# Patient Record
Sex: Female | Born: 1935 | Race: Black or African American | Hispanic: No | Marital: Married
Health system: Southern US, Community
[De-identification: ages and names within clinical notes are randomized; demographics above are authoritative.]

## PROBLEM LIST (undated history)

## (undated) DIAGNOSIS — F32A Depression, unspecified: Secondary | ICD-10-CM

## (undated) DIAGNOSIS — M549 Dorsalgia, unspecified: Secondary | ICD-10-CM

## (undated) DIAGNOSIS — Z923 Personal history of irradiation: Secondary | ICD-10-CM

## (undated) DIAGNOSIS — F329 Major depressive disorder, single episode, unspecified: Secondary | ICD-10-CM

## (undated) DIAGNOSIS — C801 Malignant (primary) neoplasm, unspecified: Secondary | ICD-10-CM

## (undated) DIAGNOSIS — I272 Pulmonary hypertension, unspecified: Secondary | ICD-10-CM

## (undated) DIAGNOSIS — M1990 Unspecified osteoarthritis, unspecified site: Secondary | ICD-10-CM

## (undated) DIAGNOSIS — F419 Anxiety disorder, unspecified: Secondary | ICD-10-CM

## (undated) DIAGNOSIS — I509 Heart failure, unspecified: Secondary | ICD-10-CM

## (undated) DIAGNOSIS — R5383 Other fatigue: Secondary | ICD-10-CM

## (undated) DIAGNOSIS — Z8639 Personal history of other endocrine, nutritional and metabolic disease: Secondary | ICD-10-CM

## (undated) DIAGNOSIS — I10 Essential (primary) hypertension: Secondary | ICD-10-CM

## (undated) DIAGNOSIS — G4733 Obstructive sleep apnea (adult) (pediatric): Secondary | ICD-10-CM

## (undated) DIAGNOSIS — G4700 Insomnia, unspecified: Secondary | ICD-10-CM

## (undated) HISTORY — PX: FOOT SURGERY: SHX648

## (undated) HISTORY — PX: TONSILLECTOMY: SUR1361

## (undated) HISTORY — PX: EYE SURGERY: SHX253

## (undated) HISTORY — PX: BREAST LUMPECTOMY: SHX2

## (undated) HISTORY — PX: DILATION AND CURETTAGE OF UTERUS: SHX78

## (undated) HISTORY — PX: COLONOSCOPY: SHX174

---

## 1999-01-30 ENCOUNTER — Ambulatory Visit (HOSPITAL_COMMUNITY): Admission: RE | Admit: 1999-01-30 | Discharge: 1999-01-30 | Payer: Self-pay | Admitting: *Deleted

## 1999-01-31 ENCOUNTER — Encounter: Admission: RE | Admit: 1999-01-31 | Discharge: 1999-05-01 | Payer: Self-pay | Admitting: *Deleted

## 2001-06-24 ENCOUNTER — Ambulatory Visit (HOSPITAL_COMMUNITY): Admission: RE | Admit: 2001-06-24 | Discharge: 2001-06-24 | Payer: Self-pay | Admitting: Family Medicine

## 2001-06-24 ENCOUNTER — Encounter: Payer: Self-pay | Admitting: Family Medicine

## 2001-06-30 ENCOUNTER — Ambulatory Visit (HOSPITAL_COMMUNITY): Admission: RE | Admit: 2001-06-30 | Discharge: 2001-06-30 | Payer: Self-pay | Admitting: Family Medicine

## 2001-06-30 ENCOUNTER — Encounter: Payer: Self-pay | Admitting: Family Medicine

## 2001-08-09 ENCOUNTER — Other Ambulatory Visit: Admission: RE | Admit: 2001-08-09 | Discharge: 2001-08-09 | Payer: Self-pay | Admitting: Obstetrics and Gynecology

## 2002-01-03 ENCOUNTER — Encounter: Admission: RE | Admit: 2002-01-03 | Discharge: 2002-01-03 | Payer: Self-pay | Admitting: Oncology

## 2002-07-03 ENCOUNTER — Encounter: Payer: Self-pay | Admitting: Family Medicine

## 2002-07-03 ENCOUNTER — Ambulatory Visit (HOSPITAL_COMMUNITY): Admission: RE | Admit: 2002-07-03 | Discharge: 2002-07-03 | Payer: Self-pay | Admitting: Family Medicine

## 2003-01-03 ENCOUNTER — Encounter: Admission: RE | Admit: 2003-01-03 | Discharge: 2003-01-03 | Payer: Self-pay | Admitting: Oncology

## 2003-07-05 ENCOUNTER — Ambulatory Visit (HOSPITAL_COMMUNITY): Admission: RE | Admit: 2003-07-05 | Discharge: 2003-07-05 | Payer: Self-pay | Admitting: Family Medicine

## 2003-07-05 ENCOUNTER — Encounter: Payer: Self-pay | Admitting: Family Medicine

## 2004-01-04 ENCOUNTER — Encounter: Admission: RE | Admit: 2004-01-04 | Discharge: 2004-01-04 | Payer: Self-pay | Admitting: Oncology

## 2004-01-04 ENCOUNTER — Encounter (HOSPITAL_COMMUNITY): Admission: RE | Admit: 2004-01-04 | Discharge: 2004-02-03 | Payer: Self-pay | Admitting: Oncology

## 2004-07-09 ENCOUNTER — Ambulatory Visit (HOSPITAL_COMMUNITY): Admission: RE | Admit: 2004-07-09 | Discharge: 2004-07-09 | Payer: Self-pay | Admitting: Family Medicine

## 2005-01-05 ENCOUNTER — Encounter (HOSPITAL_COMMUNITY): Admission: RE | Admit: 2005-01-05 | Discharge: 2005-02-04 | Payer: Self-pay | Admitting: Oncology

## 2005-01-05 ENCOUNTER — Encounter: Admission: RE | Admit: 2005-01-05 | Discharge: 2005-01-05 | Payer: Self-pay | Admitting: Oncology

## 2005-01-05 ENCOUNTER — Ambulatory Visit (HOSPITAL_COMMUNITY): Payer: Self-pay | Admitting: Oncology

## 2005-07-15 ENCOUNTER — Ambulatory Visit (HOSPITAL_COMMUNITY): Admission: RE | Admit: 2005-07-15 | Discharge: 2005-07-15 | Payer: Self-pay | Admitting: Family Medicine

## 2005-11-17 ENCOUNTER — Ambulatory Visit (HOSPITAL_COMMUNITY): Admission: RE | Admit: 2005-11-17 | Discharge: 2005-11-17 | Payer: Self-pay | Admitting: Ophthalmology

## 2005-12-01 ENCOUNTER — Ambulatory Visit (HOSPITAL_COMMUNITY): Admission: RE | Admit: 2005-12-01 | Discharge: 2005-12-01 | Payer: Self-pay | Admitting: Ophthalmology

## 2005-12-03 ENCOUNTER — Ambulatory Visit (HOSPITAL_COMMUNITY): Admission: RE | Admit: 2005-12-03 | Discharge: 2005-12-03 | Payer: Self-pay | Admitting: General Surgery

## 2006-01-05 ENCOUNTER — Encounter: Admission: RE | Admit: 2006-01-05 | Discharge: 2006-01-05 | Payer: Self-pay | Admitting: Oncology

## 2006-01-05 ENCOUNTER — Ambulatory Visit (HOSPITAL_COMMUNITY): Payer: Self-pay | Admitting: Oncology

## 2006-07-21 ENCOUNTER — Ambulatory Visit (HOSPITAL_COMMUNITY): Admission: RE | Admit: 2006-07-21 | Discharge: 2006-07-21 | Payer: Self-pay | Admitting: Family Medicine

## 2007-01-04 ENCOUNTER — Ambulatory Visit (HOSPITAL_COMMUNITY): Payer: Self-pay | Admitting: Oncology

## 2007-07-27 ENCOUNTER — Ambulatory Visit (HOSPITAL_COMMUNITY): Admission: RE | Admit: 2007-07-27 | Discharge: 2007-07-27 | Payer: Self-pay | Admitting: Obstetrics and Gynecology

## 2008-01-06 ENCOUNTER — Ambulatory Visit (HOSPITAL_COMMUNITY): Payer: Self-pay | Admitting: Oncology

## 2008-07-27 ENCOUNTER — Ambulatory Visit (HOSPITAL_COMMUNITY): Admission: RE | Admit: 2008-07-27 | Discharge: 2008-07-27 | Payer: Self-pay | Admitting: Internal Medicine

## 2008-08-21 ENCOUNTER — Other Ambulatory Visit: Admission: RE | Admit: 2008-08-21 | Discharge: 2008-08-21 | Payer: Self-pay | Admitting: Obstetrics and Gynecology

## 2009-01-04 ENCOUNTER — Ambulatory Visit (HOSPITAL_COMMUNITY): Payer: Self-pay | Admitting: Oncology

## 2009-07-23 ENCOUNTER — Emergency Department (HOSPITAL_COMMUNITY): Admission: EM | Admit: 2009-07-23 | Discharge: 2009-07-23 | Payer: Self-pay | Admitting: Emergency Medicine

## 2009-07-31 ENCOUNTER — Ambulatory Visit (HOSPITAL_COMMUNITY): Admission: RE | Admit: 2009-07-31 | Discharge: 2009-07-31 | Payer: Self-pay | Admitting: Family Medicine

## 2009-11-26 ENCOUNTER — Ambulatory Visit (HOSPITAL_COMMUNITY): Admission: RE | Admit: 2009-11-26 | Discharge: 2009-11-26 | Payer: Self-pay | Admitting: Internal Medicine

## 2009-11-26 ENCOUNTER — Encounter: Payer: Self-pay | Admitting: Orthopedic Surgery

## 2009-12-23 ENCOUNTER — Ambulatory Visit: Payer: Self-pay | Admitting: Orthopedic Surgery

## 2009-12-23 DIAGNOSIS — Z8679 Personal history of other diseases of the circulatory system: Secondary | ICD-10-CM

## 2009-12-23 DIAGNOSIS — M19019 Primary osteoarthritis, unspecified shoulder: Secondary | ICD-10-CM | POA: Insufficient documentation

## 2009-12-23 DIAGNOSIS — M7580 Other shoulder lesions, unspecified shoulder: Secondary | ICD-10-CM

## 2010-08-04 ENCOUNTER — Ambulatory Visit (HOSPITAL_COMMUNITY): Admission: RE | Admit: 2010-08-04 | Discharge: 2010-08-04 | Payer: Self-pay | Admitting: Internal Medicine

## 2010-10-22 ENCOUNTER — Other Ambulatory Visit
Admission: RE | Admit: 2010-10-22 | Discharge: 2010-10-22 | Payer: Self-pay | Source: Home / Self Care | Admitting: Obstetrics and Gynecology

## 2010-12-31 ENCOUNTER — Ambulatory Visit (HOSPITAL_COMMUNITY)
Admission: RE | Admit: 2010-12-31 | Discharge: 2010-12-31 | Payer: Self-pay | Source: Home / Self Care | Attending: Preventative Medicine | Admitting: Preventative Medicine

## 2011-01-20 NOTE — Letter (Signed)
Summary: History form  History form   Imported By: Jacklynn Ganong 12/26/2009 08:18:42  _____________________________________________________________________  External Attachment:    Type:   Image     Comment:   External Document

## 2011-01-20 NOTE — Assessment & Plan Note (Signed)
Summary: RT SHOULDER PAIN/XRAY [facility] 11/26/09/REF [facility]/MEDICARE,[employer]   Vital Signs:  Patient profile:   77 year old female Weight:      144 pounds Pulse rate:   70 / minute Resp:     18 per minute  Vitals Entered By: Fuller Canada MD (December 23, 2009 9:56 AM)  Visit Type:  Initial   Referring Provider:  Dr Felecia Shelling  Primary Provider:  Dr Felecia Shelling   CC:  right shoulder pain x 1 month .  History of Present Illness: I saw Charlotte [name] in the office today for an initial visit.  She is a 77 years old woman with the complaint of:  chief complaint:  Right shoulder pain   pain -duration a month, no injury.  -location anterior shoulder to the deltoid.  -severity [1-10]  9  -worsened by: immobility, not moving.  -improved by:  movement, Tramadol, Tylenol arthritis helps.  -quality: achy pain.  -other symptoms stiffness of the shoulder in the am, no numbness, no catchin gor locking of the shoulder, no weakness of the arm, no popping of the shoulder. ROM is good some pain with raising arm.  -xrays done & where: [facility] 11/26/09 right shoulder.  No injections or therapy for the shoulder.  Meds: Lsinopril, Felodipine, Tramadol, Triamterene/HCTZ, Restasis, Caltrate plus D, Centrum Silver, Fish oil, Aspirin, Zyrtec, Systane.  Allergies (verified): 1)  ! * Bextra 2)  ! Motrin  Past History:  Past Medical History: High blood pressure Shoulder pain  Past Surgical History: Tonsillectomy DNC Lumpectomy right breast Foot surgery Cataract Extraction Colonoscopy  Family History: FH of Cancer:  Family History Coronary Heart Disease female < 17  Social History: Patient is married.  retired Runner, broadcasting/film/video no smoking no alcohol no caffeine  Review of Systems General:  Denies weight loss, weight gain, fever, chills, and fatigue. Cardiac :  Denies chest pain, angina, heart attack, heart failure, poor circulation, blood clots, and phlebitis. Resp:  Complains of cough; denies short of  breath, difficulty breathing, COPD, and pneumonia. GI:  Complains of constipation; denies nausea, vomiting, diarrhea, difficulty swallowing, ulcers, GERD, and reflux. GU:  Denies kidney failure, kidney transplant, kidney stones, burning, poor stream, testicular cancer, blood in urine, and . Neuro:  Complains of headache; denies dizziness, migraines, numbness, weakness, tremor, and unsteady walking. MS:  Complains of joint pain; denies rheumatoid arthritis, joint swelling, gout, bone cancer, osteoporosis, and . Endo:  Complains of thyroid disease; denies goiter and diabetes. Psych:  Denies depression, mood swings, anxiety, panic attack, bipolar, and schizophrenia. Derm:  Denies eczema, cancer, and itching. EENT:  Complains of poor vision and cataracts; denies glaucoma, poor hearing, vertigo, ears ringing, sinusitis, hoarseness, toothaches, and bleeding gums. Immunology:  Complains of seasonal allergies; denies sinus problems and allergic to bee stings. Lymphatic:  Denies lymph node cancer and lymph edema.   Shoulder/Elbow Exam  General:    Well-developed, well-nourished, normal body habitus; no deformities, normal grooming.    Skin:    Intact, no scars, lesions, rashes, cafe au lait spots or bruising:  arms, neck and back   Inspection:    tenderness over the [facility] joint of the right shoulder and the coracoid   no tenderness over the left shoulder [did have deltoid soreness]  neck non tender   Palpation:    as above   Vascular:    Radial, ulnar, brachial, and axillary pulses 2+ and symmetric; capillary refill less than 2 seconds; no evidence of ischemia, clubbing, or cyanosis.    Sensory:  normal sensation in the upper extremities   Motor:    Normal strength in the upper extremities.    Reflexes:    Normal reflexes in the upper extremities.    Shoulder Exam:    Right:    Inspection:  Abnormal    Palpation:  Abnormal    Stability:  stable    Tenderness:  no    Swelling:   no    Erythema:  no    Range of Motion:       Flexion-Active: 180       Extension-Active: 45       Flexion-Passive: 180       Extension-Passive: 45       External Rotation : 45       Interior Rotation : T7    Left:    Inspection:  Normal    Palpation:  Normal    Stability:  stable    Tenderness:  no    Swelling:  no    Erythema:  no    Range of Motion:       Flexion-Active: 180       Extension-Active: 45       Flexion-Passive: 180       Extension-Passive: 45       External Rotation : 45       Interior Rotation : T7  Impingement Sign NEER:    Right positive; Left negative Impingement Sign HAWKINS:    Right positive Apprehension Sign:    Right negative; Left negative AC joint Adduction Test:    Right negative; Left negative   Impression & Recommendations:  Problem # 1:  IMPINGEMENT SYNDROME (ICD-726.2) Assessment New  Right shoulder: Verbal consent obtained/The shoulder was injected with depomedrol 40mg /cc and sensorcaine .25% . There were no complications  HEP Codman's   Orders: New Patient Level III (66440) Joint Aspirate / Injection, Large (20610) Depo- Medrol 40mg  (J1030)  Problem # 2:  SHOULDER, ARTHRITIS, DEGEN./OSTEO (ICD-715.91) Assessment: New  AC JOINT OA on xrays from 11/26/2009  Orders: New Patient Level III (34742)  Patient Instructions: 1)  You have received an injection of cortisone today. You may experience increased pain at the injection site. Apply ice pack to the area for 20 minutes every 2 hours and take 2 xtra strength tylenol every 8 hours. This increased pain will usually resolve in 24 hours. The injection will take effect in 3-10 days.  2)  Please schedule a follow-up appointment as needed.

## 2011-01-21 ENCOUNTER — Other Ambulatory Visit (HOSPITAL_COMMUNITY): Payer: Self-pay | Admitting: Preventative Medicine

## 2011-01-21 DIAGNOSIS — M5416 Radiculopathy, lumbar region: Secondary | ICD-10-CM

## 2011-01-22 ENCOUNTER — Ambulatory Visit (HOSPITAL_COMMUNITY)
Admission: RE | Admit: 2011-01-22 | Discharge: 2011-01-22 | Disposition: A | Discharge status: Home / Self Care | Payer: Medicare Other | Source: Ambulatory Visit | Attending: Preventative Medicine | Admitting: Preventative Medicine

## 2011-01-22 DIAGNOSIS — M79609 Pain in unspecified limb: Secondary | ICD-10-CM | POA: Insufficient documentation

## 2011-01-22 DIAGNOSIS — M5124 Other intervertebral disc displacement, thoracic region: Secondary | ICD-10-CM | POA: Insufficient documentation

## 2011-01-22 DIAGNOSIS — M5416 Radiculopathy, lumbar region: Secondary | ICD-10-CM

## 2011-01-22 DIAGNOSIS — M5126 Other intervertebral disc displacement, lumbar region: Secondary | ICD-10-CM | POA: Insufficient documentation

## 2011-01-22 DIAGNOSIS — M545 Low back pain, unspecified: Secondary | ICD-10-CM | POA: Insufficient documentation

## 2011-01-22 DIAGNOSIS — M25559 Pain in unspecified hip: Secondary | ICD-10-CM | POA: Insufficient documentation

## 2011-01-28 ENCOUNTER — Ambulatory Visit (HOSPITAL_COMMUNITY)
Admission: RE | Admit: 2011-01-28 | Discharge: 2011-01-28 | Disposition: A | Discharge status: Home / Self Care | Payer: Medicare Other | Source: Ambulatory Visit | Attending: Physical Therapy | Admitting: Physical Therapy

## 2011-01-28 DIAGNOSIS — M6281 Muscle weakness (generalized): Secondary | ICD-10-CM | POA: Insufficient documentation

## 2011-01-28 DIAGNOSIS — I10 Essential (primary) hypertension: Secondary | ICD-10-CM | POA: Insufficient documentation

## 2011-01-28 DIAGNOSIS — M545 Low back pain, unspecified: Secondary | ICD-10-CM | POA: Insufficient documentation

## 2011-01-28 DIAGNOSIS — R262 Difficulty in walking, not elsewhere classified: Secondary | ICD-10-CM | POA: Insufficient documentation

## 2011-01-28 DIAGNOSIS — IMO0001 Reserved for inherently not codable concepts without codable children: Secondary | ICD-10-CM | POA: Insufficient documentation

## 2011-01-30 ENCOUNTER — Ambulatory Visit (HOSPITAL_COMMUNITY)
Admission: RE | Admit: 2011-01-30 | Discharge: 2011-01-30 | Disposition: A | Discharge status: Home / Self Care | Payer: Medicare Other | Source: Ambulatory Visit | Attending: Orthopedic Surgery | Admitting: Orthopedic Surgery

## 2011-01-30 DIAGNOSIS — M6281 Muscle weakness (generalized): Secondary | ICD-10-CM | POA: Insufficient documentation

## 2011-01-30 DIAGNOSIS — I10 Essential (primary) hypertension: Secondary | ICD-10-CM | POA: Insufficient documentation

## 2011-01-30 DIAGNOSIS — R262 Difficulty in walking, not elsewhere classified: Secondary | ICD-10-CM | POA: Insufficient documentation

## 2011-01-30 DIAGNOSIS — IMO0001 Reserved for inherently not codable concepts without codable children: Secondary | ICD-10-CM | POA: Insufficient documentation

## 2011-02-03 ENCOUNTER — Ambulatory Visit (HOSPITAL_COMMUNITY): Payer: Medicare Other | Admitting: Physical Therapy

## 2011-02-05 ENCOUNTER — Ambulatory Visit (HOSPITAL_COMMUNITY)
Admission: RE | Admit: 2011-02-05 | Discharge: 2011-02-05 | Disposition: A | Discharge status: Home / Self Care | Payer: Medicare Other | Source: Ambulatory Visit | Attending: *Deleted | Admitting: *Deleted

## 2011-02-10 ENCOUNTER — Ambulatory Visit (HOSPITAL_COMMUNITY)
Admission: RE | Admit: 2011-02-10 | Discharge: 2011-02-10 | Disposition: A | Discharge status: Home / Self Care | Payer: Medicare Other | Source: Ambulatory Visit | Attending: *Deleted | Admitting: *Deleted

## 2011-02-12 ENCOUNTER — Ambulatory Visit (HOSPITAL_COMMUNITY)
Admission: RE | Admit: 2011-02-12 | Discharge: 2011-02-12 | Disposition: A | Discharge status: Home / Self Care | Payer: Medicare Other | Source: Ambulatory Visit | Attending: *Deleted | Admitting: *Deleted

## 2011-03-28 LAB — BASIC METABOLIC PANEL
Calcium: 9.6 mg/dL (ref 8.4–10.5)
Chloride: 95 mEq/L — ABNORMAL LOW (ref 96–112)
Creatinine, Ser: 1.23 mg/dL — ABNORMAL HIGH (ref 0.4–1.2)
GFR calc Af Amer: 51 mL/min — ABNORMAL LOW (ref >60)

## 2011-03-28 LAB — POCT CARDIAC MARKERS
CKMB, poc: 1.3 ng/mL (ref 1.0–8.0)
CKMB, poc: 1.8 ng/mL (ref 1.0–8.0)
Myoglobin, poc: 86.3 ng/mL (ref 12–200)

## 2011-03-28 LAB — PROTIME-INR: INR: 0.9 (ref 0.00–1.49)

## 2011-03-28 LAB — DIFFERENTIAL
Lymphocytes Relative: 48 % — ABNORMAL HIGH (ref 12–46)
Lymphs Abs: 2.9 10*3/uL (ref 0.7–4.0)
Monocytes Relative: 7 % (ref 3–12)
Neutro Abs: 2.5 10*3/uL (ref 1.7–7.7)
Neutrophils Relative %: 42 % — ABNORMAL LOW (ref 43–77)

## 2011-03-28 LAB — APTT: aPTT: 28 seconds (ref 24–37)

## 2011-03-28 LAB — CBC
RBC: 3.76 MIL/uL — ABNORMAL LOW (ref 3.87–5.11)
WBC: 6 10*3/uL (ref 4.0–10.5)

## 2011-05-08 NOTE — H&P (Signed)
NAME:  Charlotte [name], Charlotte [name]                 ACCOUNT NO.:  [id number]   MEDICAL RECORD NO.:  [id number]          PATIENT TYPE:  AMB   LOCATION:  DAY                           FACILITY:  [facility]   PHYSICIAN:  Jerolyn Shin C. Katrinka Blazing, M.D.   DATE OF BIRTH:  [birth year]   DATE OF ADMISSION:  DATE OF DISCHARGE:  LH                                HISTORY & PHYSICAL   HISTORY OF PRESENT ILLNESS:  The patient is a 77 year old female with a  history of colon polyps, status post polypectomy in 2001.  Her sister has  colonoscopy cancer.  The patient is scheduled for followup colonoscopy and  screening for colon polyps.  She denies any rectal bleeding.  She denies  abdominal pain.  There has been on change in bowel habits.   PAST MEDICAL HISTORY:  1.  Hypertension.  2.  Right breast cancer.  3.  Hyperlipidemia.   MEDICATIONS:  1.  Mexil 55 daily.  2.  Maxzide 75/50 daily.  3.  Daily aspirin 81 mg.  4.  Multivitamins.   PAST SURGICAL HISTORY:  1.  Right partial mastectomy.  2.  Tonsillectomy.  3.  Bilateral bunion surgery.  4.  Bilateral cataract surgery.   ALLERGIES:  BEXTRA which caused a swelling and MOTRIN which causes GI upset.   PHYSICAL EXAMINATION:  VITAL SIGNS:  Blood pressure 160/82, pulse 68,  respirations 20, weight 145 pounds.  HEENT:  Unremarkable.  NECK:  Supple.  No JVD, bruits, adenopathy, or thyromegaly.  CHEST:  Clear to auscultation.  HEART:  Regular rate and rhythm without murmur, gallop or rub.  ABDOMEN:  Soft, nontender.  No masses.  EXTREMITIES:  No cyanosis, clubbing or edema.  NEUROLOGIC:  No focal motor, sensory or cerebellar deficits.   IMPRESSION:  1.  History of multiple colon polyps.  2.  Family history of colon cancer.  3.  Hypertension.  4.  Anemia.   PLAN:  Screening colonoscopy.      Dirk Dress. Katrinka Blazing, M.D.  Electronically Signed     LCS/MEDQ  D:  12/03/2005  T:  12/03/2005  Job:  [id number]   cc:   [facility]  Fax: [phone]

## 2011-07-07 ENCOUNTER — Other Ambulatory Visit (HOSPITAL_COMMUNITY): Payer: Self-pay | Admitting: Internal Medicine

## 2011-07-07 DIAGNOSIS — Z139 Encounter for screening, unspecified: Secondary | ICD-10-CM

## 2011-08-10 ENCOUNTER — Ambulatory Visit (HOSPITAL_COMMUNITY)
Admission: RE | Admit: 2011-08-10 | Discharge: 2011-08-10 | Disposition: A | Discharge status: Home / Self Care | Payer: Medicare Other | Source: Ambulatory Visit | Attending: Internal Medicine | Admitting: Internal Medicine

## 2011-08-10 DIAGNOSIS — Z139 Encounter for screening, unspecified: Secondary | ICD-10-CM

## 2011-08-10 DIAGNOSIS — Z1231 Encounter for screening mammogram for malignant neoplasm of breast: Secondary | ICD-10-CM | POA: Insufficient documentation

## 2011-08-13 NOTE — Op Note (Signed)
  NAME:  Charlotte [name], Charlotte [name]                 ACCOUNT NO.:  [id number]  MEDICAL RECORD NO.:  [id number]  LOCATION:                                 FACILITY:  PHYSICIAN:  Arissa Fagin H. Pollyann Kennedy, MD     DATE OF BIRTH:  07/06/1928  DATE OF PROCEDURE:  08/10/2011 DATE OF DISCHARGE:                              OPERATIVE REPORT   PREOPERATIVE DIAGNOSIS:  Nasal septal deviation.  POSTOPERATIVE DIAGNOSIS:  Nasal septal deviation.  PROCEDURE:  Nasal septoplasty.  SURGEON:  Manahil Vanzile H. Pollyann Kennedy, MD.  ANESTHESIA:  General endotracheal anesthesia was used.  COMPLICATIONS:  No complications.  BLOOD LOSS:  Minimal.  FINDINGS:  Severe leftward septal deviation with extension of the quadrangular cartilage over a leftward banding maxillary crest.  A large ethmoid bony spur posteriorly on the left side and a cartilaginous spur on the right side anteriorly adjacent to the right side of the maxillary crest.  REFERRING PHYSICIAN:  Corwin Levins, [facility], [facility]  HISTORY:  This is a 77 year old with a history of nasal obstruction. Risks, benefits, alternatives, and complications of procedure were explained to the patient and her mother seemed to understand and agreed to surgery.  PROCEDURE:  The patient was taken to the operating room, placed on the operating table in supine position.  Following induction of general endotracheal anesthesia, the table was positioned and the patient was prepped and draped in standard fashion.  Afrin spray was used preoperatively in the nasal cavities.  1% Xylocaine with epinephrine was infiltrated into the septum, columella, and the floor of the nasal cavity on the left.  Afrin pledgets were placed bilaterally.  A left hemitransfixion incision was used to approach the septal cartilage and mucoperichondrial flap was developed posteriorly down the left side. There was some difficulty in getting around the severe spurring on the left and a mucosal tear did occur but  corresponding mucosa on the right side was intact.  The bony cartilaginous junction was divided and a mucosal flap was developed on the right side posteriorly.  The ethmoid bone was resected in areas that were deviated.  The left maxillary crest was taken down using a 4-mm osteotome.  The cartilaginous spur on both sides was shaved down using a scalpel.  These maneuvers greatly enhanced the positioning of the septum and allowed it to lie straight and flat in the midline with no further obstruction.  The mucosal incision was reapproximated with 4-0 chromic suture.  A 4-0 plain gut quilting suture was used to quilt the septal flaps.  The nasal cavities were packed with rolled up Telfa coated with bacitracin ointment and the pharynx suctioned of blood and secretions.  The patient was awakened, extubated, and transferred to recovery in stable condition.     Ledger Heindl H. Pollyann Kennedy, MD     JHR/MEDQ  D:  08/10/2011  T:  08/10/2011  Job:  [id number]  cc:   Corwin Levins, MD  Electronically Signed by Serena Colonel MD on 08/13/2011 01:09:09 PM

## 2012-02-02 ENCOUNTER — Emergency Department (HOSPITAL_COMMUNITY)
Admission: EM | Admit: 2012-02-02 | Discharge: 2012-02-02 | Disposition: A | Discharge status: Home / Self Care | Payer: Medicare Other | Attending: Emergency Medicine | Admitting: Emergency Medicine

## 2012-02-02 ENCOUNTER — Emergency Department (HOSPITAL_COMMUNITY): Payer: Medicare Other

## 2012-02-02 ENCOUNTER — Encounter (HOSPITAL_COMMUNITY): Payer: Self-pay | Admitting: *Deleted

## 2012-02-02 ENCOUNTER — Other Ambulatory Visit: Payer: Self-pay

## 2012-02-02 DIAGNOSIS — Z79899 Other long term (current) drug therapy: Secondary | ICD-10-CM | POA: Insufficient documentation

## 2012-02-02 DIAGNOSIS — IMO0001 Reserved for inherently not codable concepts without codable children: Secondary | ICD-10-CM | POA: Diagnosis not present

## 2012-02-02 DIAGNOSIS — I10 Essential (primary) hypertension: Secondary | ICD-10-CM | POA: Diagnosis not present

## 2012-02-02 DIAGNOSIS — M545 Low back pain: Secondary | ICD-10-CM | POA: Diagnosis not present

## 2012-02-02 DIAGNOSIS — M25519 Pain in unspecified shoulder: Secondary | ICD-10-CM | POA: Insufficient documentation

## 2012-02-02 DIAGNOSIS — R079 Chest pain, unspecified: Secondary | ICD-10-CM | POA: Diagnosis not present

## 2012-02-02 DIAGNOSIS — M25559 Pain in unspecified hip: Secondary | ICD-10-CM | POA: Diagnosis not present

## 2012-02-02 DIAGNOSIS — M7918 Myalgia, other site: Secondary | ICD-10-CM

## 2012-02-02 DIAGNOSIS — M546 Pain in thoracic spine: Secondary | ICD-10-CM | POA: Diagnosis not present

## 2012-02-02 HISTORY — DX: Essential (primary) hypertension: I10

## 2012-02-02 MED ORDER — TRAMADOL-ACETAMINOPHEN 37.5-325 MG PO TABS: ORAL_TABLET | ORAL | Status: AC

## 2012-02-02 MED ORDER — PREDNISONE 20 MG PO TABS: ORAL_TABLET | ORAL | Status: DC

## 2012-02-02 MED ORDER — METHOCARBAMOL 500 MG PO TABS
750.0000 mg | ORAL_TABLET | Freq: Three times a day (TID) | ORAL | Status: DC
  Administered 2012-02-02: 750 mg via ORAL
  Filled 2012-02-02: qty 2

## 2012-02-02 MED ORDER — METHOCARBAMOL 750 MG PO TABS: 750.0000 mg | ORAL_TABLET | Freq: Four times a day (QID) | ORAL | Status: DC

## 2012-02-02 MED ORDER — FAMOTIDINE 20 MG PO TABS
20.0000 mg | ORAL_TABLET | Freq: Once | ORAL | Status: AC
  Administered 2012-02-02: 20 mg via ORAL
  Filled 2012-02-02: qty 1

## 2012-02-02 MED ORDER — PREDNISONE 20 MG PO TABS
60.0000 mg | ORAL_TABLET | Freq: Once | ORAL | Status: AC
  Administered 2012-02-02: 60 mg via ORAL
  Filled 2012-02-02: qty 3

## 2012-02-02 NOTE — Discharge Instructions (Signed)
Have your husband check your back every day for a rash, if he sees a rash he should call Dr. Felecia Shelling to get you started on medications for shingles. The prednisone and I started you on today is also helpful for shingles. Take the muscle relaxer, Robaxin, and the Ultracet for pain. The Ultracet has tramadol in it, so don't take any extra tramadol all you are taking the Ultracet. Recheck if you get fever, cough, shortness of breath, you feel worse.

## 2012-02-02 NOTE — ED Notes (Signed)
C/o pain under right shoulder blade radiating down right arm that started yesterday.  Denies injury.  Denies sob/n/v/d.

## 2012-02-02 NOTE — ED Provider Notes (Signed)
History   This chart was scribed for Ward Givens, MD by Clarita Crane. The patient was seen in room [facility]. Patient's care was started at 0914.  CSN: [id number]  Arrival date & time 02/02/12  9147   First MD Initiated Contact with Patient 02/02/12 [phone]      Chief Complaint  Patient presents with  . Shoulder Pain    (Consider location/radiation/quality/duration/timing/severity/associated sxs/prior treatment) HPI Charlotte [name] is a 78 y.o. female who presents to the Emergency Department complaining of waxing and waning moderate upper back pain localized to region medial of right lower scapula onset yesterday and worsening since. Patient describes pain as aching and states pain is aggravated by movement of upper extremities. Denies recent increased use of upper extremities. States pain is not aggravated by coughing, deep breathing. Denies n/v, coughing, fever and previous history of similar symptoms. Patient with h/o HTN  PCP Dr Felecia Shelling  Past Medical History  Diagnosis Date  . Hypertension     Past Surgical History  Procedure Date  . Breast lumpectomy     right side  . Tonsillectomy   . Dilation and curettage of uterus     No family history on file.  History  Substance Use Topics  . Smoking status: Never Smoker   . Smokeless tobacco: Not on file  . Alcohol Use: No  lives with spouse at home.   OB History    Grav Para Term Preterm Abortions TAB SAB Ect Mult Living                  Review of Systems 10 Systems reviewed and are negative for acute change except as noted in the HPI.  Allergies  Latex and Bextra  Home Medications   Current Outpatient Rx  Name Route Sig Dispense Refill  . ASPIRIN EC 81 MG PO TBEC Oral Take 81 mg by mouth daily.    Marland Kitchen CALTRATE 600+D PO Oral Take 1 tablet by mouth daily.    . CYCLOSPORINE 0.05 % OP EMUL Both Eyes Place 1 drop into both eyes 2 (two) times daily.    Marland Kitchen FELODIPINE ER 5 MG PO TB24 Oral Take 5 mg by mouth daily.    Marland Kitchen  LISINOPRIL 20 MG PO TABS Oral Take 20 mg by mouth daily.    . CENTRUM PO Oral Take 1 tablet by mouth daily.    Marland Kitchen FISH OIL TRIPLE STRENGTH 1400 MG PO CAPS Oral Take 1 tablet by mouth daily.    Frazier Butt OP Ophthalmic Apply 1 drop to eye 4 (four) times daily.    . TRAMADOL HCL 50 MG PO TABS Oral Take 50 mg by mouth every 6 (six) hours as needed. Pain    . TRIAMTERENE-HCTZ 75-50 MG PO TABS Oral Take 1 tablet by mouth daily.      BP 178/58  Pulse 73  Temp(Src) 98.6 F (37 C) (Oral)  Resp 16  Ht 5\' 3"  (1.6 m)  Wt 145 lb (65.772 kg)  BMI 25.69 kg/m2  SpO2 97%  Vital signs normal    Physical Exam  Nursing note and vitals reviewed. Constitutional: She is oriented to person, place, and time. She appears well-developed and well-nourished. No distress.  HENT:  Head: Normocephalic and atraumatic.  Left Ear: External ear normal.  Nose: Nose normal.  Mouth/Throat: Oropharynx is clear and moist.  Eyes: Conjunctivae and EOM are normal. Pupils are equal, round, and reactive to light.  Neck: Normal range of motion. Neck supple. No tracheal  deviation present.  Cardiovascular: Normal rate, regular rhythm and normal heart sounds.  Exam reveals no gallop and no friction rub.   No murmur heard. Pulmonary/Chest: Effort normal and breath sounds normal. No respiratory distress. She has no wheezes. She has no rales. She exhibits tenderness.  Abdominal: Soft. She exhibits no distension.  Musculoskeletal: Normal range of motion. She exhibits no edema.       Entire spine non-tender.  tender to palpation in the Thoracic region medial to the lower scapula bilaterally R>L No rash seen   Neurological: She is alert and oriented to person, place, and time. No sensory deficit.  Skin: Skin is warm and dry. No rash noted.  Psychiatric: She has a normal mood and affect. Her behavior is normal.    ED Course  Procedures (including critical care time)  DIAGNOSTIC STUDIES: Oxygen Saturation is 97% on room air,  normal by my interpretation.    COORDINATION OF CARE: 9:42AM- Patient informed of current plan for treatment and evaluation and agrees with plan at this time.  11:15AM- Patient informed of imaging results. States pain has improved but is still present. Notes pain feels like that of a spasm and is radiating to right side from right scapular region.   Labs Reviewed - No data to display Dg Chest 2 View  02/02/2012  *RADIOLOGY REPORT*  Clinical Data: 78 year old female with pain in the posterior chest, right shoulder.  CHEST - 2 VIEW  Comparison: 07/23/2009.  Findings: Stable mild cardiomegaly. Other mediastinal contours are within normal limits.  Visualized tracheal air column is within normal limits.  No pneumothorax, pulmonary edema, pleural effusion or confluent pulmonary opacity.  Mild scoliosis. No acute osseous abnormality identified.  IMPRESSION: No acute cardiopulmonary abnormality.  Original Report Authenticated By: Harley Hallmark, M.D.    Date: 02/02/2012  Rate: 71  Rhythm: normal sinus rhythm  QRS Axis: normal  Intervals: normal  ST/T Wave abnormalities: normal  Conduction Disutrbances:LVH  Narrative Interpretation: PAC's  Old EKG Reviewed: unchanged from 07/23/2009  Diagnoses that have been ruled out:  None  Diagnoses that are still under consideration:  None  Final diagnoses:  Musculoskeletal pain   New Prescriptions   METHOCARBAMOL (ROBAXIN) 750 MG TABLET    Take 1 tablet (750 mg total) by mouth 4 (four) times daily.   PREDNISONE (DELTASONE) 20 MG TABLET    Take 3 po QD x 2d starting tomorrow, then 2 po QD x 3d then 1 po QD x 3d   TRAMADOL-ACETAMINOPHEN (ULTRACET) 37.5-325 MG PER TABLET    2 tabs po QID prn pain   Plan discharge Devoria Albe, MD, FACEP      MDM     I personally performed the services described in this documentation, which was scribed in my presence. The recorded information has been reviewed and considered.  Devoria Albe, MD, [facility]       Ward Givens, MD 02/02/12 [phone]

## 2012-06-13 DIAGNOSIS — R7309 Other abnormal glucose: Secondary | ICD-10-CM | POA: Diagnosis not present

## 2012-06-13 DIAGNOSIS — E109 Type 1 diabetes mellitus without complications: Secondary | ICD-10-CM | POA: Diagnosis not present

## 2012-06-13 DIAGNOSIS — I10 Essential (primary) hypertension: Secondary | ICD-10-CM | POA: Diagnosis not present

## 2012-06-13 DIAGNOSIS — M549 Dorsalgia, unspecified: Secondary | ICD-10-CM | POA: Diagnosis not present

## 2012-06-13 DIAGNOSIS — J309 Allergic rhinitis, unspecified: Secondary | ICD-10-CM | POA: Diagnosis not present

## 2012-07-06 ENCOUNTER — Other Ambulatory Visit (HOSPITAL_COMMUNITY): Payer: Self-pay | Admitting: Internal Medicine

## 2012-07-06 DIAGNOSIS — Z139 Encounter for screening, unspecified: Secondary | ICD-10-CM

## 2012-08-18 ENCOUNTER — Ambulatory Visit (HOSPITAL_COMMUNITY)
Admission: RE | Admit: 2012-08-18 | Discharge: 2012-08-18 | Disposition: A | Discharge status: Home / Self Care | Payer: Medicare Other | Source: Ambulatory Visit | Attending: Internal Medicine | Admitting: Internal Medicine

## 2012-08-18 DIAGNOSIS — Z1231 Encounter for screening mammogram for malignant neoplasm of breast: Secondary | ICD-10-CM | POA: Diagnosis not present

## 2012-08-18 DIAGNOSIS — Z139 Encounter for screening, unspecified: Secondary | ICD-10-CM

## 2012-09-12 DIAGNOSIS — Z23 Encounter for immunization: Secondary | ICD-10-CM | POA: Diagnosis not present

## 2012-09-12 DIAGNOSIS — I10 Essential (primary) hypertension: Secondary | ICD-10-CM | POA: Diagnosis not present

## 2012-11-04 DIAGNOSIS — M549 Dorsalgia, unspecified: Secondary | ICD-10-CM | POA: Diagnosis not present

## 2012-11-09 DIAGNOSIS — H04129 Dry eye syndrome of unspecified lacrimal gland: Secondary | ICD-10-CM | POA: Diagnosis not present

## 2012-11-09 DIAGNOSIS — Z961 Presence of intraocular lens: Secondary | ICD-10-CM | POA: Diagnosis not present

## 2012-12-12 DIAGNOSIS — R5381 Other malaise: Secondary | ICD-10-CM | POA: Diagnosis not present

## 2012-12-12 DIAGNOSIS — R42 Dizziness and giddiness: Secondary | ICD-10-CM | POA: Diagnosis not present

## 2012-12-12 DIAGNOSIS — I10 Essential (primary) hypertension: Secondary | ICD-10-CM | POA: Diagnosis not present

## 2013-03-13 DIAGNOSIS — I10 Essential (primary) hypertension: Secondary | ICD-10-CM | POA: Diagnosis not present

## 2013-03-13 DIAGNOSIS — M1990 Unspecified osteoarthritis, unspecified site: Secondary | ICD-10-CM | POA: Diagnosis not present

## 2013-06-15 ENCOUNTER — Encounter (HOSPITAL_COMMUNITY): Payer: Self-pay | Admitting: *Deleted

## 2013-06-15 ENCOUNTER — Emergency Department (HOSPITAL_COMMUNITY): Payer: Medicare Other

## 2013-06-15 ENCOUNTER — Emergency Department (HOSPITAL_COMMUNITY)
Admission: EM | Admit: 2013-06-15 | Discharge: 2013-06-15 | Disposition: A | Discharge status: Home / Self Care | Payer: Medicare Other | Attending: Emergency Medicine | Admitting: Emergency Medicine

## 2013-06-15 DIAGNOSIS — Z7982 Long term (current) use of aspirin: Secondary | ICD-10-CM | POA: Insufficient documentation

## 2013-06-15 DIAGNOSIS — IMO0002 Reserved for concepts with insufficient information to code with codable children: Secondary | ICD-10-CM | POA: Insufficient documentation

## 2013-06-15 DIAGNOSIS — E876 Hypokalemia: Secondary | ICD-10-CM

## 2013-06-15 DIAGNOSIS — J9819 Other pulmonary collapse: Secondary | ICD-10-CM | POA: Diagnosis not present

## 2013-06-15 DIAGNOSIS — Z79899 Other long term (current) drug therapy: Secondary | ICD-10-CM | POA: Insufficient documentation

## 2013-06-15 DIAGNOSIS — I10 Essential (primary) hypertension: Secondary | ICD-10-CM | POA: Insufficient documentation

## 2013-06-15 DIAGNOSIS — Z91040 Latex allergy status: Secondary | ICD-10-CM | POA: Insufficient documentation

## 2013-06-15 DIAGNOSIS — R071 Chest pain on breathing: Secondary | ICD-10-CM | POA: Diagnosis not present

## 2013-06-15 DIAGNOSIS — R0789 Other chest pain: Secondary | ICD-10-CM | POA: Insufficient documentation

## 2013-06-15 LAB — BASIC METABOLIC PANEL
Calcium: 9.9 mg/dL (ref 8.4–10.5)
GFR calc Af Amer: 71 mL/min — ABNORMAL LOW (ref >90)
GFR calc non Af Amer: 61 mL/min — ABNORMAL LOW (ref >90)
Sodium: 132 mEq/L — ABNORMAL LOW (ref 135–145)

## 2013-06-15 LAB — CBC
MCH: 29 pg (ref 26.0–34.0)
MCHC: 34.1 g/dL (ref 30.0–36.0)
Platelets: 333 10*3/uL (ref 150–400)
RDW: 14.8 % (ref 11.5–15.5)

## 2013-06-15 LAB — TROPONIN I
Troponin I: <0.3 ng/mL (ref <0.30)
Troponin I: <0.3 ng/mL (ref <0.30)

## 2013-06-15 MED ORDER — POTASSIUM CHLORIDE CRYS ER 20 MEQ PO TBCR
40.0000 meq | EXTENDED_RELEASE_TABLET | Freq: Once | ORAL | Status: AC
  Administered 2013-06-15: 40 meq via ORAL
  Filled 2013-06-15: qty 2

## 2013-06-15 MED ORDER — ACETAMINOPHEN 325 MG PO TABS
650.0000 mg | ORAL_TABLET | Freq: Once | ORAL | Status: AC
  Administered 2013-06-15: 650 mg via ORAL
  Filled 2013-06-15: qty 2

## 2013-06-15 MED ORDER — POTASSIUM CHLORIDE CRYS ER 20 MEQ PO TBCR: 20.0000 meq | EXTENDED_RELEASE_TABLET | Freq: Two times a day (BID) | ORAL | Status: DC

## 2013-06-15 NOTE — ED Notes (Signed)
Patient with no complaints at this time. Respirations even and unlabored. Skin warm/dry. Discharge instructions reviewed with patient at this time. Patient given opportunity to voice concerns/ask questions. IV removed per policy and band-aid applied to site. Patient discharged at this time and left Emergency Department with steady gait.  

## 2013-06-15 NOTE — ED Notes (Signed)
Patient to restroom. No distress.

## 2013-06-15 NOTE — ED Provider Notes (Signed)
History     This chart was scribed for Charlotte Melter, MD by Jiles Prows, ED Scribe. The patient was seen in room APA04/APA04 and the patient's care was started at 8:14 AM.  CSN: [id number] Arrival date & time 06/15/13  0802  Chief Complaint  Patient presents with  . Chest Pain   The history is provided by the patient and medical records. No language interpreter was used.   HPI Comments: Charlotte [name] is a 79 y.o. female with a h/o HTN who presents to the Emergency Department complaining of mild to moderate pain in left-sided chest onset 4am.  Pt describes this pain as burning and tender.  She reports touching the area exacerbates the pain.  She states that she has experienced this pain before, and the ED DR thought she may have been coming down with shingles.  She denies worse pain with deep breathing.  Pt reports she has not had anything to eat today.  Pt states her bp was 195/72 this morning.  She figures the pain from her chest was causing this spike in bp.  She reports that the pain is currently resolved to a 2-3/10.  Pt reports that at it's worst, the pain was a 9/10.  Pt denies headache, diaphoresis, fever, chills, nausea, vomiting, diarrhea, weakness, cough, SOB and any other pain.   Past Medical History  Diagnosis Date  . Hypertension    Past Surgical History  Procedure Laterality Date  . Breast lumpectomy      right side  . Tonsillectomy    . Dilation and curettage of uterus     No family history on file. History  Substance Use Topics  . Smoking status: Never Smoker   . Smokeless tobacco: Not on file  . Alcohol Use: No   OB History   Grav Para Term Preterm Abortions TAB SAB Ect Mult Living                 Review of Systems  Cardiovascular: Positive for chest pain.  All other systems reviewed and are negative.    Allergies  Latex and Bextra  Home Medications   Current Outpatient Rx  Name  Route  Sig  Dispense  Refill  . aspirin EC 81 MG tablet   Oral    Take 81 mg by mouth daily.         . Calcium Carbonate-Vitamin D (CALTRATE 600+D PO)   Oral   Take 1 tablet by mouth daily.         . cycloSPORINE (RESTASIS) 0.05 % ophthalmic emulsion   Both Eyes   Place 1 drop into both eyes 2 (two) times daily.         . felodipine (PLENDIL) 5 MG 24 hr tablet   Oral   Take 5 mg by mouth daily.         . fluticasone (FLONASE) 50 MCG/ACT nasal spray   Nasal   Place 2 sprays into the nose daily as needed for allergies.         . hydrochlorothiazide (HYDRODIURIL) 25 MG tablet   Oral   Take 25 mg by mouth daily.         Marland Kitchen losartan (COZAAR) 50 MG tablet   Oral   Take 50 mg by mouth daily.         . Multiple Vitamins-Minerals (CENTRUM PO)   Oral   Take 1 tablet by mouth daily.         . Omega-3  Fatty Acids (FISH OIL TRIPLE STRENGTH) 1400 MG CAPS   Oral   Take 1 tablet by mouth daily.         Bertram Gala Glycol-Propyl Glycol (SYSTANE OP)   Ophthalmic   Apply 1 drop to eye 4 (four) times daily.         . potassium chloride SA (K-DUR,KLOR-CON) 20 MEQ tablet   Oral   Take 1 tablet (20 mEq total) by mouth 2 (two) times daily.   6 tablet   0    BP 189/72  Temp(Src) 98.7 F (37.1 C) (Oral)  Resp 20  SpO2 96% Physical Exam  Nursing note and vitals reviewed. Constitutional: She is oriented to person, place, and time. She appears well-developed and well-nourished.  HENT:  Head: Normocephalic and atraumatic.  Eyes: Conjunctivae and EOM are normal. Pupils are equal, round, and reactive to light.  Neck: Normal range of motion and phonation normal. Neck supple.  Cardiovascular: Normal rate, regular rhythm, normal heart sounds and intact distal pulses.  Exam reveals no gallop and no friction rub.   No murmur heard. Pulmonary/Chest: Effort normal and breath sounds normal. No respiratory distress. She has no wheezes. She has no rales. She exhibits tenderness.  Diffuse left anterior tenderness.   Abdominal: Soft. She  exhibits no distension. There is no tenderness. There is no guarding.  Musculoskeletal: Normal range of motion.  Neurological: She is alert and oriented to person, place, and time. She has normal strength. She exhibits normal muscle tone.  Skin: Skin is warm and dry.  Psychiatric: She has a normal mood and affect. Her behavior is normal. Judgment and thought content normal.    ED Course  Procedures (including critical care time) No data found.  Medications  acetaminophen (TYLENOL) tablet 650 mg (650 mg Oral Given 06/15/13 0825)  potassium chloride SA (K-DUR,KLOR-CON) CR tablet 40 mEq (40 mEq Oral Given 06/15/13 1002)   Medications  acetaminophen (TYLENOL) tablet 650 mg (650 mg Oral Given 06/15/13 0825)  potassium chloride SA (K-DUR,KLOR-CON) CR tablet 40 mEq (40 mEq Oral Given 06/15/13 1002)     Date: 06/15/13  Rate: 84  Rhythm: normal sinus rhythm  QRS Axis: normal  PR and QT Intervals: normal  ST/T Wave abnormalities: normal  PR and QRS Conduction Disutrbances:none  Narrative Interpretation: Abnormal QRS-T angle- consider primary T wave abnormality  Old EKG Reviewed: unchanged- traditional parameters, since 02/02/12  8:19 AM - Discussed ED treatment with pt at bedside including tylenol and tests on heart and lungs and pt agrees.   9:24 AM - Recheck.  Pt reports that some discomfort has been relieved.  Reported EKG is slightly abnormal.  Informed pt she may need to follow up with cardiologist.  Will recheck blood work in about 2 hours.  Labs Reviewed  BASIC METABOLIC PANEL - Abnormal; Notable for the following:    Sodium 132 (*)    Potassium 2.7 (*)    Chloride 87 (*)    Glucose, Bld 128 (*)    GFR calc non Af Amer 61 (*)    GFR calc Af Amer 71 (*)    All other components within normal limits  CBC  TROPONIN I  TROPONIN I   Dg Chest Port 1 View  06/15/2013   *RADIOLOGY REPORT*  Clinical Data: Chest pain  PORTABLE CHEST - 1 VIEW  Comparison: 02/02/2012  Findings: The heart  size appears normal.  The lung volumes appeared decreased and there is mild atelectasis in both lung bases.  Hazy  opacity in the left base is noted which may represent early infiltrate.  IMPRESSION:  1.  Low lung volumes and bibasilar atelectasis. 2.  Hazy left lung opacity.  If there is a concern for pneumonia recommend further evaluation with dedicated upright PA and lateral chest radiograph   Original Report Authenticated By: Signa Kell, M.D.   1. Chest pain, atypical   2. Hypokalemia     MDM  Nonspecific chest pain with somewhat abnormal EKG, possibly indicating a T wave abnormality. Delta troponin evaluation; is negative. CP is atypical for coronary source. Doubt ACS, PE or PNE. Incidential hypokalemia, not on diuretics.  Doubt metabolic instability, serious bacterial infection or impending vascular collapse; the patient is stable for discharge.  Nursing Notes Reviewed/ Care Coordinated Applicable Imaging Reviewed Interpretation of Laboratory Data incorporated into ED treatment  Plan: Home Medications- Potattium; Home Treatments- usual medications; return here if the recommended treatment, does not improve the symptoms; Recommended follow up- PCP 1 week for check up and referral to Cardiology     I personally performed the services described in this documentation, which was scribed in my presence. The recorded information has been reviewed and is accurate.         Charlotte Melter, MD 06/16/13 [phone]

## 2013-06-15 NOTE — ED Notes (Signed)
C/o sudden onset of mid cp waking pt up this morning.  Describes pain as burning.  Denies sob/n/v/weakness.  States chest is sore to touch.

## 2013-06-15 NOTE — ED Notes (Signed)
RN called lab about pending lab work that appeared to not be in process, blood work sent at [facility]. Lab to check on status. Spoke with [employer].

## 2013-06-26 DIAGNOSIS — E876 Hypokalemia: Secondary | ICD-10-CM | POA: Diagnosis not present

## 2013-06-26 DIAGNOSIS — I10 Essential (primary) hypertension: Secondary | ICD-10-CM | POA: Diagnosis not present

## 2013-07-03 DIAGNOSIS — I10 Essential (primary) hypertension: Secondary | ICD-10-CM | POA: Diagnosis not present

## 2013-07-10 ENCOUNTER — Other Ambulatory Visit (HOSPITAL_COMMUNITY): Payer: Self-pay | Admitting: Internal Medicine

## 2013-07-10 DIAGNOSIS — Z139 Encounter for screening, unspecified: Secondary | ICD-10-CM

## 2013-08-22 ENCOUNTER — Ambulatory Visit (HOSPITAL_COMMUNITY)
Admission: RE | Admit: 2013-08-22 | Discharge: 2013-08-22 | Disposition: A | Discharge status: Home / Self Care | Payer: Medicare Other | Source: Ambulatory Visit | Attending: Internal Medicine | Admitting: Internal Medicine

## 2013-08-22 DIAGNOSIS — Z139 Encounter for screening, unspecified: Secondary | ICD-10-CM

## 2013-08-22 DIAGNOSIS — Z1231 Encounter for screening mammogram for malignant neoplasm of breast: Secondary | ICD-10-CM | POA: Insufficient documentation

## 2013-10-02 DIAGNOSIS — Z23 Encounter for immunization: Secondary | ICD-10-CM | POA: Diagnosis not present

## 2013-10-02 DIAGNOSIS — I10 Essential (primary) hypertension: Secondary | ICD-10-CM | POA: Diagnosis not present

## 2013-11-22 DIAGNOSIS — H04129 Dry eye syndrome of unspecified lacrimal gland: Secondary | ICD-10-CM | POA: Diagnosis not present

## 2013-11-22 DIAGNOSIS — Z961 Presence of intraocular lens: Secondary | ICD-10-CM | POA: Diagnosis not present

## 2014-01-08 DIAGNOSIS — I10 Essential (primary) hypertension: Secondary | ICD-10-CM | POA: Diagnosis not present

## 2014-01-11 DIAGNOSIS — E7800 Pure hypercholesterolemia, unspecified: Secondary | ICD-10-CM | POA: Diagnosis not present

## 2014-01-11 DIAGNOSIS — I10 Essential (primary) hypertension: Secondary | ICD-10-CM | POA: Diagnosis not present

## 2014-04-09 DIAGNOSIS — E7800 Pure hypercholesterolemia, unspecified: Secondary | ICD-10-CM | POA: Diagnosis not present

## 2014-04-09 DIAGNOSIS — I10 Essential (primary) hypertension: Secondary | ICD-10-CM | POA: Diagnosis not present

## 2014-07-09 DIAGNOSIS — I10 Essential (primary) hypertension: Secondary | ICD-10-CM | POA: Diagnosis not present

## 2014-07-09 DIAGNOSIS — M1990 Unspecified osteoarthritis, unspecified site: Secondary | ICD-10-CM | POA: Diagnosis not present

## 2014-07-09 DIAGNOSIS — Z23 Encounter for immunization: Secondary | ICD-10-CM | POA: Diagnosis not present

## 2014-07-25 ENCOUNTER — Other Ambulatory Visit (HOSPITAL_COMMUNITY): Payer: Self-pay | Admitting: Internal Medicine

## 2014-07-25 DIAGNOSIS — Z1231 Encounter for screening mammogram for malignant neoplasm of breast: Secondary | ICD-10-CM

## 2014-08-24 ENCOUNTER — Ambulatory Visit (HOSPITAL_COMMUNITY)
Admission: RE | Admit: 2014-08-24 | Discharge: 2014-08-24 | Disposition: A | Discharge status: Home / Self Care | Payer: Medicare Other | Source: Ambulatory Visit | Attending: Internal Medicine | Admitting: Internal Medicine

## 2014-08-24 DIAGNOSIS — Z1231 Encounter for screening mammogram for malignant neoplasm of breast: Secondary | ICD-10-CM | POA: Insufficient documentation

## 2014-09-27 DIAGNOSIS — Z23 Encounter for immunization: Secondary | ICD-10-CM | POA: Diagnosis not present

## 2014-10-22 DIAGNOSIS — M1990 Unspecified osteoarthritis, unspecified site: Secondary | ICD-10-CM | POA: Diagnosis not present

## 2014-10-22 DIAGNOSIS — I10 Essential (primary) hypertension: Secondary | ICD-10-CM | POA: Diagnosis not present

## 2014-11-28 DIAGNOSIS — Z961 Presence of intraocular lens: Secondary | ICD-10-CM | POA: Diagnosis not present

## 2014-11-28 DIAGNOSIS — H04123 Dry eye syndrome of bilateral lacrimal glands: Secondary | ICD-10-CM | POA: Diagnosis not present

## 2015-01-21 DIAGNOSIS — M1990 Unspecified osteoarthritis, unspecified site: Secondary | ICD-10-CM | POA: Diagnosis not present

## 2015-01-21 DIAGNOSIS — M609 Myositis, unspecified: Secondary | ICD-10-CM | POA: Diagnosis not present

## 2015-01-21 DIAGNOSIS — E780 Pure hypercholesterolemia: Secondary | ICD-10-CM | POA: Diagnosis not present

## 2015-01-21 DIAGNOSIS — I10 Essential (primary) hypertension: Secondary | ICD-10-CM | POA: Diagnosis not present

## 2015-04-22 DIAGNOSIS — M1990 Unspecified osteoarthritis, unspecified site: Secondary | ICD-10-CM | POA: Diagnosis not present

## 2015-04-22 DIAGNOSIS — I10 Essential (primary) hypertension: Secondary | ICD-10-CM | POA: Diagnosis not present

## 2015-04-24 ENCOUNTER — Other Ambulatory Visit: Payer: Self-pay | Admitting: Dermatology

## 2015-04-24 DIAGNOSIS — D485 Neoplasm of uncertain behavior of skin: Secondary | ICD-10-CM | POA: Diagnosis not present

## 2015-04-24 DIAGNOSIS — H6123 Impacted cerumen, bilateral: Secondary | ICD-10-CM | POA: Diagnosis not present

## 2015-04-24 DIAGNOSIS — L919 Hypertrophic disorder of the skin, unspecified: Secondary | ICD-10-CM | POA: Diagnosis not present

## 2015-04-24 DIAGNOSIS — L814 Other melanin hyperpigmentation: Secondary | ICD-10-CM | POA: Diagnosis not present

## 2015-04-24 DIAGNOSIS — L821 Other seborrheic keratosis: Secondary | ICD-10-CM | POA: Diagnosis not present

## 2015-04-24 DIAGNOSIS — D3612 Benign neoplasm of peripheral nerves and autonomic nervous system, upper limb, including shoulder: Secondary | ICD-10-CM | POA: Diagnosis not present

## 2015-07-08 ENCOUNTER — Emergency Department (HOSPITAL_COMMUNITY): Payer: Medicare Other

## 2015-07-08 ENCOUNTER — Emergency Department (HOSPITAL_COMMUNITY)
Admission: EM | Admit: 2015-07-08 | Discharge: 2015-07-08 | Disposition: A | Discharge status: Home / Self Care | Payer: Medicare Other | Attending: Emergency Medicine | Admitting: Emergency Medicine

## 2015-07-08 ENCOUNTER — Encounter (HOSPITAL_COMMUNITY): Payer: Self-pay | Admitting: *Deleted

## 2015-07-08 DIAGNOSIS — N390 Urinary tract infection, site not specified: Secondary | ICD-10-CM | POA: Diagnosis not present

## 2015-07-08 DIAGNOSIS — R531 Weakness: Secondary | ICD-10-CM | POA: Diagnosis not present

## 2015-07-08 DIAGNOSIS — I499 Cardiac arrhythmia, unspecified: Secondary | ICD-10-CM | POA: Diagnosis not present

## 2015-07-08 DIAGNOSIS — I10 Essential (primary) hypertension: Secondary | ICD-10-CM | POA: Insufficient documentation

## 2015-07-08 DIAGNOSIS — Z791 Long term (current) use of non-steroidal anti-inflammatories (NSAID): Secondary | ICD-10-CM | POA: Insufficient documentation

## 2015-07-08 DIAGNOSIS — Z79899 Other long term (current) drug therapy: Secondary | ICD-10-CM | POA: Diagnosis not present

## 2015-07-08 DIAGNOSIS — Z91040 Latex allergy status: Secondary | ICD-10-CM | POA: Diagnosis not present

## 2015-07-08 DIAGNOSIS — R5383 Other fatigue: Secondary | ICD-10-CM | POA: Diagnosis present

## 2015-07-08 DIAGNOSIS — Z7982 Long term (current) use of aspirin: Secondary | ICD-10-CM | POA: Diagnosis not present

## 2015-07-08 LAB — URINALYSIS, ROUTINE W REFLEX MICROSCOPIC
BILIRUBIN URINE: NEGATIVE
Glucose, UA: NEGATIVE mg/dL
KETONES UR: NEGATIVE mg/dL
NITRITE: NEGATIVE
PH: 8.5 — AB (ref 5.0–8.0)
PROTEIN: NEGATIVE mg/dL
Specific Gravity, Urine: 1.015 (ref 1.005–1.030)
Urobilinogen, UA: 0.2 mg/dL (ref 0.0–1.0)

## 2015-07-08 LAB — BASIC METABOLIC PANEL
ANION GAP: 13 (ref 5–15)
BUN: 10 mg/dL (ref 6–20)
CO2: 28 mmol/L (ref 22–32)
Calcium: 9.2 mg/dL (ref 8.9–10.3)
Chloride: 89 mmol/L — ABNORMAL LOW (ref 101–111)
Creatinine, Ser: 0.9 mg/dL (ref 0.44–1.00)
GFR calc non Af Amer: 57 mL/min — ABNORMAL LOW (ref >60)
GLUCOSE: 125 mg/dL — AB (ref 65–99)
POTASSIUM: 3.1 mmol/L — AB (ref 3.5–5.1)
SODIUM: 130 mmol/L — AB (ref 135–145)

## 2015-07-08 LAB — URINE MICROSCOPIC-ADD ON

## 2015-07-08 LAB — CBC
HCT: 37 % (ref 36.0–46.0)
HEMOGLOBIN: 12 g/dL (ref 12.0–15.0)
MCH: 28.4 pg (ref 26.0–34.0)
MCHC: 32.4 g/dL (ref 30.0–36.0)
MCV: 87.7 fL (ref 78.0–100.0)
PLATELETS: 377 10*3/uL (ref 150–400)
RBC: 4.22 MIL/uL (ref 3.87–5.11)
RDW: 13.9 % (ref 11.5–15.5)
WBC: 5 10*3/uL (ref 4.0–10.5)

## 2015-07-08 LAB — TROPONIN I: Troponin I: <0.03 ng/mL (ref <0.031)

## 2015-07-08 MED ORDER — SODIUM CHLORIDE 0.9 % IV SOLN
INTRAVENOUS | Status: DC
  Administered 2015-07-08: 12:00:00 via INTRAVENOUS

## 2015-07-08 MED ORDER — POTASSIUM CHLORIDE ER 10 MEQ PO TBCR
10.0000 meq | EXTENDED_RELEASE_TABLET | Freq: Two times a day (BID) | ORAL | Status: DC
Start: 2015-07-08 — End: 2015-07-18

## 2015-07-08 MED ORDER — POTASSIUM CHLORIDE CRYS ER 20 MEQ PO TBCR
40.0000 meq | EXTENDED_RELEASE_TABLET | Freq: Once | ORAL | Status: AC
  Administered 2015-07-08: 40 meq via ORAL
  Filled 2015-07-08: qty 2

## 2015-07-08 MED ORDER — DEXTROSE 5 % IV SOLN
1.0000 g | Freq: Once | INTRAVENOUS | Status: AC
  Administered 2015-07-08: 1 g via INTRAVENOUS
  Filled 2015-07-08: qty 10

## 2015-07-08 MED ORDER — SODIUM CHLORIDE 0.9 % IV BOLUS (SEPSIS)
500.0000 mL | Freq: Once | INTRAVENOUS | Status: AC
  Administered 2015-07-08: 500 mL via INTRAVENOUS

## 2015-07-08 MED ORDER — CEPHALEXIN 500 MG PO CAPS: 500.0000 mg | ORAL_CAPSULE | Freq: Four times a day (QID) | ORAL | Status: DC

## 2015-07-08 NOTE — ED Notes (Signed)
Patient reports feeling "weak and drained" this past week. Denies pain, blurred vision, slurred speech or any other problems.

## 2015-07-08 NOTE — ED Provider Notes (Signed)
CSN: [id number]     Arrival date & time 07/08/15  8250 History  This chart was scribed for Fredia Sorrow, MD by Terressa Koyanagi, ED Scribe. This patient was seen in room APA18/APA18 and the patient's care was started at 8:19 AM.   Chief Complaint  Patient presents with  . Fatigue   Patient is a 81 y.o. female presenting with weakness. The history is provided by the patient. No language interpreter was used.  Weakness This is a new problem. The current episode started more than 2 days ago. The problem occurs constantly. The problem has not changed since onset.Pertinent negatives include no chest pain, no abdominal pain, no headaches and no shortness of breath.   PCP: Rosita Fire, MD HPI Comments: Charlotte [name] is a 81 y.o. female, with PMHx noted below including HTN, who presents to the Emergency Department complaining of generalized weakness with associated fatigue onset one week ago and worsening today. Pt denies any pain, slurred speech, fever, chills, visual changes, cold Sx (sore throat, congestion, runny nose), chest pain, SOB, abd pain, n/v/d, dysuria, swelling of BLE, rash, Hx of bleeding easily, new back pain (chronic condition), headache, numbness, or any other Sx at this time. Pt's O2 during exam is 94% on RA.   Past Medical History  Diagnosis Date  . Hypertension    Past Surgical History  Procedure Laterality Date  . Breast lumpectomy      right side  . Tonsillectomy    . Dilation and curettage of uterus    . Foot surgery     History reviewed. No pertinent family history. History  Substance Use Topics  . Smoking status: Never Smoker   . Smokeless tobacco: Not on file  . Alcohol Use: No   OB History    No data available     Review of Systems  Constitutional: Negative for fever and chills.  HENT: Negative for congestion, rhinorrhea and sore throat.   Eyes: Negative for visual disturbance.  Respiratory: Negative for shortness of breath.   Cardiovascular: Negative  for chest pain and leg swelling.  Gastrointestinal: Negative for nausea, vomiting, abdominal pain and diarrhea.  Genitourinary: Negative for dysuria.  Musculoskeletal: Positive for back pain (at baseline).  Neurological: Positive for weakness. Negative for numbness and headaches.  Hematological: Does not bruise/bleed easily.  Psychiatric/Behavioral: Negative for confusion.   Allergies  Latex; Motrin; and Bextra  Home Medications   Prior to Admission medications   Medication Sig Start Date End Date Taking? Authorizing Provider  acetaminophen (TYLENOL) 500 MG tablet Take 1,000 mg by mouth daily as needed for mild pain.   Yes Historical Provider, MD  aspirin EC 81 MG tablet Take 81 mg by mouth daily.   Yes Historical Provider, MD  Calcium Carbonate-Vitamin D (CALCIUM 600+D3 PO) Take 1 tablet by mouth 2 (two) times daily.   Yes Historical Provider, MD  carbamide peroxide (DEBROX) 6.5 % otic solution Place 1 drop into both ears daily as needed (ear wax buildup).   Yes Historical Provider, MD  cycloSPORINE (RESTASIS) 0.05 % ophthalmic emulsion Place 1 drop into both eyes 2 (two) times daily.   Yes Historical Provider, MD  felodipine (PLENDIL) 5 MG 24 hr tablet Take 5 mg by mouth daily.   Yes Historical Provider, MD  fluticasone (FLONASE) 50 MCG/ACT nasal spray Place 2 sprays into the nose daily as needed for allergies.   Yes Historical Provider, MD  hydrochlorothiazide (HYDRODIURIL) 25 MG tablet Take 25 mg by mouth daily.   Yes  Historical Provider, MD  Liniments (SALONPAS PAIN RELIEF PATCH) PADS Apply 1 each topically daily as needed (pain).   Yes Historical Provider, MD  losartan (COZAAR) 100 MG tablet Take 100 mg by mouth daily.   Yes Historical Provider, MD  Multiple Vitamins-Minerals (CENTRUM PO) Take 1 tablet by mouth daily.   Yes Historical Provider, MD  Omega-3 Fatty Acids (FISH OIL TRIPLE STRENGTH) 1400 MG CAPS Take 1 tablet by mouth daily.   Yes Historical Provider, MD  Polyethyl  Glycol-Propyl Glycol (SYSTANE OP) Apply 1 drop to eye 4 (four) times daily.   Yes Historical Provider, MD  tretinoin (RETIN-A) 7.517 % cream 1 application daily.  06/27/15  Yes Historical Provider, MD  cephALEXin (KEFLEX) 500 MG capsule Take 1 capsule (500 mg total) by mouth 4 (four) times daily. 07/08/15   Fredia Sorrow, MD  potassium chloride (K-DUR) 10 MEQ tablet Take 1 tablet (10 mEq total) by mouth 2 (two) times daily. 07/08/15   Fredia Sorrow, MD  potassium chloride SA (K-DUR,KLOR-CON) 20 MEQ tablet Take 1 tablet (20 mEq total) by mouth 2 (two) times daily. Patient not taking: Reported on 07/08/2015 06/15/13   Daleen Bo, MD   Triage Vitals: BP 187/80 mmHg  Pulse 81  Temp(Src) 97.9 F (36.6 C) (Oral)  Resp 18  SpO2 97% Physical Exam  Constitutional: She is oriented to person, place, and time. She appears well-developed and well-nourished.  HENT:  Head: Normocephalic.  Mouth/Throat: Oropharynx is clear and moist and mucous membranes are normal.  Eyes: EOM are normal. Pupils are equal, round, and reactive to light.  Neck: Normal range of motion.  Cardiovascular: Normal heart sounds.  An irregular rhythm present.  No murmur heard. Pulmonary/Chest: Effort normal and breath sounds normal.  Abdominal: Bowel sounds are normal. She exhibits no distension. There is no tenderness.  Musculoskeletal: Normal range of motion.  Neurological: She is alert and oriented to person, place, and time. No cranial nerve deficit. She exhibits normal muscle tone. Coordination normal.  Pt able to move both sets of fingers and toes   Psychiatric: She has a normal mood and affect.  Nursing note and vitals reviewed.   ED Course  Procedures (including critical care time) DIAGNOSTIC STUDIES: Oxygen Saturation is 97% on RA, nl by my interpretation.    COORDINATION OF CARE: 8:23 AM-Discussed treatment plan which includes UA and labs with pt at bedside and pt agreed to plan.   Results for orders placed  or performed during the hospital encounter of 00/17/49  Basic metabolic panel  Result Value Ref Range   Sodium 130 (L) 135 - 145 mmol/L   Potassium 3.1 (L) 3.5 - 5.1 mmol/L   Chloride 89 (L) 101 - 111 mmol/L   CO2 28 22 - 32 mmol/L   Glucose, Bld 125 (H) 65 - 99 mg/dL   BUN 10 6 - 20 mg/dL   Creatinine, Ser 0.90 0.44 - 1.00 mg/dL   Calcium 9.2 8.9 - 10.3 mg/dL   GFR calc non Af Amer 57 (L) >60 mL/min   GFR calc Af Amer >60 >60 mL/min   Anion gap 13 5 - 15  CBC  Result Value Ref Range   WBC 5.0 4.0 - 10.5 K/uL   RBC 4.22 3.87 - 5.11 MIL/uL   Hemoglobin 12.0 12.0 - 15.0 g/dL   HCT 37.0 36.0 - 46.0 %   MCV 87.7 78.0 - 100.0 fL   MCH 28.4 26.0 - 34.0 pg   MCHC 32.4 30.0 - 36.0 g/dL  RDW 13.9 11.5 - 15.5 %   Platelets 377 150 - 400 K/uL  Urinalysis, Routine w reflex microscopic (not at [facility])  Result Value Ref Range   Color, Urine YELLOW YELLOW   APPearance CLEAR CLEAR   Specific Gravity, Urine 1.015 1.005 - 1.030   pH 8.5 (H) 5.0 - 8.0   Glucose, UA NEGATIVE NEGATIVE mg/dL   Hgb urine dipstick TRACE (A) NEGATIVE   Bilirubin Urine NEGATIVE NEGATIVE   Ketones, ur NEGATIVE NEGATIVE mg/dL   Protein, ur NEGATIVE NEGATIVE mg/dL   Urobilinogen, UA 0.2 0.0 - 1.0 mg/dL   Nitrite NEGATIVE NEGATIVE   Leukocytes, UA SMALL (A) NEGATIVE  Urine microscopic-add on  Result Value Ref Range   WBC, UA 3-6 <3 WBC/hpf   RBC / HPF 3-6 <3 RBC/hpf   Bacteria, UA MANY (A) RARE   Casts GRANULAR CAST (A) NEGATIVE  Troponin I  Result Value Ref Range   Troponin I <0.03 <0.031 ng/mL   Dg Chest 2 View  07/08/2015   CLINICAL DATA:  Weakness for 1 week.  EXAM: CHEST  2 VIEW  COMPARISON:  06/15/2013  FINDINGS: The heart size and mediastinal contours are within normal limits. Both lungs are clear. The visualized skeletal structures are unremarkable.  IMPRESSION: No active cardiopulmonary disease.   Electronically Signed   By: Rolm Baptise M.D.   On: 07/08/2015 08:59    Medications  0.9 %  sodium  chloride infusion ( Intravenous New Bag/Given 07/08/15 1214)  sodium chloride 0.9 % bolus 500 mL (500 mLs Intravenous New Bag/Given 07/08/15 1049)  cefTRIAXone (ROCEPHIN) 1 g in dextrose 5 % 50 mL IVPB (1 g Intravenous New Bag/Given 07/08/15 1050)  potassium chloride SA (K-DUR,KLOR-CON) CR tablet 40 mEq (40 mEq Oral Given 07/08/15 1049)      MDM   Final diagnoses:  Weakness  UTI (lower urinary tract infection)   Patient urine suggests a urinary tract infection. Urine culture sent. We'll start on Keflex started in ED with Rocephin. Patient also with some mild hypokalemia and hyponatremia. Patient given some fluid hydration here and started on oral potassium. That'll be continued for the next 2 days. Patient will follow-up with her record Dr. Troponin was negative chest x-ray without any acute findings. No leukocytosis no anemia.  I personally performed the services described in this documentation, which was scribed in my presence. The recorded information has been reviewed and is accurate.     Fredia Sorrow, MD 07/08/15 1341

## 2015-07-08 NOTE — Discharge Instructions (Signed)
Take the potassium as directed. Take the antibody as directed. Make an appointment to follow-up with your record Dr. return for any new or worse symptoms.

## 2015-07-08 NOTE — ED Notes (Signed)
Patient with no complaints at this time. Respirations even and unlabored. Skin warm/dry. Discharge instructions reviewed with patient at this time. Patient given opportunity to voice concerns/ask questions. IV removed per policy and band-aid applied to site. Patient discharged at this time and left Emergency Department via wheelchair.  

## 2015-07-09 LAB — URINE CULTURE

## 2015-07-18 ENCOUNTER — Emergency Department (HOSPITAL_COMMUNITY)
Admission: EM | Admit: 2015-07-18 | Discharge: 2015-07-18 | Disposition: A | Discharge status: Home / Self Care | Payer: Medicare Other | Attending: Emergency Medicine | Admitting: Emergency Medicine

## 2015-07-18 ENCOUNTER — Encounter (HOSPITAL_COMMUNITY): Payer: Self-pay | Admitting: *Deleted

## 2015-07-18 DIAGNOSIS — Z7982 Long term (current) use of aspirin: Secondary | ICD-10-CM | POA: Diagnosis not present

## 2015-07-18 DIAGNOSIS — Z79899 Other long term (current) drug therapy: Secondary | ICD-10-CM | POA: Diagnosis not present

## 2015-07-18 DIAGNOSIS — E876 Hypokalemia: Secondary | ICD-10-CM | POA: Insufficient documentation

## 2015-07-18 DIAGNOSIS — E871 Hypo-osmolality and hyponatremia: Secondary | ICD-10-CM | POA: Diagnosis not present

## 2015-07-18 DIAGNOSIS — R531 Weakness: Secondary | ICD-10-CM | POA: Diagnosis present

## 2015-07-18 DIAGNOSIS — E860 Dehydration: Secondary | ICD-10-CM | POA: Diagnosis not present

## 2015-07-18 DIAGNOSIS — Z91040 Latex allergy status: Secondary | ICD-10-CM | POA: Insufficient documentation

## 2015-07-18 DIAGNOSIS — I10 Essential (primary) hypertension: Secondary | ICD-10-CM | POA: Diagnosis not present

## 2015-07-18 LAB — COMPREHENSIVE METABOLIC PANEL
ALT: 21 U/L (ref 14–54)
ANION GAP: 12 (ref 5–15)
AST: 22 U/L (ref 15–41)
Albumin: 3.9 g/dL (ref 3.5–5.0)
Alkaline Phosphatase: 62 U/L (ref 38–126)
BUN: 12 mg/dL (ref 6–20)
CO2: 29 mmol/L (ref 22–32)
CREATININE: 0.79 mg/dL (ref 0.44–1.00)
Calcium: 9.5 mg/dL (ref 8.9–10.3)
Chloride: 90 mmol/L — ABNORMAL LOW (ref 101–111)
GFR calc Af Amer: >60 mL/min (ref >60)
GFR calc non Af Amer: >60 mL/min (ref >60)
Glucose, Bld: 122 mg/dL — ABNORMAL HIGH (ref 65–99)
Potassium: 3.3 mmol/L — ABNORMAL LOW (ref 3.5–5.1)
Sodium: 131 mmol/L — ABNORMAL LOW (ref 135–145)
Total Bilirubin: 0.5 mg/dL (ref 0.3–1.2)
Total Protein: 7.5 g/dL (ref 6.5–8.1)

## 2015-07-18 LAB — CBC WITH DIFFERENTIAL/PLATELET
BASOS PCT: 0 % (ref 0–1)
Basophils Absolute: 0 10*3/uL (ref 0.0–0.1)
EOS ABS: 0 10*3/uL (ref 0.0–0.7)
EOS PCT: 1 % (ref 0–5)
HEMATOCRIT: 37.9 % (ref 36.0–46.0)
HEMOGLOBIN: 12.7 g/dL (ref 12.0–15.0)
LYMPHS PCT: 35 % (ref 12–46)
Lymphs Abs: 1.8 10*3/uL (ref 0.7–4.0)
MCH: 29.1 pg (ref 26.0–34.0)
MCHC: 33.5 g/dL (ref 30.0–36.0)
MCV: 86.9 fL (ref 78.0–100.0)
MONO ABS: 0.5 10*3/uL (ref 0.1–1.0)
MONOS PCT: 9 % (ref 3–12)
Neutro Abs: 2.8 10*3/uL (ref 1.7–7.7)
Neutrophils Relative %: 55 % (ref 43–77)
PLATELETS: 365 10*3/uL (ref 150–400)
RBC: 4.36 MIL/uL (ref 3.87–5.11)
RDW: 13.5 % (ref 11.5–15.5)
WBC: 5.1 10*3/uL (ref 4.0–10.5)

## 2015-07-18 LAB — URINALYSIS, ROUTINE W REFLEX MICROSCOPIC
Bilirubin Urine: NEGATIVE
Glucose, UA: NEGATIVE mg/dL
Hgb urine dipstick: NEGATIVE
KETONES UR: NEGATIVE mg/dL
NITRITE: NEGATIVE
Protein, ur: NEGATIVE mg/dL
Specific Gravity, Urine: 1.01 (ref 1.005–1.030)
Urobilinogen, UA: 0.2 mg/dL (ref 0.0–1.0)
pH: 7 (ref 5.0–8.0)

## 2015-07-18 LAB — TROPONIN I

## 2015-07-18 LAB — URINE MICROSCOPIC-ADD ON

## 2015-07-18 MED ORDER — SODIUM CHLORIDE 0.9 % IV BOLUS (SEPSIS)
500.0000 mL | Freq: Once | INTRAVENOUS | Status: AC
  Administered 2015-07-18: 1000 mL via INTRAVENOUS

## 2015-07-18 MED ORDER — POTASSIUM CHLORIDE CRYS ER 20 MEQ PO TBCR
40.0000 meq | EXTENDED_RELEASE_TABLET | Freq: Once | ORAL | Status: AC
  Administered 2015-07-18: 40 meq via ORAL
  Filled 2015-07-18: qty 2

## 2015-07-18 NOTE — ED Notes (Signed)
Dr. Wickline at bedside.  

## 2015-07-18 NOTE — ED Notes (Signed)
Dr Christy Gentles at bedside,

## 2015-07-18 NOTE — ED Provider Notes (Signed)
CSN: [id number]     Arrival date & time 07/18/15  0431 History   First MD Initiated Contact with Patient 07/18/15 0445     Chief Complaint  Patient presents with  . Weakness     Patient is a 81 y.o. female presenting with weakness. The history is provided by the patient.  Weakness This is a recurrent problem. The current episode started more than 1 week ago. The problem occurs daily. The problem has been gradually worsening. Pertinent negatives include no chest pain, no abdominal pain, no headaches and no shortness of breath. Nothing aggravates the symptoms. Nothing relieves the symptoms.  Pt reports feeling generalized weakness - she reports she has "no energy and drained" No fever/vomiting/HA/visual changes/cp/sob/abd pain No focal arm/leg weakness No diplopia No dizziness No syncope She was seen/treated on 07/08/15 for UTI and hypokalemia but did not have any significant improvement after that treatment No h/o stroke  Past Medical History  Diagnosis Date  . Hypertension    Past Surgical History  Procedure Laterality Date  . Breast lumpectomy      right side  . Tonsillectomy    . Dilation and curettage of uterus    . Foot surgery     No family history on file. History  Substance Use Topics  . Smoking status: Never Smoker   . Smokeless tobacco: Not on file  . Alcohol Use: No   OB History    No data available     Review of Systems  Constitutional: Positive for fatigue. Negative for fever.  Eyes: Negative for visual disturbance.  Respiratory: Negative for shortness of breath.   Cardiovascular: Negative for chest pain.  Gastrointestinal: Negative for vomiting, abdominal pain, diarrhea and blood in stool.  Genitourinary: Negative for hematuria.  Neurological: Positive for weakness. Negative for dizziness, syncope, speech difficulty, numbness and headaches.  All other systems reviewed and are negative.     Allergies  Latex; Motrin; and Bextra  Home Medications    Prior to Admission medications   Medication Sig Start Date End Date Taking? Authorizing Provider  acetaminophen (TYLENOL) 500 MG tablet Take 1,000 mg by mouth daily as needed for mild pain.    Historical Provider, MD  aspirin EC 81 MG tablet Take 81 mg by mouth daily.    Historical Provider, MD  Calcium Carbonate-Vitamin D (CALCIUM 600+D3 PO) Take 1 tablet by mouth 2 (two) times daily.    Historical Provider, MD  carbamide peroxide (DEBROX) 6.5 % otic solution Place 1 drop into both ears daily as needed (ear wax buildup).    Historical Provider, MD  cycloSPORINE (RESTASIS) 0.05 % ophthalmic emulsion Place 1 drop into both eyes 2 (two) times daily.    Historical Provider, MD  felodipine (PLENDIL) 5 MG 24 hr tablet Take 5 mg by mouth daily.    Historical Provider, MD  fluticasone (FLONASE) 50 MCG/ACT nasal spray Place 2 sprays into the nose daily as needed for allergies.    Historical Provider, MD  hydrochlorothiazide (HYDRODIURIL) 25 MG tablet Take 25 mg by mouth daily.    Historical Provider, MD  Liniments (SALONPAS PAIN RELIEF PATCH) PADS Apply 1 each topically daily as needed (pain).    Historical Provider, MD  losartan (COZAAR) 100 MG tablet Take 100 mg by mouth daily.    Historical Provider, MD  Multiple Vitamins-Minerals (CENTRUM PO) Take 1 tablet by mouth daily.    Historical Provider, MD  Omega-3 Fatty Acids (FISH OIL TRIPLE STRENGTH) 1400 MG CAPS Take 1 tablet by  mouth daily.    Historical Provider, MD  Polyethyl Glycol-Propyl Glycol (SYSTANE OP) Apply 1 drop to eye 4 (four) times daily.    Historical Provider, MD  potassium chloride (K-DUR) 10 MEQ tablet Take 1 tablet (10 mEq total) by mouth 2 (two) times daily. 07/08/15   Fredia Sorrow, MD  tretinoin (RETIN-A) 1.761 % cream 1 application daily.  06/27/15   Historical Provider, MD   BP 175/72 mmHg  Pulse 63  Temp(Src) 98 F (36.7 C) (Oral)  Resp 16  SpO2 96% Physical Exam CONSTITUTIONAL: Well developed/well nourished HEAD:  Normocephalic/atraumatic EYES: EOMI/PERRL, no nystagmus, no ptosis ENMT: Mucous membranes moist NECK: supple no meningeal signs, no bruits SPINE/BACK:entire spine nontender CV: S1/S2 noted, no murmurs/rubs/gallops noted LUNGS: Lungs are clear to auscultation bilaterally, no apparent distress ABDOMEN: soft, nontender, no rebound or guarding GU:no cva tenderness NEURO:Awake/alert, facies symmetric, no arm or leg drift is noted Equal 5/5 strength with shoulder abduction, elbow flex/extension, wrist flex/extension in upper extremities and equal hand grips bilaterally Equal 5/5 strength with hip flexion,knee flex/extension, foot dorsi/plantar flexion Cranial nerves 3/4/5/6/06/28/09/11/12 tested and intact Gait normal without ataxia No past pointing Sensation to light touch intact in all extremities EXTREMITIES: pulses normal, full ROM SKIN: warm, color normal PSYCH: no abnormalities of mood noted, alert and oriented to situation   ED Course  Procedures  5:22 AM Pt here for continued symptoms of fatigue despite recent tx for UTI/hypokalemia She is well appearing/ambulatory and no neuro deficits at this time Doubt CVA as cause, defer neuroimaging for now She is well appearing and appears younger than stated age Will repeat labs/urinalysis and reassess 5:49 AM Pt with continued mild hyponatremia/hypochloremia/hypokalemia Will have her stop HCTZ.  Suspect this may be culprit of symptoms She has PCP f/u early next month 6:08 AM STOP HCTZ/KCL KEEP LOSARTAN AS PRESCRIBED INCREASE FELODIPINE TO 10MG  DAILY UNTIL SHE SEES PCP SHE HAS SIGNIFICANT HYPERTENSION AND SHOULD TOLERATED THIS INCREASED PT AGREEABLE WITH PLAN  Labs Review Labs Reviewed  COMPREHENSIVE METABOLIC PANEL - Abnormal; Notable for the following:    Sodium 131 (*)    Potassium 3.3 (*)    Chloride 90 (*)    Glucose, Bld 122 (*)    All other components within normal limits  URINALYSIS, ROUTINE W REFLEX MICROSCOPIC (NOT AT  [facility]) - Abnormal; Notable for the following:    Leukocytes, UA TRACE (*)    All other components within normal limits  CBC WITH DIFFERENTIAL/PLATELET  TROPONIN I  URINE MICROSCOPIC-ADD ON      EKG Interpretation   Date/Time:  Thursday July 18 2015 05:02:26 EDT Ventricular Rate:  64 PR Interval:  148 QRS Duration: 101 QT Interval:  409 QTC Calculation: 422 R Axis:   -33 Text Interpretation:  Sinus rhythm Left ventricular hypertrophy Abnormal  ekg No significant change since last tracing Confirmed by Christy Gentles  MD,  [facility] ([id number]) on 07/18/2015 5:08:32 AM     Medications  sodium chloride 0.9 % bolus 500 mL (1,000 mLs Intravenous New Bag/Given 07/18/15 0558)  potassium chloride SA (K-DUR,KLOR-CON) CR tablet 40 mEq (40 mEq Oral Given 07/18/15 0558)    MDM   Final diagnoses:  Essential hypertension  Hyponatremia  Hypokalemia  Dehydration    Nursing notes including past medical history and social history reviewed and considered in documentation Previous records reviewed and considered Labs/vital reviewed myself and considered during evaluation     Ripley Fraise, MD 07/18/15 [phone]

## 2015-07-18 NOTE — ED Notes (Signed)
Pt c/o generalized weakness, states that she was recently seen in er for weakness and uti, given antiobodic which she has finished, given potassium as well, pt states that she never "really got any better" weakness became worse yesterday, denies any fever, n/v/d/

## 2015-07-18 NOTE — Discharge Instructions (Signed)
STOP HCTZ TAKE FELODIPINE 10MG  DAILY UNTIL YOU SEE DR Legrand Rams CONTINUE LOSARTAN AS DIRECTED  Dehydration, Adult Dehydration means your body does not have as much fluid as it needs. Your kidneys, brain, and heart will not work properly without the right amount of fluids and salt.  HOME CARE  Ask your doctor how to replace body fluid losses (rehydrate).  Drink enough fluids to keep your pee (urine) clear or pale yellow.  Drink small amounts of fluids often if you feel sick to your stomach (nauseous) or throw up (vomit).  Eat like you normally do.  Avoid:  Foods or drinks high in sugar.  Bubbly (carbonated) drinks.  Juice.  Very hot or cold fluids.  Drinks with caffeine.  Fatty, greasy foods.  Alcohol.  Tobacco.  Eating too much.  Gelatin desserts.  Wash your hands to avoid spreading germs (bacteria, viruses).  Only take medicine as told by your doctor.  Keep all doctor visits as told. GET HELP RIGHT AWAY IF:   You cannot drink something without throwing up.  You get worse even with treatment.  Your vomit has blood in it or looks greenish.  Your poop (stool) has blood in it or looks black and tarry.  You have not peed in 6 to 8 hours.  You pee a small amount of very dark pee.  You have a fever.  You pass out (faint).  You have belly (abdominal) pain that gets worse or stays in one spot (localizes).  You have a rash, stiff neck, or bad headache.  You get easily annoyed, sleepy, or are hard to wake up.  You feel weak, dizzy, or very thirsty. MAKE SURE YOU:   Understand these instructions.  Will watch your condition.  Will get help right away if you are not doing well or get worse. Document Released: 10/03/2009 Document Revised: 02/29/2012 Document Reviewed: 07/27/2011 [facility] Patient Information 2015 [place], [place]. This information is not intended to replace advice given to you by your health care provider. Make sure you discuss any questions  you have with your health care provider.

## 2015-07-21 ENCOUNTER — Encounter (HOSPITAL_COMMUNITY): Payer: Self-pay | Admitting: Emergency Medicine

## 2015-07-21 ENCOUNTER — Emergency Department (HOSPITAL_COMMUNITY)
Admission: EM | Admit: 2015-07-21 | Discharge: 2015-07-21 | Disposition: A | Discharge status: Home / Self Care | Payer: Medicare Other | Attending: Emergency Medicine | Admitting: Emergency Medicine

## 2015-07-21 ENCOUNTER — Emergency Department (HOSPITAL_COMMUNITY): Payer: Medicare Other

## 2015-07-21 DIAGNOSIS — Z7982 Long term (current) use of aspirin: Secondary | ICD-10-CM | POA: Insufficient documentation

## 2015-07-21 DIAGNOSIS — E871 Hypo-osmolality and hyponatremia: Secondary | ICD-10-CM | POA: Diagnosis not present

## 2015-07-21 DIAGNOSIS — Z91040 Latex allergy status: Secondary | ICD-10-CM | POA: Insufficient documentation

## 2015-07-21 DIAGNOSIS — R5383 Other fatigue: Secondary | ICD-10-CM | POA: Diagnosis present

## 2015-07-21 DIAGNOSIS — I10 Essential (primary) hypertension: Secondary | ICD-10-CM | POA: Insufficient documentation

## 2015-07-21 DIAGNOSIS — N390 Urinary tract infection, site not specified: Secondary | ICD-10-CM | POA: Diagnosis not present

## 2015-07-21 DIAGNOSIS — Z79899 Other long term (current) drug therapy: Secondary | ICD-10-CM | POA: Diagnosis not present

## 2015-07-21 DIAGNOSIS — R531 Weakness: Secondary | ICD-10-CM | POA: Diagnosis not present

## 2015-07-21 LAB — BASIC METABOLIC PANEL
Anion gap: 10 (ref 5–15)
BUN: 11 mg/dL (ref 6–20)
CHLORIDE: 93 mmol/L — AB (ref 101–111)
CO2: 27 mmol/L (ref 22–32)
CREATININE: 0.83 mg/dL (ref 0.44–1.00)
Calcium: 8.9 mg/dL (ref 8.9–10.3)
GFR calc non Af Amer: >60 mL/min (ref >60)
Glucose, Bld: 115 mg/dL — ABNORMAL HIGH (ref 65–99)
Potassium: 3.6 mmol/L (ref 3.5–5.1)
Sodium: 130 mmol/L — ABNORMAL LOW (ref 135–145)

## 2015-07-21 LAB — CBC WITH DIFFERENTIAL/PLATELET
BASOS ABS: 0 10*3/uL (ref 0.0–0.1)
BASOS PCT: 0 % (ref 0–1)
EOS ABS: 0 10*3/uL (ref 0.0–0.7)
Eosinophils Relative: 0 % (ref 0–5)
HEMATOCRIT: 37.8 % (ref 36.0–46.0)
Hemoglobin: 12.8 g/dL (ref 12.0–15.0)
Lymphocytes Relative: 40 % (ref 12–46)
Lymphs Abs: 2.5 10*3/uL (ref 0.7–4.0)
MCH: 29.4 pg (ref 26.0–34.0)
MCHC: 33.9 g/dL (ref 30.0–36.0)
MCV: 86.9 fL (ref 78.0–100.0)
MONO ABS: 0.5 10*3/uL (ref 0.1–1.0)
MONOS PCT: 8 % (ref 3–12)
Neutro Abs: 3.2 10*3/uL (ref 1.7–7.7)
Neutrophils Relative %: 52 % (ref 43–77)
Platelets: 356 10*3/uL (ref 150–400)
RBC: 4.35 MIL/uL (ref 3.87–5.11)
RDW: 13.9 % (ref 11.5–15.5)
WBC: 6.2 10*3/uL (ref 4.0–10.5)

## 2015-07-21 LAB — URINE MICROSCOPIC-ADD ON

## 2015-07-21 LAB — TSH: TSH: 2.036 u[IU]/mL (ref 0.350–4.500)

## 2015-07-21 LAB — COMPREHENSIVE METABOLIC PANEL
ALT: 21 U/L (ref 14–54)
AST: 24 U/L (ref 15–41)
Albumin: 3.9 g/dL (ref 3.5–5.0)
Alkaline Phosphatase: 58 U/L (ref 38–126)
Anion gap: 12 (ref 5–15)
BILIRUBIN TOTAL: 0.4 mg/dL (ref 0.3–1.2)
BUN: 11 mg/dL (ref 6–20)
CALCIUM: 9.5 mg/dL (ref 8.9–10.3)
CHLORIDE: 88 mmol/L — AB (ref 101–111)
CO2: 28 mmol/L (ref 22–32)
CREATININE: 0.86 mg/dL (ref 0.44–1.00)
GFR calc Af Amer: >60 mL/min (ref >60)
GFR calc non Af Amer: 60 mL/min — ABNORMAL LOW (ref >60)
Glucose, Bld: 115 mg/dL — ABNORMAL HIGH (ref 65–99)
Potassium: 3.5 mmol/L (ref 3.5–5.1)
Sodium: 128 mmol/L — ABNORMAL LOW (ref 135–145)
Total Protein: 7.6 g/dL (ref 6.5–8.1)

## 2015-07-21 LAB — URINALYSIS, ROUTINE W REFLEX MICROSCOPIC
Bilirubin Urine: NEGATIVE
Glucose, UA: NEGATIVE mg/dL
KETONES UR: NEGATIVE mg/dL
NITRITE: NEGATIVE
PH: 8 (ref 5.0–8.0)
Protein, ur: NEGATIVE mg/dL
SPECIFIC GRAVITY, URINE: 1.01 (ref 1.005–1.030)
Urobilinogen, UA: 0.2 mg/dL (ref 0.0–1.0)

## 2015-07-21 LAB — TROPONIN I: Troponin I: <0.03 ng/mL (ref <0.031)

## 2015-07-21 MED ORDER — SODIUM CHLORIDE 0.9 % IV BOLUS (SEPSIS)
500.0000 mL | Freq: Once | INTRAVENOUS | Status: AC
  Administered 2015-07-21: 500 mL via INTRAVENOUS

## 2015-07-21 MED ORDER — SODIUM CHLORIDE 0.9 % IV SOLN
Freq: Once | INTRAVENOUS | Status: DC
Start: 2015-07-21 — End: 2015-07-22

## 2015-07-21 MED ORDER — CEPHALEXIN 500 MG PO CAPS: 500.0000 mg | ORAL_CAPSULE | Freq: Three times a day (TID) | ORAL | Status: DC

## 2015-07-21 MED ORDER — DEXTROSE 5 % IV SOLN
1.0000 g | Freq: Once | INTRAVENOUS | Status: AC
  Administered 2015-07-21: 1 g via INTRAVENOUS
  Filled 2015-07-21: qty 10

## 2015-07-21 NOTE — ED Notes (Addendum)
Pt ambulated to BR without difficulty. No distress noted.

## 2015-07-21 NOTE — ED Notes (Signed)
Pt assisted to restroom, tolerated well, Dr Wyvonnia Dusky at bedside,

## 2015-07-21 NOTE — ED Notes (Signed)
Pt reports generalized weakness. Seen here on the 28th for the same. Pt states, "When I drink, it goes straight through me." Pt denies pain of any kind. Pt was able to ambulated to restroom but she was unable to obtain a sample, stating that she had attempted to do so before leaving the house.

## 2015-07-21 NOTE — Discharge Instructions (Signed)
Urinary Tract Infection Follow-up tomorrow with Dr. Legrand Rams as scheduled. Take antibiotics as prescribed.Let him know you to have your sodium level rechecked. Return to the ED if you develop new or worsening symptoms. Urinary tract infections (UTIs) can develop anywhere along your urinary tract. Your urinary tract is your body's drainage system for removing wastes and extra water. Your urinary tract includes two kidneys, two ureters, a bladder, and a urethra. Your kidneys are a pair of bean-shaped organs. Each kidney is about the size of your fist. They are located below your ribs, one on each side of your spine. CAUSES Infections are caused by microbes, which are microscopic organisms, including fungi, viruses, and bacteria. These organisms are so small that they can only be seen through a microscope. Bacteria are the microbes that most commonly cause UTIs. SYMPTOMS  Symptoms of UTIs may vary by age and gender of the patient and by the location of the infection. Symptoms in young women typically include a frequent and intense urge to urinate and a painful, burning feeling in the bladder or urethra during urination. Older women and men are more likely to be tired, shaky, and weak and have muscle aches and abdominal pain. A fever may mean the infection is in your kidneys. Other symptoms of a kidney infection include pain in your back or sides below the ribs, nausea, and vomiting. DIAGNOSIS To diagnose a UTI, your caregiver will ask you about your symptoms. Your caregiver also will ask to provide a urine sample. The urine sample will be tested for bacteria and white blood cells. White blood cells are made by your body to help fight infection. TREATMENT  Typically, UTIs can be treated with medication. Because most UTIs are caused by a bacterial infection, they usually can be treated with the use of antibiotics. The choice of antibiotic and length of treatment depend on your symptoms and the type of bacteria  causing your infection. HOME CARE INSTRUCTIONS  If you were prescribed antibiotics, take them exactly as your caregiver instructs you. Finish the medication even if you feel better after you have only taken some of the medication.  Drink enough water and fluids to keep your urine clear or pale yellow.  Avoid caffeine, tea, and carbonated beverages. They tend to irritate your bladder.  Empty your bladder often. Avoid holding urine for long periods of time.  Empty your bladder before and after sexual intercourse.  After a bowel movement, women should cleanse from front to back. Use each tissue only once. SEEK MEDICAL CARE IF:   You have back pain.  You develop a fever.  Your symptoms do not begin to resolve within 3 days. SEEK IMMEDIATE MEDICAL CARE IF:   You have severe back pain or lower abdominal pain.  You develop chills.  You have nausea or vomiting.  You have continued burning or discomfort with urination. MAKE SURE YOU:   Understand these instructions.  Will watch your condition.  Will get help right away if you are not doing well or get worse. Document Released: 09/16/2005 Document Revised: 06/07/2012 Document Reviewed: 01/15/2012 [facility] Patient Information 2015 [place], [place]. This information is not intended to replace advice given to you by your health care provider. Make sure you discuss any questions you have with your health care provider.

## 2015-07-21 NOTE — ED Provider Notes (Signed)
CSN: [id number]     Arrival date & time 07/21/15  1741 History   First MD Initiated Contact with Patient 07/21/15 1752     Chief Complaint  Patient presents with  . Fatigue     (Consider location/radiation/quality/duration/timing/severity/associated sxs/prior Treatment) HPI Comments: Patient from home with 3 week history of generalized weakness and feeling fatigued. She states she feels that she has no energy. Similar visit to the ED 3 days ago with negative workup. Her blood pressure medications were adjusted her diarrhetic was stopped due to hypokalemia. Patient reports no improvement. Her blood pressure was elevated this morning at 017 systolic. She denies headache, chills, nausea, vomiting, fever. No chest pain or shortness of breath. No abdominal pain. No focal weakness, numbness or tingling. No dizziness. No syncope. Denies any other medical problems other than high blood pressure. She has PCP follow-up tomorrow. Good by mouth intake and urine output.  The history is provided by a relative and the patient.    Past Medical History  Diagnosis Date  . Hypertension    Past Surgical History  Procedure Laterality Date  . Breast lumpectomy      right side  . Tonsillectomy    . Dilation and curettage of uterus    . Foot surgery     Family History  Problem Relation Age of Onset  . Heart attack Mother   . Cancer Mother    History  Substance Use Topics  . Smoking status: Never Smoker   . Smokeless tobacco: Not on file  . Alcohol Use: No   OB History    Gravida Para Term Preterm AB TAB SAB Ectopic Multiple Living   2 2 2             Review of Systems  Constitutional: Positive for fatigue. Negative for fever, activity change and appetite change.  HENT: Negative for congestion and rhinorrhea.   Respiratory: Negative for chest tightness and wheezing.   Cardiovascular: Negative for chest pain.  Gastrointestinal: Negative for nausea, vomiting and abdominal pain.  Genitourinary:  Negative for vaginal bleeding and vaginal discharge.  Musculoskeletal: Negative for myalgias and arthralgias.  Skin: Negative for rash and wound.  Neurological: Positive for weakness. Negative for dizziness and headaches.  A complete 10 system review of systems was obtained and all systems are negative except as noted in the HPI and PMH.      Allergies  Latex; Motrin; and Bextra  Home Medications   Prior to Admission medications   Medication Sig Start Date End Date Taking? Authorizing Provider  acetaminophen (TYLENOL) 500 MG tablet Take 1,000 mg by mouth daily as needed for mild pain.   Yes Historical Provider, MD  aspirin EC 81 MG tablet Take 81 mg by mouth daily.   Yes Historical Provider, MD  Calcium Carbonate-Vitamin D (CALCIUM 600+D3 PO) Take 1 tablet by mouth 2 (two) times daily.   Yes Historical Provider, MD  carbamide peroxide (DEBROX) 6.5 % otic solution Place 1 drop into both ears daily as needed (ear wax buildup).   Yes Historical Provider, MD  cycloSPORINE (RESTASIS) 0.05 % ophthalmic emulsion Place 1 drop into both eyes 2 (two) times daily.   Yes Historical Provider, MD  felodipine (PLENDIL) 5 MG 24 hr tablet Take 10 mg by mouth daily.    Yes Historical Provider, MD  fluticasone (FLONASE) 50 MCG/ACT nasal spray Place 2 sprays into the nose daily as needed for allergies.   Yes Historical Provider, MD  Liniments ([place]) PADS  Apply 1 each topically daily as needed (pain).   Yes Historical Provider, MD  losartan (COZAAR) 100 MG tablet Take 100 mg by mouth daily.   Yes Historical Provider, MD  Omega-3 Fatty Acids (FISH OIL TRIPLE STRENGTH) 1400 MG CAPS Take 1 tablet by mouth daily.   Yes Historical Provider, MD  Polyethyl Glycol-Propyl Glycol (SYSTANE OP) Apply 1 drop to eye 4 (four) times daily.   Yes Historical Provider, MD  tretinoin (RETIN-A) 0.025 % cream Apply 1 application topically at bedtime.  06/27/15  Yes Historical Provider, MD  cephALEXin (KEFLEX)  500 MG capsule Take 1 capsule (500 mg total) by mouth 3 (three) times daily. 07/21/15   Ezequiel Essex, MD   BP 167/67 mmHg  Pulse 73  Temp(Src) 98.7 F (37.1 C) (Oral)  Resp 14  Ht 5\' 3"  (1.6 m)  Wt 140 lb (63.504 kg)  BMI 24.81 kg/m2  SpO2 96% Physical Exam  Constitutional: She is oriented to person, place, and time. She appears well-developed and well-nourished. No distress.  Well appearing, younger than stated age  HENT:  Head: Normocephalic and atraumatic.  Mouth/Throat: Oropharynx is clear and moist. No oropharyngeal exudate.  Eyes: Conjunctivae and EOM are normal. Pupils are equal, round, and reactive to light.  Neck: Normal range of motion. Neck supple.  No meningismus.  Cardiovascular: Normal rate, regular rhythm, normal heart sounds and intact distal pulses.   No murmur heard. Pulmonary/Chest: Effort normal and breath sounds normal. No respiratory distress.  Abdominal: Soft. There is no tenderness. There is no rebound and no guarding.  Musculoskeletal: Normal range of motion. She exhibits no edema or tenderness.  Neurological: She is alert and oriented to person, place, and time. No cranial nerve deficit. She exhibits normal muscle tone. Coordination normal.  No ataxia on finger to nose bilaterally. No pronator drift. 5/5 strength throughout. CN 2-12 intact. Negative Romberg. Equal grip strength. Sensation intact. Gait is normal.   Skin: Skin is warm.  Psychiatric: She has a normal mood and affect. Her behavior is normal.  Nursing note and vitals reviewed.   ED Course  Procedures (including critical care time) Labs Review Labs Reviewed  COMPREHENSIVE METABOLIC PANEL - Abnormal; Notable for the following:    Sodium 128 (*)    Chloride 88 (*)    Glucose, Bld 115 (*)    GFR calc non Af Amer 60 (*)    All other components within normal limits  URINALYSIS, ROUTINE W REFLEX MICROSCOPIC (NOT AT [facility]) - Abnormal; Notable for the following:    Hgb urine dipstick TRACE (*)     Leukocytes, UA MODERATE (*)    All other components within normal limits  BASIC METABOLIC PANEL - Abnormal; Notable for the following:    Sodium 130 (*)    Chloride 93 (*)    Glucose, Bld 115 (*)    All other components within normal limits  URINE CULTURE  CBC WITH DIFFERENTIAL/PLATELET  TROPONIN I  TSH  URINE MICROSCOPIC-ADD ON    Imaging Review Dg Chest 2 View  07/21/2015   CLINICAL DATA:  Generalized weakness. No chest injury or shortness of breath.  EXAM: CHEST  2 VIEW  COMPARISON:  07/08/2015.  02/02/2012 and 06/15/2013.  FINDINGS: Cardiopericardial silhouette within normal limits. Mediastinal contours normal. Trachea midline. No airspace disease or effusion. Monitoring leads project over the chest.  IMPRESSION: No active cardiopulmonary disease.   Electronically Signed   By: Dereck Ligas M.D.   On: 07/21/2015 18:40     EKG  Interpretation   Date/Time:  Sunday July 21 2015 17:54:28 EDT Ventricular Rate:  76 PR Interval:  150 QRS Duration: 95 QT Interval:  383 QTC Calculation: 431 R Axis:   -23 Text Interpretation:  Sinus rhythm Multiple premature complexes, vent   Borderline left axis deviation No significant change was found Confirmed  by Wyvonnia Dusky  MD, Rook Maue [phone]) on 07/21/2015 6:06:55 PM      MDM   Final diagnoses:  Urinary tract infection without hematuria, site unspecified  Hyponatremia   recurrent visits for ongoing generalized weakness and fatigue. Treated for UTI 2 weeks ago. No focal neurological deficit.  Labs show mild hyponatremia of 128 which is decreased from 131 3 days ago. Her hydrochlorothiazide was stopped.   After IV fluids, sodium has improved to 130. Patient with no complaints. She is tolerating by mouth and ambulatory. Blood pressure 167/67. Orthostatics are negative.  We'll treat UTI. Culture from July 18 did not grow any specific colony. She has PCP follow-up tomorrow. Maintain blood pressure regimen at this time. Has PCP followup  tomorrow. Return precautions discussed.   Ezequiel Essex, MD 07/21/15 [phone]

## 2015-07-21 NOTE — ED Notes (Signed)
Pt reports weakness that has continued since last visit.

## 2015-07-21 NOTE — ED Notes (Signed)
Patient unable to give a urine sample at this time.

## 2015-07-22 DIAGNOSIS — M1990 Unspecified osteoarthritis, unspecified site: Secondary | ICD-10-CM | POA: Diagnosis not present

## 2015-07-22 DIAGNOSIS — M609 Myositis, unspecified: Secondary | ICD-10-CM | POA: Diagnosis not present

## 2015-07-22 DIAGNOSIS — I10 Essential (primary) hypertension: Secondary | ICD-10-CM | POA: Diagnosis not present

## 2015-07-22 DIAGNOSIS — E871 Hypo-osmolality and hyponatremia: Secondary | ICD-10-CM | POA: Diagnosis not present

## 2015-07-22 DIAGNOSIS — N390 Urinary tract infection, site not specified: Secondary | ICD-10-CM | POA: Diagnosis not present

## 2015-07-23 LAB — URINE CULTURE

## 2015-08-05 ENCOUNTER — Other Ambulatory Visit (HOSPITAL_COMMUNITY): Payer: Self-pay | Admitting: Internal Medicine

## 2015-08-05 DIAGNOSIS — N390 Urinary tract infection, site not specified: Secondary | ICD-10-CM | POA: Diagnosis not present

## 2015-08-05 DIAGNOSIS — R5383 Other fatigue: Secondary | ICD-10-CM | POA: Diagnosis not present

## 2015-08-05 DIAGNOSIS — I10 Essential (primary) hypertension: Secondary | ICD-10-CM | POA: Diagnosis not present

## 2015-08-05 DIAGNOSIS — M609 Myositis, unspecified: Secondary | ICD-10-CM | POA: Diagnosis not present

## 2015-08-05 DIAGNOSIS — Z1231 Encounter for screening mammogram for malignant neoplasm of breast: Secondary | ICD-10-CM

## 2015-08-05 DIAGNOSIS — R300 Dysuria: Secondary | ICD-10-CM | POA: Diagnosis not present

## 2015-08-05 DIAGNOSIS — M1990 Unspecified osteoarthritis, unspecified site: Secondary | ICD-10-CM | POA: Diagnosis not present

## 2015-08-05 DIAGNOSIS — E559 Vitamin D deficiency, unspecified: Secondary | ICD-10-CM | POA: Diagnosis not present

## 2015-08-05 DIAGNOSIS — R42 Dizziness and giddiness: Secondary | ICD-10-CM | POA: Diagnosis not present

## 2015-08-05 DIAGNOSIS — E780 Pure hypercholesterolemia: Secondary | ICD-10-CM | POA: Diagnosis not present

## 2015-08-19 ENCOUNTER — Other Ambulatory Visit: Payer: Self-pay

## 2015-08-19 ENCOUNTER — Encounter (HOSPITAL_COMMUNITY): Payer: PRIVATE HEALTH INSURANCE

## 2015-08-19 DIAGNOSIS — I10 Essential (primary) hypertension: Secondary | ICD-10-CM | POA: Diagnosis not present

## 2015-08-19 DIAGNOSIS — M1990 Unspecified osteoarthritis, unspecified site: Secondary | ICD-10-CM | POA: Diagnosis not present

## 2015-08-19 DIAGNOSIS — R0602 Shortness of breath: Secondary | ICD-10-CM | POA: Diagnosis not present

## 2015-08-21 ENCOUNTER — Ambulatory Visit (HOSPITAL_COMMUNITY)
Admission: RE | Admit: 2015-08-21 | Discharge: 2015-08-21 | Disposition: A | Discharge status: Home / Self Care | Payer: Medicare Other | Source: Ambulatory Visit | Attending: Internal Medicine | Admitting: Internal Medicine

## 2015-08-21 DIAGNOSIS — R0600 Dyspnea, unspecified: Secondary | ICD-10-CM | POA: Diagnosis not present

## 2015-08-21 DIAGNOSIS — I371 Nonrheumatic pulmonary valve insufficiency: Secondary | ICD-10-CM | POA: Diagnosis not present

## 2015-08-21 DIAGNOSIS — I10 Essential (primary) hypertension: Secondary | ICD-10-CM | POA: Diagnosis not present

## 2015-08-21 DIAGNOSIS — I081 Rheumatic disorders of both mitral and tricuspid valves: Secondary | ICD-10-CM | POA: Insufficient documentation

## 2015-08-28 ENCOUNTER — Ambulatory Visit (HOSPITAL_COMMUNITY)
Admission: RE | Admit: 2015-08-28 | Discharge: 2015-08-28 | Disposition: A | Discharge status: Home / Self Care | Payer: Medicare Other | Source: Ambulatory Visit | Attending: Internal Medicine | Admitting: Internal Medicine

## 2015-08-28 DIAGNOSIS — Z1231 Encounter for screening mammogram for malignant neoplasm of breast: Secondary | ICD-10-CM | POA: Diagnosis not present

## 2015-08-28 DIAGNOSIS — Z853 Personal history of malignant neoplasm of breast: Secondary | ICD-10-CM | POA: Insufficient documentation

## 2015-08-28 DIAGNOSIS — Z923 Personal history of irradiation: Secondary | ICD-10-CM | POA: Insufficient documentation

## 2015-08-30 ENCOUNTER — Emergency Department (HOSPITAL_COMMUNITY): Payer: Medicare Other

## 2015-08-30 ENCOUNTER — Emergency Department (HOSPITAL_COMMUNITY)
Admission: EM | Admit: 2015-08-30 | Discharge: 2015-08-30 | Disposition: A | Discharge status: Home / Self Care | Payer: Medicare Other | Attending: Emergency Medicine | Admitting: Emergency Medicine

## 2015-08-30 ENCOUNTER — Encounter (HOSPITAL_COMMUNITY): Payer: Self-pay | Admitting: Emergency Medicine

## 2015-08-30 DIAGNOSIS — Z91040 Latex allergy status: Secondary | ICD-10-CM | POA: Insufficient documentation

## 2015-08-30 DIAGNOSIS — R531 Weakness: Secondary | ICD-10-CM | POA: Insufficient documentation

## 2015-08-30 DIAGNOSIS — Z7982 Long term (current) use of aspirin: Secondary | ICD-10-CM | POA: Diagnosis not present

## 2015-08-30 DIAGNOSIS — R5383 Other fatigue: Secondary | ICD-10-CM | POA: Insufficient documentation

## 2015-08-30 DIAGNOSIS — Z79899 Other long term (current) drug therapy: Secondary | ICD-10-CM | POA: Diagnosis not present

## 2015-08-30 DIAGNOSIS — Z86018 Personal history of other benign neoplasm: Secondary | ICD-10-CM | POA: Insufficient documentation

## 2015-08-30 DIAGNOSIS — I10 Essential (primary) hypertension: Secondary | ICD-10-CM | POA: Insufficient documentation

## 2015-08-30 DIAGNOSIS — R0602 Shortness of breath: Secondary | ICD-10-CM | POA: Diagnosis present

## 2015-08-30 HISTORY — DX: Malignant (primary) neoplasm, unspecified: C80.1

## 2015-08-30 LAB — URINALYSIS, ROUTINE W REFLEX MICROSCOPIC
Bilirubin Urine: NEGATIVE
GLUCOSE, UA: 100 mg/dL — AB
Hgb urine dipstick: NEGATIVE
KETONES UR: NEGATIVE mg/dL
LEUKOCYTES UA: NEGATIVE
NITRITE: NEGATIVE
PH: 6.5 (ref 5.0–8.0)
Protein, ur: NEGATIVE mg/dL
Urobilinogen, UA: 0.2 mg/dL (ref 0.0–1.0)

## 2015-08-30 LAB — CBC WITH DIFFERENTIAL/PLATELET
BASOS PCT: 0 % (ref 0–1)
Basophils Absolute: 0 10*3/uL (ref 0.0–0.1)
Eosinophils Absolute: 0 10*3/uL (ref 0.0–0.7)
Eosinophils Relative: 0 % (ref 0–5)
HCT: 41.3 % (ref 36.0–46.0)
HEMOGLOBIN: 13.6 g/dL (ref 12.0–15.0)
LYMPHS ABS: 2.1 10*3/uL (ref 0.7–4.0)
LYMPHS PCT: 43 % (ref 12–46)
MCH: 29.2 pg (ref 26.0–34.0)
MCHC: 32.9 g/dL (ref 30.0–36.0)
MCV: 88.8 fL (ref 78.0–100.0)
MONO ABS: 0.3 10*3/uL (ref 0.1–1.0)
Monocytes Relative: 6 % (ref 3–12)
NEUTROS ABS: 2.5 10*3/uL (ref 1.7–7.7)
NEUTROS PCT: 51 % (ref 43–77)
Platelets: 352 10*3/uL (ref 150–400)
RBC: 4.65 MIL/uL (ref 3.87–5.11)
RDW: 14.8 % (ref 11.5–15.5)
WBC: 4.9 10*3/uL (ref 4.0–10.5)

## 2015-08-30 LAB — COMPREHENSIVE METABOLIC PANEL
ALBUMIN: 4.1 g/dL (ref 3.5–5.0)
ALK PHOS: 58 U/L (ref 38–126)
ALT: 21 U/L (ref 14–54)
ANION GAP: 11 (ref 5–15)
AST: 27 U/L (ref 15–41)
BUN: 9 mg/dL (ref 6–20)
CALCIUM: 9.2 mg/dL (ref 8.9–10.3)
CO2: 28 mmol/L (ref 22–32)
Chloride: 92 mmol/L — ABNORMAL LOW (ref 101–111)
Creatinine, Ser: 1 mg/dL (ref 0.44–1.00)
GFR calc Af Amer: 58 mL/min — ABNORMAL LOW (ref >60)
GFR, EST NON AFRICAN AMERICAN: 50 mL/min — AB (ref >60)
GLUCOSE: 188 mg/dL — AB (ref 65–99)
Potassium: 3.8 mmol/L (ref 3.5–5.1)
Sodium: 131 mmol/L — ABNORMAL LOW (ref 135–145)
TOTAL PROTEIN: 8 g/dL (ref 6.5–8.1)
Total Bilirubin: 0.5 mg/dL (ref 0.3–1.2)

## 2015-08-30 LAB — TSH: TSH: 1.269 u[IU]/mL (ref 0.350–4.500)

## 2015-08-30 LAB — TROPONIN I

## 2015-08-30 MED ORDER — HYDROXYZINE HCL 25 MG PO TABS: ORAL_TABLET | ORAL | Status: DC

## 2015-08-30 NOTE — Discharge Instructions (Signed)
Follow up with your md next week.   Your md can discuss your thyroid tests

## 2015-08-30 NOTE — ED Notes (Signed)
Patient c/o generalized fatigue. Per patient has been fatigue since July and this is 4th visit to ER. Patient states she has been treated for UTI, Dehydration, Hypokalemia, low chloride, and hypertension. Patient states they have changed her blood pressure medication twice and she has not felt right since. Patient also reports some shortness of breath which she states started this morning.. Denies any chest pain. Per patient has had extensive cardiac workup by PCP with no significant findings. Denies any fevers, coughing, or swelling. Per patient was on diuretic until she became dehydrated and doctor took her off.

## 2015-08-30 NOTE — ED Provider Notes (Signed)
CSN: [id number]     Arrival date & time 08/30/15  5631 History  This chart was scribed for Milton Ferguson, MD by Eustaquio Maize, ED Scribe. This patient was seen in room APA04/APA04 and the patient's care was started at 9:08 AM.  Chief Complaint  Patient presents with  . Fatigue   Patient is a 81 y.o. female presenting with shortness of breath. The history is provided by the patient. No language interpreter was used.  Shortness of Breath Onset quality:  Gradual Progression:  Unchanged Chronicity:  New Worsened by:  Activity Associated symptoms: no abdominal pain, no chest pain, no cough, no fever, no headaches and no rash   Risk factors: hx of cancer   Risk factors: no tobacco use      HPI Comments: SEFORA TIETJE is a 81 y.o. female with hx HTN who presents to the Emergency Department complaining of gradual onset, shortness of breath that began this morning upon waking up. Pt states that the shortness of breath is exacerbated with walking. Denies pain of any kind. Pt has never had symptoms like this in the past. Denies swelling in lower extremities, fever, chills, cough, or any other associated symptoms.  Family states that pt has been complaining of generalized fatigue for the past month.   Past Medical History  Diagnosis Date  . Hypertension   . Cancer    Past Surgical History  Procedure Laterality Date  . Breast lumpectomy      right side  . Tonsillectomy    . Dilation and curettage of uterus    . Foot surgery     Family History  Problem Relation Age of Onset  . Heart attack Mother   . Cancer Mother    Social History  Substance Use Topics  . Smoking status: Never Smoker   . Smokeless tobacco: Never Used  . Alcohol Use: No   OB History    Gravida Para Term Preterm AB TAB SAB Ectopic Multiple Living   2 2 2       2      Review of Systems  Constitutional: Positive for fatigue. Negative for fever and appetite change.  HENT: Negative for congestion, ear discharge and  sinus pressure.   Eyes: Negative for discharge.  Respiratory: Positive for shortness of breath. Negative for cough.   Cardiovascular: Negative for chest pain and leg swelling.  Gastrointestinal: Negative for abdominal pain and diarrhea.  Genitourinary: Negative for frequency and hematuria.  Musculoskeletal: Negative for back pain.  Skin: Negative for rash.  Neurological: Negative for seizures and headaches.  Psychiatric/Behavioral: Negative for hallucinations.   Allergies  Latex; Motrin; and Bextra  Home Medications   Prior to Admission medications   Medication Sig Start Date End Date Taking? Authorizing Provider  acetaminophen (TYLENOL) 500 MG tablet Take 1,000 mg by mouth daily as needed for mild pain.   Yes Historical Provider, MD  aspirin EC 81 MG tablet Take 81 mg by mouth daily.   Yes Historical Provider, MD  Calcium Carbonate-Vitamin D (CALCIUM 600+D3 PO) Take 1 tablet by mouth 2 (two) times daily.   Yes Historical Provider, MD  carbamide peroxide (DEBROX) 6.5 % otic solution Place 1 drop into both ears daily as needed (ear wax buildup).   Yes Historical Provider, MD  cycloSPORINE (RESTASIS) 0.05 % ophthalmic emulsion Place 1 drop into both eyes 2 (two) times daily.   Yes Historical Provider, MD  felodipine (PLENDIL) 5 MG 24 hr tablet Take 10 mg by mouth daily.  Yes Historical Provider, MD  fluticasone (FLONASE) 50 MCG/ACT nasal spray Place 2 sprays into the nose daily as needed for allergies.   Yes Historical Provider, MD  Liniments (SALONPAS PAIN RELIEF PATCH) PADS Apply 1 each topically daily as needed (pain).   Yes Historical Provider, MD  losartan (COZAAR) 100 MG tablet Take 100 mg by mouth daily.   Yes Historical Provider, MD  Multiple Vitamins-Minerals (CENTRUM ADULTS PO) Take 1 tablet by mouth daily.   Yes Historical Provider, MD  Omega-3 Fatty Acids (FISH OIL TRIPLE STRENGTH) 1400 MG CAPS Take 1 tablet by mouth daily.   Yes Historical Provider, MD  Polyethyl  Glycol-Propyl Glycol (SYSTANE OP) Apply 1 drop to eye 4 (four) times daily.   Yes Historical Provider, MD  tretinoin (RETIN-A) 0.025 % cream Apply 1 application topically at bedtime.  06/27/15  Yes Historical Provider, MD  cephALEXin (KEFLEX) 500 MG capsule Take 1 capsule (500 mg total) by mouth 3 (three) times daily. Patient not taking: Reported on 08/30/2015 07/21/15   Ezequiel Essex, MD   Triage Vitals: BP 223/87 mmHg  Pulse 81  Temp(Src) 97.7 F (36.5 C) (Oral)  Resp 18  Ht 5\' 3"  (1.6 m)  Wt 135 lb (61.236 kg)  BMI 23.92 kg/m2  SpO2 100%   Physical Exam  Constitutional: She is oriented to person, place, and time. She appears well-developed.  HENT:  Head: Normocephalic.  Eyes: Conjunctivae and EOM are normal. No scleral icterus.  Neck: Neck supple. No thyromegaly present.  Cardiovascular: Normal rate and regular rhythm.  Exam reveals no gallop and no friction rub.   No murmur heard. Pulmonary/Chest: No stridor. She has no wheezes. She has no rales. She exhibits no tenderness.  Abdominal: She exhibits no distension. There is no tenderness. There is no rebound.  Musculoskeletal: Normal range of motion. She exhibits no edema.  Lymphadenopathy:    She has no cervical adenopathy.  Neurological: She is oriented to person, place, and time. She exhibits normal muscle tone. Coordination normal.  Skin: No rash noted. No erythema.  Psychiatric: She has a normal mood and affect. Her behavior is normal.    ED Course  Procedures (including critical care time)  DIAGNOSTIC STUDIES: Oxygen Saturation is 100% on RA, normal by my interpretation.    COORDINATION OF CARE: 9:13 AM-Discussed treatment plan which includes CXR with pt at bedside and pt agreed to plan.   Labs Review Labs Reviewed  COMPREHENSIVE METABOLIC PANEL - Abnormal; Notable for the following:    Sodium 131 (*)    Chloride 92 (*)    Glucose, Bld 188 (*)    GFR calc non Af Amer 50 (*)    GFR calc Af Amer 58 (*)    All  other components within normal limits  URINALYSIS, ROUTINE W REFLEX MICROSCOPIC (NOT AT [facility]) - Abnormal; Notable for the following:    Specific Gravity, Urine <1.005 (*)    Glucose, UA 100 (*)    All other components within normal limits  CBC WITH DIFFERENTIAL/PLATELET  TROPONIN I    Imaging Review Dg Chest 2 View  08/30/2015   CLINICAL DATA:  Short of breath.  Fatigue  EXAM: CHEST  2 VIEW  COMPARISON:  07/21/2015  FINDINGS: Mild cardiac enlargement. Negative for heart failure. Vascularity normal. Atherosclerotic calcification in the aortic arch is mild.  Lungs are clear without infiltrate or effusion. No mass or adenopathy.  IMPRESSION: No active cardiopulmonary disease.   Electronically Signed   By: Franchot Gallo M.D.  On: 08/30/2015 10:29   Mm Screening Breast Tomo Bilateral  08/28/2015   CLINICAL DATA:  Screening. History of right breast cancer in 2000 status post lumpectomy and radiation therapy.  EXAM: DIGITAL SCREENING BILATERAL MAMMOGRAM WITH 3D TOMO WITH CAD  COMPARISON:  Previous exam(s).  ACR Breast Density Category b: There are scattered areas of fibroglandular density.  FINDINGS: There are no findings suspicious for malignancy. There are stable postsurgical changes within the right breast. Images were processed with CAD.  IMPRESSION: No mammographic evidence of malignancy. A result letter of this screening mammogram will be mailed directly to the patient.  RECOMMENDATION: Screening mammogram in one year. (Code:SM-B-01Y)  BI-RADS CATEGORY  2: Benign.   Electronically Signed   By: Franki Cabot M.D.   On: 08/28/2015 15:31   I have personally reviewed and evaluated these images and lab results as part of my medical decision-making.   EKG Interpretation None      MDM   Final diagnoses:  None    Patient complains of weakness with some shortness breath. Labs EKG x-rays are all unremarkable. She had a recent echo by her cardiologist which was normal. I will send some thyroid  studies and have patient follow-up with her PCP. Doubt symptoms are related to CAD.The chart was scribed for me under my direct supervision.  I personally performed the history, physical, and medical decision making and all procedures in the evaluation of this patient.Milton Ferguson, MD 08/31/15 1027

## 2015-08-30 NOTE — ED Notes (Signed)
Patient given discharge instruction, verbalized understand. IV removed, band aid applied. Patient ambulatory out of the department.  

## 2015-09-01 LAB — T4: T4, Total: 9.6 ug/dL (ref 4.5–12.0)

## 2015-09-02 DIAGNOSIS — R531 Weakness: Secondary | ICD-10-CM | POA: Diagnosis not present

## 2015-09-02 DIAGNOSIS — R7309 Other abnormal glucose: Secondary | ICD-10-CM | POA: Diagnosis not present

## 2015-09-02 DIAGNOSIS — R739 Hyperglycemia, unspecified: Secondary | ICD-10-CM | POA: Diagnosis not present

## 2015-09-02 DIAGNOSIS — I10 Essential (primary) hypertension: Secondary | ICD-10-CM | POA: Diagnosis not present

## 2015-09-11 ENCOUNTER — Encounter: Payer: Self-pay | Admitting: Cardiology

## 2015-09-11 ENCOUNTER — Ambulatory Visit (INDEPENDENT_AMBULATORY_CARE_PROVIDER_SITE_OTHER): Payer: Medicare Other | Admitting: Cardiology

## 2015-09-11 VITALS — BP 182/74 | HR 85 | Ht 63.0 in | Wt 132.0 lb

## 2015-09-11 DIAGNOSIS — I10 Essential (primary) hypertension: Secondary | ICD-10-CM | POA: Diagnosis not present

## 2015-09-11 DIAGNOSIS — R5382 Chronic fatigue, unspecified: Secondary | ICD-10-CM

## 2015-09-11 MED ORDER — TRIAMTERENE-HCTZ 37.5-25 MG PO TABS: ORAL_TABLET | ORAL | Status: DC

## 2015-09-11 NOTE — Patient Instructions (Signed)
Your physician recommends that you schedule a follow-up appointment in: 1 month with Dr Harl Bowie   Start Maxzide 37.5-25 mg , take 1/2 tablet daily   Please get lab work in 2 weeks:  bmet and magnesium   Keep BP log for 2 weeks and then call with readings       Thank you for choosing [facility] !

## 2015-09-11 NOTE — Progress Notes (Signed)
Patient ID: Charlotte [name], female   DOB: 09-Feb-1928, 81 y.o.   MRN: [id number]     Clinical Summary Charlotte [name] is a 81 y.o.female seen today as a new patient for the following medical problems.  1. Generalized fatigue - seen in Er 08/30/15 with fatigue and SOB - 07/2015 echo LVEF 55-60%, no WMAs, grade I diastolic dysfunction, mild MR, PASP 49. Normal RV size and function.  - 08/2015 CXR no acute process. EKG SR with LVH.  - 08/2015 normal TSH, Hgb, trop neg x1.   - reports generalized fatigue started a few months ago. No chest pain. No LE edema. No significant SOB or DOE. No palpitations.   2. HTN - taken off diuretic previously due to low K and dehydration she reports. Since then bp's have been elevated. Clonidine caused fatigue and was stopped. Felodopine 10mg  caused weakness, changed to 5mg  daily which she is tolerating . - checks bp at home, typically 160s/70s but can he higher.  Past Medical History  Diagnosis Date  . Hypertension   . Cancer      Allergies  Allergen Reactions  . Latex Swelling  . Motrin [Ibuprofen] Other (See Comments)    Stomach pain  . Bextra [Valdecoxib] Other (See Comments)    Stomach Pains     Current Outpatient Prescriptions  Medication Sig Dispense Refill  . acetaminophen (TYLENOL) 500 MG tablet Take 1,000 mg by mouth daily as needed for mild pain.    Marland Kitchen aspirin EC 81 MG tablet Take 81 mg by mouth daily.    . Calcium Carbonate-Vitamin D (CALCIUM 600+D3 PO) Take 1 tablet by mouth 2 (two) times daily.    . carbamide peroxide (DEBROX) 6.5 % otic solution Place 1 drop into both ears daily as needed (ear wax buildup).    . cephALEXin (KEFLEX) 500 MG capsule Take 1 capsule (500 mg total) by mouth 3 (three) times daily. (Patient not taking: Reported on 08/30/2015) 30 capsule 0  . cycloSPORINE (RESTASIS) 0.05 % ophthalmic emulsion Place 1 drop into both eyes 2 (two) times daily.    . felodipine (PLENDIL) 5 MG 24 hr tablet Take 10 mg by mouth daily.     .  fluticasone (FLONASE) 50 MCG/ACT nasal spray Place 2 sprays into the nose daily as needed for allergies.    . hydrOXYzine (ATARAX/VISTARIL) 25 MG tablet Take one pill at bedtime to help with sleep 30 tablet 0  . Liniments (SALONPAS PAIN RELIEF PATCH) PADS Apply 1 each topically daily as needed (pain).    Marland Kitchen losartan (COZAAR) 100 MG tablet Take 100 mg by mouth daily.    . Multiple Vitamins-Minerals (CENTRUM ADULTS PO) Take 1 tablet by mouth daily.    . Omega-3 Fatty Acids (FISH OIL TRIPLE STRENGTH) 1400 MG CAPS Take 1 tablet by mouth daily.    Vladimir Faster Glycol-Propyl Glycol (SYSTANE OP) Apply 1 drop to eye 4 (four) times daily.    Marland Kitchen tretinoin (RETIN-A) 0.025 % cream Apply 1 application topically at bedtime.   3   No current facility-administered medications for this visit.     Past Surgical History  Procedure Laterality Date  . Breast lumpectomy      right side  . Tonsillectomy    . Dilation and curettage of uterus    . Foot surgery       Allergies  Allergen Reactions  . Latex Swelling  . Motrin [Ibuprofen] Other (See Comments)    Stomach pain  . Bextra [Valdecoxib] Other (See Comments)  Stomach Pains      Family History  Problem Relation Age of Onset  . Heart attack Mother   . Cancer Mother      Social History Charlotte [name] reports that she has never smoked. She has never used smokeless tobacco. Charlotte [name] reports that she does not drink alcohol.   Review of Systems CONSTITUTIONAL: +fatigue  HEENT: Eyes: No visual loss, blurred vision, double vision or yellow sclerae.No hearing loss, sneezing, congestion, runny nose or sore throat.  SKIN: No rash or itching.  CARDIOVASCULAR: per HPI RESPIRATORY: No shortness of breath, cough or sputum.  GASTROINTESTINAL: No anorexia, nausea, vomiting or diarrhea. No abdominal pain or blood.  GENITOURINARY: No burning on urination, no polyuria NEUROLOGICAL: No headache, dizziness, syncope, paralysis, ataxia, numbness or tingling  in the extremities. No change in bowel or bladder control.  MUSCULOSKELETAL: No muscle, back pain, joint pain or stiffness.  LYMPHATICS: No enlarged nodes. No history of splenectomy.  PSYCHIATRIC: No history of depression or anxiety.  ENDOCRINOLOGIC: No reports of sweating, cold or heat intolerance. No polyuria or polydipsia.  Marland Kitchen   Physical Examination Filed Vitals:   09/11/15 1337  BP: 182/74  Pulse: 85   Filed Vitals:   09/11/15 1337  Height: 5\' 3"  (1.6 m)  Weight: 132 lb (59.875 kg)    Gen: resting comfortably, no acute distress HEENT: no scleral icterus, pupils equal round and reactive, no palptable cervical adenopathy,  CV: RRR, no m/r/g, no jvd Resp: Clear to auscultation bilaterally GI: abdomen is soft, non-tender, non-distended, normal bowel sounds, no hepatosplenomegaly MSK: extremities are warm, no edema.  Skin: warm, no rash Neuro:  no focal deficits Psych: appropriate affect    Assessment and Plan  1. Generalized fatigue - unclear etiology. Symptoms are not cardiac in description as she has not chest pain, SOB, or DOE. Primarily just feels drained all the time. Normal echo.  - follow symptoms at this time, can consider modified Bruce protocol GXT with O2 sat  to evaluate her exercise capacity if symptoms progress  2. HTN - uncontrolled, management has been difficult due to medicatio side effects - will try low dose maxzide and follow bp's, check BMET and Mg in 2 weeks. - she will drop off bp log in 2 weeks.   F/u 1 month      Arnoldo Lenis, M.D.

## 2015-09-25 DIAGNOSIS — I10 Essential (primary) hypertension: Secondary | ICD-10-CM | POA: Diagnosis not present

## 2015-09-26 LAB — BASIC METABOLIC PANEL
BUN: 12 mg/dL (ref 7–25)
CHLORIDE: 91 mmol/L — AB (ref 98–110)
CO2: 30 mmol/L (ref 20–31)
Calcium: 10 mg/dL (ref 8.6–10.4)
Creat: 0.9 mg/dL — ABNORMAL HIGH (ref 0.60–0.88)
GLUCOSE: 89 mg/dL (ref 65–99)
POTASSIUM: 4.5 mmol/L (ref 3.5–5.3)
SODIUM: 129 mmol/L — AB (ref 135–146)

## 2015-09-26 LAB — MAGNESIUM: Magnesium: 1.7 mg/dL (ref 1.5–2.5)

## 2015-09-27 ENCOUNTER — Telehealth: Payer: Self-pay | Admitting: Cardiology

## 2015-09-27 NOTE — Telephone Encounter (Signed)
-----   Message from Arnoldo Lenis, MD sent at 09/26/2015  2:11 PM EDT ----- Labs look ok. Sodium is a little low but this has been the case for quite some time for her and it stable. Continue current meds  Charlotte Abts MD

## 2015-09-27 NOTE — Telephone Encounter (Signed)
Pt aware, routed to pcp 

## 2015-09-27 NOTE — Telephone Encounter (Signed)
Returning call to Staci °

## 2015-10-03 ENCOUNTER — Telehealth: Payer: Self-pay

## 2015-10-03 DIAGNOSIS — M549 Dorsalgia, unspecified: Secondary | ICD-10-CM | POA: Diagnosis not present

## 2015-10-03 DIAGNOSIS — Z23 Encounter for immunization: Secondary | ICD-10-CM | POA: Diagnosis not present

## 2015-10-03 DIAGNOSIS — M5126 Other intervertebral disc displacement, lumbar region: Secondary | ICD-10-CM | POA: Diagnosis not present

## 2015-10-03 NOTE — Telephone Encounter (Signed)
I spoke to PT and let her know her bp were ok and to continue taking her current medications. She voiced understanding.

## 2015-10-03 NOTE — Telephone Encounter (Signed)
-----   Message from Arnoldo Lenis, MD sent at 10/02/2015 10:21 AM EDT ----- BP log reviewed. Numbers are somewhat up and down but overall look ok. Continue current meds  Zandra Abts MD

## 2015-10-10 ENCOUNTER — Telehealth: Payer: Self-pay | Admitting: Cardiology

## 2015-10-10 MED ORDER — HYDRALAZINE HCL 25 MG PO TABS: 25.0000 mg | ORAL_TABLET | Freq: Two times a day (BID) | ORAL | Status: DC

## 2015-10-10 NOTE — Telephone Encounter (Signed)
Patient states that she is having side effects to [facility] / tg

## 2015-10-10 NOTE — Telephone Encounter (Signed)
Called pt and she states that since starting maxide she has felt very weak, cannot hardly hold her head up."like she is dehydrated" Her bp has been fluctuating up & down she states. Right now it is 175/86 with a pulse of 71. She denies having any sob or cp. She would like to have a different medication because she states " this one is making her feel entirely too bad everyday." She has no energy to do anything.

## 2015-10-10 NOTE — Telephone Encounter (Signed)
Called pt and informed her of the medication change. She voiced understanding as I explained for her to take 25 mg two times daily. Sent rx in to [facility].

## 2015-10-10 NOTE — Telephone Encounter (Signed)
I reviewed Dr. Nelly Laurence office note. It appears she has intolerance to multiple medications. Can stop Maxzide and try hydralazine at a decreased frequency of 25 mg bid.

## 2015-10-11 ENCOUNTER — Emergency Department (HOSPITAL_COMMUNITY): Payer: Medicare Other

## 2015-10-11 ENCOUNTER — Telehealth: Payer: Self-pay

## 2015-10-11 ENCOUNTER — Emergency Department (HOSPITAL_COMMUNITY)
Admission: EM | Admit: 2015-10-11 | Discharge: 2015-10-11 | Disposition: A | Discharge status: Home / Self Care | Payer: Medicare Other | Attending: Emergency Medicine | Admitting: Emergency Medicine

## 2015-10-11 ENCOUNTER — Encounter (HOSPITAL_COMMUNITY): Payer: Self-pay | Admitting: *Deleted

## 2015-10-11 ENCOUNTER — Telehealth: Payer: Self-pay | Admitting: Cardiology

## 2015-10-11 DIAGNOSIS — Z7982 Long term (current) use of aspirin: Secondary | ICD-10-CM | POA: Insufficient documentation

## 2015-10-11 DIAGNOSIS — R5383 Other fatigue: Secondary | ICD-10-CM | POA: Diagnosis present

## 2015-10-11 DIAGNOSIS — Z91040 Latex allergy status: Secondary | ICD-10-CM | POA: Insufficient documentation

## 2015-10-11 DIAGNOSIS — R5382 Chronic fatigue, unspecified: Secondary | ICD-10-CM

## 2015-10-11 DIAGNOSIS — Z853 Personal history of malignant neoplasm of breast: Secondary | ICD-10-CM | POA: Diagnosis not present

## 2015-10-11 DIAGNOSIS — I10 Essential (primary) hypertension: Secondary | ICD-10-CM | POA: Diagnosis not present

## 2015-10-11 DIAGNOSIS — Z79899 Other long term (current) drug therapy: Secondary | ICD-10-CM | POA: Diagnosis not present

## 2015-10-11 DIAGNOSIS — M6281 Muscle weakness (generalized): Secondary | ICD-10-CM | POA: Diagnosis not present

## 2015-10-11 DIAGNOSIS — R0602 Shortness of breath: Secondary | ICD-10-CM | POA: Diagnosis not present

## 2015-10-11 HISTORY — DX: Other fatigue: R53.83

## 2015-10-11 LAB — CBC WITH DIFFERENTIAL/PLATELET
BASOS ABS: 0 10*3/uL (ref 0.0–0.1)
BASOS PCT: 0 %
EOS ABS: 0 10*3/uL (ref 0.0–0.7)
EOS PCT: 0 %
HEMATOCRIT: 37.4 % (ref 36.0–46.0)
Hemoglobin: 12.6 g/dL (ref 12.0–15.0)
Lymphocytes Relative: 32 %
Lymphs Abs: 1.6 10*3/uL (ref 0.7–4.0)
MCH: 29.2 pg (ref 26.0–34.0)
MCHC: 33.7 g/dL (ref 30.0–36.0)
MCV: 86.8 fL (ref 78.0–100.0)
MONO ABS: 0.4 10*3/uL (ref 0.1–1.0)
MONOS PCT: 8 %
NEUTROS ABS: 3 10*3/uL (ref 1.7–7.7)
Neutrophils Relative %: 60 %
PLATELETS: 293 10*3/uL (ref 150–400)
RBC: 4.31 MIL/uL (ref 3.87–5.11)
RDW: 14 % (ref 11.5–15.5)
WBC: 5.1 10*3/uL (ref 4.0–10.5)

## 2015-10-11 LAB — BASIC METABOLIC PANEL
ANION GAP: 8 (ref 5–15)
BUN: 15 mg/dL (ref 6–20)
CALCIUM: 9.5 mg/dL (ref 8.9–10.3)
CO2: 30 mmol/L (ref 22–32)
CREATININE: 0.94 mg/dL (ref 0.44–1.00)
Chloride: 92 mmol/L — ABNORMAL LOW (ref 101–111)
GFR calc Af Amer: >60 mL/min (ref >60)
GFR, EST NON AFRICAN AMERICAN: 54 mL/min — AB (ref >60)
GLUCOSE: 121 mg/dL — AB (ref 65–99)
Potassium: 3.9 mmol/L (ref 3.5–5.1)
SODIUM: 130 mmol/L — AB (ref 135–145)

## 2015-10-11 LAB — URINE MICROSCOPIC-ADD ON

## 2015-10-11 LAB — URINALYSIS, ROUTINE W REFLEX MICROSCOPIC
BILIRUBIN URINE: NEGATIVE
Glucose, UA: NEGATIVE mg/dL
HGB URINE DIPSTICK: NEGATIVE
KETONES UR: NEGATIVE mg/dL
NITRITE: NEGATIVE
Protein, ur: NEGATIVE mg/dL
SPECIFIC GRAVITY, URINE: 1.01 (ref 1.005–1.030)
UROBILINOGEN UA: 0.2 mg/dL (ref 0.0–1.0)
pH: 7 (ref 5.0–8.0)

## 2015-10-11 LAB — TROPONIN I: Troponin I: <0.03 ng/mL (ref <0.031)

## 2015-10-11 LAB — LACTIC ACID, PLASMA: Lactic Acid, Venous: 0.7 mmol/L (ref 0.5–2.0)

## 2015-10-11 LAB — BRAIN NATRIURETIC PEPTIDE: B NATRIURETIC PEPTIDE 5: 17 pg/mL (ref 0.0–100.0)

## 2015-10-11 NOTE — Telephone Encounter (Signed)
See my note,pt went to ED for evaluation

## 2015-10-11 NOTE — ED Notes (Signed)
Ambulated around ED nursing station times once on RA, patients stats 99% prior to starting and remained 99% through ambulation. Patient had no SOB only complaint was it made her "tired".

## 2015-10-11 NOTE — Discharge Instructions (Signed)
°Emergency Department Resource Guide °1) Find a Doctor and Pay Out of Pocket °Although you won't have to find out who is covered by your insurance plan, it is a good idea to ask around and get recommendations. You will then need to call the office and see if the doctor you have chosen will accept you as a new patient and what types of options they offer for patients who are self-pay. Some doctors offer discounts or will set up payment plans for their patients who do not have insurance, but you will need to ask so you aren't surprised when you get to your appointment. ° °2) Contact Your Local Health Department °Not all health departments have doctors that can see patients for sick visits, but many do, so it is worth a call to see if yours does. If you don't know where your local health department is, you can check in your phone book. The CDC also has a tool to help you locate your state's health department, and many state websites also have listings of all of their local health departments. ° °3) Find a Walk-in Clinic °If your illness is not likely to be very severe or complicated, you may want to try a walk in clinic. These are popping up all over the country in pharmacies, drugstores, and shopping centers. They're usually staffed by nurse practitioners or physician assistants that have been trained to treat common illnesses and complaints. They're usually fairly quick and inexpensive. However, if you have serious medical issues or chronic medical problems, these are probably not your best option. ° °No Primary Care Doctor: °- Call [facility] at  832-8000 - they can help you locate a primary care doctor that  accepts your insurance, provides certain services, etc. °- Physician Referral Service- 1-800-533-3463 ° °Chronic Pain Problems: °Organization         Address  Phone   Notes  °[facility]  (336) 297-2271 Patients need to be referred by their primary care doctor.  ° °Medication  Assistance: °Organization         Address  Phone   Notes  °[place] [facility] [address] °[place], [place] [address] (336) 641-8030 --Must be a resident of [place] °-- Must have NO insurance coverage whatsoever (no Medicaid/ Medicare, etc.) °-- The pt. MUST have a primary care doctor that directs their care regularly and follows them in the community °  °[facility]  (866) 331-1348   °[employer]  (888) 892-1162   ° °Agencies that provide inexpensive medical care: °Organization         Address  Phone   Notes  °[facility]  (336) 832-8035   °[facility]    (336) 832-7272   °[facility] [address] °[place], [place] [address] (336) 832-4777   °[facility] [address], °[place] (336) 271-4999   °[facility]    (336) 373-0678   °[facility]    (336) 272-1050   °[facility] ° [address], [place] Phone:  (336) 832-4444, Fax:  (336) 832-4440 Hours of Operation:  9 am - 6 pm, M-F.  Also accepts Medicaid/Medicare and self-pay.  °[facility] ° [address], [place] Phone: (336) [phone], Fax: (336) [phone]. Hours of Operation:  8:30 am - 5:30 pm, M-F.  Also accepts Medicaid and self-pay.  °[facility] [place] [address], [place] Phone: (336) 878-6027   °[facility] [address], [place], [place] (336)723-1848, Ext. 123 Mondays & Thursdays: 7-9 AM.  First 15 patients are seen on a first come, first serve basis. °  ° °Medicaid-accepting [place] Providers: ° °Organization         Address  Phone   Notes  °[facility] [address], [place] (336) 641-2100 Also accepts self-pay patients.  °[facility] [address], [place] ° (336) 856-9996   °[facility] [address], [place]  (336) 288-8857   °[facility] [address] [place] [address], [place] (336) 299-7000   °Veita Bland [address], [place]  ° (336) 373-1557 Only accepts [place] Access Medicaid patients after they have their name applied to their card.  ° °Self-Pay (no insurance) in [place]: ° °Organization         Address  Phone   Notes  °Sickle Cell Patients, [facility] [address], [place] (336) 832-1970   °[facility] [address], [place] (336) 832-4400   °[facility] ° [address], [place] (336) 992-4800   °[facility]/Dr. Osei-Bonsu ° [address] [place] [address], [place] or [address], [place] (336) [phone] Phone number for both [place] and [place] locations is the same.  °[facility] [address], [place] (336) 299-0000   °[facility] [address] [place] [address], [place] or [address] (336) 852-7530 °(336) 878-2260   °[facility] [address], [place] (336) 350-1642, phone; (336) 294-5005, fax Sees patients 1st and 3rd Saturday of every month.  Must not qualify for public or private insurance (i.e. Medicaid, Medicare, [place] Health Choice, Veterans' Benefits) • Household income should be no more than 200% of the poverty level •The clinic cannot treat you if you are pregnant or think you are pregnant • Sexually transmitted diseases are not treated at the clinic.  ° ° °Dental Care: °Organization         Address  Phone  Notes  °[place] [facility] [address], [place] (336) 641-6152 Accepts children up to age 21 who are enrolled in Medicaid or [place] Health Choice; pregnant women with a Medicaid card; and children who have applied for Medicaid or [place] Health Choice, but were declined, whose parents can pay a reduced fee at time of service.  °[place]  [facility] [place]  [address], [place] (336) 641-7733 Accepts children up to age 21 who are enrolled in Medicaid or [place] Health Choice; pregnant women with a Medicaid card; and children who have applied for Medicaid or [place] Health Choice, but were declined, whose parents can pay a reduced fee at time of service.  °[facility] ° [address],  (336) 641-4533 Patients are seen by appointment only. Walk-ins are not accepted. [facility] will see patients 18 years of age and older. °Monday - Tuesday (8am-5pm) °Most Wednesdays (8:30-5pm) °$30 per visit, cash only  °[facility] ° [address], [place] (336) 641-4533 Patients are seen by appointment only. Walk-ins are not accepted. [facility] will see patients 18 years of age and older. °One   Wednesday Evening (Monthly: Volunteer Based).  $30 per visit, cash only  °[facility]  (919) [phone] for adults; Children under age 4, call Graduate Pediatric Dentistry at ([phone]) [phone]. Children aged 4-14, please call ([phone]) [phone] to request a pediatric application. ° Dental services are provided in all areas of dental care including fillings, crowns and bridges, complete and partial dentures, implants, gum treatment, root canals, and extractions. Preventive care is also provided. Treatment is provided to both adults and children. °Patients are selected via a lottery and there is often a waiting list. °  °[facility] [address], °[place] ° (336) 763-8833 [email or web address] °  °[facility] [address], [place], [place] (336)723-1848, Ext. 123 Second and Fourth Thursday of each month, opens at 6:30 AM; Clinic ends at 9 AM.  Patients are seen on a first-come first-served basis, and a limited number are seen during each clinic.  ° °[facility] ° [address], [place], [place] (336) [phone]    Eligibility Requirements °You must have lived in [place], [place], or [place] counties for at least the last three months. °  You cannot be eligible for state or federal sponsored healthcare insurance, including Veterans Administration, Medicaid, or Medicare. °  You generally cannot be eligible for healthcare insurance through your employer.  °  How to apply: °Eligibility screenings are held every Tuesday and Wednesday afternoon from 1:00 pm until 4:00 pm. You do not need an appointment for the interview!  °[facility] [address] [place] [address], [place], [place] 336-631-2330   °[facility]  336-342-8273   °[facility]  336-703-3100   °[facility]  336-570-6415   ° °Behavioral Health Resources in the Community: °Intensive Outpatient Programs °Organization         Address  Phone  Notes  °[facility] [address], [place], [place] 336-878-6098   °[facility] [address], [place], [place] 336-832-9800   °[facility] [address], [place], [place] ° 336-882-2125   °[facility] [address],  °[place], [place] 1-800-853-5163 or 336-641-4981   °Substance Abuse Resources °Organization         Address  Phone  Notes  °[facility]  336-882-2125   °[facility]  336-784-9470   °[facility]  336-285-9073   °[facility]  336-845-3988   °Residential & Outpatient Substance Abuse Program  1-800-659-3381   °Psychological Services °Organization         Address  Phone  Notes  °[facility]  336- 832-9600   °[facility]  336- 378-7881   °[facility] [address], [place] 1-800-853-5163 or 336-641-4981   ° °Mobile Crisis Teams °Organization         Address  Phone  Notes  °[facility], Mobile Crisis Care Unit  1-877-626-1772   °Assertive °[facility] ° [address]  , [place] 336-834-9664   °Sharon DeEsch [address] ° [place] 336-554-5454   ° °Self-Help/Support Groups °Organization         Address  Phone             Notes  °[facility]  - variety of support groups  336- 373-1402 Call for more information  °Narcotics Anonymous (NA), [facility] [address], °[place]  2 meetings at this location  ° °  Residential Treatment Programs Organization         Address  Phone  Notes  [facility] [address]    [place]  1-587-490-3591   [facility]  [address], [place] [address], [place], [place]   [place], [place] [phone] Admissions: 8am-3pm M-F  [facility] [place] [address],    [place], [place] 338-329-1916   [facility] [address] [place], [place], [place]   [facility] [address],  [place], [place]   [facility] Intensive Outpatient [address], [place], [place]   [facility] [address]  [place], [place] 1-(606) 078-9974 or 380-534-4048   [facility] [address], [place], [place] Accepts Medicaid  [facility] [address],  [place] 1-213-250-8563 Substance Abuse/Addiction Treatment   [facility]         Address  Phone  Notes  [facility]  (304)120-1856   Domenic Schwab, PhD [address] [place], [place]   587-371-1370 or (801) 069-5697   [place], [place] (520)685-1729   [facility] [address] [place], [place] (616) [phone] Insurance/Medicaid/sponsorship through [facility] and Families [address]                                    [place], [place] (715)[phone] [place] [address], [place] (575)[phone]    Dr. Adele Schilder  [phone]   [facility] Dept. 1) [address], [place] 2) [place] 3)  [address], [place] [phone]  [phone]   [place] [phone] or [phone] (After Hours)      Take your usual prescriptions as previously directed.  Call your regular medical doctor and your Cardiologist on Monday to schedule a follow up appointment this week.  Return to the Emergency Department immediately sooner if worsening.

## 2015-10-11 NOTE — ED Notes (Signed)
Pt states recent change in medications. States she noticed weakness and sob last week after beginning Maxide. Pt was told to stop taking Maxide yesterday. Then prescribed Hydralazine yesterday and sob and weakness returned. Was then told by the office when she called to come to the ED. No distress noted.

## 2015-10-11 NOTE — Telephone Encounter (Signed)
Patient spoke with [name] on yesterday. Medication was changed. States that she is still having the same symptoms. / tg

## 2015-10-11 NOTE — ED Provider Notes (Signed)
CSN: [id number]     Arrival date & time 10/11/15  1527 History   First MD Initiated Contact with Patient 10/11/15 1600     Chief Complaint  Patient presents with  . Shortness of Breath  . Fatigue      HPI Pt was seen at 1615. Per pt, c/o gradual onset and worsening of persistent generalized fatigue and SOB for the past 1 to 2 months, worse over the past 1 week. Pt was evaluated by her Cards MD for same 1 month ago, and had her BP meds adjusted. Pt states she has had her BP meds adjusted x2 without change in her symptoms. States her symptoms worsen on exertion. Denies CP/palpitations, no SOB, no abd pain, no N/V/D, no fevers, no focal motor weakness, no tingling/numbness in extremities. The symptoms have been associated with no other complaints. The patient has a significant history of similar symptoms previously, recently being evaluated for this complaint and multiple prior evals for same.      Past Medical History  Diagnosis Date  . Hypertension   . Cancer [facility])     breast cancer  . Fatigue    Past Surgical History  Procedure Laterality Date  . Breast lumpectomy      right side  . Tonsillectomy    . Dilation and curettage of uterus    . Foot surgery     Family History  Problem Relation Age of Onset  . Heart attack Mother   . Cancer Mother    Social History  Substance Use Topics  . Smoking status: Never Smoker   . Smokeless tobacco: Never Used  . Alcohol Use: No   OB History    Gravida Para Term Preterm AB TAB SAB Ectopic Multiple Living   2 2 2       2      Review of Systems ROS: Statement: All systems negative except as marked or noted in the HPI; Constitutional: Negative for fever and chills. +generalized weakness/fatigue ; ; Eyes: Negative for eye pain, redness and discharge. ; ; ENMT: Negative for ear pain, hoarseness, nasal congestion, sinus pressure and sore throat. ; ; Cardiovascular: Negative for chest pain, palpitations, diaphoresis, and peripheral edema. ; ;  Respiratory: +SOB. Negative for cough, wheezing and stridor. ; ; Gastrointestinal: Negative for nausea, vomiting, diarrhea, abdominal pain, blood in stool, hematemesis, jaundice and rectal bleeding. . ; ; Genitourinary: Negative for dysuria, flank pain and hematuria. ; ; Musculoskeletal: Negative for back pain and neck pain. Negative for swelling and trauma.; ; Skin: Negative for pruritus, rash, abrasions, blisters, bruising and skin lesion.; ; Neuro: Negative for headache, lightheadedness and neck stiffness. Negative for altered level of consciousness , altered mental status, extremity weakness, paresthesias, involuntary movement, seizure and syncope.      Allergies  Latex; Motrin; and Bextra  Home Medications   Prior to Admission medications   Medication Sig Start Date End Date Taking? Authorizing Provider  acetaminophen (TYLENOL) 500 MG tablet Take 1,000 mg by mouth daily as needed for mild pain.    Historical Provider, MD  aspirin EC 81 MG tablet Take 81 mg by mouth daily.    Historical Provider, MD  Calcium Carbonate-Vitamin D (CALCIUM 600+D3 PO) Take 1 tablet by mouth 2 (two) times daily.    Historical Provider, MD  carbamide peroxide (DEBROX) 6.5 % otic solution Place 1 drop into both ears daily as needed (ear wax buildup).    Historical Provider, MD  cycloSPORINE (RESTASIS) 0.05 % ophthalmic emulsion Place  1 drop into both eyes 2 (two) times daily.    Historical Provider, MD  felodipine (PLENDIL) 5 MG 24 hr tablet Take 10 mg by mouth daily.     Historical Provider, MD  fluticasone (FLONASE) 50 MCG/ACT nasal spray Place 2 sprays into the nose daily as needed for allergies.    Historical Provider, MD  hydrALAZINE (APRESOLINE) 25 MG tablet Take 1 tablet (25 mg total) by mouth 2 (two) times daily. 10/10/15   Herminio Commons, MD  hydrOXYzine (ATARAX/VISTARIL) 25 MG tablet Take one pill at bedtime to help with sleep 08/30/15   Milton Ferguson, MD  [facility] PAIN RELIEF PATCH) PADS  Apply 1 each topically daily as needed (pain).    Historical Provider, MD  losartan (COZAAR) 100 MG tablet Take 100 mg by mouth daily.    Historical Provider, MD  Multiple Vitamins-Minerals (CENTRUM ADULTS PO) Take 1 tablet by mouth daily.    Historical Provider, MD  Omega-3 Fatty Acids (FISH OIL TRIPLE STRENGTH) 1400 MG CAPS Take 1 tablet by mouth daily.    Historical Provider, MD  Polyethyl Glycol-Propyl Glycol (SYSTANE OP) Apply 1 drop to eye 4 (four) times daily.    Historical Provider, MD  tretinoin (RETIN-A) 0.025 % cream Apply 1 application topically at bedtime.  06/27/15   Historical Provider, MD   BP 177/66 mmHg  Pulse 77  Temp(Src) 97.5 F (36.4 C) (Oral)  Resp 18  Ht 5\' 2"  (1.575 m)  Wt 132 lb (59.875 kg)  BMI 24.14 kg/m2  SpO2 100%   16:34:03 Orthostatic Vital Signs SW  Orthostatic Lying  - BP- Lying: 160/64 mmHg ; Pulse- Lying: 62  Orthostatic Sitting - BP- Sitting: 155/65 mmHg ; Pulse- Sitting: 66  Orthostatic Standing at 0 minutes - BP- Standing at 0 minutes: 137/73 mmHg ; Pulse- Standing at 0 minutes: 74     17:15 Orthostatic Vital Signs DM  Orthostatic Lying  - BP- Lying: 184/71 mmHg ; Pulse- Lying: 64  Orthostatic Sitting - BP- Sitting: 180/67 mmHg ; Pulse- Sitting: 66  Orthostatic Standing at 0 minutes - BP- Standing at 0 minutes: 170/74 mmHg ; Pulse- Standing at 0 minutes: 80      Physical Exam  1615: Physical examination:  Nursing notes reviewed; Vital signs and O2 SAT reviewed;  Constitutional: Well developed, Well nourished, Well hydrated, In no acute distress; Head:  Normocephalic, atraumatic; Eyes: EOMI, PERRL, No scleral icterus; ENMT: Mouth and pharynx normal, Mucous membranes moist; Neck: Supple, Full range of motion, No lymphadenopathy; Cardiovascular: Regular rate and rhythm, No gallop; Respiratory: Breath sounds clear & equal bilaterally, No wheezes.  Speaking full sentences with ease, Normal respiratory effort/excursion; Chest: Nontender, Movement normal;  Abdomen: Soft, Nontender, Nondistended, Normal bowel sounds; Genitourinary: No CVA tenderness; Extremities: Pulses normal, No tenderness, No edema, No calf edema or asymmetry.; Neuro: AA&Ox3, Major CN grossly intact. No facial droop. Speech clear. No gross focal motor or sensory deficits in extremities.; Skin: Color normal, Warm, Dry.; Psych:  Affect flat.     ED Course  Procedures (including critical care time) Labs Review   Imaging Review  I have personally reviewed and evaluated these images and lab results as part of my medical decision-making.   EKG Interpretation   Date/Time:  Friday October 11 2015 16:31:39 EDT Ventricular Rate:  63 PR Interval:  156 QRS Duration: 101 QT Interval:  412 QTC Calculation: 422 R Axis:   -15 Text Interpretation:  Sinus rhythm Borderline left axis deviation Baseline  wander When compared  with ECG of 07/21/2015 Premature ventricular complexes  are no longer Present Confirmed by [facility]  MD, Nunzio Cory [phone]) on  10/11/2015 5:19:25 PM      MDM  MDM Reviewed: previous chart, nursing note and vitals Reviewed previous: labs and ECG Interpretation: labs, ECG and x-ray      Results for orders placed or performed during the hospital encounter of 10/11/15  Lactic acid, plasma  Result Value Ref Range   Lactic Acid, Venous 0.7 0.5 - 2.0 mmol/L  Troponin I  Result Value Ref Range   Troponin I <0.03 <0.031 ng/mL  Brain natriuretic peptide  Result Value Ref Range   B Natriuretic Peptide 17.0 0.0 - 100.0 pg/mL  Basic metabolic panel  Result Value Ref Range   Sodium 130 (L) 135 - 145 mmol/L   Potassium 3.9 3.5 - 5.1 mmol/L   Chloride 92 (L) 101 - 111 mmol/L   CO2 30 22 - 32 mmol/L   Glucose, Bld 121 (H) 65 - 99 mg/dL   BUN 15 6 - 20 mg/dL   Creatinine, Ser 0.94 0.44 - 1.00 mg/dL   Calcium 9.5 8.9 - 10.3 mg/dL   GFR calc non Af Amer 54 (L) >60 mL/min   GFR calc Af Amer >60 >60 mL/min   Anion gap 8 5 - 15  CBC with Differential  Result  Value Ref Range   WBC 5.1 4.0 - 10.5 K/uL   RBC 4.31 3.87 - 5.11 MIL/uL   Hemoglobin 12.6 12.0 - 15.0 g/dL   HCT 37.4 36.0 - 46.0 %   MCV 86.8 78.0 - 100.0 fL   MCH 29.2 26.0 - 34.0 pg   MCHC 33.7 30.0 - 36.0 g/dL   RDW 14.0 11.5 - 15.5 %   Platelets 293 150 - 400 K/uL   Neutrophils Relative % 60 %   Neutro Abs 3.0 1.7 - 7.7 K/uL   Lymphocytes Relative 32 %   Lymphs Abs 1.6 0.7 - 4.0 K/uL   Monocytes Relative 8 %   Monocytes Absolute 0.4 0.1 - 1.0 K/uL   Eosinophils Relative 0 %   Eosinophils Absolute 0.0 0.0 - 0.7 K/uL   Basophils Relative 0 %   Basophils Absolute 0.0 0.0 - 0.1 K/uL  Urinalysis, Routine w reflex microscopic  Result Value Ref Range   Color, Urine YELLOW YELLOW   APPearance CLEAR CLEAR   Specific Gravity, Urine 1.010 1.005 - 1.030   pH 7.0 5.0 - 8.0   Glucose, UA NEGATIVE NEGATIVE mg/dL   Hgb urine dipstick NEGATIVE NEGATIVE   Bilirubin Urine NEGATIVE NEGATIVE   Ketones, ur NEGATIVE NEGATIVE mg/dL   Protein, ur NEGATIVE NEGATIVE mg/dL   Urobilinogen, UA 0.2 0.0 - 1.0 mg/dL   Nitrite NEGATIVE NEGATIVE   Leukocytes, UA SMALL (A) NEGATIVE  Urine microscopic-add on  Result Value Ref Range   Squamous Epithelial / LPF FEW (A) RARE   WBC, UA 3-6 <3 WBC/hpf   Bacteria, UA FEW (A) RARE   Dg Chest 2 View 10/11/2015  CLINICAL DATA:  Shortness of breath, weakness EXAM: CHEST  2 VIEW COMPARISON:  08/30/2015 FINDINGS: Lungs are clear.  No pleural effusion or pneumothorax. The heart is top normal in size. Degenerative changes of the visualized thoracolumbar spine. IMPRESSION: No evidence of acute cardiopulmonary disease. Electronically Signed   By: Julian Hy M.D.   On: 10/11/2015 17:13    1900:  Na per baseline. Pt has tol PO well without N/V. Pt is not orthostatic on VS. Pt has ambulated  with steady gait, easy resps, Sats 99% R/A, NAD. No clear indication for admission at this time. Pt would like to go home now. Dx and testing d/w pt and family.  Questions  answered.  Verb understanding, agreeable to d/c home with outpt f/u.   Francine Graven, DO 10/15/15 [phone]

## 2015-10-11 NOTE — ED Notes (Signed)
Patient given a Ginger Ale at this time. 

## 2015-10-11 NOTE — Telephone Encounter (Signed)
Patient called today to say despite the change in her medication (maxzide) and starting hydralazine, she feels SOB and profoundly weak for past two days.Reports BP of 130/71,129/71, HR 68.her weight is 132 lbs, down 2 lbs since visit in September.No syncope but feels she needs to go to the ED.States she is already in route to [facility] ED.I will FYI: Dr Harl Bowie, she has an apt with him next week

## 2015-10-13 LAB — URINE CULTURE

## 2015-10-14 ENCOUNTER — Emergency Department (HOSPITAL_COMMUNITY)
Admission: EM | Admit: 2015-10-14 | Discharge: 2015-10-14 | Disposition: A | Discharge status: Home / Self Care | Payer: Medicare Other | Attending: Emergency Medicine | Admitting: Emergency Medicine

## 2015-10-14 ENCOUNTER — Encounter (HOSPITAL_COMMUNITY): Payer: Self-pay | Admitting: Cardiology

## 2015-10-14 ENCOUNTER — Emergency Department (HOSPITAL_COMMUNITY): Payer: Medicare Other

## 2015-10-14 DIAGNOSIS — R0989 Other specified symptoms and signs involving the circulatory and respiratory systems: Secondary | ICD-10-CM | POA: Diagnosis not present

## 2015-10-14 DIAGNOSIS — Z853 Personal history of malignant neoplasm of breast: Secondary | ICD-10-CM | POA: Insufficient documentation

## 2015-10-14 DIAGNOSIS — R0602 Shortness of breath: Secondary | ICD-10-CM | POA: Diagnosis not present

## 2015-10-14 DIAGNOSIS — R5383 Other fatigue: Secondary | ICD-10-CM | POA: Insufficient documentation

## 2015-10-14 DIAGNOSIS — I10 Essential (primary) hypertension: Secondary | ICD-10-CM | POA: Insufficient documentation

## 2015-10-14 DIAGNOSIS — Z91040 Latex allergy status: Secondary | ICD-10-CM | POA: Insufficient documentation

## 2015-10-14 DIAGNOSIS — R630 Anorexia: Secondary | ICD-10-CM | POA: Diagnosis not present

## 2015-10-14 DIAGNOSIS — Z7982 Long term (current) use of aspirin: Secondary | ICD-10-CM | POA: Insufficient documentation

## 2015-10-14 DIAGNOSIS — R634 Abnormal weight loss: Secondary | ICD-10-CM | POA: Insufficient documentation

## 2015-10-14 DIAGNOSIS — R404 Transient alteration of awareness: Secondary | ICD-10-CM | POA: Diagnosis not present

## 2015-10-14 DIAGNOSIS — R42 Dizziness and giddiness: Secondary | ICD-10-CM | POA: Diagnosis not present

## 2015-10-14 DIAGNOSIS — Z79899 Other long term (current) drug therapy: Secondary | ICD-10-CM | POA: Insufficient documentation

## 2015-10-14 DIAGNOSIS — R531 Weakness: Secondary | ICD-10-CM | POA: Diagnosis not present

## 2015-10-14 LAB — BASIC METABOLIC PANEL
Anion gap: 8 (ref 5–15)
BUN: 10 mg/dL (ref 6–20)
CALCIUM: 9.2 mg/dL (ref 8.9–10.3)
CO2: 28 mmol/L (ref 22–32)
CREATININE: 0.86 mg/dL (ref 0.44–1.00)
Chloride: 97 mmol/L — ABNORMAL LOW (ref 101–111)
GFR calc non Af Amer: 60 mL/min — ABNORMAL LOW (ref >60)
Glucose, Bld: 113 mg/dL — ABNORMAL HIGH (ref 65–99)
Potassium: 3.4 mmol/L — ABNORMAL LOW (ref 3.5–5.1)
SODIUM: 133 mmol/L — AB (ref 135–145)

## 2015-10-14 LAB — URINALYSIS, ROUTINE W REFLEX MICROSCOPIC
Bilirubin Urine: NEGATIVE
GLUCOSE, UA: NEGATIVE mg/dL
HGB URINE DIPSTICK: NEGATIVE
Ketones, ur: NEGATIVE mg/dL
Leukocytes, UA: NEGATIVE
Nitrite: NEGATIVE
PH: 8 (ref 5.0–8.0)
PROTEIN: NEGATIVE mg/dL
Specific Gravity, Urine: 1.015 (ref 1.005–1.030)
Urobilinogen, UA: 0.2 mg/dL (ref 0.0–1.0)

## 2015-10-14 LAB — CBC WITH DIFFERENTIAL/PLATELET
Basophils Absolute: 0 10*3/uL (ref 0.0–0.1)
Basophils Relative: 1 %
EOS ABS: 0 10*3/uL (ref 0.0–0.7)
EOS PCT: 1 %
HEMATOCRIT: 37.4 % (ref 36.0–46.0)
Hemoglobin: 12.5 g/dL (ref 12.0–15.0)
LYMPHS ABS: 1.4 10*3/uL (ref 0.7–4.0)
LYMPHS PCT: 43 %
MCH: 29.2 pg (ref 26.0–34.0)
MCHC: 33.4 g/dL (ref 30.0–36.0)
MCV: 87.4 fL (ref 78.0–100.0)
MONO ABS: 0.3 10*3/uL (ref 0.1–1.0)
Monocytes Relative: 8 %
Neutro Abs: 1.6 10*3/uL — ABNORMAL LOW (ref 1.7–7.7)
Neutrophils Relative %: 48 %
Platelets: 321 10*3/uL (ref 150–400)
RBC: 4.28 MIL/uL (ref 3.87–5.11)
RDW: 14.1 % (ref 11.5–15.5)
WBC: 3.3 10*3/uL — AB (ref 4.0–10.5)

## 2015-10-14 LAB — TROPONIN I: Troponin I: <0.03 ng/mL (ref <0.031)

## 2015-10-14 MED ORDER — SODIUM CHLORIDE 0.9 % IV BOLUS (SEPSIS)
500.0000 mL | Freq: Once | INTRAVENOUS | Status: AC
  Administered 2015-10-14: 500 mL via INTRAVENOUS

## 2015-10-14 NOTE — ED Notes (Signed)
EMS called out for sob and weakness.  Seen here with same Friday.   B/p 180/80.  CBG 117

## 2015-10-14 NOTE — ED Provider Notes (Signed)
CSN: [id number]     Arrival date & time 10/14/15  7741 History  By signing my name below, I, Terressa Koyanagi, attest that this documentation has been prepared under the direction and in the presence of Elnora Morrison, MD. Electronically Signed: Terressa Koyanagi, ED Scribe. 10/14/2015. 8:43 AM.  Chief Complaint  Patient presents with  . Weakness  . Shortness of Breath   HPI PCP: FANTA,TESFAYE, MD HPI Comments: Charlotte [name] is a 81 y.o. female, with PMHx noted below, brought in by ambulance, who presents to the Emergency Department complaining of ongoing, worsening fatigue with associated SOB originally onset 08/2015 and worsening over the past couple of weeks. Pt reports multiple changes to her daily meds, including two recent changes to her BP meds, most recent change being taken off Maxide and started on Hydralazine. Pt notes she has an appointment with her cardiologist, Dr. Harl Bowie, tomorrow. Pt admits a 10 lbs weight loss recently due to decreased intake PO; however, denies any thyroid problems, blood in stool, abd pain, chest pain, weakness of one side of body, headaches, tingling, trouble swallowing, fever, chills, urinary Sx, Hx of pulmonary embolism, surgery in last three months, or any other Sx at this time. Pt has been evaluated for the same Sx multiple times with the last ED visit for the same on 10/11/15.    Past Medical History  Diagnosis Date  . Hypertension   . Cancer [facility])     breast cancer  . Fatigue    Past Surgical History  Procedure Laterality Date  . Breast lumpectomy      right side  . Tonsillectomy    . Dilation and curettage of uterus    . Foot surgery     Family History  Problem Relation Age of Onset  . Heart attack Mother   . Cancer Mother    Social History  Substance Use Topics  . Smoking status: Never Smoker   . Smokeless tobacco: Never Used  . Alcohol Use: No   OB History    Gravida Para Term Preterm AB TAB SAB Ectopic Multiple Living   2 2 2       2       Review of Systems  Constitutional: Positive for appetite change (decreased intake PO), fatigue and unexpected weight change. Negative for fever and chills.  HENT: Negative for trouble swallowing.   Respiratory: Positive for shortness of breath.   Gastrointestinal: Negative for abdominal pain and blood in stool.  Genitourinary: Negative for dysuria, urgency, frequency, decreased urine volume and difficulty urinating.  Neurological: Negative for weakness and headaches.  All other systems reviewed and are negative.  Allergies  Latex; Motrin; and Bextra  Home Medications   Prior to Admission medications   Medication Sig Start Date End Date Taking? Authorizing Provider  acetaminophen (TYLENOL) 500 MG tablet Take 1,000 mg by mouth daily as needed for mild pain.   Yes Historical Provider, MD  aspirin EC 81 MG tablet Take 81 mg by mouth daily.   Yes Historical Provider, MD  Calcium Carbonate-Vitamin D (CALCIUM 600+D3 PO) Take 1 tablet by mouth 2 (two) times daily.   Yes Historical Provider, MD  cycloSPORINE (RESTASIS) 0.05 % ophthalmic emulsion Place 1 drop into both eyes 2 (two) times daily.   Yes Historical Provider, MD  felodipine (PLENDIL) 5 MG 24 hr tablet Take 10 mg by mouth daily.    Yes Historical Provider, MD  fluticasone (FLONASE) 50 MCG/ACT nasal spray Place 2 sprays into the nose daily as  needed for allergies.   Yes Historical Provider, MD  hydrALAZINE (APRESOLINE) 25 MG tablet Take 1 tablet (25 mg total) by mouth 2 (two) times daily. 10/10/15  Yes Herminio Commons, MD  hydrOXYzine (ATARAX/VISTARIL) 25 MG tablet Take one pill at bedtime to help with sleep Patient taking differently: Take 25 mg by mouth at bedtime as needed (for sleep).  08/30/15  Yes Milton Ferguson, MD  Liniments [facility] PAIN RELIEF PATCH) PADS Apply 1 each topically daily as needed (pain).   Yes Historical Provider, MD  losartan (COZAAR) 100 MG tablet Take 100 mg by mouth daily.   Yes Historical Provider, MD   Multiple Vitamins-Minerals (CENTRUM ADULTS PO) Take 1 tablet by mouth daily.   Yes Historical Provider, MD  Omega-3 Fatty Acids (FISH OIL TRIPLE STRENGTH) 1400 MG CAPS Take 1 tablet by mouth daily.   Yes Historical Provider, MD  Polyethyl Glycol-Propyl Glycol (SYSTANE OP) Apply 1 drop to eye 4 (four) times daily.   Yes Historical Provider, MD  tretinoin (RETIN-A) 0.025 % cream Apply 1 application topically at bedtime.  06/27/15  Yes Historical Provider, MD  carbamide peroxide (DEBROX) 6.5 % otic solution Place 1 drop into both ears daily as needed (ear wax buildup).    Historical Provider, MD   Triage Vitals: BP 189/79 mmHg  Pulse 62  Temp(Src) 98 F (36.7 C) (Oral)  Resp 18  Ht 5\' 3"  (1.6 m)  Wt 132 lb (59.875 kg)  BMI 23.39 kg/m2  SpO2 97% Physical Exam  Constitutional: She is oriented to person, place, and time. She appears well-developed and well-nourished.  HENT:  Head: Normocephalic.  Eyes: EOM are normal. Pupils are equal, round, and reactive to light.  Neck: Normal range of motion.  Cardiovascular: Normal rate and regular rhythm.   Pulmonary/Chest: Effort normal. No respiratory distress.  Few crackles at right base.   Abdominal: Soft. She exhibits no distension. There is no tenderness.  Musculoskeletal: Normal range of motion. She exhibits no edema.  Neurological: She is alert and oriented to person, place, and time. No cranial nerve deficit.  CN 2-12 grossly intact. 5/5 strength in all 4 extremities. Grossly normal sensation.  Finger nose intact, no obvious arm drift.   Psychiatric: She has a normal mood and affect.  Nursing note and vitals reviewed.   ED Course  Procedures (including critical care time) DIAGNOSTIC STUDIES: Oxygen Saturation is 97% on RA, nl by my interpretation.    COORDINATION OF CARE: 8:24 AM: Discussed treatment plan which includes UA, labs, IV fluids, and melatonin with pt at bedside; patient verbalizes understanding and agrees with treatment  plan. 11:00 AM: consult with Cardiology will ensure close f/u and stress test for chronic fatigue.  11:05 AM: Recheck with pt, discussed cardiology consult with pt.   Labs Review Labs Reviewed  CBC WITH DIFFERENTIAL/PLATELET - Abnormal; Notable for the following:    WBC 3.3 (*)    Neutro Abs 1.6 (*)    All other components within normal limits  BASIC METABOLIC PANEL - Abnormal; Notable for the following:    Sodium 133 (*)    Potassium 3.4 (*)    Chloride 97 (*)    Glucose, Bld 113 (*)    GFR calc non Af Amer 60 (*)    All other components within normal limits  TROPONIN I  URINALYSIS, ROUTINE W REFLEX MICROSCOPIC (NOT AT [facility])    I have personally reviewed and evaluated these lab results as part of my medical decision-making.   EKG Interpretation  None     EKG reviewed heart rate 58, LVH, no acute ST elevation, normal QT, sinus. MDM   Final diagnoses:  Other fatigue  Essential hypertension   I personally performed the services described in this documentation, which was scribed in my presence. The recorded information has been reviewed and is accurate.  Patient presents with recurrent fatigue, no current shortness of breath or chest pain. Blood pressure elevated however patient is a history of high blood pressure did not take medicines today. Multiple recent medicine changes with possible side effects as cause of fatigue. No known cancer however patient has had weight loss recently. With recurrent visit and intermittent symptoms not improving discuss case with cardiologist on call who will ensure very close follow-up in the clinic tomorrow and possible stress test and further evaluation. Discussed this with the family. No indication for admission at this time. Recent TSH nl reviewed.  Results and differential diagnosis were discussed with the patient/parent/guardian. Xrays were independently reviewed by myself.  Close follow up outpatient was discussed, comfortable with the  plan.   Medications  sodium chloride 0.9 % bolus 500 mL (0 mLs Intravenous Stopped 10/14/15 0935)    Filed Vitals:   10/14/15 1000 10/14/15 1030 10/14/15 1100 10/14/15 1214  BP: 200/72 182/77 195/68 184/68  Pulse: 62 56 62 60  Temp:    97.8 F (36.6 C)  TempSrc:    Oral  Resp: 15 19 17 13   Height:      Weight:      SpO2: 99% 98% 99% 98%    Final diagnoses:  Other fatigue  Essential hypertension      Elnora Morrison, MD 10/14/15 1216

## 2015-10-14 NOTE — Discharge Instructions (Signed)
If you were given medicines take as directed.  If you are on coumadin or contraceptives realize their levels and effectiveness is altered by many different medicines.  If you have any reaction (rash, tongues swelling, other) to the medicines stop taking and see a physician.    If your blood pressure was elevated in the ER make sure you follow up for management with a primary doctor or return for chest pain, shortness of breath or stroke symptoms.  Please follow up as directed and return to the ER or see a physician for new or worsening symptoms.  Thank you. Filed Vitals:   10/14/15 0930 10/14/15 1000 10/14/15 1030 10/14/15 1100  BP: 197/66 200/72 182/77 195/68  Pulse: 58 62 56 62  Temp:      TempSrc:      Resp: 12 15 19 17   Height:      Weight:      SpO2: 100% 99% 98% 99%

## 2015-10-14 NOTE — ED Notes (Signed)
Discharge instructions reviewed with pt , also reviewed lab work with her per her request - Verbalized has fu appointment with cardiologist tomorrow. Wheeled off unit by family

## 2015-10-15 ENCOUNTER — Encounter: Payer: Self-pay | Admitting: Cardiology

## 2015-10-15 ENCOUNTER — Ambulatory Visit (INDEPENDENT_AMBULATORY_CARE_PROVIDER_SITE_OTHER): Payer: Medicare Other | Admitting: Cardiology

## 2015-10-15 VITALS — BP 158/76 | HR 86 | Ht 63.0 in | Wt 128.6 lb

## 2015-10-15 DIAGNOSIS — R5382 Chronic fatigue, unspecified: Secondary | ICD-10-CM | POA: Diagnosis not present

## 2015-10-15 DIAGNOSIS — I10 Essential (primary) hypertension: Secondary | ICD-10-CM

## 2015-10-15 MED ORDER — HYDRALAZINE HCL 50 MG PO TABS: 50.0000 mg | ORAL_TABLET | Freq: Two times a day (BID) | ORAL | Status: DC

## 2015-10-15 NOTE — Patient Instructions (Signed)
Your physician recommends that you schedule a follow-up appointment in: 3 months with Dr Harl Bowie    INCREASE Hydralazine to 50 mg twice a day    Keep BP log for 1 week and drop off at office for review    If you need a refill on your cardiac medications before your next appointment, please call your pharmacy.     Thank you for choosing [facility] !

## 2015-10-15 NOTE — Progress Notes (Signed)
Patient ID: Charlotte [name], female   DOB: 04-Jun-1928, 81 y.o.   MRN: [id number]     Clinical Summary Charlotte [name] is a 81 y.o.female seen today for follow up of the following medical problems.   1. Generalized fatigue - seen in Er 08/30/15 with fatigue and SOB - 07/2015 echo LVEF 55-60%, no WMAs, grade I diastolic dysfunction, mild MR, PASP 49. Normal RV size and function.  - 08/2015 CXR no acute process. EKG SR with LVH.  - 08/2015 normal TSH, Hgb, trop neg x1.  - seen in ER again 10/11/15 with similar symptoms, negative workup.  - reports generalized fatigue started a few months ago. No chest pain. No LE edema. No significant SOB or DOE. No palpitations. Sleeps 5-6 hours. Walks up multiple times due to nocturia.   2. HTN - difficult to manage due to reported medication side effects, though it appears that many of the changes may have been related to her chronic fatigue that did not improve with med change.  - Clonidine caused fatigue and was stopped. Felodopine 10mg  caused weakness, changed to 5mg  daily which she is tolerating . Maxide stopped due to fatigue as well as drop in Na and K - recently started on hydralazine 25mg  bid, tolerating well.  - checks bp at home, typically 160s/70s but can he higher.    Past Medical History  Diagnosis Date  . Hypertension   . Cancer [facility])     breast cancer  . Fatigue      Allergies  Allergen Reactions  . Latex Swelling  . Motrin [Ibuprofen] Other (See Comments)    Stomach pain  . Bextra [Valdecoxib] Other (See Comments)    Stomach Pains     Current Outpatient Prescriptions  Medication Sig Dispense Refill  . acetaminophen (TYLENOL) 500 MG tablet Take 1,000 mg by mouth daily as needed for mild pain.    Marland Kitchen aspirin EC 81 MG tablet Take 81 mg by mouth daily.    . Calcium Carbonate-Vitamin D (CALCIUM 600+D3 PO) Take 1 tablet by mouth 2 (two) times daily.    . carbamide peroxide (DEBROX) 6.5 % otic solution Place 1 drop into both ears daily as  needed (ear wax buildup).    . cycloSPORINE (RESTASIS) 0.05 % ophthalmic emulsion Place 1 drop into both eyes 2 (two) times daily.    . felodipine (PLENDIL) 5 MG 24 hr tablet Take 10 mg by mouth daily.     . fluticasone (FLONASE) 50 MCG/ACT nasal spray Place 2 sprays into the nose daily as needed for allergies.    . hydrALAZINE (APRESOLINE) 25 MG tablet Take 1 tablet (25 mg total) by mouth 2 (two) times daily. 180 tablet 3  . hydrOXYzine (ATARAX/VISTARIL) 25 MG tablet Take one pill at bedtime to help with sleep (Patient taking differently: Take 25 mg by mouth at bedtime as needed (for sleep). ) 30 tablet 0  . Liniments (S[place]PAS PAIN RELIEF PATCH) PADS Apply 1 each topically daily as needed (pain).    Marland Kitchen losartan (COZAAR) 100 MG tablet Take 100 mg by mouth daily.    . Multiple Vitamins-Minerals (CENTRUM ADULTS PO) Take 1 tablet by mouth daily.    . Omega-3 Fatty Acids (FISH OIL TRIPLE STRENGTH) 1400 MG CAPS Take 1 tablet by mouth daily.    Vladimir Faster Glycol-Propyl Glycol (SYSTANE OP) Apply 1 drop to eye 4 (four) times daily.    Marland Kitchen tretinoin (RETIN-A) 0.025 % cream Apply 1 application topically at bedtime.   3  No current facility-administered medications for this visit.     Past Surgical History  Procedure Laterality Date  . Breast lumpectomy      right side  . Tonsillectomy    . Dilation and curettage of uterus    . Foot surgery       Allergies  Allergen Reactions  . Latex Swelling  . Motrin [Ibuprofen] Other (See Comments)    Stomach pain  . Bextra [Valdecoxib] Other (See Comments)    Stomach Pains      Family History  Problem Relation Age of Onset  . Heart attack Mother   . Cancer Mother      Social History Ms. Kindig reports that she has never smoked. She has never used smokeless tobacco. Ms. Adami reports that she does not drink alcohol.   Review of Systems CONSTITUTIONAL: + fatigue HEENT: Eyes: No visual loss, blurred vision, double vision or yellow  sclerae.No hearing loss, sneezing, congestion, runny nose or sore throat.  SKIN: No rash or itching.  CARDIOVASCULAR: no chest pain, no palpitations.  RESPIRATORY: No shortness of breath, cough or sputum.  GASTROINTESTINAL: No anorexia, nausea, vomiting or diarrhea. No abdominal pain or blood.  GENITOURINARY: No burning on urination, no polyuria NEUROLOGICAL: No headache, dizziness, syncope, paralysis, ataxia, numbness or tingling in the extremities. No change in bowel or bladder control.  MUSCULOSKELETAL: No muscle, back pain, joint pain or stiffness.  LYMPHATICS: No enlarged nodes. No history of splenectomy.  PSYCHIATRIC: No history of depression or anxiety.  ENDOCRINOLOGIC: No reports of sweating, cold or heat intolerance. No polyuria or polydipsia.  Marland Kitchen   Physical Examination Filed Vitals:   10/15/15 1056  BP: 158/76  Pulse: 86   Filed Vitals:   10/15/15 1056  Height: 5\' 3"  (1.6 m)  Weight: 128 lb 9.6 oz (58.333 kg)    Gen: resting comfortably, no acute distress HEENT: no scleral icterus, pupils equal round and reactive, no palptable cervical adenopathy,  CV: RRR, no m/r/g, no jvd Resp: Clear to auscultation bilaterally GI: abdomen is soft, non-tender, non-distended, normal bowel sounds, no hepatosplenomegaly MSK: extremities are warm, no edema.  Skin: warm, no rash Neuro:  no focal deficits Psych: appropriate affect   Diagnostic Studies  07/2015 echo Study Conclusions  - Left ventricle: The cavity size was normal. Wall thickness was normal. Systolic function was normal. The estimated ejection fraction was in the range of 55% to 60%. Wall motion was normal; there were no regional wall motion abnormalities. Doppler parameters are consistent with abnormal left ventricular relaxation (grade 1 diastolic dysfunction). There is evidence of elevated LA pressure. - Aortic valve: Mildly calcified annulus. Trileaflet; mildly thickened leaflets. Valve area (VTI):  1.78 cm^2. Valve area (Vmax): 1.91 cm^2. - Mitral valve: Mildly calcified annulus. Normal thickness leaflets . There was mild regurgitation. - Left atrium: The atrium was moderately dilated. - Atrial septum: No defect or patent foramen ovale was identified. - Pulmonary arteries: Systolic pressure was moderately increased. PA peak pressure: 49 mm Hg (S). - Technically adequate study.   Assessment and Plan   1. Generalized fatigue - unclear etiology. Symptoms are not cardiac in description as she has not chest pain, SOB, or DOE. Primarily just feels drained all the time. No indication for cardiac testing at this time.   2. HTN - uncontrolled, management has been difficult due to medication side effects, though many changes have been related to her chronic fatigue which has not improved.  - will increase hydral to 50mg  bid - she  will submit a bp log in 1 week   F/u 3 months     Arnoldo Lenis, M.D.

## 2015-10-16 ENCOUNTER — Ambulatory Visit (HOSPITAL_COMMUNITY): Payer: Medicare Other | Admitting: Physical Therapy

## 2015-10-30 ENCOUNTER — Encounter (HOSPITAL_COMMUNITY): Payer: Self-pay

## 2015-10-30 ENCOUNTER — Emergency Department (HOSPITAL_COMMUNITY): Payer: Medicare Other

## 2015-10-30 ENCOUNTER — Emergency Department (HOSPITAL_COMMUNITY)
Admission: EM | Admit: 2015-10-30 | Discharge: 2015-10-30 | Disposition: A | Discharge status: Home / Self Care | Payer: Medicare Other | Attending: Emergency Medicine | Admitting: Emergency Medicine

## 2015-10-30 DIAGNOSIS — Z79899 Other long term (current) drug therapy: Secondary | ICD-10-CM | POA: Insufficient documentation

## 2015-10-30 DIAGNOSIS — Z7982 Long term (current) use of aspirin: Secondary | ICD-10-CM | POA: Insufficient documentation

## 2015-10-30 DIAGNOSIS — R0682 Tachypnea, not elsewhere classified: Secondary | ICD-10-CM | POA: Diagnosis not present

## 2015-10-30 DIAGNOSIS — Z853 Personal history of malignant neoplasm of breast: Secondary | ICD-10-CM | POA: Insufficient documentation

## 2015-10-30 DIAGNOSIS — R0600 Dyspnea, unspecified: Secondary | ICD-10-CM

## 2015-10-30 DIAGNOSIS — F419 Anxiety disorder, unspecified: Secondary | ICD-10-CM | POA: Insufficient documentation

## 2015-10-30 DIAGNOSIS — Z91040 Latex allergy status: Secondary | ICD-10-CM | POA: Diagnosis not present

## 2015-10-30 DIAGNOSIS — R0602 Shortness of breath: Secondary | ICD-10-CM | POA: Diagnosis not present

## 2015-10-30 DIAGNOSIS — I10 Essential (primary) hypertension: Secondary | ICD-10-CM | POA: Diagnosis not present

## 2015-10-30 LAB — CBC WITH DIFFERENTIAL/PLATELET
BASOS PCT: 1 %
Basophils Absolute: 0 10*3/uL (ref 0.0–0.1)
EOS ABS: 0 10*3/uL (ref 0.0–0.7)
EOS PCT: 1 %
HCT: 37.3 % (ref 36.0–46.0)
Hemoglobin: 12.5 g/dL (ref 12.0–15.0)
LYMPHS ABS: 1 10*3/uL (ref 0.7–4.0)
Lymphocytes Relative: 25 %
MCH: 29.2 pg (ref 26.0–34.0)
MCHC: 33.5 g/dL (ref 30.0–36.0)
MCV: 87.1 fL (ref 78.0–100.0)
Monocytes Absolute: 0.4 10*3/uL (ref 0.1–1.0)
Monocytes Relative: 9 %
Neutro Abs: 2.7 10*3/uL (ref 1.7–7.7)
Neutrophils Relative %: 64 %
PLATELETS: 360 10*3/uL (ref 150–400)
RBC: 4.28 MIL/uL (ref 3.87–5.11)
RDW: 14 % (ref 11.5–15.5)
WBC: 4.1 10*3/uL (ref 4.0–10.5)

## 2015-10-30 LAB — BASIC METABOLIC PANEL
ANION GAP: 9 (ref 5–15)
BUN: 11 mg/dL (ref 6–20)
CALCIUM: 9.3 mg/dL (ref 8.9–10.3)
CO2: 28 mmol/L (ref 22–32)
Chloride: 96 mmol/L — ABNORMAL LOW (ref 101–111)
Creatinine, Ser: 0.8 mg/dL (ref 0.44–1.00)
GFR calc Af Amer: >60 mL/min (ref >60)
GLUCOSE: 122 mg/dL — AB (ref 65–99)
Potassium: 3.4 mmol/L — ABNORMAL LOW (ref 3.5–5.1)
SODIUM: 133 mmol/L — AB (ref 135–145)

## 2015-10-30 LAB — TROPONIN I

## 2015-10-30 MED ORDER — LORAZEPAM 0.5 MG PO TABS
0.5000 mg | ORAL_TABLET | Freq: Once | ORAL | Status: AC
  Administered 2015-10-30: 0.5 mg via ORAL
  Filled 2015-10-30: qty 1

## 2015-10-30 NOTE — ED Provider Notes (Signed)
CSN: [id number]     Arrival date & time 10/30/15  0827 History   By signing my name below, I, Tula [name], attest that this documentation has been prepared under the direction and in the presence of Nat Christen, MD.  Electronically Signed: Tula [name], ED Scribe. 10/30/2015. 8:57 AM.   Chief Complaint  Patient presents with  . Shortness of Breath   The history is provided by the patient. No language interpreter was used.    HPI Comments: Charlotte [name] is a 81 y.o. female [place], with a history of HTN, who presents to the Emergency Department complaining of constant, mild SOB that started last night. Pt states increased anxiety regarding the election results. She also reports starting on Ambien 2 days ago as a sleep aid. Pt has history of similar symptoms associated with change in HTN medication. She was last seen in the ED on 10/24 and 10/21 for similar symptoms after medication change. She had a chest x-ray on 10/21 which was negative. Pt lives with her husband and son. She denies CP and leg swelling.  Cardiology Branch PCP Legrand Rams  Past Medical History  Diagnosis Date  . Hypertension   . Cancer [facility])     breast cancer  . Fatigue    Past Surgical History  Procedure Laterality Date  . Breast lumpectomy      right side  . Tonsillectomy    . Dilation and curettage of uterus    . Foot surgery     Family History  Problem Relation Age of Onset  . Heart attack Mother   . Cancer Mother    Social History  Substance Use Topics  . Smoking status: Never Smoker   . Smokeless tobacco: Never Used  . Alcohol Use: No   OB History    Gravida Para Term Preterm AB TAB SAB Ectopic Multiple Living   2 2 2       2      Review of Systems  Respiratory: Positive for shortness of breath.   Cardiovascular: Negative for chest pain and leg swelling.  Psychiatric/Behavioral: The patient is nervous/anxious.   All other systems reviewed and are negative.  Allergies  Latex; Motrin; and  Bextra  Home Medications   Prior to Admission medications   Medication Sig Start Date End Date Taking? Authorizing Provider  acetaminophen (TYLENOL) 500 MG tablet Take 1,000 mg by mouth daily as needed for mild pain.    Historical Provider, MD  aspirin EC 81 MG tablet Take 81 mg by mouth daily.    Historical Provider, MD  Calcium Carbonate-Vitamin D (CALCIUM 600+D3 PO) Take 1 tablet by mouth 2 (two) times daily.    Historical Provider, MD  carbamide peroxide (DEBROX) 6.5 % otic solution Place 1 drop into both ears daily as needed (ear wax buildup).    Historical Provider, MD  cycloSPORINE (RESTASIS) 0.05 % ophthalmic emulsion Place 1 drop into both eyes 2 (two) times daily.    Historical Provider, MD  felodipine (PLENDIL) 5 MG 24 hr tablet Take 10 mg by mouth daily.     Historical Provider, MD  fluticasone (FLONASE) 50 MCG/ACT nasal spray Place 2 sprays into the nose daily as needed for allergies.    Historical Provider, MD  hydrALAZINE (APRESOLINE) 50 MG tablet Take 1 tablet (50 mg total) by mouth 2 (two) times daily. 10/15/15   Arnoldo Lenis, MD  hydrOXYzine (ATARAX/VISTARIL) 25 MG tablet Take one pill at bedtime to help with sleep Patient taking differently: Take  25 mg by mouth at bedtime as needed (for sleep).  08/30/15   Milton Ferguson, MD  Liniments [facility] PAIN RELIEF PATCH) PADS Apply 1 each topically daily as needed (pain).    Historical Provider, MD  losartan (COZAAR) 100 MG tablet Take 100 mg by mouth daily.    Historical Provider, MD  Multiple Vitamins-Minerals (CENTRUM ADULTS PO) Take 1 tablet by mouth daily.    Historical Provider, MD  Omega-3 Fatty Acids (FISH OIL TRIPLE STRENGTH) 1400 MG CAPS Take 1 tablet by mouth daily.    Historical Provider, MD  Polyethyl Glycol-Propyl Glycol (SYSTANE OP) Apply 1 drop to eye 4 (four) times daily.    Historical Provider, MD  tretinoin (RETIN-A) 0.025 % cream Apply 1 application topically at bedtime.  06/27/15   Historical Provider, MD   BP  143/57 mmHg  Pulse 72  Temp(Src) 97.9 F (36.6 C) (Oral)  Resp 20  Ht 5\' 3"  (1.6 m)  Wt 128 lb (58.06 kg)  BMI 22.68 kg/m2  SpO2 97% Physical Exam  Constitutional: She appears well-developed and well-nourished. No distress.  HENT:  Head: Normocephalic and atraumatic.  Eyes: Conjunctivae and EOM are normal.  Neck: Neck supple. No tracheal deviation present.  Cardiovascular: Normal rate and regular rhythm.   Pulmonary/Chest: Effort normal and breath sounds normal. No respiratory distress. She has no wheezes.  Abdominal: Soft. There is no tenderness.  Skin: Skin is warm and dry.  Psychiatric: She has a normal mood and affect. Her behavior is normal.  Nursing note and vitals reviewed.   ED Course  Procedures  DIAGNOSTIC STUDIES: Oxygen Saturation is 100% on RA, normal by my interpretation.    COORDINATION OF CARE: 8:49 AM Discussed treatment plan with pt which includes lab work and a chest x-ray. Pt agreed to plan.   Labs Review Labs Reviewed  BASIC METABOLIC PANEL - Abnormal; Notable for the following:    Sodium 133 (*)    Potassium 3.4 (*)    Chloride 96 (*)    Glucose, Bld 122 (*)    All other components within normal limits  CBC WITH DIFFERENTIAL/PLATELET  TROPONIN I   Imaging Review Dg Chest 2 View  10/30/2015  CLINICAL DATA:  Shortness of breath EXAM: CHEST  2 VIEW COMPARISON:  10/11/2015 chest radiograph. FINDINGS: Stable cardiomediastinal silhouette with normal heart size. No pneumothorax. No pleural effusion. Clear lungs, with no focal lung consolidation and no pulmonary edema. IMPRESSION: No active cardiopulmonary disease. Electronically Signed   By: Ilona Sorrel M.D.   On: 10/30/2015 10:12   I have personally reviewed and evaluated these images and lab results as part of my medical decision-making.   EKG Interpretation   Date/Time:  Wednesday October 30 2015 08:34:34 EST Ventricular Rate:  74 PR Interval:  145 QRS Duration: 101 QT Interval:  378 QTC  Calculation: 419 R Axis:   -26 Text Interpretation:  Sinus rhythm Left ventricular hypertrophy Baseline  wander in lead(s) II III aVF V3 Confirmed by Draeden Kellman  MD, Deyon Chizek ([id number]) on  10/30/2015 9:12:52 AM      MDM   Final diagnoses:  Dyspnea    Patient is in no acute distress. Screening labs, EKG, troponin, chest x-ray showed no acute pathology. Follow-up with primary care doctor.   I personally performed the services described in this documentation, which was scribed in my presence. The recorded information has been reviewed and is accurate.        Nat Christen, MD 10/30/15 1051

## 2015-10-30 NOTE — Discharge Instructions (Signed)
Tests were good. Rest. Decrease salt in your diet. Follow-up your primary care doctor.

## 2015-10-30 NOTE — ED Notes (Signed)
Pt complain of being SOB during the night. States she started a new medication two days ago. Also, states she feels anxious and weak.

## 2015-11-04 ENCOUNTER — Emergency Department (HOSPITAL_COMMUNITY)
Admission: EM | Admit: 2015-11-04 | Discharge: 2015-11-04 | Disposition: A | Discharge status: Home / Self Care | Payer: Medicare Other | Attending: Emergency Medicine | Admitting: Emergency Medicine

## 2015-11-04 ENCOUNTER — Encounter (HOSPITAL_COMMUNITY): Payer: Self-pay | Admitting: *Deleted

## 2015-11-04 ENCOUNTER — Emergency Department (HOSPITAL_COMMUNITY): Payer: Medicare Other

## 2015-11-04 ENCOUNTER — Telehealth: Payer: Self-pay | Admitting: Cardiology

## 2015-11-04 ENCOUNTER — Telehealth: Payer: Self-pay | Admitting: *Deleted

## 2015-11-04 DIAGNOSIS — Z79899 Other long term (current) drug therapy: Secondary | ICD-10-CM | POA: Diagnosis not present

## 2015-11-04 DIAGNOSIS — R0602 Shortness of breath: Secondary | ICD-10-CM | POA: Diagnosis present

## 2015-11-04 DIAGNOSIS — R0600 Dyspnea, unspecified: Secondary | ICD-10-CM | POA: Diagnosis not present

## 2015-11-04 DIAGNOSIS — F419 Anxiety disorder, unspecified: Secondary | ICD-10-CM | POA: Insufficient documentation

## 2015-11-04 DIAGNOSIS — Z7982 Long term (current) use of aspirin: Secondary | ICD-10-CM | POA: Diagnosis not present

## 2015-11-04 DIAGNOSIS — I10 Essential (primary) hypertension: Secondary | ICD-10-CM | POA: Insufficient documentation

## 2015-11-04 DIAGNOSIS — Z7951 Long term (current) use of inhaled steroids: Secondary | ICD-10-CM | POA: Diagnosis not present

## 2015-11-04 DIAGNOSIS — Z853 Personal history of malignant neoplasm of breast: Secondary | ICD-10-CM | POA: Insufficient documentation

## 2015-11-04 DIAGNOSIS — Z91040 Latex allergy status: Secondary | ICD-10-CM | POA: Diagnosis not present

## 2015-11-04 LAB — COMPREHENSIVE METABOLIC PANEL
ALBUMIN: 4.1 g/dL (ref 3.5–5.0)
ALT: 34 U/L (ref 14–54)
AST: 27 U/L (ref 15–41)
Alkaline Phosphatase: 55 U/L (ref 38–126)
Anion gap: 12 (ref 5–15)
BUN: 16 mg/dL (ref 6–20)
CALCIUM: 9.7 mg/dL (ref 8.9–10.3)
CHLORIDE: 93 mmol/L — AB (ref 101–111)
CO2: 27 mmol/L (ref 22–32)
Creatinine, Ser: 1.05 mg/dL — ABNORMAL HIGH (ref 0.44–1.00)
GFR calc non Af Amer: 47 mL/min — ABNORMAL LOW (ref >60)
GFR, EST AFRICAN AMERICAN: 55 mL/min — AB (ref >60)
GLUCOSE: 137 mg/dL — AB (ref 65–99)
POTASSIUM: 3.5 mmol/L (ref 3.5–5.1)
Sodium: 132 mmol/L — ABNORMAL LOW (ref 135–145)
TOTAL PROTEIN: 7.2 g/dL (ref 6.5–8.1)
Total Bilirubin: 0.6 mg/dL (ref 0.3–1.2)

## 2015-11-04 LAB — I-STAT TROPONIN, ED: Troponin i, poc: 0.02 ng/mL (ref 0.00–0.08)

## 2015-11-04 LAB — CBC WITH DIFFERENTIAL/PLATELET
Basophils Absolute: 0 10*3/uL (ref 0.0–0.1)
Basophils Relative: 0 %
EOS ABS: 0 10*3/uL (ref 0.0–0.7)
EOS PCT: 0 %
HCT: 38.9 % (ref 36.0–46.0)
Hemoglobin: 13.2 g/dL (ref 12.0–15.0)
LYMPHS ABS: 2 10*3/uL (ref 0.7–4.0)
Lymphocytes Relative: 36 %
MCH: 29.4 pg (ref 26.0–34.0)
MCHC: 33.9 g/dL (ref 30.0–36.0)
MCV: 86.6 fL (ref 78.0–100.0)
MONOS PCT: 7 %
Monocytes Absolute: 0.4 10*3/uL (ref 0.1–1.0)
Neutro Abs: 3.2 10*3/uL (ref 1.7–7.7)
Neutrophils Relative %: 57 %
PLATELETS: 387 10*3/uL (ref 150–400)
RBC: 4.49 MIL/uL (ref 3.87–5.11)
RDW: 14 % (ref 11.5–15.5)
WBC: 5.5 10*3/uL (ref 4.0–10.5)

## 2015-11-04 LAB — BLOOD GAS, ARTERIAL
Acid-Base Excess: 4 mmol/L — ABNORMAL HIGH (ref 0.0–2.0)
Bicarbonate: 29.5 mEq/L — ABNORMAL HIGH (ref 20.0–24.0)
Drawn by: 277331
FIO2: 0.21
O2 SAT: 99 %
PCO2 ART: 22.8 mmHg — AB (ref 35.0–45.0)
PO2 ART: 117 mmHg — AB (ref 80.0–100.0)
Patient temperature: 37
pH, Arterial: 7.649 (ref 7.350–7.450)

## 2015-11-04 MED ORDER — IOHEXOL 350 MG/ML SOLN
100.0000 mL | Freq: Once | INTRAVENOUS | Status: AC | PRN
  Administered 2015-11-04: 100 mL via INTRAVENOUS

## 2015-11-04 MED ORDER — LORAZEPAM 1 MG PO TABS
1.0000 mg | ORAL_TABLET | Freq: Once | ORAL | Status: AC
  Administered 2015-11-04: 1 mg via ORAL
  Filled 2015-11-04: qty 1

## 2015-11-04 MED ORDER — LORAZEPAM 1 MG PO TABS: ORAL_TABLET | ORAL | Status: DC

## 2015-11-04 NOTE — ED Notes (Signed)
Pt states that she is feeling much better at this time ? ?

## 2015-11-04 NOTE — ED Notes (Signed)
CRITICAL VALUE ALERT  Critical value received:  ABG PH 7.649, CO2 22.8, PO2 117, Bicarb 29.5  Date of notification:  11/04/15  Time of notification:  [id number]  Critical value read back:Yes.    Nurse who received alert:  Rminter, RN  MD notified (1st page):  Dr. Roderic Palau  Time of first page:  1125  MD notified (2nd page):  Time of second page:  Responding MD:  Dr. Roderic Palau  Time MD responded:  1125

## 2015-11-04 NOTE — ED Notes (Signed)
Patient c/o SOB that started this morning. States she has been here for same recently, has not seen PCP yet.

## 2015-11-04 NOTE — ED Provider Notes (Signed)
CSN: [id number]     Arrival date & time 11/04/15  1027 History  By signing my name below, I, Soijett Blue, attest that this documentation has been prepared under the direction and in the presence of Milton Ferguson, MD. Electronically Signed: Soijett Blue, ED Scribe. 11/04/2015. 10:51 AM.   Chief Complaint  Patient presents with  . Shortness of Breath      Patient is a 81 y.o. female presenting with shortness of breath. The history is provided by the patient (the patient). No language interpreter was used.  Shortness of Breath Severity:  Moderate Onset quality:  Gradual Duration:  5 days Timing:  Intermittent Progression:  Unchanged Chronicity:  Recurrent Relieved by:  None tried Worsened by:  Nothing tried Ineffective treatments:  None tried Associated symptoms: no abdominal pain, no chest pain, no cough, no headaches and no rash   Risk factors: no hx of cancer and no oral contraceptive use     Charlotte [name] is a 81 y.o. female with a medical hx of HTN who presents to the Emergency Department complaining of constant, moderate, SOB onset 5 days ago. Pt reports that she was seen in the ED on 10/30/15 for SOB and was treated and had negative lab, xray, and EKG results. She was initially seen for SOB following the election results and was informed to follow up with her PCP to which she ddin't. She notes that she recently had a medication change to ativan and she recently had two deaths in the family. Pt PCP is Dr. Legrand Rams and her cardiologist is Dr. Harl Bowie whom she called this morning to inform that she was coming to the ED. She notes that she has an appointment with her PCP Dr. Legrand Rams today at 2 PM. Pt family member states that this is the 8th time that the pt ahs been seen for her SOB. She notes that she has not tried any medications for the relief of her symptoms. She denies any other symptoms.    Past Medical History  Diagnosis Date  . Hypertension   . Cancer [facility])     breast cancer  .  Fatigue    Past Surgical History  Procedure Laterality Date  . Breast lumpectomy      right side  . Tonsillectomy    . Dilation and curettage of uterus    . Foot surgery     Family History  Problem Relation Age of Onset  . Heart attack Mother   . Cancer Mother    Social History  Substance Use Topics  . Smoking status: Never Smoker   . Smokeless tobacco: Never Used  . Alcohol Use: No   OB History    Gravida Para Term Preterm AB TAB SAB Ectopic Multiple Living   2 2 2       2      Review of Systems  Constitutional: Negative for appetite change and fatigue.  HENT: Negative for congestion, ear discharge and sinus pressure.   Eyes: Negative for discharge.  Respiratory: Positive for shortness of breath. Negative for cough.   Cardiovascular: Negative for chest pain.  Gastrointestinal: Negative for abdominal pain and diarrhea.  Genitourinary: Negative for frequency and hematuria.  Musculoskeletal: Negative for back pain.  Skin: Negative for rash.  Neurological: Negative for seizures and headaches.  Psychiatric/Behavioral: Negative for hallucinations.     Allergies  Latex; Motrin; and Bextra  Home Medications   Prior to Admission medications   Medication Sig Start Date End Date Taking? Authorizing Provider  acetaminophen (TYLENOL) 500 MG tablet Take 1,000 mg by mouth daily as needed for mild pain.    Historical Provider, MD  aspirin EC 81 MG tablet Take 81 mg by mouth daily.    Historical Provider, MD  Calcium Carbonate-Vitamin D (CALCIUM 600+D3 PO) Take 1 tablet by mouth 2 (two) times daily.    Historical Provider, MD  carbamide peroxide (DEBROX) 6.5 % otic solution Place 1 drop into both ears daily as needed (ear wax buildup).    Historical Provider, MD  cycloSPORINE (RESTASIS) 0.05 % ophthalmic emulsion Place 1 drop into both eyes 2 (two) times daily.    Historical Provider, MD  felodipine (PLENDIL) 5 MG 24 hr tablet Take 10 mg by mouth daily.     Historical Provider, MD   fluticasone (FLONASE) 50 MCG/ACT nasal spray Place 2 sprays into the nose daily as needed for allergies.    Historical Provider, MD  hydrALAZINE (APRESOLINE) 50 MG tablet Take 1 tablet (50 mg total) by mouth 2 (two) times daily. 10/15/15   Arnoldo Lenis, MD  hydrOXYzine (ATARAX/VISTARIL) 25 MG tablet Take one pill at bedtime to help with sleep Patient taking differently: Take 25 mg by mouth at bedtime as needed (for sleep).  08/30/15   Milton Ferguson, MD  [facility] PAIN RELIEF PATCH) PADS Apply 1 each topically daily as needed (pain).    Historical Provider, MD  losartan (COZAAR) 100 MG tablet Take 100 mg by mouth daily.    Historical Provider, MD  Multiple Vitamins-Minerals (CENTRUM ADULTS PO) Take 1 tablet by mouth daily.    Historical Provider, MD  Omega-3 Fatty Acids (FISH OIL TRIPLE STRENGTH) 1400 MG CAPS Take 1 tablet by mouth daily.    Historical Provider, MD  Polyethyl Glycol-Propyl Glycol (SYSTANE OP) Apply 1 drop to eye 4 (four) times daily.    Historical Provider, MD  tretinoin (RETIN-A) 0.025 % cream Apply 1 application topically at bedtime.  06/27/15   Historical Provider, MD   BP 181/82 mmHg  Pulse 71  Temp(Src) 98 F (36.7 C) (Oral)  Resp 18  Ht 5\' 5"  (1.651 m)  Wt 130 lb (58.968 kg)  BMI 21.63 kg/m2  SpO2 100% Physical Exam  Constitutional: She is oriented to person, place, and time. She appears well-developed.  HENT:  Head: Normocephalic.  Eyes: Conjunctivae and EOM are normal. No scleral icterus.  Neck: Neck supple. No thyromegaly present.  Cardiovascular: Normal rate and regular rhythm.  Exam reveals no gallop and no friction rub.   No murmur heard. Pulmonary/Chest: No stridor. She has no wheezes. She has no rales. She exhibits no tenderness.  Dyspneic   Abdominal: She exhibits no distension. There is no tenderness. There is no rebound.  Musculoskeletal: Normal range of motion. She exhibits no edema.  Lymphadenopathy:    She has no cervical adenopathy.   Neurological: She is oriented to person, place, and time. She exhibits normal muscle tone. Coordination normal.  Skin: No rash noted. No erythema.  Psychiatric: She has a normal mood and affect. Her behavior is normal.  Nursing note and vitals reviewed.   ED Course  Procedures (including critical care time) DIAGNOSTIC STUDIES: Oxygen Saturation is 100% on RA, nl by my interpretation.    COORDINATION OF CARE: 10:50 AM Discussed treatment plan with pt at bedside which includes CXR, CT angio chest PE, EKG, and labs and pt agreed to plan.    Labs Review Labs Reviewed  CBC WITH DIFFERENTIAL/PLATELET  COMPREHENSIVE METABOLIC PANEL  BLOOD GAS, ARTERIAL  Randolm Idol, ED    Imaging Review No results found. I have personally reviewed and evaluated these images and lab results as part of my medical decision-making.   EKG Interpretation None      MDM   Final diagnoses:  None    Patient has had dyspnea. She has been worked up by cardiology and been to the emergency department several times for dyspnea. Labs and EKG were unremarkable except ABG suggests hyperventilation. I will give the patient some Ativan she is to follow-up with her family doctor   The chart was scribed for me under my direct supervision.  I personally performed the history, physical, and medical decision making and all procedures in the evaluation of this patient.Milton Ferguson, MD 11/04/15 [phone]

## 2015-11-04 NOTE — Telephone Encounter (Signed)
Pt went to the ER for her SOB and they dx her w/ Anxiety, the ER Dr. Doristine Section her on Ativan and she would like to know if she needs to continue taking Hydralazine since she was experiencing side affects.

## 2015-11-04 NOTE — Telephone Encounter (Signed)
Noted will forward to Dr Harl Bowie

## 2015-11-04 NOTE — ED Notes (Signed)
Pt states that she needs something for her nerves and that she is starting to feel short of breath again.

## 2015-11-04 NOTE — Discharge Instructions (Signed)
Follow up with your family md this week °

## 2015-11-04 NOTE — Telephone Encounter (Signed)
Patient called and states that she is allergic to Hydralazine. Patient c/o SOB that started this morning. Pt denies chest pain,and  dizziness at this time. Hydralazine was increased to to 50 mg two times daily 10/25. Pt states she only took Hydralazine 25 mg this morning. Patient reports BP this morning as 185/82 and HR of 88. Pt was encouraged to be evaluated in the ED for SOB. Patient states she would like to call her PCP before going to the ED.

## 2015-11-05 NOTE — Telephone Encounter (Signed)
Her fatigue is not related to her hydrlazine. I would continue it for now.   Zandra Abts MD

## 2015-11-05 NOTE — Telephone Encounter (Signed)
Called pt to let her know to stay on hydralazine, because her fatigue was not coming from that medication. She voiced understanding and said she will continue taking it.

## 2015-11-05 NOTE — Telephone Encounter (Signed)
BP log reviewed, bp's are at goal. Continue currnet meds   Zandra Abts MD

## 2015-11-05 NOTE — Telephone Encounter (Signed)
Pt states she had severe fatigue with hydralazine yet she was given ambien for sleep and ativan for anxiety

## 2015-11-05 NOTE — Telephone Encounter (Signed)
What side effects is she having on hydralazine? We need to be sure any symptoms are related to medications before we document an allergy for her. Three previous bp meds have been changed due to nonspecific symptoms  Charlotte Abts MD

## 2015-11-07 DIAGNOSIS — F329 Major depressive disorder, single episode, unspecified: Secondary | ICD-10-CM | POA: Diagnosis not present

## 2015-11-07 DIAGNOSIS — F419 Anxiety disorder, unspecified: Secondary | ICD-10-CM | POA: Diagnosis not present

## 2015-12-03 ENCOUNTER — Emergency Department (HOSPITAL_COMMUNITY): Payer: Medicare Other

## 2015-12-03 ENCOUNTER — Emergency Department (HOSPITAL_COMMUNITY)
Admission: EM | Admit: 2015-12-03 | Discharge: 2015-12-03 | Disposition: A | Discharge status: Home / Self Care | Payer: Medicare Other | Attending: Emergency Medicine | Admitting: Emergency Medicine

## 2015-12-03 ENCOUNTER — Encounter (HOSPITAL_COMMUNITY): Payer: Self-pay | Admitting: *Deleted

## 2015-12-03 DIAGNOSIS — Z91040 Latex allergy status: Secondary | ICD-10-CM | POA: Insufficient documentation

## 2015-12-03 DIAGNOSIS — F419 Anxiety disorder, unspecified: Secondary | ICD-10-CM | POA: Diagnosis not present

## 2015-12-03 DIAGNOSIS — Z7982 Long term (current) use of aspirin: Secondary | ICD-10-CM | POA: Diagnosis not present

## 2015-12-03 DIAGNOSIS — Z79899 Other long term (current) drug therapy: Secondary | ICD-10-CM | POA: Diagnosis not present

## 2015-12-03 DIAGNOSIS — Z853 Personal history of malignant neoplasm of breast: Secondary | ICD-10-CM | POA: Diagnosis not present

## 2015-12-03 DIAGNOSIS — R0600 Dyspnea, unspecified: Secondary | ICD-10-CM | POA: Diagnosis not present

## 2015-12-03 DIAGNOSIS — E876 Hypokalemia: Secondary | ICD-10-CM | POA: Diagnosis not present

## 2015-12-03 DIAGNOSIS — I10 Essential (primary) hypertension: Secondary | ICD-10-CM | POA: Diagnosis not present

## 2015-12-03 DIAGNOSIS — R0602 Shortness of breath: Secondary | ICD-10-CM | POA: Diagnosis not present

## 2015-12-03 HISTORY — DX: Anxiety disorder, unspecified: F41.9

## 2015-12-03 LAB — BASIC METABOLIC PANEL
Anion gap: 11 (ref 5–15)
BUN: 18 mg/dL (ref 6–20)
CO2: 26 mmol/L (ref 22–32)
Calcium: 9.6 mg/dL (ref 8.9–10.3)
Chloride: 95 mmol/L — ABNORMAL LOW (ref 101–111)
Creatinine, Ser: 0.92 mg/dL (ref 0.44–1.00)
GFR calc Af Amer: >60 mL/min (ref >60)
GFR calc non Af Amer: 55 mL/min — ABNORMAL LOW (ref >60)
Glucose, Bld: 110 mg/dL — ABNORMAL HIGH (ref 65–99)
Potassium: 3 mmol/L — ABNORMAL LOW (ref 3.5–5.1)
Sodium: 132 mmol/L — ABNORMAL LOW (ref 135–145)

## 2015-12-03 LAB — CBC WITH DIFFERENTIAL/PLATELET
Basophils Absolute: 0 10*3/uL (ref 0.0–0.1)
Basophils Relative: 0 %
Eosinophils Absolute: 0 10*3/uL (ref 0.0–0.7)
Eosinophils Relative: 1 %
HCT: 35.3 % — ABNORMAL LOW (ref 36.0–46.0)
Hemoglobin: 12 g/dL (ref 12.0–15.0)
Lymphocytes Relative: 49 %
Lymphs Abs: 2.7 10*3/uL (ref 0.7–4.0)
MCH: 29.5 pg (ref 26.0–34.0)
MCHC: 34 g/dL (ref 30.0–36.0)
MCV: 86.7 fL (ref 78.0–100.0)
Monocytes Absolute: 0.5 10*3/uL (ref 0.1–1.0)
Monocytes Relative: 8 %
Neutro Abs: 2.3 10*3/uL (ref 1.7–7.7)
Neutrophils Relative %: 42 %
Platelets: 339 10*3/uL (ref 150–400)
RBC: 4.07 MIL/uL (ref 3.87–5.11)
RDW: 13.9 % (ref 11.5–15.5)
WBC: 5.5 10*3/uL (ref 4.0–10.5)

## 2015-12-03 LAB — TROPONIN I: Troponin I: <0.03 ng/mL (ref <0.031)

## 2015-12-03 MED ORDER — POTASSIUM CHLORIDE CRYS ER 20 MEQ PO TBCR
40.0000 meq | EXTENDED_RELEASE_TABLET | Freq: Once | ORAL | Status: AC
  Administered 2015-12-03: 40 meq via ORAL
  Filled 2015-12-03: qty 2

## 2015-12-03 NOTE — ED Notes (Signed)
Pt c/o sob, states that she had woken up, was trying to get back to sleep when she started getting sob, has hx of anxiety, states that she took one 1mg  tablet of ativan with no improvement

## 2015-12-03 NOTE — ED Provider Notes (Signed)
CSN: [id number]     Arrival date & time 12/03/15  [id number] History   First MD Initiated Contact with Patient 12/03/15 0536     Chief Complaint  Patient presents with  . Shortness of Breath     (Consider location/radiation/quality/duration/timing/severity/associated sxs/prior Treatment) HPI   81 year old female with dyspnea. Onset when she woke up this morning. Try to go back to sleep but states that she just felt her short of breath. Patient has a history of anxiety. She took 1 mg of Ativan when no improvement of her symptoms so came to the emergency room. Family at bedside. They reports similar repeated symptoms as today over the last several weeks to month. Seems to be most pronounced in the morning hours. She currently feels somewhat better but still a bit short of breath. Denies any acute pain anywhere. No cough. No fevers or chills. Says felt fine when went to bed. No unusual leg pain or swelling. Didn't notice difference in breathing when laying versus sitting up.   Past Medical History  Diagnosis Date  . Hypertension   . Cancer [facility])     breast cancer  . Fatigue   . Anxiety    Past Surgical History  Procedure Laterality Date  . Breast lumpectomy      right side  . Tonsillectomy    . Dilation and curettage of uterus    . Foot surgery     Family History  Problem Relation Age of Onset  . Heart attack Mother   . Cancer Mother    Social History  Substance Use Topics  . Smoking status: Never Smoker   . Smokeless tobacco: Never Used  . Alcohol Use: No   OB History    Gravida Para Term Preterm AB TAB SAB Ectopic Multiple Living   2 2 2       2      Review of Systems  All systems reviewed and negative, other than as noted in HPI.   Allergies  Latex; Motrin; and Bextra  Home Medications   Prior to Admission medications   Medication Sig Start Date End Date Taking? Authorizing Provider  acetaminophen (TYLENOL) 500 MG tablet Take 1,000 mg by mouth daily as needed for  mild pain.   Yes Historical Provider, MD  aspirin EC 81 MG tablet Take 81 mg by mouth daily.   Yes Historical Provider, MD  Calcium Carbonate-Vitamin D (CALCIUM 600+D3 PO) Take 1 tablet by mouth 2 (two) times daily.   Yes Historical Provider, MD  carbamide peroxide (DEBROX) 6.5 % otic solution Place 1 drop into both ears daily as needed (ear wax buildup).   Yes Historical Provider, MD  cycloSPORINE (RESTASIS) 0.05 % ophthalmic emulsion Place 1 drop into both eyes 2 (two) times daily.   Yes Historical Provider, MD  escitalopram (LEXAPRO) 10 MG tablet Take 10 mg by mouth daily.   Yes Historical Provider, MD  felodipine (PLENDIL) 5 MG 24 hr tablet Take 10 mg by mouth daily.    Yes Historical Provider, MD  fluticasone (FLONASE) 50 MCG/ACT nasal spray Place 2 sprays into the nose daily as needed for allergies.   Yes Historical Provider, MD  hydrALAZINE (APRESOLINE) 50 MG tablet Take 1 tablet (50 mg total) by mouth 2 (two) times daily. Patient taking differently: Take 25 mg by mouth 2 (two) times daily.  10/15/15  Yes Arnoldo Lenis, MD  [facility] PAIN RELIEF PATCH) PADS Apply 1 each topically daily as needed (pain).   Yes Historical Provider,  MD  LORazepam (ATIVAN) 1 MG tablet Take one every 8 hours if needed for anxiety and shortness of breath 11/04/15  Yes Milton Ferguson, MD  losartan (COZAAR) 100 MG tablet Take 100 mg by mouth daily.   Yes Historical Provider, MD  Multiple Vitamins-Minerals (CENTRUM ADULTS PO) Take 1 tablet by mouth daily.   Yes Historical Provider, MD  Omega-3 Fatty Acids (FISH OIL TRIPLE STRENGTH) 1400 MG CAPS Take 1 tablet by mouth daily.   Yes Historical Provider, MD  Polyethyl Glycol-Propyl Glycol (SYSTANE OP) Apply 1 drop to eye 4 (four) times daily.   Yes Historical Provider, MD  tretinoin (RETIN-A) 0.025 % cream Apply 1 application topically at bedtime.  06/27/15  Yes Historical Provider, MD  hydrOXYzine (ATARAX/VISTARIL) 25 MG tablet Take one pill at bedtime to help  with sleep Patient not taking: Reported on 11/04/2015 08/30/15   Milton Ferguson, MD  zolpidem (AMBIEN) 5 MG tablet Take 5 mg by mouth at bedtime as needed for sleep.    Historical Provider, MD   BP 129/59 mmHg  Pulse 53  Temp(Src) 97.9 F (36.6 C) (Oral)  Resp 17  Ht 5\' 2"  (1.575 m)  Wt 129 lb (58.514 kg)  BMI 23.59 kg/m2  SpO2 99% Physical Exam  Constitutional: She appears well-developed and well-nourished. No distress.  HENT:  Head: Normocephalic and atraumatic.  Eyes: Conjunctivae are normal. Right eye exhibits no discharge. Left eye exhibits no discharge.  Neck: Neck supple.  Cardiovascular: Normal rate, regular rhythm and normal heart sounds.  Exam reveals no gallop and no friction rub.   No murmur heard. Pulmonary/Chest: Effort normal and breath sounds normal. No respiratory distress.  Abdominal: Soft. She exhibits no distension. There is no tenderness.  Musculoskeletal: She exhibits no edema or tenderness.  Lower extremities symmetric as compared to each other. No calf tenderness. Negative Homan's. No palpable cords.   Neurological: She is alert.  Skin: Skin is warm and dry.  Psychiatric: She has a normal mood and affect. Her behavior is normal. Thought content normal.  Nursing note and vitals reviewed.   ED Course  Procedures (including critical care time) Labs Review Labs Reviewed  CBC WITH DIFFERENTIAL/PLATELET - Abnormal; Notable for the following:    HCT 35.3 (*)    All other components within normal limits  BASIC METABOLIC PANEL - Abnormal; Notable for the following:    Sodium 132 (*)    Potassium 3.0 (*)    Chloride 95 (*)    Glucose, Bld 110 (*)    GFR calc non Af Amer 55 (*)    All other components within normal limits  TROPONIN I    Imaging Review No results found. I have personally reviewed and evaluated these images and lab results as part of my medical decision-making.   EKG Interpretation   Date/Time:  Tuesday December 03 2015 05:35:44  EST Ventricular Rate:  62 PR Interval:  147 QRS Duration: 100 QT Interval:  400 QTC Calculation: 406 R Axis:   -20 Text Interpretation:  Sinus rhythm Atrial premature complex Probable left  ventricular hypertrophy No significant change since last tracing Confirmed  by [place]  MD, Journii Nierman ([id number]) on 12/03/2015 6:51:19 AM      MDM   Final diagnoses:  Dyspnea  Hypokalemia    87yF with dyspnea. Suspect may actually be anxiety. Appears well. No increased WOB. Lungs clear. Normal o2 sats. CXR w/o acute process. Doubt atypical ACS, PE or other emergent process. Mild hyponatremia/hypokalemia which are likely noncontributory. It has  been determined that no acute conditions requiring further emergency intervention are present at this time. The patient has been advised of the diagnosis and plan. I reviewed any labs and imaging including any potential incidental findings. We have discussed signs and symptoms that warrant return to the ED and they are listed in the discharge instructions.      Virgel Manifold, MD 12/17/15 [phone]

## 2015-12-03 NOTE — Discharge Instructions (Signed)
Hypokalemia Hypokalemia means that the amount of potassium in the blood is lower than normal.Potassium is a chemical, called an electrolyte, that helps regulate the amount of fluid in the body. It also stimulates muscle contraction and helps nerves function properly.Most of the body's potassium is inside of cells, and only a very small amount is in the blood. Because the amount in the blood is so small, minor changes can be life-threatening. CAUSES  Antibiotics.  Diarrhea or vomiting.  Using laxatives too much, which can cause diarrhea.  Chronic kidney disease.  Water pills (diuretics).  Eating disorders (bulimia).  Low magnesium level.  Sweating a lot. SIGNS AND SYMPTOMS  Weakness.  Constipation.  Fatigue.  Muscle cramps.  Mental confusion.  Skipped heartbeats or irregular heartbeat (palpitations).  Tingling or numbness. DIAGNOSIS  Your health care provider can diagnose hypokalemia with blood tests. In addition to checking your potassium level, your health care provider may also check other lab tests. TREATMENT Hypokalemia can be treated with potassium supplements taken by mouth or adjustments in your current medicines. If your potassium level is very low, you may need to get potassium through a vein (IV) and be monitored in the hospital. A diet high in potassium is also helpful. Foods high in potassium are:  Nuts, such as peanuts and pistachios.  Seeds, such as sunflower seeds and pumpkin seeds.  Peas, lentils, and lima beans.  Whole grain and bran cereals and breads.  Fresh fruit and vegetables, such as apricots, avocado, bananas, cantaloupe, kiwi, oranges, tomatoes, asparagus, and potatoes.  Orange and tomato juices.  Red meats.  Fruit yogurt. HOME CARE INSTRUCTIONS  Take all medicines as prescribed by your health care provider.  Maintain a healthy diet by including nutritious food, such as fruits, vegetables, nuts, whole grains, and lean meats.  If  you are taking a laxative, be sure to follow the directions on the label. SEEK MEDICAL CARE IF:  Your weakness gets worse.  You feel your heart pounding or racing.  You are vomiting or having diarrhea.  You are diabetic and having trouble keeping your blood glucose in the normal range. SEEK IMMEDIATE MEDICAL CARE IF:  You have chest pain, shortness of breath, or dizziness.  You are vomiting or having diarrhea for more than 2 days.  You faint. MAKE SURE YOU:   Understand these instructions.  Will watch your condition.  Will get help right away if you are not doing well or get worse.   This information is not intended to replace advice given to you by your health care provider. Make sure you discuss any questions you have with your health care provider.   Document Released: 12/07/2005 Document Revised: 12/28/2014 Document Reviewed: 06/09/2013 Elsevier Interactive Patient Education 2016 [place] of Breath Shortness of breath means you have trouble breathing. Shortness of breath needs medical care right away. HOME CARE   Do not smoke.  Avoid being around chemicals or things (paint fumes, dust) that may bother your breathing.  Rest as needed. Slowly begin your normal activities.  Only take medicines as told by your doctor.  Keep all doctor visits as told. GET HELP RIGHT AWAY IF:   Your shortness of breath gets worse.  You feel lightheaded, pass out (faint), or have a cough that is not helped by medicine.  You cough up blood.  You have pain with breathing.  You have pain in your chest, arms, shoulders, or belly (abdomen).  You have a fever.  You cannot walk  up stairs or exercise the way you normally do.  You do not get better in the time expected.  You have a hard time doing normal activities even with rest.  You have problems with your medicines.  You have any new symptoms. MAKE SURE YOU:  Understand these instructions.  Will watch  your condition.  Will get help right away if you are not doing well or get worse.   This information is not intended to replace advice given to you by your health care provider. Make sure you discuss any questions you have with your health care provider.   Document Released: 05/25/2008 Document Revised: 12/12/2013 Document Reviewed: 02/22/2012 Elsevier Interactive Patient Education [employer].

## 2015-12-05 DIAGNOSIS — F410 Panic disorder [episodic paroxysmal anxiety] without agoraphobia: Secondary | ICD-10-CM | POA: Diagnosis not present

## 2015-12-05 DIAGNOSIS — I10 Essential (primary) hypertension: Secondary | ICD-10-CM | POA: Diagnosis not present

## 2015-12-18 ENCOUNTER — Telehealth (HOSPITAL_COMMUNITY): Payer: Self-pay | Admitting: *Deleted

## 2015-12-18 DIAGNOSIS — H04123 Dry eye syndrome of bilateral lacrimal glands: Secondary | ICD-10-CM | POA: Diagnosis not present

## 2015-12-18 DIAGNOSIS — Z961 Presence of intraocular lens: Secondary | ICD-10-CM | POA: Diagnosis not present

## 2015-12-22 DIAGNOSIS — Z923 Personal history of irradiation: Secondary | ICD-10-CM

## 2015-12-22 HISTORY — DX: Personal history of irradiation: Z92.3

## 2015-12-26 DIAGNOSIS — H6123 Impacted cerumen, bilateral: Secondary | ICD-10-CM | POA: Diagnosis not present

## 2016-01-09 DIAGNOSIS — F331 Major depressive disorder, recurrent, moderate: Secondary | ICD-10-CM | POA: Diagnosis not present

## 2016-01-09 DIAGNOSIS — F419 Anxiety disorder, unspecified: Secondary | ICD-10-CM | POA: Diagnosis not present

## 2016-01-09 DIAGNOSIS — I10 Essential (primary) hypertension: Secondary | ICD-10-CM | POA: Diagnosis not present

## 2016-01-09 DIAGNOSIS — M545 Low back pain: Secondary | ICD-10-CM | POA: Diagnosis not present

## 2016-01-27 ENCOUNTER — Ambulatory Visit (INDEPENDENT_AMBULATORY_CARE_PROVIDER_SITE_OTHER): Payer: Medicare Other | Admitting: Cardiology

## 2016-01-27 ENCOUNTER — Encounter: Payer: Self-pay | Admitting: Cardiology

## 2016-01-27 VITALS — HR 74 | Ht 63.0 in | Wt 126.0 lb

## 2016-01-27 DIAGNOSIS — R5382 Chronic fatigue, unspecified: Secondary | ICD-10-CM | POA: Diagnosis not present

## 2016-01-27 DIAGNOSIS — I10 Essential (primary) hypertension: Secondary | ICD-10-CM

## 2016-01-27 NOTE — Progress Notes (Signed)
Patient ID: Charlotte [name], female   DOB: [birth year], 82 y.o.   MRN: [id number]     Clinical Summary Ms. Lahens is a 82 y.o.female seen today for follow up of the following medical problems.   1. Generalized fatigue - long history of generalized fatigue. No evidence of cardiac etiology.  - 07/2015 echo LVEF 55-60%, no WMAs, grade I diastolic dysfunction, mild MR, PASP 49. Normal RV size and function.  - 08/2015 CXR no acute process. EKG SR with LVH.  - 08/2015 normal TSH, Hgb, trop neg x1.   - denies any chest pain. SOB only with anxiety. Has upcoming appoitment with behavioral health for insomnia and anxiety.   2. HTN - difficult to manage due to reported medication side effects, though it appears that many of the changes may have been related to her chronic fatigue that did not improve with med change.  - Clonidine caused fatigue and was stopped. Felodopine 10mg  caused weakness, changed to 5mg  daily which she is tolerating . Maxide stopped due to fatigue as well as drop in Na and K - recently started on hydralazine 25mg  bid, tolerating well.   - bp log 10/2015 was at goal. Recent anxiety/panic attacks, bps have occasionally been elevated to 150s.    3. Anxiety - reports recent severe anxiety, panic attacks. Has appt with behavioral health  Past Medical History  Diagnosis Date  . Hypertension   . Cancer [facility])     breast cancer  . Fatigue   . Anxiety      Allergies  Allergen Reactions  . Latex Swelling  . Motrin [Ibuprofen] Other (See Comments)    Stomach pain  . Bextra [Valdecoxib] Other (See Comments)    Stomach Pains     Current Outpatient Prescriptions  Medication Sig Dispense Refill  . acetaminophen (TYLENOL) 500 MG tablet Take 1,000 mg by mouth daily as needed for mild pain.    Marland Kitchen aspirin EC 81 MG tablet Take 81 mg by mouth daily.    . Calcium Carbonate-Vitamin D (CALCIUM 600+D3 PO) Take 1 tablet by mouth 2 (two) times daily.    . carbamide peroxide (DEBROX)  6.5 % otic solution Place 1 drop into both ears daily as needed (ear wax buildup).    . cycloSPORINE (RESTASIS) 0.05 % ophthalmic emulsion Place 1 drop into both eyes 2 (two) times daily.    Marland Kitchen escitalopram (LEXAPRO) 10 MG tablet Take 10 mg by mouth daily.    . felodipine (PLENDIL) 5 MG 24 hr tablet Take 10 mg by mouth daily.     . fluticasone (FLONASE) 50 MCG/ACT nasal spray Place 2 sprays into the nose daily as needed for allergies.    . hydrALAZINE (APRESOLINE) 50 MG tablet Take 1 tablet (50 mg total) by mouth 2 (two) times daily. (Patient taking differently: Take 25 mg by mouth 2 (two) times daily. ) 180 tablet 3  . hydrOXYzine (ATARAX/VISTARIL) 25 MG tablet Take one pill at bedtime to help with sleep (Patient not taking: Reported on 11/04/2015) 30 tablet 0  . Liniments (SALONPAS PAIN RELIEF PATCH) PADS Apply 1 each topically daily as needed (pain).    . LORazepam (ATIVAN) 1 MG tablet Take one every 8 hours if needed for anxiety and shortness of breath 20 tablet 0  . losartan (COZAAR) 100 MG tablet Take 100 mg by mouth daily.    . Multiple Vitamins-Minerals (CENTRUM ADULTS PO) Take 1 tablet by mouth daily.    . Omega-3 Fatty Acids (FISH OIL TRIPLE  STRENGTH) 1400 MG CAPS Take 1 tablet by mouth daily.    Vladimir Faster Glycol-Propyl Glycol (SYSTANE OP) Apply 1 drop to eye 4 (four) times daily.    Marland Kitchen tretinoin (RETIN-A) 0.025 % cream Apply 1 application topically at bedtime.   3  . zolpidem (AMBIEN) 5 MG tablet Take 5 mg by mouth at bedtime as needed for sleep.     No current facility-administered medications for this visit.     Past Surgical History  Procedure Laterality Date  . Breast lumpectomy      right side  . Tonsillectomy    . Dilation and curettage of uterus    . Foot surgery       Allergies  Allergen Reactions  . Latex Swelling  . Motrin [Ibuprofen] Other (See Comments)    Stomach pain  . Bextra [Valdecoxib] Other (See Comments)    Stomach Pains      Family History    Problem Relation Age of Onset  . Heart attack Mother   . Cancer Mother      Social History Ms. Evilsizor reports that she has never smoked. She has never used smokeless tobacco. Ms. Califf reports that she does not drink alcohol.   Review of Systems CONSTITUTIONAL: No weight loss, fever, chills, weakness or fatigue.  HEENT: Eyes: No visual loss, blurred vision, double vision or yellow sclerae.No hearing loss, sneezing, congestion, runny nose or sore throat.  SKIN: No rash or itching.  CARDIOVASCULAR: per HPI RESPIRATORY: No shortness of breath, cough or sputum.  GASTROINTESTINAL: No anorexia, nausea, vomiting or diarrhea. No abdominal pain or blood.  GENITOURINARY: No burning on urination, no polyuria NEUROLOGICAL: No headache, dizziness, syncope, paralysis, ataxia, numbness or tingling in the extremities. No change in bowel or bladder control.  MUSCULOSKELETAL: No muscle, back pain, joint pain or stiffness.  LYMPHATICS: No enlarged nodes. No history of splenectomy.  PSYCHIATRIC: + anxiety  ENDOCRINOLOGIC: No reports of sweating, cold or heat intolerance. No polyuria or polydipsia.  Marland Kitchen   Physical Examination Filed Vitals:   01/27/16 1028  Pulse: 74   Filed Vitals:   01/27/16 1028  Height: 5\' 3"  (1.6 m)  Weight: 126 lb (57.153 kg)    Gen: resting comfortably, no acute distress HEENT: no scleral icterus, pupils equal round and reactive, no palptable cervical adenopathy,  CV: RRR, no m/r/g, no jvd Resp: Clear to auscultation bilaterally GI: abdomen is soft, non-tender, non-distended, normal bowel sounds, no hepatosplenomegaly MSK: extremities are warm, no edema.  Skin: warm, no rash Neuro:  no focal deficits Psych: appropriate affect   Diagnostic Studies 07/2015 echo Study Conclusions  - Left ventricle: The cavity size was normal. Wall thickness was normal. Systolic function was normal. The estimated ejection fraction was in the range of 55% to 60%. Wall motion  was normal; there were no regional wall motion abnormalities. Doppler parameters are consistent with abnormal left ventricular relaxation (grade 1 diastolic dysfunction). There is evidence of elevated LA pressure. - Aortic valve: Mildly calcified annulus. Trileaflet; mildly thickened leaflets. Valve area (VTI): 1.78 cm^2. Valve area (Vmax): 1.91 cm^2. - Mitral valve: Mildly calcified annulus. Normal thickness leaflets . There was mild regurgitation. - Left atrium: The atrium was moderately dilated. - Atrial septum: No defect or patent foramen ovale was identified. - Pulmonary arteries: Systolic pressure was moderately increased. PA peak pressure: 49 mm Hg (S). - Technically adequate study.  10/2015 CT PE IMPRESSION: 1. No acute findings identified. No evidence for acute pulmonary embolus. 2. Aortic atherosclerosis and  LAD coronary artery calcification.   Assessment and Plan  1. Generalized fatigue - unclear etiology. Symptoms are not cardiac in description as she has not chest pain, SOB, or DOE. Primarily just feels drained all the time. Prior cardiac testing has been negative, further workup per pcp  2. HTN - had been controlled by recent bp log she submitted a few months ago. With worsening anxiety her SBPs have been occasionally elevated to 150s. She will continue to monitor her home bp's as her anxiety is further treated, if persistently elevated will consider increasing her hydralazine.    F/u 6 months   Arnoldo Lenis, M.D.

## 2016-01-27 NOTE — Patient Instructions (Signed)

## 2016-01-28 ENCOUNTER — Encounter (HOSPITAL_COMMUNITY): Payer: Self-pay | Admitting: Psychiatry

## 2016-01-28 ENCOUNTER — Ambulatory Visit (INDEPENDENT_AMBULATORY_CARE_PROVIDER_SITE_OTHER): Payer: Medicare Other | Admitting: Psychiatry

## 2016-01-28 VITALS — BP 172/74 | HR 73 | Ht 63.0 in | Wt 128.0 lb

## 2016-01-28 DIAGNOSIS — F411 Generalized anxiety disorder: Secondary | ICD-10-CM

## 2016-01-28 MED ORDER — ZOLPIDEM TARTRATE 5 MG PO TABS: 5.0000 mg | ORAL_TABLET | Freq: Every day | ORAL | Status: DC

## 2016-01-28 MED ORDER — ESCITALOPRAM OXALATE 10 MG PO TABS: 10.0000 mg | ORAL_TABLET | ORAL | Status: DC

## 2016-01-28 NOTE — Progress Notes (Signed)
Psychiatric Initial Adult Assessment   Patient Identification: Charlotte [name] MRN:  [id number] Date of Evaluation:  01/28/2016 Referral Source: Dr. Legrand Rams Chief Complaint:   Chief Complaint    Anxiety; Follow-up     Visit Diagnosis:    ICD-9-CM ICD-10-CM   1. Generalized anxiety disorder 300.02 F41.1    Diagnosis:   Patient Active Problem List   Diagnosis Date Noted  . Generalized anxiety disorder [F41.1] 01/28/2016  . SHOULDER, ARTHRITIS, DEGEN./OSTEO [M19.019] 12/23/2009  . IMPINGEMENT SYNDROME [M75.80] 12/23/2009  . HIGH BLOOD PRESSURE [Z86.79] 12/23/2009   History of Present Illness:  This patient is an 82 year old married black female who lives with her husband and son in [place]. 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 She has another daughter also in [employer].  The patient was referred by her primary physician, Dr. Legrand Rams, for further evaluation and treatment of anxiety and insomnia.  The patient states that she is enjoyed great health most of her life. The only problem she ever had was hypertension. She's never had any mental illness problems or gone to a psychiatrist or therapist or been hospitalized. Last July she went into the emergency room with generalized weakness and feeling poorly. She was found to have a urinary tract infection compounded by dehydration. At the time she was on HCTZ for hypertension and this was discontinued. A few weeks later she went back to the emergency room with shortness of breath but a medical etiology has not been found.  The patient has been taken off HCTZ and is on several other medicines for hypertension prescribed by her cardiologist Dr. Harl Bowie. He is also ordered cardiac echo and chest x-ray as well as thyroid panel and numerous other labs and nothing has been out of the ordinary.In December the patient developed panic attacks and anxiety. She is not sure why. She states she's always been very  active, participate in church and volunteer activities and she has numerous friends. She has had a couple of cousins died in the last few months which may have contributed to this.  Dr. Legrand Rams put the patient on Lexapro 10 mg daily which is helped anxiety disorder degree. She denies being depressed. However for the last few weeks she's been unable to sleep. Dr. Legrand Rams tried clonazepam at bedtime but it's not helping and she is tapering it off. She tried Ambien for short time and she thinks this did work better. She feels fatigued and tired all the time but with no medical reason. She stopped all her activities and sits around and I think this is contributed to the fatigue. She denies suicidal ideation psychotic symptoms or memory loss. She does not use drugs or alcohol. She has no family history significant for mental illness Elements:  Location:  Global. Quality:  Worsening. Severity:  Moderate. Timing:  Daily. Duration:  Several months. Context:  Recent urinary tract infection. Associated Signs/Symptoms: Depression Symptoms:  insomnia, fatigue, anxiety, panic attacks, loss of energy/fatigue,  Anxiety Symptoms:  Panic Symptoms,   Past Medical History:  Past Medical History  Diagnosis Date  . Hypertension   . Cancer [facility])     breast cancer  . Fatigue   . Anxiety     Past Surgical History  Procedure Laterality Date  . Breast lumpectomy      right side  . Tonsillectomy    . Dilation and curettage of uterus    . Foot surgery     Family  History:  Family History  Problem Relation Age of Onset  . Heart attack Mother   . Cancer Mother    Social History:   Social History   Social History  . Marital Status: Married    Spouse Name: N/A  . Number of Children: N/A  . Years of Education: N/A   Social History Main Topics  . Smoking status: Never Smoker   . Smokeless tobacco: Never Used  . Alcohol Use: No  . Drug Use: No  . Sexual Activity: Not Asked   Other Topics Concern   . None   Social History Narrative   Additional Social History: The patient grew up in [place]. She had a brother and sister but both are deceased. She has one son one daughter 3 grandchildren and 1 great-granddaughter. She states her family is very close and there are no significant problems. She worked as a Nurse, adult for 30 years. She has enjoyed retirement is very active at [employer] and volunteering at [employer]. She is stopped her activities for the last couple of months because she doesn't feel well. She denies any history of trauma or abuse and states she was always "a happy-go-lucky person" until about 2 months ago  Musculoskeletal: Strength & Muscle Tone: within normal limits Gait & Station: normal Patient leans: N/A  Psychiatric Specialty Exam: HPI  Review of Systems  Constitutional: Positive for weight loss and malaise/fatigue.  Psychiatric/Behavioral: The patient is nervous/anxious and has insomnia.     Blood pressure 172/74, pulse 73, height 5\' 3"  (1.6 m), weight 128 lb (58.06 kg).Body mass index is 22.68 kg/(m^2).  General Appearance: Casual, Neat and Well Groomed  Eye Contact:  Good  Speech:  Clear and Coherent  Volume:  Decreased  Mood:  Anxious  Affect:  Constricted  Thought Process:  Goal Directed  Orientation:  Full (Time, Place, and Person)  Thought Content:  Rumination  Suicidal Thoughts:  No  Homicidal Thoughts:  No  Memory:  Immediate;   Good Recent;   Good Remote;   Good  Judgement:  Good  Insight:  Fair  Psychomotor Activity:  Normal  Concentration:  Good  Recall:  Good  Fund of Knowledge:Good  Language: Good  Akathisia:  No  Handed:  Right  AIMS (if indicated):    Assets:  Communication Skills Desire for Improvement Physical Health Resilience Social Support Talents/Skills  ADL's:  Intact  Cognition: WNL  Sleep:  poor   Is the patient at risk to self?  No. Has the patient been a risk to self in the past 6 months?  No. Has  the patient been a risk to self within the distant past?  No. Is the patient a risk to others?  No. Has the patient been a risk to others in the past 6 months?  No. Has the patient been a risk to others within the distant past?  No.  Allergies:   Allergies  Allergen Reactions  . Latex Swelling  . Motrin [Ibuprofen] Other (See Comments)    Stomach pain  . Bextra [Valdecoxib] Other (See Comments)    Stomach Pains   Current Medications: Current Outpatient Prescriptions  Medication Sig Dispense Refill  . acetaminophen (TYLENOL) 500 MG tablet Take 1,000 mg by mouth daily as needed for mild pain. Reported on 01/27/2016    . aspirin EC 81 MG tablet Take 81 mg by mouth daily.    . Calcium Carbonate-Vitamin D (CALCIUM 600+D3 PO) Take 1 tablet by mouth 2 (  two) times daily.    . carbamide peroxide (DEBROX) 6.5 % otic solution Place 1 drop into both ears daily as needed (ear wax buildup).    . clonazePAM (KLONOPIN) 0.5 MG tablet Take 0.25 mg by mouth at bedtime.     . cycloSPORINE (RESTASIS) 0.05 % ophthalmic emulsion Place 1 drop into both eyes 2 (two) times daily.    Marland Kitchen escitalopram (LEXAPRO) 10 MG tablet Take 1 tablet (10 mg total) by mouth every morning. 30 tablet 2  . felodipine (PLENDIL) 5 MG 24 hr tablet Take 10 mg by mouth daily.     . fluticasone (FLONASE) 50 MCG/ACT nasal spray Place 2 sprays into the nose daily as needed for allergies.    . hydrALAZINE (APRESOLINE) 50 MG tablet Take 50 mg by mouth 2 (two) times daily.    . Liniments (SALONPAS PAIN RELIEF PATCH) PADS Apply 1 each topically daily as needed (pain).    Marland Kitchen losartan (COZAAR) 100 MG tablet Take 100 mg by mouth daily.    . Multiple Vitamins-Minerals (CENTRUM ADULTS PO) Take 1 tablet by mouth daily.    . Omega-3 Fatty Acids (FISH OIL TRIPLE STRENGTH) 1400 MG CAPS Take 1 tablet by mouth daily.    Vladimir Faster Glycol-Propyl Glycol (SYSTANE OP) Apply 1 drop to eye 2 (two) times daily.     Marland Kitchen tretinoin (RETIN-A) 0.025 % cream Apply 1  application topically at bedtime.   3  . zolpidem (AMBIEN) 5 MG tablet Take 1 tablet (5 mg total) by mouth at bedtime. 30 tablet 2   No current facility-administered medications for this visit.    Previous Psychotropic Medications: Yes   Substance Abuse History in the last 12 months:  No.  Consequences of Substance Abuse: NA  Medical Decision Making:  Review of Psycho-Social Stressors (1), Review or order clinical lab tests (1), Review and summation of old records (2), Established Problem, Worsening (2), Review of Medication Regimen & Side Effects (2) and Review of New Medication or Change in Dosage (2)  Treatment Plan Summary: Medication management   This patient is a 82 year old female who was enjoyed good health most of her life. She seems to be more anxious and she suffered a UTI last summer and has had changes in medications for hypertension. She has had increased anxiety and insomnia. The Lexapro is helping to some degree in her main concern today is that she cannot sleep. I suggested continuing the Lexapro and taking it only in the morning. She'll retry Ambien 5 mg at bedtime. She was encouraged to resume her normal activities get exercise and fresh air daily. She'll return to see me in 4 weeks    ROSS, [facility] 2/7/20173:22 PM

## 2016-02-04 ENCOUNTER — Telehealth (HOSPITAL_COMMUNITY): Payer: Self-pay | Admitting: *Deleted

## 2016-02-04 NOTE — Telephone Encounter (Signed)
Informed pt of what Dr. Ross stated and she showed understanding 

## 2016-02-04 NOTE — Telephone Encounter (Signed)
patient called, she is taking Lexapro 10 mg and Ambien. It seems like she feels drained. She wants to know if the Lexapro can be decreased from 10 mg. to 5 mg. or if she can take only as needed

## 2016-02-04 NOTE — Telephone Encounter (Signed)
She can decrease it to 5 mg

## 2016-02-04 NOTE — Telephone Encounter (Signed)
patient called, she is taking Lexapro 10 mg and Ambien.   It seems like she feels drained.   She wants to know if the Lexapro can be decreased from 10 mg. to 5 mg. or if she can take only as needed.

## 2016-02-24 ENCOUNTER — Telehealth (HOSPITAL_COMMUNITY): Payer: Self-pay | Admitting: *Deleted

## 2016-02-24 ENCOUNTER — Ambulatory Visit (HOSPITAL_COMMUNITY): Payer: Self-pay | Admitting: Psychiatry

## 2016-02-24 NOTE — Telephone Encounter (Signed)
Called pt to resch appt due to provider being out of office. Pt was to return in 4 wks for medication f/u. Per pt, her Ambien and Lexapro 5 mg is not working. Per pt, she's taking her Ambien in the around 10 pm but it is not helping with her sleep and about 2-3 am she is wide awake. Per pt, she stopped taking it on Friday. Per pt since then, she's been taking the Ambien off and on. Per per, Her Lexapro 5 mg is making her drowsy all day. Per pt, she is taking it in the morning like she was instructed to do. Per pt, one of her friends take magnesium supplement for sleeping and would like to see if Dr. Harrington Challenger recommends this medication for her insomnia. Per pt, if she recommends the magnesium, should she still take the Lexapro? Informed pt provider is out of office until Wednesday and pt stated she can not wait until 02-26-16 until provider comes back. Per pt, she need something to help her sleep. Per pt she just need a sleep aid due to not been sleeping and the magnesium is not a priority. Pt number is [phone].

## 2016-02-26 ENCOUNTER — Encounter (HOSPITAL_COMMUNITY): Payer: Self-pay | Admitting: *Deleted

## 2016-02-26 ENCOUNTER — Other Ambulatory Visit (HOSPITAL_COMMUNITY): Payer: Self-pay | Admitting: Psychiatry

## 2016-02-26 MED ORDER — TEMAZEPAM 7.5 MG PO CAPS: 7.5000 mg | ORAL_CAPSULE | Freq: Every evening | ORAL | Status: DC | PRN

## 2016-02-26 NOTE — Telephone Encounter (Signed)
Called pt and informed her printed medication is ready for pick up

## 2016-02-26 NOTE — Telephone Encounter (Signed)
Pt came into office to pick up printed script and showed understanding

## 2016-02-26 NOTE — Telephone Encounter (Signed)
I know NOTHING about magnesium. She should continue the lexapro. I will print restoril

## 2016-02-26 NOTE — Progress Notes (Signed)
Pt came into office to pick up printed script for her Restoril. Pt D/L number is [phone] expiration date of 07-20-19.

## 2016-03-09 ENCOUNTER — Ambulatory Visit (INDEPENDENT_AMBULATORY_CARE_PROVIDER_SITE_OTHER): Payer: Medicare Other | Admitting: Psychiatry

## 2016-03-09 ENCOUNTER — Encounter (HOSPITAL_COMMUNITY): Payer: Self-pay | Admitting: Psychiatry

## 2016-03-09 VITALS — BP 181/83 | HR 75 | Ht 63.0 in | Wt 127.0 lb

## 2016-03-09 DIAGNOSIS — F411 Generalized anxiety disorder: Secondary | ICD-10-CM | POA: Diagnosis not present

## 2016-03-09 MED ORDER — TEMAZEPAM 7.5 MG PO CAPS: 7.5000 mg | ORAL_CAPSULE | Freq: Every evening | ORAL | Status: DC | PRN

## 2016-03-09 NOTE — Progress Notes (Signed)
Patient ID: Charlotte [name], female   DOB: December 06, 1928, 82 y.o.   MRN: [id number]  Psychiatric Initial Adult Assessment   Patient Identification: Charlotte [name] MRN:  [id number] Date of Evaluation:  03/09/2016 Referral Source: Dr. Legrand Rams Chief Complaint:   Chief Complaint    Anxiety; Follow-up     Visit Diagnosis:    ICD-9-CM ICD-10-CM   1. Generalized anxiety disorder 300.02 F41.1    Diagnosis:   Patient Active Problem List   Diagnosis Date Noted  . Generalized anxiety disorder [F41.1] 01/28/2016  . SHOULDER, ARTHRITIS, DEGEN./OSTEO [M19.019] 12/23/2009  . IMPINGEMENT SYNDROME [M75.80] 12/23/2009  . HIGH BLOOD PRESSURE [Z86.79] 12/23/2009   History of Present Illness:  This patient is an 82 year old married black female who lives with her husband and son in [place]. 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 She has another daughter also in [place].  The patient was referred by her primary physician, Dr. Legrand Rams, for further evaluation and treatment of anxiety and insomnia.  The patient states that she is enjoyed great health most of her life. The only problem she ever had was hypertension. She's never had any mental illness problems or gone to a psychiatrist or therapist or been hospitalized. Last July she went into the emergency room with generalized weakness and feeling poorly. She was found to have a urinary tract infection compounded by dehydration. At the time she was on HCTZ for hypertension and this was discontinued. A few weeks later she went back to the emergency room with shortness of breath but a medical etiology has not been found.  The patient has been taken off HCTZ and is on several other medicines for hypertension prescribed by her cardiologist Dr. Harl Bowie. He is also ordered cardiac echo and chest x-ray as well as thyroid panel and numerous other labs and nothing has been out of the ordinary.In December the patient developed  panic attacks and anxiety. She is not sure why. She states she's always been very active, participate in church and volunteer activities and she has numerous friends. She has had a couple of cousins died in the last few months which may have contributed to this.  Dr. Legrand Rams put the patient on Lexapro 10 mg daily which is helped anxietyto some degree. She denies being depressed. However for the last few weeks she's been unable to sleep. Dr. Legrand Rams tried clonazepam at bedtime but it's not helping and she is tapering it off. She tried Ambien for short time and she thinks this did work better. She feels fatigued and tired all the time but with no medical reason. She stopped all her activities and sits around and I think this is contributed to the fatigue. She denies suicidal ideation psychotic symptoms or memory loss. She does not use drugs or alcohol. She has no family history significant for mental illness  The patient returns after 4 weeks. She called a couple of weeks ago stating that she could not sleep with the Ambien so I switched her to Restoril 7.5 mg and this is helping her sleep 7-8 hours a night. She also cut down the Lexapro to 5 mg because she thought it was making her feel bad. Even on the 5 mg she feels nauseated tired and jittery after she takes it. I suggested that she stop it and we can see how she does without it and she is agreeable. She denies being depressed and she starting to get out of  the house more. She also says she has nothing right now to make her anxious and her general health is good Elements:  Location:  Global. Quality:  Worsening. Severity:  Moderate. Timing:  Daily. Duration:  Several months. Context:  Recent urinary tract infection. Associated Signs/Symptoms: Depression Symptoms:  insomnia, fatigue, anxiety, panic attacks, loss of energy/fatigue,  Anxiety Symptoms:  Panic Symptoms,   Past Medical History:  Past Medical History  Diagnosis Date  . Hypertension    . Cancer [facility])     breast cancer  . Fatigue   . Anxiety     Past Surgical History  Procedure Laterality Date  . Breast lumpectomy      right side  . Tonsillectomy    . Dilation and curettage of uterus    . Foot surgery     Family History:  Family History  Problem Relation Age of Onset  . Heart attack Mother   . Cancer Mother    Social History:   Social History   Social History  . Marital Status: Married    Spouse Name: N/A  . Number of Children: N/A  . Years of Education: N/A   Social History Main Topics  . Smoking status: Never Smoker   . Smokeless tobacco: Never Used  . Alcohol Use: No  . Drug Use: No  . Sexual Activity: Not Asked   Other Topics Concern  . None   Social History Narrative   Additional Social History: The patient grew up in [place]. She had a brother and sister but both are deceased. She has one son one daughter 3 grandchildren and 1 great-granddaughter. She states her family is very close and there are no significant problems. She worked as a Nurse, adult for 30 years. She has enjoyed retirement is very active at [employer] and volunteering at [employer]. She is stopped her activities for the last couple of months because she doesn't feel well. She denies any history of trauma or abuse and states she was always "a happy-go-lucky person" until about 2 months ago  Musculoskeletal: Strength & Muscle Tone: within normal limits Gait & Station: normal Patient leans: N/A  Psychiatric Specialty Exam: Anxiety Symptoms include insomnia and nervous/anxious behavior.      Review of Systems  Constitutional: Positive for weight loss and malaise/fatigue.  Psychiatric/Behavioral: The patient is nervous/anxious and has insomnia.     Blood pressure 181/83, pulse 75, height 5\' 3"  (1.6 m), weight 127 lb (57.607 kg), SpO2 96 %.Body mass index is 22.5 kg/(m^2).  General Appearance: Casual, Neat and Well Groomed  Eye Contact:  Good  Speech:  Clear and  Coherent  Volume:  Decreased  Mood:  Anxious  Affect: Brighter   Thought Process:  Goal Directed  Orientation:  Full (Time, Place, and Person)  Thought Content:  Rumination  Suicidal Thoughts:  No  Homicidal Thoughts:  No  Memory:  Immediate;   Good Recent;   Good Remote;   Good  Judgement:  Good  Insight:  Fair  Psychomotor Activity:  Normal  Concentration:  Good  Recall:  Good  Fund of Knowledge:Good  Language: Good  Akathisia:  No  Handed:  Right  AIMS (if indicated):    Assets:  Communication Skills Desire for Improvement Physical Health Resilience Social Support Talents/Skills  ADL's:  Intact  Cognition: WNL  Sleep:  poor   Is the patient at risk to self?  No. Has the patient been a risk to self in the past 6  months?  No. Has the patient been a risk to self within the distant past?  No. Is the patient a risk to others?  No. Has the patient been a risk to others in the past 6 months?  No. Has the patient been a risk to others within the distant past?  No.  Allergies:   Allergies  Allergen Reactions  . Latex Swelling  . Motrin [Ibuprofen] Other (See Comments)    Stomach pain  . Bextra [Valdecoxib] Other (See Comments)    Stomach Pains   Current Medications: Current Outpatient Prescriptions  Medication Sig Dispense Refill  . acetaminophen (TYLENOL) 500 MG tablet Take 1,000 mg by mouth daily as needed for mild pain. Reported on 01/27/2016    . aspirin EC 81 MG tablet Take 81 mg by mouth daily.    . Calcium Carbonate-Vitamin D (CALCIUM 600+D3 PO) Take 1 tablet by mouth 2 (two) times daily.    . carbamide peroxide (DEBROX) 6.5 % otic solution Place 1 drop into both ears daily as needed (ear wax buildup).    . cycloSPORINE (RESTASIS) 0.05 % ophthalmic emulsion Place 1 drop into both eyes 2 (two) times daily.    . felodipine (PLENDIL) 5 MG 24 hr tablet Take 5 mg by mouth daily.     . fluticasone (FLONASE) 50 MCG/ACT nasal spray Place 2 sprays into the nose daily as  needed for allergies.    . hydrALAZINE (APRESOLINE) 50 MG tablet Take 50 mg by mouth 2 (two) times daily.    . Liniments (SALONPAS PAIN RELIEF PATCH) PADS Apply 1 each topically daily as needed (pain).    Marland Kitchen losartan (COZAAR) 100 MG tablet Take 100 mg by mouth daily.    . Multiple Vitamins-Minerals (CENTRUM ADULTS PO) Take 1 tablet by mouth daily.    . Omega-3 Fatty Acids (FISH OIL TRIPLE STRENGTH) 1400 MG CAPS Take 1 tablet by mouth daily.    Vladimir Faster Glycol-Propyl Glycol (SYSTANE OP) Apply 1 drop to eye 2 (two) times daily.     . temazepam (RESTORIL) 7.5 MG capsule Take 1 capsule (7.5 mg total) by mouth at bedtime as needed for sleep. 30 capsule 2  . tretinoin (RETIN-A) 0.025 % cream Apply 1 application topically at bedtime.   3   No current facility-administered medications for this visit.    Previous Psychotropic Medications: Yes   Substance Abuse History in the last 12 months:  No.  Consequences of Substance Abuse: NA  Medical Decision Making:  Review of Psycho-Social Stressors (1), Review or order clinical lab tests (1), Review and summation of old records (2), Established Problem, Worsening (2), Review of Medication Regimen & Side Effects (2) and Review of New Medication or Change in Dosage (2)  Treatment Plan Summary: Medication management   The patient will continue Restoril 7.5 mg at bedtime for sleep. She will discontinue Lexapro due to side effects. She'll return to see me in 6 weeks but call sooner if depressive or anxiety symptoms reemerge    Beckam Abdulaziz, [place] 3/20/20171:45 PM

## 2016-03-16 ENCOUNTER — Telehealth (HOSPITAL_COMMUNITY): Payer: Self-pay | Admitting: *Deleted

## 2016-03-16 NOTE — Telephone Encounter (Signed)
Please find out why she stopped lexapro

## 2016-03-16 NOTE — Telephone Encounter (Signed)
Pt called stating she do not know if it's the Lexapro or her new sleeping medications Restoril but she feels like she is having withdraw symptoms. Per pt, she is tired and no energy and just laying around all day. Per pt she stopped the Lexapro about a week ago. Pt number is [phone].

## 2016-03-17 NOTE — Telephone Encounter (Signed)
noted 

## 2016-03-17 NOTE — Telephone Encounter (Signed)
Spoke to pt. She may be tired from restoril,advised she try melatonin instead

## 2016-03-17 NOTE — Telephone Encounter (Signed)
Spoke with pt and per pt, she stopped her Lexapro because during her recent visit with provider, she got the ok to do so. Per pt, she do not want to go back on Lexapro. Pt number is [phone].

## 2016-04-13 DIAGNOSIS — N390 Urinary tract infection, site not specified: Secondary | ICD-10-CM | POA: Diagnosis not present

## 2016-04-13 DIAGNOSIS — R8290 Unspecified abnormal findings in urine: Secondary | ICD-10-CM | POA: Diagnosis not present

## 2016-04-13 DIAGNOSIS — I10 Essential (primary) hypertension: Secondary | ICD-10-CM | POA: Diagnosis not present

## 2016-04-13 DIAGNOSIS — G4700 Insomnia, unspecified: Secondary | ICD-10-CM | POA: Diagnosis not present

## 2016-04-20 ENCOUNTER — Ambulatory Visit (INDEPENDENT_AMBULATORY_CARE_PROVIDER_SITE_OTHER): Payer: Medicare Other | Admitting: Psychiatry

## 2016-04-20 ENCOUNTER — Encounter (HOSPITAL_COMMUNITY): Payer: Self-pay | Admitting: Psychiatry

## 2016-04-20 VITALS — BP 155/72 | HR 73 | Ht 63.0 in | Wt 127.0 lb

## 2016-04-20 DIAGNOSIS — F411 Generalized anxiety disorder: Secondary | ICD-10-CM

## 2016-04-20 NOTE — Progress Notes (Signed)
Patient ID: Charlotte [name], female   DOB: 11/12/1928, 82 y.o.   MRN: [id number] Patient ID: Charlotte [name], female   DOB: January 02, 1928, 82 y.o.   MRN: [id number]  Psychiatric Initial Adult Assessment   Patient Identification: Charlotte [name] MRN:  [id number] Date of Evaluation:  04/20/2016 Referral Source: Dr. Legrand Rams Chief Complaint:   Chief Complaint    Anxiety; Follow-up     Visit Diagnosis:    ICD-9-CM ICD-10-CM   1. Generalized anxiety disorder 300.02 F41.1    Diagnosis:   Patient Active Problem List   Diagnosis Date Noted  . Generalized anxiety disorder [F41.1] 01/28/2016  . SHOULDER, ARTHRITIS, DEGEN./OSTEO [M19.019] 12/23/2009  . IMPINGEMENT SYNDROME [M75.80] 12/23/2009  . HIGH BLOOD PRESSURE [Z86.79] 12/23/2009   History of Present Illness:  This patient is an 82 year old married black female who lives with her husband and son in [place]. 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 She has another daughter also in [place].  The patient was referred by her primary physician, Dr. Legrand Rams, for further evaluation and treatment of anxiety and insomnia.  The patient states that she is enjoyed great health most of her life. The only problem she ever had was hypertension. She's never had any mental illness problems or gone to a psychiatrist or therapist or been hospitalized. Last July she went into the emergency room with generalized weakness and feeling poorly. She was found to have a urinary tract infection compounded by dehydration. At the time she was on HCTZ for hypertension and this was discontinued. A few weeks later she went back to the emergency room with shortness of breath but a medical etiology has not been found.  The patient has been taken off HCTZ and is on several other medicines for hypertension prescribed by her cardiologist Dr. Harl Bowie. He is also ordered cardiac echo and chest x-ray as well as thyroid panel and numerous other labs  and nothing has been out of the ordinary.In December the patient developed panic attacks and anxiety. She is not sure why. She states she's always been very active, participate in church and volunteer activities and she has numerous friends. She has had a couple of cousins died in the last few months which may have contributed to this.  Dr. Legrand Rams put the patient on Lexapro 10 mg daily which is helped anxietyto some degree. She denies being depressed. However for the last few weeks she's been unable to sleep. Dr. Legrand Rams tried clonazepam at bedtime but it's not helping and she is tapering it off. She tried Ambien for short time and she thinks this did work better. She feels fatigued and tired all the time but with no medical reason. She stopped all her activities and sits around and I think this is contributed to the fatigue. She denies suicidal ideation psychotic symptoms or memory loss. She does not use drugs or alcohol. She has no family history significant for mental illness  The patient returns after 6 weeks. She called a couple weeks ago and stated that she stopped the Lexapro because it made her feel very tired. The Restoril did the same and she had stopped this and is only using melatonin. Currently she is on no psychiatric medications. She had a urinary tract infection last week but her physician caught it in time and she was on Cipro and is is cleared up. She claims that "this drained me". However as long as her health is good  she can get out and do things and she feels like she is getting back to her self. She really isn't interested in trying any more psychiatric medicines so I stated we would let her only come back to see me if needed Elements:  Location:  Global. Quality:  Worsening. Severity:  Moderate. Timing:  Daily. Duration:  Several months. Context:  Recent urinary tract infection. Associated Signs/Symptoms: Depression Symptoms:  insomnia, fatigue, anxiety, panic attacks, loss of  energy/fatigue,  Anxiety Symptoms:  Panic Symptoms,   Past Medical History:  Past Medical History  Diagnosis Date  . Hypertension   . Cancer [facility])     breast cancer  . Fatigue   . Anxiety     Past Surgical History  Procedure Laterality Date  . Breast lumpectomy      right side  . Tonsillectomy    . Dilation and curettage of uterus    . Foot surgery     Family History:  Family History  Problem Relation Age of Onset  . Heart attack Mother   . Cancer Mother    Social History:   Social History   Social History  . Marital Status: Married    Spouse Name: N/A  . Number of Children: N/A  . Years of Education: N/A   Social History Main Topics  . Smoking status: Never Smoker   . Smokeless tobacco: Never Used  . Alcohol Use: No  . Drug Use: No  . Sexual Activity: Not Asked   Other Topics Concern  . None   Social History Narrative   Additional Social History: The patient grew up in [place]. She had a brother and sister but both are deceased. She has one son one daughter 3 grandchildren and 1 great-granddaughter. She states her family is very close and there are no significant problems. She worked as a Nurse, adult for 30 years. She has enjoyed retirement is very active at [employer] and volunteering at [employer]. She is stopped her activities for the last couple of months because she doesn't feel well. She denies any history of trauma or abuse and states she was always "a happy-go-lucky person" until about 2 months ago  Musculoskeletal: Strength & Muscle Tone: within normal limits Gait & Station: normal Patient leans: N/A  Psychiatric Specialty Exam: Anxiety Symptoms include insomnia and nervous/anxious behavior.      Review of Systems  Constitutional: Positive for weight loss and malaise/fatigue.  Psychiatric/Behavioral: The patient is nervous/anxious and has insomnia.     Blood pressure 155/72, pulse 73, height 5\' 3"  (1.6 m), weight 127 lb (57.607  kg).Body mass index is 22.5 kg/(m^2).  General Appearance: Casual, Neat and Well Groomed  Eye Contact:  Good  Speech:  Clear and Coherent  Volume:  Decreased  Mood: A little anxious   Affect: Brighter   Thought Process:  Goal Directed  Orientation:  Full (Time, Place, and Person)  Thought Content:  Rumination  Suicidal Thoughts:  No  Homicidal Thoughts:  No  Memory:  Immediate;   Good Recent;   Good Remote;   Good  Judgement:  Good  Insight:  Fair  Psychomotor Activity:  Normal  Concentration:  Good  Recall:  Good  Fund of Knowledge:Good  Language: Good  Akathisia:  No  Handed:  Right  AIMS (if indicated):    Assets:  Communication Skills Desire for Improvement Physical Health Resilience Social Support Talents/Skills  ADL's:  Intact  Cognition: WNL  Sleep:  poor  Is the patient at risk to self?  No. Has the patient been a risk to self in the past 6 months?  No. Has the patient been a risk to self within the distant past?  No. Is the patient a risk to others?  No. Has the patient been a risk to others in the past 6 months?  No. Has the patient been a risk to others within the distant past?  No.  Allergies:   Allergies  Allergen Reactions  . Latex Swelling  . Motrin [Ibuprofen] Other (See Comments)    Stomach pain  . Bextra [Valdecoxib] Other (See Comments)    Stomach Pains   Current Medications: Current Outpatient Prescriptions  Medication Sig Dispense Refill  . acetaminophen (TYLENOL) 500 MG tablet Take 1,000 mg by mouth daily as needed for mild pain. Reported on 01/27/2016    . aspirin EC 81 MG tablet Take 81 mg by mouth daily.    . Calcium Carbonate-Vitamin D (CALCIUM 600+D3 PO) Take 1 tablet by mouth 2 (two) times daily.    . carbamide peroxide (DEBROX) 6.5 % otic solution Place 1 drop into both ears daily as needed (ear wax buildup).    . cycloSPORINE (RESTASIS) 0.05 % ophthalmic emulsion Place 1 drop into both eyes 2 (two) times daily.    . felodipine  (PLENDIL) 5 MG 24 hr tablet Take 5 mg by mouth daily.     . fluticasone (FLONASE) 50 MCG/ACT nasal spray Place 2 sprays into the nose daily as needed for allergies.    . hydrALAZINE (APRESOLINE) 50 MG tablet Take 50 mg by mouth 2 (two) times daily.    . Liniments (SALONPAS PAIN RELIEF PATCH) PADS Apply 1 each topically daily as needed (pain).    Marland Kitchen losartan (COZAAR) 100 MG tablet Take 100 mg by mouth daily.    . Melatonin 5 MG TABS Take 5 mg by mouth daily.    . Multiple Vitamins-Minerals (CENTRUM ADULTS PO) Take 1 tablet by mouth daily.    . Omega-3 Fatty Acids (FISH OIL TRIPLE STRENGTH) 1400 MG CAPS Take 1 tablet by mouth daily.    Vladimir Faster Glycol-Propyl Glycol (SYSTANE OP) Apply 1 drop to eye 2 (two) times daily.     Marland Kitchen tretinoin (RETIN-A) 0.025 % cream Apply 1 application topically at bedtime.   3   No current facility-administered medications for this visit.    Previous Psychotropic Medications: Yes   Substance Abuse History in the last 12 months:  No.  Consequences of Substance Abuse: NA  Medical Decision Making:  Review of Psycho-Social Stressors (1), Review or order clinical lab tests (1), Review and summation of old records (2), Established Problem, Worsening (2), Review of Medication Regimen & Side Effects (2) and Review of New Medication or Change in Dosage (2)  Treatment Plan Summary: Medication management   The patient will continue All the time and 5 mg at bedtime which she can purchase without a prescription. As she is currently on no psychiatric medicines she will come back only as needed    ROSS, DEBORAH 5/1/20171:53 PM

## 2016-04-22 DIAGNOSIS — H6123 Impacted cerumen, bilateral: Secondary | ICD-10-CM | POA: Diagnosis not present

## 2016-04-23 ENCOUNTER — Telehealth: Payer: Self-pay | Admitting: Cardiology

## 2016-04-23 DIAGNOSIS — R8290 Unspecified abnormal findings in urine: Secondary | ICD-10-CM | POA: Diagnosis not present

## 2016-04-23 DIAGNOSIS — N390 Urinary tract infection, site not specified: Secondary | ICD-10-CM | POA: Diagnosis not present

## 2016-04-23 MED ORDER — HYDRALAZINE HCL 25 MG PO TABS
37.5000 mg | ORAL_TABLET | Freq: Two times a day (BID) | ORAL | Status: DC
Start: 2016-04-23 — End: 2016-06-01

## 2016-04-23 NOTE — Telephone Encounter (Signed)
Called pt, made her aware of the dose change and to make sure she watched her bp. Let us know if the medication works. Let her know if the 37.5 mg bid didn't work she could go back on 25 mg bid.

## 2016-04-23 NOTE — Telephone Encounter (Signed)
Called pt back to see what problems she was having. She stated that her bp was going down a little too low for her as she is getting dizzy at times and is feeling very tired. She stated that it has been as low as 115/50, 115/59, 110/54, 110/57. She was wondering if she could go back to taking only 25 mg of her hydralazine bid as she has noticed that when she takes 50 mg bid is when she notices the bp going  too low. Please advise.

## 2016-04-23 NOTE — Telephone Encounter (Signed)
Those are all good blood pressures. If she is feeling bad though would try hydralazine 37.5mg  bid, if still feels bad then can go back to 25mg  bid though her bp's were not controlled at that dose  Zandra Abts MD

## 2016-04-23 NOTE — Telephone Encounter (Signed)
Patient has concerns regarding her BP readings post new medication / tg

## 2016-05-19 ENCOUNTER — Ambulatory Visit (HOSPITAL_COMMUNITY)
Admission: RE | Admit: 2016-05-19 | Discharge: 2016-05-19 | Disposition: A | Discharge status: Home / Self Care | Payer: Medicare Other | Source: Ambulatory Visit | Attending: Internal Medicine | Admitting: Internal Medicine

## 2016-05-19 ENCOUNTER — Other Ambulatory Visit (HOSPITAL_COMMUNITY): Payer: Self-pay | Admitting: Internal Medicine

## 2016-05-19 DIAGNOSIS — I10 Essential (primary) hypertension: Secondary | ICD-10-CM | POA: Diagnosis not present

## 2016-05-19 DIAGNOSIS — R634 Abnormal weight loss: Secondary | ICD-10-CM | POA: Diagnosis not present

## 2016-05-19 DIAGNOSIS — M1990 Unspecified osteoarthritis, unspecified site: Secondary | ICD-10-CM | POA: Diagnosis not present

## 2016-05-19 DIAGNOSIS — M609 Myositis, unspecified: Secondary | ICD-10-CM | POA: Diagnosis not present

## 2016-05-19 DIAGNOSIS — R911 Solitary pulmonary nodule: Secondary | ICD-10-CM | POA: Insufficient documentation

## 2016-05-19 DIAGNOSIS — G4700 Insomnia, unspecified: Secondary | ICD-10-CM | POA: Diagnosis not present

## 2016-05-21 ENCOUNTER — Other Ambulatory Visit (HOSPITAL_COMMUNITY): Payer: Self-pay | Admitting: Internal Medicine

## 2016-05-21 DIAGNOSIS — R9389 Abnormal findings on diagnostic imaging of other specified body structures: Secondary | ICD-10-CM

## 2016-05-21 DIAGNOSIS — R634 Abnormal weight loss: Secondary | ICD-10-CM

## 2016-05-25 ENCOUNTER — Other Ambulatory Visit (HOSPITAL_COMMUNITY): Payer: Self-pay | Admitting: Internal Medicine

## 2016-05-25 ENCOUNTER — Ambulatory Visit (HOSPITAL_COMMUNITY)
Admission: RE | Admit: 2016-05-25 | Discharge: 2016-05-25 | Disposition: A | Discharge status: Home / Self Care | Payer: Medicare Other | Source: Ambulatory Visit | Attending: Internal Medicine | Admitting: Internal Medicine

## 2016-05-25 DIAGNOSIS — R634 Abnormal weight loss: Secondary | ICD-10-CM | POA: Diagnosis not present

## 2016-05-25 DIAGNOSIS — R9389 Abnormal findings on diagnostic imaging of other specified body structures: Secondary | ICD-10-CM

## 2016-05-25 DIAGNOSIS — N63 Unspecified lump in breast: Secondary | ICD-10-CM | POA: Diagnosis not present

## 2016-05-25 DIAGNOSIS — R918 Other nonspecific abnormal finding of lung field: Secondary | ICD-10-CM

## 2016-05-25 DIAGNOSIS — IMO0002 Reserved for concepts with insufficient information to code with codable children: Secondary | ICD-10-CM

## 2016-05-25 DIAGNOSIS — I2510 Atherosclerotic heart disease of native coronary artery without angina pectoris: Secondary | ICD-10-CM | POA: Insufficient documentation

## 2016-05-25 DIAGNOSIS — R229 Localized swelling, mass and lump, unspecified: Secondary | ICD-10-CM

## 2016-05-25 DIAGNOSIS — R911 Solitary pulmonary nodule: Secondary | ICD-10-CM | POA: Diagnosis not present

## 2016-05-25 DIAGNOSIS — R938 Abnormal findings on diagnostic imaging of other specified body structures: Secondary | ICD-10-CM | POA: Diagnosis not present

## 2016-05-25 DIAGNOSIS — I700 Atherosclerosis of aorta: Secondary | ICD-10-CM | POA: Diagnosis not present

## 2016-05-26 ENCOUNTER — Other Ambulatory Visit (HOSPITAL_COMMUNITY): Payer: Self-pay | Admitting: Internal Medicine

## 2016-05-26 ENCOUNTER — Ambulatory Visit (HOSPITAL_COMMUNITY)
Admission: RE | Admit: 2016-05-26 | Discharge: 2016-05-26 | Disposition: A | Discharge status: Home / Self Care | Payer: Medicare Other | Source: Ambulatory Visit | Attending: Internal Medicine | Admitting: Internal Medicine

## 2016-05-26 DIAGNOSIS — IMO0002 Reserved for concepts with insufficient information to code with codable children: Secondary | ICD-10-CM

## 2016-05-26 DIAGNOSIS — N63 Unspecified lump in breast: Secondary | ICD-10-CM | POA: Insufficient documentation

## 2016-05-26 DIAGNOSIS — R229 Localized swelling, mass and lump, unspecified: Principal | ICD-10-CM

## 2016-05-28 DIAGNOSIS — N63 Unspecified lump in breast: Secondary | ICD-10-CM | POA: Diagnosis not present

## 2016-05-28 DIAGNOSIS — I10 Essential (primary) hypertension: Secondary | ICD-10-CM | POA: Diagnosis not present

## 2016-05-28 DIAGNOSIS — G479 Sleep disorder, unspecified: Secondary | ICD-10-CM | POA: Diagnosis not present

## 2016-05-28 DIAGNOSIS — R634 Abnormal weight loss: Secondary | ICD-10-CM | POA: Diagnosis not present

## 2016-06-01 ENCOUNTER — Encounter: Payer: Self-pay | Admitting: Adult Health

## 2016-06-01 ENCOUNTER — Ambulatory Visit (INDEPENDENT_AMBULATORY_CARE_PROVIDER_SITE_OTHER): Payer: Medicare Other | Admitting: Adult Health

## 2016-06-01 VITALS — BP 134/72 | HR 75 | Ht 63.0 in | Wt 121.0 lb

## 2016-06-01 DIAGNOSIS — I10 Essential (primary) hypertension: Secondary | ICD-10-CM

## 2016-06-01 MED ORDER — HYDRALAZINE HCL 50 MG PO TABS: 50.0000 mg | ORAL_TABLET | Freq: Two times a day (BID) | ORAL | Status: DC

## 2016-06-01 NOTE — Progress Notes (Deleted)
Name: Charlotte [name]    DOB: 02/27/1928  Age: 82 y.o.  MR#: [id number]       PCP:  Rosita Fire, MD      Insurance: Payor: MEDICARE / Plan: MEDICARE PART A AND B / Product Type: *No Product type* /   CC:    Chief Complaint  Patient presents with  . Hypertension    VS Filed Vitals:   06/01/16 1407  BP: 134/72  Pulse: 75  Height: 5\' 3"  (1.6 m)  Weight: 121 lb (54.885 kg)  SpO2: 97%    Weights Current Weight  06/01/16 121 lb (54.885 kg)  04/20/16 127 lb (57.607 kg)  03/09/16 127 lb (57.607 kg)    Blood Pressure  BP Readings from Last 3 Encounters:  06/01/16 134/72  04/20/16 155/72  03/09/16 181/83     Admit date:  (Not on file) Last encounter with RMR:  Visit date not found   Allergy Latex; Motrin; and Bextra  Current Outpatient Prescriptions  Medication Sig Dispense Refill  . acetaminophen (TYLENOL) 500 MG tablet Take 1,000 mg by mouth daily as needed for mild pain. Reported on 01/27/2016    . aspirin EC 81 MG tablet Take 81 mg by mouth daily.    . Calcium Carbonate-Vitamin D (CALCIUM 600+D3 PO) Take 1 tablet by mouth 2 (two) times daily.    . carbamide peroxide (DEBROX) 6.5 % otic solution Place 1 drop into both ears daily as needed (ear wax buildup).    . cycloSPORINE (RESTASIS) 0.05 % ophthalmic emulsion Place 1 drop into both eyes 2 (two) times daily.    . felodipine (PLENDIL) 5 MG 24 hr tablet Take 5 mg by mouth daily.     . fluticasone (FLONASE) 50 MCG/ACT nasal spray Place 2 sprays into the nose daily as needed for allergies.    . hydrALAZINE (APRESOLINE) 50 MG tablet Take 50 mg by mouth 2 (two) times daily.    . Liniments (SALONPAS PAIN RELIEF PATCH) PADS Apply 1 each topically daily as needed (pain).    Marland Kitchen losartan (COZAAR) 100 MG tablet Take 100 mg by mouth daily.    . Melatonin 5 MG TABS Take 5 mg by mouth daily.    . Multiple Vitamins-Minerals (CENTRUM ADULTS PO) Take 1 tablet by mouth daily.    . Omega-3 Fatty Acids (FISH OIL TRIPLE STRENGTH) 1400 MG CAPS  Take 1 tablet by mouth daily.    Vladimir Faster Glycol-Propyl Glycol (SYSTANE OP) Apply 1 drop to eye 2 (two) times daily.     . temazepam (RESTORIL) 7.5 MG capsule Take 7.5 mg by mouth at bedtime as needed for sleep.    . TOVIAZ 4 MG TB24 tablet Take 4 mg by mouth daily.  2  . tretinoin (RETIN-A) 0.025 % cream Apply 1 application topically at bedtime.   3   No current facility-administered medications for this visit.    Discontinued Meds:    Medications Discontinued During This Encounter  Medication Reason  . hydrALAZINE (APRESOLINE) 25 MG tablet Error    Patient Active Problem List   Diagnosis Date Noted  . Generalized anxiety disorder 01/28/2016  . SHOULDER, ARTHRITIS, DEGEN./OSTEO 12/23/2009  . IMPINGEMENT SYNDROME 12/23/2009  . HIGH BLOOD PRESSURE 12/23/2009    LABS    Component Value Date/Time   NA 132* 12/03/2015 0610   NA 132* 11/04/2015 1100   NA 133* 10/30/2015 0938   K 3.0* 12/03/2015 0610   K 3.5 11/04/2015 1100   K 3.4* 10/30/2015 [id number]  CL 95* 12/03/2015 0610   CL 93* 11/04/2015 1100   CL 96* 10/30/2015 0938   CO2 26 12/03/2015 0610   CO2 27 11/04/2015 1100   CO2 28 10/30/2015 0938   GLUCOSE 110* 12/03/2015 0610   GLUCOSE 137* 11/04/2015 1100   GLUCOSE 122* 10/30/2015 0938   BUN 18 12/03/2015 0610   BUN 16 11/04/2015 1100   BUN 11 10/30/2015 0938   CREATININE 0.92 12/03/2015 0610   CREATININE 1.05* 11/04/2015 1100   CREATININE 0.80 10/30/2015 0938   CREATININE 0.90* 12/27/2014 1202   CALCIUM 9.6 12/03/2015 0610   CALCIUM 9.7 11/04/2015 1100   CALCIUM 9.3 10/30/2015 0938   GFRNONAA 55* 12/03/2015 0610   GFRNONAA 47* 11/04/2015 1100   GFRNONAA >60 10/30/2015 0938   GFRAA >60 12/03/2015 0610   GFRAA 55* 11/04/2015 1100   GFRAA >60 10/30/2015 0938   CMP     Component Value Date/Time   NA 132* 12/03/2015 0610   K 3.0* 12/03/2015 0610   CL 95* 12/03/2015 0610   CO2 26 12/03/2015 0610   GLUCOSE 110* 12/03/2015 0610   BUN 18 12/03/2015 0610    CREATININE 0.92 12/03/2015 0610   CREATININE 0.90* 12/27/2014 1202   CALCIUM 9.6 12/03/2015 0610   PROT 7.2 11/04/2015 1100   ALBUMIN 4.1 11/04/2015 1100   AST 27 11/04/2015 1100   ALT 34 11/04/2015 1100   ALKPHOS 55 11/04/2015 1100   BILITOT 0.6 11/04/2015 1100   GFRNONAA 55* 12/03/2015 0610   GFRAA >60 12/03/2015 0610       Component Value Date/Time   WBC 5.5 12/03/2015 0610   WBC 5.5 11/04/2015 1100   WBC 4.1 10/30/2015 0938   HGB 12.0 12/03/2015 0610   HGB 13.2 11/04/2015 1100   HGB 12.5 10/30/2015 0938   HCT 35.3* 12/03/2015 0610   HCT 38.9 11/04/2015 1100   HCT 37.3 10/30/2015 0938   MCV 86.7 12/03/2015 0610   MCV 86.6 11/04/2015 1100   MCV 87.1 10/30/2015 0938    Lipid Panel  No results found for: CHOL, TRIG, HDL, CHOLHDL, VLDL, LDLCALC, LDLDIRECT  ABG    Component Value Date/Time   PHART 7.649* 11/04/2015 1110   PCO2ART 22.8* 11/04/2015 1110   PO2ART 117* 11/04/2015 1110   HCO3 29.5* 11/04/2015 1110   O2SAT 99.0 11/04/2015 1110     Lab Results  Component Value Date   TSH 1.269 08/30/2015   BNP (last 3 results)  Recent Labs  10/11/15 1630  BNP 17.0    ProBNP (last 3 results) No results for input(s): PROBNP in the last 8760 hours.  Cardiac Panel (last 3 results) No results for input(s): CKTOTAL, CKMB, TROPONINI, RELINDX in the last 72 hours.  Iron/TIBC/Ferritin/ %Sat No results found for: IRON, TIBC, FERRITIN, IRONPCTSAT   EKG Orders placed or performed during the hospital encounter of 12/03/15  . ED EKG  . ED EKG  . EKG 12-Lead  . EKG 12-Lead  . EKG     Prior Assessment and Plan Problem List as of 06/01/2016      Musculoskeletal and Integument   SHOULDER, ARTHRITIS, DEGEN./OSTEO     Other   IMPINGEMENT SYNDROME   HIGH BLOOD PRESSURE   Generalized anxiety disorder       Imaging: Dg Chest 2 View  05/20/2016  CLINICAL DATA:  Weakness, weight loss, high blood pressure EXAM: CHEST  2 VIEW COMPARISON:  Chest x-ray of 12/03/2015, CT  chest of 11/04/2015, and chest x-ray of 02/02/2012 FINDINGS: No active infiltrate or  effusion is seen. There is a 4 mm nodular opacity noted in the periphery the right upper lung field overlying the anterolateral aspect of the right second rib. The majority the prior chest x-rays have electrodes overlying that very region obscuring detail. However the chest x-ray from 2013 does not show a nodule at that site. CT of the chest may be helpful to assess further versus close followup chest x-ray. Mediastinal and hilar contours are unremarkable. The heart is borderline enlarged. No acute bony abnormality is seen. IMPRESSION: 1. No active infiltrate or effusion. 2. 4 mm nodule in the periphery of the right upper lobe not seen on prior chest x-ray from 2013 and obscured on all intervening chest x-rays since 2013. Consider CT of the chest to evaluate further. Electronically Signed   By: Ivar Drape M.D.   On: 05/20/2016 08:58   Ct Chest Wo Contrast  05/25/2016  CLINICAL DATA:  82 year old female former smoker with history of breast cancer diagnosed 20 years ago. Abnormal chest x-ray from 05/19/2016 demonstrating small nodule in the right upper lobe. Followup study. EXAM: CT CHEST WITHOUT CONTRAST TECHNIQUE: Multidetector CT imaging of the chest was performed following the standard protocol without IV contrast. COMPARISON:  Chest CT 11/04/2015.  Chest x-ray 05/19/2016. FINDINGS: Mediastinum/Lymph Nodes: Heart size is normal. There is no significant pericardial fluid, thickening or pericardial calcification. There is atherosclerosis of the thoracic aorta, the great vessels of the mediastinum and the coronary arteries, including calcified atherosclerotic plaque in the left main, left anterior descending and right coronary arteries. No pathologically enlarged mediastinal or hilar lymph nodes. Please note that accurate exclusion of hilar adenopathy is limited on noncontrast CT scans. Dilatation of the pulmonic trunk (3.5 cm in  diameter), suggestive of pulmonary arterial hypertension. Esophagus is unremarkable in appearance. No axillary lymphadenopathy. Lungs/Pleura: No suspicious appearing pulmonary nodule or mass. Specifically, no finding to account for the perceived new nodular opacity in the right upper lobe on recent chest x-ray. There is diffuse bronchial thickening with a relatively widespread cylindrical bronchiectasis, most evident in the lower lobes of the lungs bilaterally. Some patchy areas of ground-glass attenuation and septal thickening are also noted throughout the basal portions of both lungs. No acute consolidative airspace disease. No pleural effusions. Upper Abdomen: There is an incompletely visualized 3.5 cm low-attenuation lesion in the interpolar region of the right kidney, which is incompletely characterized but likely to represent a cyst. Atherosclerosis. Musculoskeletal/Soft Tissues: 1.4 x 1.4 x 2.4 cm soft tissue attenuation (52 HU) lesion in the medial aspect of the left breast (image 35 of series 2 and coronal image 15 of series 4). There are no aggressive appearing lytic or blastic lesions noted in the visualized portions of the skeleton. IMPRESSION: 1. No suspicious appearing pulmonary nodules or masses identified. 2. However, there is a 1.4 x 1.4 x 2.4 cm soft tissue nodule in the medial aspect of the left breast. Correlation with mammography and/or breast ultrasound is suggested in the near future to better evaluate this finding and exclude recurrent breast neoplasm. 3. There is a spectrum of imaging findings in the lungs which may indicate interstitial lung disease, including bronchiectasis, areas of ground-glass attenuation and some areas of septal thickening. Repeat evaluation with high-resolution chest CT is suggested in 6-12 months to assess for temporal changes in the appearance of the lung parenchyma if clinically appropriate. 4. Dilatation of the pulmonic trunk (3.5 cm in diameter), which may  suggest pulmonary arterial hypertension. 5. Atherosclerosis, including left main and 2  vessel coronary artery disease. 6. Additional incidental findings, as above. These results will be called to the ordering clinician or representative by the Radiologist Assistant, and communication documented in the PACS or zVision Dashboard. Electronically Signed   By: Vinnie Langton M.D.   On: 05/25/2016 10:46   US Breast Ltd Uni Left Inc Axilla  05/26/2016  CLINICAL DATA:  81 year old female presenting for evaluation of a possible left breast mass. The patient had a recent CT scan in 05/25/2016 showing a a left breast mass in the upper inner chest below the clavicle. Patient has history of right breast lumpectomy in 2000. The patient states she has had recent unexplained weight loss. EXAM: 2D DIGITAL DIAGNOSTIC UNILATERAL LEFT MAMMOGRAM WITH CAD AND ADJUNCT TOMO LEFT BREAST ULTRASOUND COMPARISON:  Previous exam(s). ACR Breast Density Category b: There are scattered areas of fibroglandular density. FINDINGS: On the initial routine mammographic images of the left breast, no new mammographic abnormalities can be visualized. However, after going to ultrasound and identifying a sonographically evident mass, the patient was brought back to mammography in for a spot tangential image demonstrating an irregular mass with pleomorphic calcifications measuring approximately 2.5 cm. Mammographic images were processed with CAD. Physical exam of the upper inner quadrant of the left breast demonstrates a firm mass slightly inferior to the clavicle. Ultrasound targeted to the palpable area of concern at 11 o'clock, 10 cm from the nipple demonstrates an irregular hypoechoic mass which in radial and anti radial planes measures 2.0 x 1.1 x 1.4 cm. However, and oblique plane the mass measures up to 2.9 cm. The mass abuts the chest wall. Blood flow is documented within the mass on color Doppler imaging. Ultrasound of the left axilla demonstrates  no suspicious appearing lymph nodes. IMPRESSION: 1. There is a highly suspicious 2.9 cm mass with pleomorphic calcifications at 11 o'clock corresponding with the mass identified on CT. 2.  No abnormal left axillary lymph nodes. RECOMMENDATION: Ultrasound-guided biopsy is recommended for the left breast mass at 11 o'clock. This has been scheduled for 06/09/2016 at 8:00 a.m. I have discussed the findings and recommendations with the patient. Results were also provided in writing at the conclusion of the visit. If applicable, a reminder letter will be sent to the patient regarding the next appointment. BI-RADS CATEGORY  5: Highly suggestive of malignancy. Electronically Signed   By: Ammie Ferrier M.D.   On: 05/26/2016 15:13   Mm Diag Breast Tomo Uni Left  05/26/2016  CLINICAL DATA:  82 year old female presenting for evaluation of a possible left breast mass. The patient had a recent CT scan in 05/25/2016 showing a a left breast mass in the upper inner chest below the clavicle. Patient has history of right breast lumpectomy in 2000. The patient states she has had recent unexplained weight loss. EXAM: 2D DIGITAL DIAGNOSTIC UNILATERAL LEFT MAMMOGRAM WITH CAD AND ADJUNCT TOMO LEFT BREAST ULTRASOUND COMPARISON:  Previous exam(s). ACR Breast Density Category b: There are scattered areas of fibroglandular density. FINDINGS: On the initial routine mammographic images of the left breast, no new mammographic abnormalities can be visualized. However, after going to ultrasound and identifying a sonographically evident mass, the patient was brought back to mammography in for a spot tangential image demonstrating an irregular mass with pleomorphic calcifications measuring approximately 2.5 cm. Mammographic images were processed with CAD. Physical exam of the upper inner quadrant of the left breast demonstrates a firm mass slightly inferior to the clavicle. Ultrasound targeted to the palpable area of concern at 11  o'clock, 10  cm from the nipple demonstrates an irregular hypoechoic mass which in radial and anti radial planes measures 2.0 x 1.1 x 1.4 cm. However, and oblique plane the mass measures up to 2.9 cm. The mass abuts the chest wall. Blood flow is documented within the mass on color Doppler imaging. Ultrasound of the left axilla demonstrates no suspicious appearing lymph nodes. IMPRESSION: 1. There is a highly suspicious 2.9 cm mass with pleomorphic calcifications at 11 o'clock corresponding with the mass identified on CT. 2.  No abnormal left axillary lymph nodes. RECOMMENDATION: Ultrasound-guided biopsy is recommended for the left breast mass at 11 o'clock. This has been scheduled for 06/09/2016 at 8:00 a.m. I have discussed the findings and recommendations with the patient. Results were also provided in writing at the conclusion of the visit. If applicable, a reminder letter will be sent to the patient regarding the next appointment. BI-RADS CATEGORY  5: Highly suggestive of malignancy. Electronically Signed   By: Ammie Ferrier M.D.   On: 05/26/2016 15:13

## 2016-06-01 NOTE — Patient Instructions (Signed)
Medication Instructions:  TAKE HYDRALAZINE 50 MG TWO TIMES DAILY ( TAKE EVERY 12 HOURS)  YOU MAY TAKE A TYLENOL PM AS NEEDED AT NIGHT TIME TO HELP YOU SLEEP   Labwork: NONE  Testing/Procedures: NONE  Follow-Up: Your physician recommends that you schedule a follow-up appointment in: [place] Charlotte [name], N.P.   Any Other Special Instructions Will Be Listed Below (If Applicable).  TAKE YOUR BLOOD PRESSURE ONLY ONE TIME DAILY

## 2016-06-01 NOTE — Progress Notes (Signed)
Cardiology Office Note   Date:  06/01/2016   ID:  KAE WHARFF, DOB 14-Sep-1928, MRN [id number]  PCP:  Rosita Fire, MD  Cardiologist: Cloria Spring, NP   Chief Complaint  Patient presents with  . Hypertension    History of Present Illness: Charlotte [name] is a 82 y.o. female who presents for ongoing assessment and management of hypertension, with history of hypertension and chronic anxiety. She has been decreased on hydralazine dose to 37.5 mg BID due to hypotension and fatigue per phone note. She is here for evaluation of medication change response.   She today stating that she has seen her primary care physician in the interim who has increased her hydralazine back up to 50 mg twice a day. The patient is very assessment her blood pressure and takes it every 2 hours at home. Her blood pressure report range his blood pressure between [id number] and [id number] systolic throughout the day. The patient states that she takes her hydralazine in the morning and early afternoon instead of every 12 hours. She also complains of having trouble sleeping getting up at night several times. She otherwise is taking her medications as directed. She is quite anxious. Her daughter who is with her states, that she is very obsessesive  with blood pressure monitoring.   Past Medical History  Diagnosis Date  . Hypertension   . Cancer [facility])     breast cancer  . Fatigue   . Anxiety     Past Surgical History  Procedure Laterality Date  . Breast lumpectomy      right side  . Tonsillectomy    . Dilation and curettage of uterus    . Foot surgery       Current Outpatient Prescriptions  Medication Sig Dispense Refill  . acetaminophen (TYLENOL) 500 MG tablet Take 1,000 mg by mouth daily as needed for mild pain. Reported on 01/27/2016    . aspirin EC 81 MG tablet Take 81 mg by mouth daily.    . Calcium Carbonate-Vitamin D (CALCIUM 600+D3 PO) Take 1 tablet by mouth 2 (two) times daily.    . carbamide peroxide  (DEBROX) 6.5 % otic solution Place 1 drop into both ears daily as needed (ear wax buildup).    . cycloSPORINE (RESTASIS) 0.05 % ophthalmic emulsion Place 1 drop into both eyes 2 (two) times daily.    . felodipine (PLENDIL) 5 MG 24 hr tablet Take 5 mg by mouth daily.     . fluticasone (FLONASE) 50 MCG/ACT nasal spray Place 2 sprays into the nose daily as needed for allergies.    . hydrALAZINE (APRESOLINE) 50 MG tablet Take 1 tablet (50 mg total) by mouth 2 (two) times daily. 180 tablet 3  . Liniments (SALONPAS PAIN RELIEF PATCH) PADS Apply 1 each topically daily as needed (pain).    Marland Kitchen losartan (COZAAR) 100 MG tablet Take 100 mg by mouth daily.    . Melatonin 5 MG TABS Take 5 mg by mouth daily.    . Multiple Vitamins-Minerals (CENTRUM ADULTS PO) Take 1 tablet by mouth daily.    . Omega-3 Fatty Acids (FISH OIL TRIPLE STRENGTH) 1400 MG CAPS Take 1 tablet by mouth daily.    Vladimir Faster Glycol-Propyl Glycol (SYSTANE OP) Apply 1 drop to eye 2 (two) times daily.     . temazepam (RESTORIL) 7.5 MG capsule Take 7.5 mg by mouth at bedtime as needed for sleep.    . TOVIAZ 4 MG TB24 tablet Take 4 mg by mouth  daily.  2  . tretinoin (RETIN-A) 0.025 % cream Apply 1 application topically at bedtime.   3   No current facility-administered medications for this visit.    Allergies:   Latex; Motrin; and Bextra    Social History:  The patient  reports that she has never smoked. She has never used smokeless tobacco. She reports that she does not drink alcohol or use illicit drugs.   Family History:  The patient's family history includes Cancer in her mother; Heart attack in her mother.    ROS: All other systems are reviewed and negative. Unless otherwise mentioned in H&P    PHYSICAL EXAM: VS:  BP 134/72 mmHg  Pulse 75  Ht 5\' 3"  (1.6 m)  Wt 121 lb (54.885 kg)  BMI 21.44 kg/m2  SpO2 97% , BMI Body mass index is 21.44 kg/(m^2). GEN: Well nourished, well developed, in no acute distress HEENT: normal Neck:  no JVD, carotid bruits, or masses Cardiac: RRR; no murmurs, rubs, or gallops,no edema  Respiratory:  clear to auscultation bilaterally, normal work of breathing GI: soft, nontender, nondistended, + BS MS: no deformity or atrophy Skin: warm and dry, no rash Neuro:  Strength and sensation are intact Psych: euthymic mood, full affect  Recent Labs: 08/30/2015: TSH 1.269 09/25/2015: Magnesium 1.7 10/11/2015: B Natriuretic Peptide 17.0 11/04/2015: ALT 34 12/03/2015: BUN 18; Creatinine, Ser 0.92; Hemoglobin 12.0; Platelets 339; Potassium 3.0*; Sodium 132*     Wt Readings from Last 3 Encounters:  06/01/16 121 lb (54.885 kg)  04/20/16 127 lb (57.607 kg)  03/09/16 127 lb (57.607 kg)     ASSESSMENT AND PLAN:  1.  Hypertension:the patient is advised to take hydralazine every 12 hours instead of in the morning and early afternoon. She is also advised to take her blood pressure once a day at the same time every day and record. The patient daughter is going to [place] are also not to take her blood pressure every 2 hours at home as she has been. The patient has also been advised to take her other medications as directed. We'll see her again in about 6 weeks to evaluate her response to medication dosing time changes. Also to review her blood pressure chart.  2. Anxiety with insomnia: the patient is advised to take over-the-counter Tylenol with Benadryl or its generic equivalent, only one tablet. This will be 325/12.5 mg, when necessary insomnia. She is on Restoril but this leaves her feeling groggy and tired for days. She is to followup with her primary care physician for any further medication adjustments under this setting.   Current medicines are reviewed at length with the patient today.  I spent approximately 40 minutes with this patient and her daughter going over medications, providing counseling on blood pressure monitoring, and need to take it once a day, as well as dosing requirements other  medicines.  Labs/ tests ordered today include:  No orders of the defined types were placed in this encounter.     Disposition:   FU with  6 weeks. Jory Sims, NP  06/01/2016 3:01 PM    [facility] [address] [place],  [address] Phone: [phone]; Fax: [phone]

## 2016-06-02 ENCOUNTER — Ambulatory Visit (HOSPITAL_COMMUNITY)
Admission: RE | Admit: 2016-06-02 | Discharge: 2016-06-02 | Disposition: A | Discharge status: Home / Self Care | Payer: Medicare Other | Source: Ambulatory Visit | Attending: Internal Medicine | Admitting: Internal Medicine

## 2016-06-02 ENCOUNTER — Encounter (HOSPITAL_COMMUNITY): Payer: Self-pay

## 2016-06-02 ENCOUNTER — Other Ambulatory Visit (HOSPITAL_COMMUNITY): Payer: Self-pay | Admitting: Internal Medicine

## 2016-06-02 DIAGNOSIS — R229 Localized swelling, mass and lump, unspecified: Secondary | ICD-10-CM

## 2016-06-02 DIAGNOSIS — N6320 Unspecified lump in the left breast, unspecified quadrant: Secondary | ICD-10-CM

## 2016-06-02 DIAGNOSIS — D045 Carcinoma in situ of skin of trunk: Secondary | ICD-10-CM | POA: Diagnosis not present

## 2016-06-02 DIAGNOSIS — N63 Unspecified lump in breast: Secondary | ICD-10-CM | POA: Diagnosis not present

## 2016-06-02 DIAGNOSIS — IMO0002 Reserved for concepts with insufficient information to code with codable children: Secondary | ICD-10-CM

## 2016-06-02 DIAGNOSIS — C50212 Malignant neoplasm of upper-inner quadrant of left female breast: Secondary | ICD-10-CM | POA: Diagnosis not present

## 2016-06-02 MED ORDER — LIDOCAINE HCL (PF) 1 % IJ SOLN
INTRAMUSCULAR | Status: AC
Start: 2016-06-02 — End: 2016-06-02
  Filled 2016-06-02: qty 10

## 2016-06-09 ENCOUNTER — Other Ambulatory Visit (HOSPITAL_COMMUNITY): Payer: Self-pay

## 2016-06-10 ENCOUNTER — Other Ambulatory Visit: Payer: Self-pay | Admitting: General Surgery

## 2016-06-10 DIAGNOSIS — C50212 Malignant neoplasm of upper-inner quadrant of left female breast: Secondary | ICD-10-CM

## 2016-06-15 ENCOUNTER — Telehealth: Payer: Self-pay | Admitting: Hematology and Oncology

## 2016-06-15 ENCOUNTER — Encounter: Payer: Self-pay | Admitting: Hematology and Oncology

## 2016-06-15 ENCOUNTER — Telehealth: Payer: Self-pay | Admitting: *Deleted

## 2016-06-15 NOTE — Telephone Encounter (Signed)
Appointment scheduled 7/12 at 3:45pm with Gudena, scheduled after rad onc appoinment on the same day. Patient and daughter agreed. Demographics verified. Letter to referring.

## 2016-06-15 NOTE — Telephone Encounter (Signed)
Received appt date/time from Andrea.  Mailed new pt packet to pt.  

## 2016-06-16 ENCOUNTER — Encounter (HOSPITAL_COMMUNITY): Payer: Self-pay

## 2016-06-16 ENCOUNTER — Encounter (HOSPITAL_COMMUNITY)
Admission: RE | Admit: 2016-06-16 | Discharge: 2016-06-16 | Disposition: A | Discharge status: Home / Self Care | Payer: Medicare Other | Source: Ambulatory Visit | Attending: General Surgery | Admitting: General Surgery

## 2016-06-16 ENCOUNTER — Other Ambulatory Visit (HOSPITAL_COMMUNITY): Payer: Self-pay | Admitting: *Deleted

## 2016-06-16 DIAGNOSIS — D0512 Intraductal carcinoma in situ of left breast: Secondary | ICD-10-CM | POA: Diagnosis not present

## 2016-06-16 DIAGNOSIS — Z7982 Long term (current) use of aspirin: Secondary | ICD-10-CM | POA: Diagnosis not present

## 2016-06-16 DIAGNOSIS — C50212 Malignant neoplasm of upper-inner quadrant of left female breast: Secondary | ICD-10-CM | POA: Diagnosis not present

## 2016-06-16 DIAGNOSIS — Z170 Estrogen receptor positive status [ER+]: Secondary | ICD-10-CM | POA: Diagnosis not present

## 2016-06-16 DIAGNOSIS — F22 Delusional disorders: Secondary | ICD-10-CM | POA: Diagnosis not present

## 2016-06-16 DIAGNOSIS — C50912 Malignant neoplasm of unspecified site of left female breast: Secondary | ICD-10-CM | POA: Diagnosis present

## 2016-06-16 DIAGNOSIS — Z79899 Other long term (current) drug therapy: Secondary | ICD-10-CM | POA: Diagnosis not present

## 2016-06-16 DIAGNOSIS — R0682 Tachypnea, not elsewhere classified: Secondary | ICD-10-CM | POA: Diagnosis not present

## 2016-06-16 DIAGNOSIS — F419 Anxiety disorder, unspecified: Secondary | ICD-10-CM | POA: Diagnosis not present

## 2016-06-16 DIAGNOSIS — I10 Essential (primary) hypertension: Secondary | ICD-10-CM | POA: Diagnosis not present

## 2016-06-16 HISTORY — DX: Personal history of other endocrine, nutritional and metabolic disease: Z86.39

## 2016-06-16 HISTORY — DX: Unspecified osteoarthritis, unspecified site: M19.90

## 2016-06-16 HISTORY — DX: Insomnia, unspecified: G47.00

## 2016-06-16 LAB — CBC
HCT: 39.7 % (ref 36.0–46.0)
HEMOGLOBIN: 12.9 g/dL (ref 12.0–15.0)
MCH: 28.3 pg (ref 26.0–34.0)
MCHC: 32.5 g/dL (ref 30.0–36.0)
MCV: 87.1 fL (ref 78.0–100.0)
PLATELETS: 366 10*3/uL (ref 150–400)
RBC: 4.56 MIL/uL (ref 3.87–5.11)
RDW: 14.7 % (ref 11.5–15.5)
WBC: 5.9 10*3/uL (ref 4.0–10.5)

## 2016-06-16 LAB — BASIC METABOLIC PANEL
ANION GAP: 6 (ref 5–15)
BUN: 8 mg/dL (ref 6–20)
CALCIUM: 9.6 mg/dL (ref 8.9–10.3)
CO2: 30 mmol/L (ref 22–32)
CREATININE: 1.05 mg/dL — AB (ref 0.44–1.00)
Chloride: 97 mmol/L — ABNORMAL LOW (ref 101–111)
GFR calc non Af Amer: 47 mL/min — ABNORMAL LOW (ref >60)
GFR, EST AFRICAN AMERICAN: 55 mL/min — AB (ref >60)
Glucose, Bld: 111 mg/dL — ABNORMAL HIGH (ref 65–99)
Potassium: 4.1 mmol/L (ref 3.5–5.1)
SODIUM: 133 mmol/L — AB (ref 135–145)

## 2016-06-16 MED ORDER — CEFAZOLIN SODIUM-DEXTROSE 2-4 GM/100ML-% IV SOLN
2.0000 g | INTRAVENOUS | Status: AC
  Filled 2016-06-16: qty 100

## 2016-06-16 NOTE — Progress Notes (Signed)
PCP - Rosita Fire Cardiologist - [facility]  Chest x-ray - none needed EKG - 12/03/15 Stress Test - denies  ECHO - 08/21/15 Cardiac Cath - denies    Patient denies shortness of breath and chest pain at PAT appointment

## 2016-06-16 NOTE — Pre-Procedure Instructions (Signed)
STEPFANIE PLAZOLA  06/16/2016      [facility] - [place], [address] [place] [address] Phone: [phone] Fax: [phone]    Your procedure is scheduled on Wednesday June 28th,   Report to [facility] at [place].M.  Call this number if you have problems the morning of surgery:  [phone]   Remember:  Do not eat food or drink liquids after midnight.   Take these medicines the morning of surgery with A SIP OF WATER acetaminophen (tylenol), eye drops, fluticasone (flonase), felodipine (plendil), hydralazine (aprespline), Ativan 0.5mg    7 days prior to surgery STOP taking any Aspirin, Aleve, Naproxen, Ibuprofen, Motrin, Advil, Goody's, BC's, all herbal medications, fish oil, and all vitamins    Do not wear jewelry, make-up or nail polish.  Do not wear lotions, powders, or perfumes.  You may NOT wear deoderant.  Do not shave 48 hours prior to surgery.   Do not bring valuables to the hospital.  [facility] is not responsible for any belongings or valuables.  Contacts, dentures or bridgework may not be worn into surgery.  Leave your suitcase in the car.  After surgery it may be brought to your room.  For patients admitted to the hospital, discharge time will be determined by your treatment team.  Patients discharged the day of surgery will not be allowed to drive home.    Special instructions:   [place]- Preparing For Surgery  Before surgery, you can play an important role. Because skin is not sterile, your skin needs to be as free of germs as possible. You can reduce the number of germs on your skin by washing with CHG (chlorahexidine gluconate) Soap before surgery.  CHG is an antiseptic cleaner which kills germs and bonds with the skin to continue killing germs even after washing.  Please do not use if you have an allergy to CHG or antibacterial soaps. If your skin becomes reddened/irritated stop using the CHG.   Do not shave (including legs and underarms) for at least 48 hours prior to first CHG shower. It is OK to shave your face.  Please follow these instructions carefully.   1. Shower the NIGHT BEFORE SURGERY and the MORNING OF SURGERY with CHG.   2. If you chose to wash your hair, wash your hair first as usual with your normal shampoo.  3. After you shampoo, rinse your hair and body thoroughly to remove the shampoo.  4. Use CHG as you would any other liquid soap. You can apply CHG directly to the skin and wash gently with a scrungie or a clean washcloth.   5. Apply the CHG Soap to your body ONLY FROM THE NECK DOWN.  Do not use on open wounds or open sores. Avoid contact with your eyes, ears, mouth and genitals (private parts). Wash genitals (private parts) with your normal soap.  6. Wash thoroughly, paying special attention to the area where your surgery will be performed.  7. Thoroughly rinse your body with warm water from the neck down.  8. DO NOT shower/wash with your normal soap after using and rinsing off the CHG Soap.  9. Pat yourself dry with a CLEAN TOWEL.   10. Wear CLEAN PAJAMAS   11. Place CLEAN SHEETS on your bed the night of your first shower and DO NOT SLEEP WITH PETS.    Day of Surgery: Do not apply any deodorants/lotions. Please wear clean clothes  to the hospital/surgery center.      Please read over the following fact sheets that you were given. Pain Booklet and Surgical Site Infection Prevention

## 2016-06-17 ENCOUNTER — Ambulatory Visit (HOSPITAL_COMMUNITY)
Admission: RE | Admit: 2016-06-17 | Discharge: 2016-06-17 | Disposition: A | Discharge status: Home / Self Care | Payer: Medicare Other | Source: Ambulatory Visit | Attending: General Surgery | Admitting: General Surgery

## 2016-06-17 ENCOUNTER — Ambulatory Visit (HOSPITAL_COMMUNITY): Payer: Medicare Other | Admitting: Anesthesiology

## 2016-06-17 ENCOUNTER — Ambulatory Visit: Payer: Self-pay | Admitting: Cardiology

## 2016-06-17 ENCOUNTER — Encounter (HOSPITAL_COMMUNITY)
Admission: RE | Disposition: A | Discharge status: Home / Self Care | Payer: Self-pay | Source: Ambulatory Visit | Attending: General Surgery

## 2016-06-17 DIAGNOSIS — F419 Anxiety disorder, unspecified: Secondary | ICD-10-CM | POA: Insufficient documentation

## 2016-06-17 DIAGNOSIS — G8918 Other acute postprocedural pain: Secondary | ICD-10-CM | POA: Diagnosis not present

## 2016-06-17 DIAGNOSIS — Z170 Estrogen receptor positive status [ER+]: Secondary | ICD-10-CM | POA: Diagnosis not present

## 2016-06-17 DIAGNOSIS — D0512 Intraductal carcinoma in situ of left breast: Secondary | ICD-10-CM | POA: Insufficient documentation

## 2016-06-17 DIAGNOSIS — Z79899 Other long term (current) drug therapy: Secondary | ICD-10-CM | POA: Diagnosis not present

## 2016-06-17 DIAGNOSIS — I10 Essential (primary) hypertension: Secondary | ICD-10-CM | POA: Diagnosis not present

## 2016-06-17 DIAGNOSIS — C50912 Malignant neoplasm of unspecified site of left female breast: Secondary | ICD-10-CM | POA: Diagnosis not present

## 2016-06-17 DIAGNOSIS — C50212 Malignant neoplasm of upper-inner quadrant of left female breast: Secondary | ICD-10-CM

## 2016-06-17 DIAGNOSIS — Z7982 Long term (current) use of aspirin: Secondary | ICD-10-CM | POA: Diagnosis not present

## 2016-06-17 HISTORY — PX: BREAST LUMPECTOMY WITH AXILLARY LYMPH NODE BIOPSY: SHX5593

## 2016-06-17 SURGERY — BREAST LUMPECTOMY WITH AXILLARY LYMPH NODE BIOPSY
Anesthesia: Regional | Site: Breast | Laterality: Left

## 2016-06-17 MED ORDER — HYDROCODONE-ACETAMINOPHEN 5-325 MG PO TABS: 1.0000 | ORAL_TABLET | Freq: Four times a day (QID) | ORAL | Status: DC | PRN

## 2016-06-17 MED ORDER — CEFAZOLIN SODIUM-DEXTROSE 2-3 GM-% IV SOLR
INTRAVENOUS | Status: DC | PRN
  Administered 2016-06-17: 2 g via INTRAVENOUS

## 2016-06-17 MED ORDER — PROPOFOL 10 MG/ML IV BOLUS
INTRAVENOUS | Status: DC | PRN
  Administered 2016-06-17: 150 mg via INTRAVENOUS

## 2016-06-17 MED ORDER — ONDANSETRON HCL 4 MG/2ML IJ SOLN
INTRAMUSCULAR | Status: AC
  Filled 2016-06-17: qty 4

## 2016-06-17 MED ORDER — LACTATED RINGERS IV SOLN
INTRAVENOUS | Status: DC | PRN
  Administered 2016-06-17: 08:00:00 via INTRAVENOUS

## 2016-06-17 MED ORDER — ROCURONIUM BROMIDE 50 MG/5ML IV SOLN
INTRAVENOUS | Status: AC
  Filled 2016-06-17: qty 1

## 2016-06-17 MED ORDER — 0.9 % SODIUM CHLORIDE (POUR BTL) OPTIME
TOPICAL | Status: DC | PRN
  Administered 2016-06-17: 1000 mL

## 2016-06-17 MED ORDER — SODIUM CHLORIDE 0.9 % IJ SOLN
INTRAMUSCULAR | Status: AC
  Filled 2016-06-17: qty 10

## 2016-06-17 MED ORDER — BUPIVACAINE-EPINEPHRINE (PF) 0.5% -1:200000 IJ SOLN
INTRAMUSCULAR | Status: DC | PRN
  Administered 2016-06-17: 25 mL via PERINEURAL

## 2016-06-17 MED ORDER — BUPIVACAINE-EPINEPHRINE 0.25% -1:200000 IJ SOLN
INTRAMUSCULAR | Status: DC | PRN
  Administered 2016-06-17: 6 mL

## 2016-06-17 MED ORDER — TECHNETIUM TC 99M SULFUR COLLOID FILTERED
1.0000 | Freq: Once | INTRAVENOUS | Status: AC | PRN
  Administered 2016-06-17: 1 via INTRADERMAL

## 2016-06-17 MED ORDER — EPHEDRINE 5 MG/ML INJ
INTRAVENOUS | Status: AC
  Filled 2016-06-17: qty 20

## 2016-06-17 MED ORDER — FENTANYL CITRATE (PF) 100 MCG/2ML IJ SOLN
25.0000 ug | INTRAMUSCULAR | Status: DC | PRN
  Administered 2016-06-17 (×2): 25 ug via INTRAVENOUS

## 2016-06-17 MED ORDER — BUPIVACAINE-EPINEPHRINE (PF) 0.25% -1:200000 IJ SOLN
INTRAMUSCULAR | Status: AC
  Filled 2016-06-17: qty 30

## 2016-06-17 MED ORDER — LIDOCAINE 2% (20 MG/ML) 5 ML SYRINGE
INTRAMUSCULAR | Status: AC
  Filled 2016-06-17: qty 10

## 2016-06-17 MED ORDER — PROPOFOL 10 MG/ML IV BOLUS
INTRAVENOUS | Status: AC
  Filled 2016-06-17: qty 20

## 2016-06-17 MED ORDER — OXYCODONE HCL 5 MG/5ML PO SOLN: 5.0000 mg | Freq: Once | ORAL | Status: DC | PRN

## 2016-06-17 MED ORDER — LIDOCAINE HCL (CARDIAC) 20 MG/ML IV SOLN
INTRAVENOUS | Status: DC | PRN
  Administered 2016-06-17: 30 mg via INTRAVENOUS

## 2016-06-17 MED ORDER — OXYCODONE HCL 5 MG PO TABS: 5.0000 mg | ORAL_TABLET | Freq: Once | ORAL | Status: DC | PRN

## 2016-06-17 MED ORDER — CHLORHEXIDINE GLUCONATE CLOTH 2 % EX PADS: 6.0000 | MEDICATED_PAD | Freq: Once | CUTANEOUS | Status: DC

## 2016-06-17 MED ORDER — EPHEDRINE SULFATE 50 MG/ML IJ SOLN
INTRAMUSCULAR | Status: DC | PRN
  Administered 2016-06-17 (×3): 10 mg via INTRAVENOUS

## 2016-06-17 MED ORDER — FENTANYL CITRATE (PF) 250 MCG/5ML IJ SOLN
INTRAMUSCULAR | Status: AC
  Filled 2016-06-17: qty 5

## 2016-06-17 MED ORDER — FENTANYL CITRATE (PF) 100 MCG/2ML IJ SOLN
INTRAMUSCULAR | Status: AC
  Filled 2016-06-17: qty 2

## 2016-06-17 MED ORDER — METHYLENE BLUE 0.5 % INJ SOLN
INTRAVENOUS | Status: AC
  Filled 2016-06-17: qty 10

## 2016-06-17 MED ORDER — ONDANSETRON HCL 4 MG/2ML IJ SOLN: 4.0000 mg | Freq: Four times a day (QID) | INTRAMUSCULAR | Status: DC | PRN

## 2016-06-17 MED ORDER — FENTANYL CITRATE (PF) 100 MCG/2ML IJ SOLN
INTRAMUSCULAR | Status: DC | PRN
  Administered 2016-06-17 (×3): 25 ug via INTRAVENOUS
  Administered 2016-06-17: 50 ug via INTRAVENOUS

## 2016-06-17 SURGICAL SUPPLY — 45 items
APPLIER CLIP 9.375 MED OPEN (MISCELLANEOUS) ×3
BINDER BREAST LRG (GAUZE/BANDAGES/DRESSINGS) ×3 IMPLANT
BINDER BREAST XLRG (GAUZE/BANDAGES/DRESSINGS) IMPLANT
BLADE SURG 15 STRL LF DISP TIS (BLADE) ×2 IMPLANT
BLADE SURG 15 STRL SS (BLADE) ×4
CANISTER SUCTION 2500CC (MISCELLANEOUS) ×3 IMPLANT
CHLORAPREP W/TINT 26ML (MISCELLANEOUS) ×3 IMPLANT
CLIP APPLIE 9.375 MED OPEN (MISCELLANEOUS) ×1 IMPLANT
CONT SPEC 4OZ CLIKSEAL STRL BL (MISCELLANEOUS) ×6 IMPLANT
COVER PROBE W GEL 5X96 (DRAPES) ×3 IMPLANT
COVER SURGICAL LIGHT HANDLE (MISCELLANEOUS) ×3 IMPLANT
DRAPE CHEST BREAST 15X10 FENES (DRAPES) ×3 IMPLANT
DRAPE UTILITY XL STRL (DRAPES) ×6 IMPLANT
ELECT COATED BLADE 2.86 ST (ELECTRODE) ×3 IMPLANT
ELECT REM PT RETURN 9FT ADLT (ELECTROSURGICAL) ×3
ELECTRODE REM PT RTRN 9FT ADLT (ELECTROSURGICAL) ×1 IMPLANT
GLOVE BIO SURGEON STRL SZ7.5 (GLOVE) ×3 IMPLANT
GLOVE BIOGEL PI IND STRL 7.0 (GLOVE) ×1 IMPLANT
GLOVE BIOGEL PI INDICATOR 7.0 (GLOVE) ×2
GLOVE SURG SS PI 7.0 STRL IVOR (GLOVE) ×3 IMPLANT
GOWN STRL REUS W/ TWL LRG LVL3 (GOWN DISPOSABLE) ×2 IMPLANT
GOWN STRL REUS W/TWL LRG LVL3 (GOWN DISPOSABLE) ×4
KIT BASIN OR (CUSTOM PROCEDURE TRAY) ×3 IMPLANT
KIT MARKER MARGIN INK (KITS) ×3 IMPLANT
KIT ROOM TURNOVER OR (KITS) ×3 IMPLANT
LIQUID BAND (GAUZE/BANDAGES/DRESSINGS) ×3 IMPLANT
NEEDLE 18GX1X1/2 (RX/OR ONLY) (NEEDLE) ×3 IMPLANT
NEEDLE FILTER BLUNT 18X 1/2SAF (NEEDLE)
NEEDLE FILTER BLUNT 18X1 1/2 (NEEDLE) IMPLANT
NEEDLE HYPO 25GX1X1/2 BEV (NEEDLE) ×6 IMPLANT
NS IRRIG 1000ML POUR BTL (IV SOLUTION) ×3 IMPLANT
PACK SURGICAL SETUP 50X90 (CUSTOM PROCEDURE TRAY) ×3 IMPLANT
PAD ARMBOARD 7.5X6 YLW CONV (MISCELLANEOUS) ×3 IMPLANT
PENCIL BUTTON HOLSTER BLD 10FT (ELECTRODE) ×3 IMPLANT
SPONGE LAP 18X18 X RAY DECT (DISPOSABLE) ×3 IMPLANT
SUT MNCRL AB 4-0 PS2 18 (SUTURE) ×6 IMPLANT
SUT SILK 2 0 SH (SUTURE) IMPLANT
SUT VIC AB 3-0 SH 18 (SUTURE) ×3 IMPLANT
SYR BULB 3OZ (MISCELLANEOUS) ×3 IMPLANT
SYR CONTROL 10ML LL (SYRINGE) ×3 IMPLANT
TOWEL OR 17X24 6PK STRL BLUE (TOWEL DISPOSABLE) IMPLANT
TOWEL OR 17X26 10 PK STRL BLUE (TOWEL DISPOSABLE) ×3 IMPLANT
TUBE CONNECTING 12'X1/4 (SUCTIONS) ×1
TUBE CONNECTING 12X1/4 (SUCTIONS) ×2 IMPLANT
YANKAUER SUCT BULB TIP NO VENT (SUCTIONS) ×3 IMPLANT

## 2016-06-17 NOTE — Anesthesia Procedure Notes (Addendum)
Procedure Name: LMA Insertion Date/Time: 06/17/2016 8:38 AM Performed by: Neldon Newport Pre-anesthesia Checklist: Timeout performed, Patient being monitored, Suction available, Emergency Drugs available and Patient identified Patient Re-evaluated:Patient Re-evaluated prior to inductionOxygen Delivery Method: Circle system utilized Preoxygenation: Pre-oxygenation with 100% oxygen Intubation Type: IV induction Ventilation: Mask ventilation without difficulty LMA: LMA inserted LMA Size: 4.0 Number of attempts: 1 Placement Confirmation: breath sounds checked- equal and bilateral,  positive ETCO2 and ETT inserted through vocal cords under direct vision Tube secured with: Tape Dental Injury: Teeth and Oropharynx as per pre-operative assessment    Anesthesia Regional Block:  Pectoralis block  Pre-Anesthetic Checklist: ,, timeout performed, Correct Patient, Correct Site, Correct Laterality, Correct Procedure, Correct Position, site marked, Risks and benefits discussed,  Surgical consent,  Pre-op evaluation,  At surgeon's request and post-op pain management  Laterality: Left  Prep: chloraprep       Needles:  Injection technique: Single-shot  Needle Type: Echogenic Needle     Needle Length: 9cm 9 cm Needle Gauge: 21 and 21 G    Additional Needles:  Procedures: ultrasound guided (picture in chart) Pectoralis block Narrative:  Start time: 06/17/2016 8:01 AM End time: 06/17/2016 8:12 AM Injection made incrementally with aspirations every 5 mL.  Performed by: Personally  Anesthesiologist: Ruhani Umland  Additional Notes: Pt tolerated the procedure well.

## 2016-06-17 NOTE — Interval H&P Note (Signed)
History and Physical Interval Note:  06/17/2016 7:30 AM  Charlotte [name]  has presented today for surgery, with the diagnosis of LEFT BREAST CANCER  The various methods of treatment have been discussed with the patient and family. After consideration of risks, benefits and other options for treatment, the patient has consented to  Procedure(s): BREAST LUMPECTOMY WITH AXILLARY LYMPH NODE BIOPSY (Left) as a surgical intervention .  The patient's history has been reviewed, patient examined, no change in status, stable for surgery.  I have reviewed the patient's chart and labs.  Questions were answered to the patient's satisfaction.     TOTH III,Josely Moffat S

## 2016-06-17 NOTE — Op Note (Signed)
06/17/2016  10:00 AM  PATIENT:  Charlotte [name]  82 y.o. female  PRE-OPERATIVE DIAGNOSIS:  LEFT BREAST CANCER  POST-OPERATIVE DIAGNOSIS:  LEFT BREAST CANCER  PROCEDURE:  Procedure(s): BREAST LUMPECTOMY WITH AXILLARY Sentinel LYMPH NODE BIOPSY (Left)  SURGEON:  Surgeon(s) and Role:    * Jovita Kussmaul, MD - Primary  PHYSICIAN ASSISTANT:   ASSISTANTS: none   ANESTHESIA:   general  EBL:  Total I/O In: 800 [I.V.:800] Out: 5 [Blood:5]  BLOOD ADMINISTERED:none  DRAINS: none   LOCAL MEDICATIONS USED:  MARCAINE     SPECIMEN:  Source of Specimen:  left breast tissue and sentinel node  DISPOSITION OF SPECIMEN:  PATHOLOGY  COUNTS:  YES  TOURNIQUET:  * No tourniquets in log *  DICTATION: .Dragon Dictation   After informed consent was obtained the patient was brought to the operating room and placed in the supine position on the operating room table.After adequate induction of general anesthesia the patient's left chest, breast, and axillary area were prepped with ChloraPrep, allowed to dry, and draped in usual sterile manner. An appropriate timeout was performed. Earlier today the patient underwent injection of 1 mCi of technetium sulfur colloid in the subareolar position on the left.The neoprobe was sent to technetium in the left axilla was examined. A hot spot was identified. A small transversely oriented incision was made overlying the hot spot with the 15 blade knife.  The incision was carried through the skin and subcutaneous tissue sharply with electrocautery until the axilla was entered. Using the neoprobe to direct blunt hemostat dissection I was able to identify  A hot lymph node. The lymph node was excised sharply with the electrocautery and the lymphatics were controlled with clips.Ex vivo counts on this node were approximately 600.No other hot or palpable lymph nodes were identified in the left axilla. The deep layer of the wound was then closed with interrupted 3-0 Vicryl  stitches. The skin was then closed with a running 4-0 Monocryl subcuticular stitch.The area was infiltrated with quarter percent Marcaine.Attention was then turned to the left breast.There was a palpable mass in the upper inner quadrant.An elliptical incision was made in the skin overlying the palpable mass with a 15 blade knife. The incision was carried through the skin and subcutaneous tissue sharply with the electrocautery. While palpating the mass the dissection was carried down to the chest wall and the breast tissue was removed from the pectoralis muscle with the pectoralis fascia. Once the specimen was removed it was oriented with the appropriate paint colors.A specimen radiograph was obtained that showed the calcifications and clip to be in the center of the specimen.The specimen was then sent to pathology for further evaluation. An additional superior and inferior margin were taken sharply with the electrocautery and marked with the appropriate paint color. Hemostasis was achieved using the Bovie electrocautery. The wound was irrigated with saline and infiltrated with quarter percent Marcaine. The cavity was marked with clips. The deep layer of the incision was closed with interrupted 3-0 Vicryl stitches. The skin was then closed with interrupted 4-0 Monocryl subcuticular stitches. Dermabond dressings were applied. The patient tolerated the procedure well. At the end of the case all needle sponge and instrument counts were correct. The patient was then awakened and taken to recovery in stable condition.  PLAN OF CARE: Discharge to home after PACU  PATIENT DISPOSITION:  PACU - hemodynamically stable.   Delay start of Pharmacological VTE agent (>24hrs) due to surgical blood loss or risk of  bleeding: not applicable

## 2016-06-17 NOTE — Anesthesia Preprocedure Evaluation (Addendum)
Anesthesia Evaluation  Patient identified by MRN, date of birth, ID band Patient awake    Reviewed: Allergy & Precautions, NPO status , Patient's Chart, lab work & pertinent test results  Airway Mallampati: II   Neck ROM: full    Dental  (+) Dental Advidsory Given   Pulmonary neg pulmonary ROS,    breath sounds clear to auscultation       Cardiovascular hypertension, On Medications  Rhythm:regular Rate:Normal     Neuro/Psych Anxiety    GI/Hepatic   Endo/Other    Renal/GU      Musculoskeletal   Abdominal   Peds  Hematology   Anesthesia Other Findings   Reproductive/Obstetrics                            Anesthesia Physical Anesthesia Plan  ASA: II  Anesthesia Plan: General and Regional   Post-op Pain Management:  Regional for Post-op pain   Induction: Intravenous  Airway Management Planned: LMA  Additional Equipment:   Intra-op Plan:   Post-operative Plan:   Informed Consent: I have reviewed the patients History and Physical, chart, labs and discussed the procedure including the risks, benefits and alternatives for the proposed anesthesia with the patient or authorized representative who has indicated his/her understanding and acceptance.   Dental Advisory Given  Plan Discussed with: Anesthesiologist, CRNA and Surgeon  Anesthesia Plan Comments:        Anesthesia Quick Evaluation

## 2016-06-17 NOTE — Transfer of Care (Signed)
Immediate Anesthesia Transfer of Care Note  Patient: Charlotte [name]  Procedure(s) Performed: Procedure(s): BREAST LUMPECTOMY WITH AXILLARY LYMPH NODE BIOPSY (Left)  Patient Location: PACU  Anesthesia Type:General  Level of Consciousness: awake, alert  and oriented  Airway & Oxygen Therapy: Patient Spontanous Breathing and Patient connected to nasal cannula oxygen  Post-op Assessment: Report given to RN, Post -op Vital signs reviewed and stable and Patient moving all extremities X 4  Post vital signs: Reviewed and stable  Last Vitals:  Filed Vitals:   06/17/16 0706  BP: 154/50  Pulse: 59  Temp: 36.7 C  Resp: 18    Last Pain: There were no vitals filed for this visit.       Complications: No apparent anesthesia complications

## 2016-06-17 NOTE — H&P (Signed)
Charlotte [name]. Charlotte [name]  Location: [facility] Patient #: [id number] DOB: September 16, 1928 Divorced / Language: English / Race: Black or African American Female   History of Present Illness  The patient is a 82 year old female who presents with breast cancer. We are asked to see the patient in consultation by Dr. Jetta Lout to evaluate her for a new left breast cancer. The patient is an 82 year old black female who recently felt a mass in the upper-inner quadrant of the left breast. This was not seen very well by mammogram but could be seen by ultrasound. It measured about 2.9 cm. This was biopsied and came back as an invasive lobular cancer with ductal carcinoma in situ. It was ER positive, PR negative, and HER-2 negative with a Ki-67 of 15%. She states that she has had a lot of anxiety over the last few months. She has lost about 20 pounds over the last month.   Allergies Latex Exam Gloves *MEDICAL DEVICES AND SUPPLIES* Ibuprofen *ANALGESICS - ANTI-INFLAMMATORY* Valdecoxib *ANALGESICS - ANTI-INFLAMMATORY*  Medication History Escitalopram Oxalate (10MG Tablet, Oral) Active. HydrALAZINE HCl (50MG Tablet, Oral) Active. Restasis (0.05% Emulsion, Ophthalmic) Active. Toviaz (4MG Tablet ER 24HR, Oral) Active. Felodipine ER (5MG Tablet ER 24HR, Oral) Active. Losartan Potassium (100MG Tablet, Oral) Active. Tretinoin (0.025% Cream, External) Active. Medications Reconciled    Review of Systems  General Present- Weight Loss > 10lbs.. Not Present- Appetite Loss, Chills, Fatigue, Fever, Night Sweats, Weight Gain and Weight Loss. Skin Not Present- Change in Wart/Mole, Dryness, Hives, Jaundice, New Lesions, Non-Healing Wounds, Rash and Ulcer. HEENT Not Present- Earache, Hearing Loss, Hoarseness, Nose Bleed, Oral Ulcers, Ringing in the Ears, Seasonal Allergies, Sinus Pain, Sore Throat, Visual Disturbances, Wears glasses/contact lenses and Yellow Eyes. Respiratory Not Present- Bloody  sputum, Chronic Cough, Difficulty Breathing, Snoring and Wheezing. Breast Not Present- Breast Mass, Breast Pain, Nipple Discharge and Skin Changes. Cardiovascular Not Present- Chest Pain, Difficulty Breathing Lying Down, Leg Cramps, Palpitations, Rapid Heart Rate, Shortness of Breath and Swelling of Extremities. Gastrointestinal Not Present- Abdominal Pain, Bloating, Bloody Stool, Change in Bowel Habits, Chronic diarrhea, Constipation, Difficulty Swallowing, Excessive gas, Gets full quickly at meals, Hemorrhoids, Indigestion, Nausea, Rectal Pain and Vomiting. Female Genitourinary Not Present- Frequency, Nocturia, Painful Urination, Pelvic Pain and Urgency. Musculoskeletal Not Present- Back Pain, Joint Pain, Joint Stiffness, Muscle Pain, Muscle Weakness and Swelling of Extremities. Neurological Not Present- Decreased Memory, Fainting, Headaches, Numbness, Seizures, Tingling, Tremor, Trouble walking and Weakness. Psychiatric Present- Anxiety. Not Present- Bipolar, Change in Sleep Pattern, Depression, Fearful and Frequent crying. Endocrine Not Present- Cold Intolerance, Excessive Hunger, Hair Changes, Heat Intolerance, Hot flashes and New Diabetes. Hematology Not Present- Easy Bruising, Excessive bleeding, Gland problems, HIV and Persistent Infections.  Vitals Weight: 121 lb Height: 63in Body Surface Area: 1.56 m Body Mass Index: 21.43 kg/m  Temp.: 98.53F(Temporal)  Pulse: 83 (Regular)  BP: 140/80 (Sitting, Left Arm, Standard)       Physical Exam General Mental Status-Alert. General Appearance-Consistent with stated age. Hydration-Well hydrated. Voice-Normal.  Head and Neck Head-normocephalic, atraumatic with no lesions or palpable masses. Trachea-midline. Thyroid Gland Characteristics - normal size and consistency.  Eye Eyeball - Bilateral-Extraocular movements intact. Sclera/Conjunctiva - Bilateral-No scleral icterus.  Chest and Lung Exam Chest  and lung exam reveals -quiet, even and easy respiratory effort with no use of accessory muscles and on auscultation, normal breath sounds, no adventitious sounds and normal vocal resonance. Inspection Chest Wall - Normal. Back - normal.  Breast Note: There is a 2 cm palpable mobile  mass in the upper inner left breast. There is no palpable mass in the right breast. There is no palpable axillary, supraclavicular, or cervical lymphadenopathy.   Cardiovascular Cardiovascular examination reveals -normal heart sounds, regular rate and rhythm with no murmurs and normal pedal pulses bilaterally.  Abdomen Inspection Inspection of the abdomen reveals - No Hernias. Skin - Scar - no surgical scars. Palpation/Percussion Palpation and Percussion of the abdomen reveal - Soft, Non Tender, No Rebound tenderness, No Rigidity (guarding) and No hepatosplenomegaly. Auscultation Auscultation of the abdomen reveals - Bowel sounds normal.  Neurologic Neurologic evaluation reveals -alert and oriented x 3 with no impairment of recent or remote memory. Mental Status-Normal.  Musculoskeletal Normal Exam - Left-Upper Extremity Strength Normal and Lower Extremity Strength Normal. Normal Exam - Right-Upper Extremity Strength Normal and Lower Extremity Strength Normal.  Lymphatic Head & Neck  General Head & Neck Lymphatics: Bilateral - Description - Normal. Axillary  General Axillary Region: Bilateral - Description - Normal. Tenderness - Non Tender. Femoral & Inguinal  Generalized Femoral & Inguinal Lymphatics: Bilateral - Description - Normal. Tenderness - Non Tender.    Assessment & Plan  BREAST CANCER OF UPPER-INNER QUADRANT OF LEFT FEMALE BREAST (C50.212) Impression: The patient appears to have a stage II cancer of the upper inner left breast. I have talked her in detail about the different options for treatment and at this point she favors breast conservation. I think this would be a  reasonable way to treat her breast cancer. She would also be a good candidate for sentinel node mapping. I have discussed with her in detail the risks and benefits of the operation to do this as well as some of the technical aspects and she understands and wishes to proceed. I will also refer her to medical and radiation oncology to talk about adjuvant therapy. Current Plans Referred to Oncology, for evaluation and follow up (Oncology). Routine. Pt Education - Breast Cancer: discussed with patient and provided information.

## 2016-06-18 ENCOUNTER — Encounter (HOSPITAL_COMMUNITY): Payer: Self-pay | Admitting: General Surgery

## 2016-06-18 NOTE — Anesthesia Postprocedure Evaluation (Signed)
Anesthesia Post Note  Patient: Charlotte [name]  Procedure(s) Performed: Procedure(s) (LRB): BREAST LUMPECTOMY WITH AXILLARY LYMPH NODE BIOPSY (Left)  Patient location during evaluation: PACU Anesthesia Type: General Level of consciousness: awake and alert and patient cooperative Pain management: pain level controlled Vital Signs Assessment: post-procedure vital signs reviewed and stable Respiratory status: spontaneous breathing and respiratory function stable Cardiovascular status: stable Anesthetic complications: no    Last Vitals:  Filed Vitals:   06/17/16 1145 06/17/16 1150  BP:  168/70  Pulse: 48 64  Temp: 36.5 C   Resp: 11 16    Last Pain:  Filed Vitals:   06/17/16 1151  PainSc: [place]

## 2016-07-01 ENCOUNTER — Ambulatory Visit: Payer: Medicare Other | Admitting: Radiation Oncology

## 2016-07-01 ENCOUNTER — Encounter: Payer: Self-pay | Admitting: Hematology and Oncology

## 2016-07-01 ENCOUNTER — Telehealth: Payer: Self-pay | Admitting: Hematology and Oncology

## 2016-07-01 ENCOUNTER — Ambulatory Visit: Payer: Medicare Other

## 2016-07-01 ENCOUNTER — Ambulatory Visit (HOSPITAL_BASED_OUTPATIENT_CLINIC_OR_DEPARTMENT_OTHER): Payer: Medicare Other | Admitting: Hematology and Oncology

## 2016-07-01 VITALS — BP 167/65 | HR 78 | Temp 98.4°F | Resp 18 | Ht 63.0 in | Wt 123.3 lb

## 2016-07-01 DIAGNOSIS — C50412 Malignant neoplasm of upper-outer quadrant of left female breast: Secondary | ICD-10-CM

## 2016-07-01 DIAGNOSIS — Z170 Estrogen receptor positive status [ER+]: Secondary | ICD-10-CM | POA: Diagnosis not present

## 2016-07-01 DIAGNOSIS — Z79899 Other long term (current) drug therapy: Secondary | ICD-10-CM

## 2016-07-01 DIAGNOSIS — C50212 Malignant neoplasm of upper-inner quadrant of left female breast: Secondary | ICD-10-CM | POA: Insufficient documentation

## 2016-07-01 DIAGNOSIS — Z5181 Encounter for therapeutic drug level monitoring: Secondary | ICD-10-CM

## 2016-07-01 MED ORDER — LIDOCAINE-PRILOCAINE 2.5-2.5 % EX CREA: TOPICAL_CREAM | CUTANEOUS | Status: DC

## 2016-07-01 NOTE — Progress Notes (Signed)
[facility] CONSULT NOTE  Patient Care Team: Rosita Fire, MD as PCP - General (Internal Medicine)  CHIEF COMPLAINTS/PURPOSE OF CONSULTATION:  Newly diagnosed breast cancer  HISTORY OF PRESENTING ILLNESS:  Charlotte [name] 82 y.o. female is here because of recent diagnosis of left breast cancer. Patient was feeling poorly for the past year and finally underwent a CT of the chest to evaluate for some shortness of breath. The CT revealed a 2.4 cm infraclavicular mass. 6 months ago she had a mammogram which was negative. When the mass was identified she underwent an ultrasound that revealed a 2.9 similar mass in axilla was negative. She had a repeat mammogram which confirmed the presence of the breast tumor. She underwent ultrasound-guided biopsy which revealed a grade 2 invasive ductal carcinoma. This was ER positive PR negative and HER-2 positive. She underwent a lumpectomy on 06/17/2016 that revealed grade 2 invasive ductal carcinoma 2.4 cm in size 0 of 4 lymph nodes negative there was lymphovascular invasion and the tumor was ER positive PR negative HER-2 positive with a ratio of 2.21 Ki-67 15%. She was presented this morning to the multidisciplinary tumor board and she is here today to discuss a treatment plan. She is recovering fairly well from recent surgery. Patient previously had right lumpectomy in 2000 followed by radiation what appears to be DCIS at that time.  I reviewed her records extensively and collaborated the history with the patient.  SUMMARY OF ONCOLOGIC HISTORY:   Breast cancer of upper-outer quadrant of left female breast ([place])   06/02/2016 Initial Diagnosis Left breast biopsy 11:00 position: IDC with DCIS, ER 60%, PR 0%, Ki-67 15%, HER-2 negative ratio 1.4 to   06/17/2016 Surgery Left lumpectomy: IDC grade 2, 2.4 cm, with intermediate grade DCIS, LVID present, margins negative, 0/4 lymph nodes positive, ER 60 pleasant, PR 0%, HER-2 negative ratio 1.42, Ki-67 15%, T2  N0 stage II a   MEDICAL HISTORY:  Past Medical History  Diagnosis Date  . Hypertension   . Cancer [facility])     breast cancer  . Fatigue   . Anxiety   . Insomnia   . History of hyperthyroidism     resolved, no medicaions for treatment at this time  . Arthritis     SURGICAL HISTORY: Past Surgical History  Procedure Laterality Date  . Breast lumpectomy      right side  . Tonsillectomy    . Dilation and curettage of uterus    . Foot surgery    . Colonoscopy    . Eye surgery      bilateral cataracts removed  . Breast lumpectomy with axillary lymph node biopsy Left 06/17/2016    Procedure: BREAST LUMPECTOMY WITH AXILLARY LYMPH NODE BIOPSY;  Surgeon: Autumn Messing III, MD;  Location: [place] [facility];  Service: General;  Laterality: Left;    SOCIAL HISTORY: Social History   Social History  . Marital Status: Married    Spouse Name: N/A  . Number of Children: N/A  . Years of Education: N/A   Occupational History  . Not on file.   Social History Main Topics  . Smoking status: Never Smoker   . Smokeless tobacco: Never Used  . Alcohol Use: No  . Drug Use: No  . Sexual Activity: Not on file   Other Topics Concern  . Not on file   Social History Narrative    FAMILY HISTORY: Family History  Problem Relation Age of Onset  . Heart attack Mother   .  Cancer Mother     ALLERGIES:  is allergic to latex; motrin; and bextra.  MEDICATIONS:  Current Outpatient Prescriptions  Medication Sig Dispense Refill  . acetaminophen (TYLENOL) 500 MG tablet Take 1,000 mg by mouth daily as needed for mild pain. Reported on 01/27/2016    . aspirin EC 81 MG tablet Take 81 mg by mouth daily.    . Calcium Carbonate-Vitamin D (CALCIUM 600+D3 PO) Take 1 tablet by mouth 2 (two) times daily.    . carbamide peroxide (DEBROX) 6.5 % otic solution Place 1 drop into both ears daily as needed (ear wax buildup).    . cycloSPORINE (RESTASIS) 0.05 % ophthalmic emulsion Place 1 drop into both eyes 2 (two) times daily.     . diphenhydramine-acetaminophen (TYLENOL PM) 25-500 MG TABS tablet Take 1 tablet by mouth at bedtime as needed (sleep).    . felodipine (PLENDIL) 5 MG 24 hr tablet Take 5 mg by mouth daily.     . fluticasone (FLONASE) 50 MCG/ACT nasal spray Place 2 sprays into the nose daily as needed for allergies.    . hydrALAZINE (APRESOLINE) 50 MG tablet Take 1 tablet (50 mg total) by mouth 2 (two) times daily. 180 tablet 3  . HYDROcodone-acetaminophen (NORCO) 5-325 MG tablet Take 1-2 tablets by mouth every 6 (six) hours as needed. 30 tablet 0  . lidocaine-prilocaine (EMLA) cream Apply to affected area once 30 g 3  . Liniments (SALONPAS PAIN RELIEF PATCH) PADS Apply 1 each topically daily as needed (pain).    . LORazepam (ATIVAN) 1 MG tablet Take 0.5 mg by mouth 3 (three) times daily.    Marland Kitchen losartan (COZAAR) 100 MG tablet Take 100 mg by mouth daily.    . Melatonin 5 MG TABS Take 5 mg by mouth at bedtime as needed (sleep).     . Multiple Vitamins-Minerals (CENTRUM ADULTS PO) Take 1 tablet by mouth daily.    . Omega-3 Fatty Acids (FISH OIL TRIPLE STRENGTH) 1400 MG CAPS Take 1 tablet by mouth daily.    Marland Kitchen tretinoin (RETIN-A) 0.025 % cream Apply 1 application topically at bedtime.   3   No current facility-administered medications for this visit.    REVIEW OF SYSTEMS:   Constitutional: Denies fevers, chills or abnormal night sweats Eyes: Denies blurriness of vision, double vision or watery eyes Ears, nose, mouth, throat, and face: Denies mucositis or sore throat Respiratory: Denies cough, dyspnea or wheezes Cardiovascular: Denies palpitation, chest discomfort or lower extremity swelling Gastrointestinal:  Denies nausea, heartburn or change in bowel habits Skin: Denies abnormal skin rashes Lymphatics: Denies new lymphadenopathy or easy bruising Neurological:Denies numbness, tingling or new weaknesses Behavioral/Psych: Mood is stable, no new changes  Breast:Recent left lumpectomy All other systems were  reviewed with the patient and are negative.  PHYSICAL EXAMINATION: ECOG PERFORMANCE STATUS: 1 - Symptomatic but completely ambulatory  Filed Vitals:   07/01/16 1250  BP: 167/65  Pulse: 78  Temp: 98.4 F (36.9 C)  Resp: 18   Filed Weights   07/01/16 1250  Weight: 123 lb 4.8 oz (55.929 kg)    GENERAL:alert, no distress and comfortable SKIN: skin color, texture, turgor are normal, no rashes or significant lesions EYES: normal, conjunctiva are pink and non-injected, sclera clear OROPHARYNX:no exudate, no erythema and lips, buccal mucosa, and tongue normal  NECK: supple, thyroid normal size, non-tender, without nodularity LYMPH:  no palpable lymphadenopathy in the cervical, axillary or inguinal LUNGS: clear to auscultation and percussion with normal breathing effort HEART: regular rate &  rhythm and no murmurs and no lower extremity edema ABDOMEN:abdomen soft, non-tender and normal bowel sounds Musculoskeletal:no cyanosis of digits and no clubbing  PSYCH: alert & oriented x 3 with fluent speech NEURO: no focal motor/sensory deficits  LABORATORY DATA:  I have reviewed the data as listed Lab Results  Component Value Date   WBC 5.9 06/16/2016   HGB 12.9 06/16/2016   HCT 39.7 06/16/2016   MCV 87.1 06/16/2016   PLT 366 06/16/2016   Lab Results  Component Value Date   NA 133* 06/16/2016   K 4.1 06/16/2016   CL 97* 06/16/2016   CO2 30 06/16/2016    RADIOGRAPHIC STUDIES: I have personally reviewed the radiological reports and agreed with the findings in the report.  ASSESSMENT AND PLAN:  Breast cancer of upper-outer quadrant of left female breast ([place]) Left lumpectomy 06/17/2016: IDC grade 2, 2.4 cm, with intermediate grade DCIS, LVID present, margins negative, 0/4 lymph nodes positive, ER 60 pleasant, PR 0%, HER-2 negative ratio 1.42, Ki-67 15%, T2 N0 stage II a  Pathology counseling: I discussed the final pathology report of the patient provided  a copy of this report. I  discussed the margins as well as lymph node surgeries. We also discussed the final staging along with previously performed ER/PR and HER-2/neu testing.  Recommendation is based on multidisciplinary tumor board: 1. Adjuvant Herceptin every 3 weeks for 1 year with anastrozole 2. Followed by adjuvant radiation 3. Followed by adjuvant antiestrogen therapy.  Patient really need a port, echo, chemotherapy class every plan to start chemotherapy on 07/21/2016. I will request the patient's cardiologist Dr. Carlyle Dolly to do her echocardiograms every 3 months for 1 year   All questions were answered. The patient knows to call the clinic with any problems, questions or concerns.    Rulon Eisenmenger, MD 07/01/2016

## 2016-07-01 NOTE — Telephone Encounter (Signed)
appt made and avs printed °

## 2016-07-01 NOTE — Progress Notes (Signed)
Location of Breast Cancer: Left Breast Cancer Upper Inner Quadrant  Histology per Pathology Report: Diagnosis :06/02/16: Breast, left, needle core biopsy, 11:00 - INVASIVE AND IN SITU MAMMARY CARCINOMA.   Receptor Status:6/28/217: HER(+) ratio =2.21   06/02/2016: ER(+), PR (neg ), Her2-neu (neg), Ki-67(15%)  Did patient present with symptoms (if so, please note symptoms) or was this found on screening mammography?: Patient felt  A lump herself,  Past/Anticipated interventions by surgeon, if GEX:[id number] 06/17/2016:Dr. Autumn Messing, III ,MD 1. Lymph node, sentinel, biopsy, Left #, - THERE IS NO EVIDENCE OF CARCINOMA IN 1 OF 1 LYMPH NODE (0/1). 2. Lymph node, sentinel, biopsy, Left #2 - THERE IS NO EVIDENCE OF CARCINOMA IN 1 OF 1 LYMPH NODE (0/1). 3. Lymph node, sentinel, biopsy, Left #3 - THERE IS NO EVIDENCE OF CARCINOMA IN 1 OF 1 LYMPH NODE (0/1). 4. Lymph node, sentinel, biopsy, Left #4 - THERE IS NO EVIDENCE OF CARCINOMA IN 1 OF 1 LYMPH NODE (0/1). 5. Breast, lumpectomy, Left - INVASIVE DUCTAL CARCINOMA WITH CALCIFICATIONS, GRADE II/III, SPANNING 2.4 CM.- DUCTAL CARCINOMA IN SITU WITH CALCIFICATIONS, INTERMEDIATE GRADE. 1 of 4 Duplicate copy FINAL for ARYAH, DOERING [phone]) Diagnosis(continued) - INVASIVE CARCINOMA IS FOCALLY LESS THAN 0.1 CM TO THE POSTERIOR MARGIN OF SPECIMEN #5 AND FOCALLY LESS THAN 0.1 CM TO THE SUPERIOR MARGIN OF SPECIMEN #5. - LYMPHOVASCULAR INVASION IS IDENTIFIED. - SEE ONCOLOGY TABLE BELOW. 6. Breast, excision, Left additional inferior margin - BENIGN BREAST PARENCHYMA.- THERE IS NO EVIDENCE OF MALIGNANCY.- SEE COMMENT. 7. Breast, excision, Left additional superior margin - BENIGN BREAST PARENCHYMA. - THERE IS NO EVIDENCE OF MALIGNANCY.- SEE COMMENT.   Past/Anticipated interventions by medical oncology, if any: Chemotherapy :appt with Dr. Lindi Adie 07/01/16   Lymphedema issues, if any:   Pain issues, if any:  No  Incision top of left chest  And under  axilla  Well healed, glue still on areas,   SAFETY ISSUES: NO  Prior radiation?  Yes, radiation  Right breast 2000, [place]   Pacemaker/ICD? NO  Possible current pregnancy? N/A  Is the patient on methotrexate? NO  Current Complaints / other details:   Married, 2 children,  Anxiety ,Hx previous hx right lumpectomy 2000   Allergies:Latex, Ibuprofen,Valdecoxib      Rebecca Eaton, RN 07/01/2016,7:41 AM BP 158/65 mmHg  Pulse 74  Temp(Src) 97.8 F (36.6 C) (Oral)  Resp 16  Ht 5' 3"  (1.6 m)  Wt 125 lb 12.8 oz (57.063 kg)  BMI 22.29 kg/m2 Wt Readings from Last 3 Encounters:  07/02/16 125 lb 12.8 oz (57.063 kg)  07/01/16 123 lb 4.8 oz (55.929 kg)  06/17/16 121 lb (54.885 kg)

## 2016-07-01 NOTE — Assessment & Plan Note (Signed)
Left lumpectomy 06/17/2016: IDC grade 2, 2.4 cm, with intermediate grade DCIS, LVID present, margins negative, 0/4 lymph nodes positive, ER 60 pleasant, PR 0%, HER-2 negative ratio 1.42, Ki-67 15%, T2 N0 stage II a  Pathology counseling: I discussed the final pathology report of the patient provided  a copy of this report. I discussed the margins as well as lymph node surgeries. We also discussed the final staging along with previously performed ER/PR and HER-2/neu testing.  Recommendation is based on multidisciplinary tumor board: 1. Adjuvant Herceptin every 3 weeks for 1 year with anastrozole 2. Followed by adjuvant radiation 3. Followed by adjuvant antiestrogen therapy.  Patient really need a port, echo, chemotherapy class every plan to start chemotherapy on 07/21/2016. I will request the patient's cardiologist Dr. Carlyle Dolly to do her echocardiograms every 3 months for 1 year

## 2016-07-02 ENCOUNTER — Encounter: Payer: Self-pay | Admitting: *Deleted

## 2016-07-02 ENCOUNTER — Encounter: Payer: Self-pay | Admitting: Radiation Oncology

## 2016-07-02 ENCOUNTER — Ambulatory Visit
Admission: RE | Admit: 2016-07-02 | Discharge: 2016-07-02 | Disposition: A | Discharge status: Home / Self Care | Payer: Medicare Other | Source: Ambulatory Visit | Attending: Radiation Oncology | Admitting: Radiation Oncology

## 2016-07-02 VITALS — BP 158/65 | HR 74 | Temp 97.8°F | Resp 16 | Ht 63.0 in | Wt 125.8 lb

## 2016-07-02 DIAGNOSIS — Z853 Personal history of malignant neoplasm of breast: Secondary | ICD-10-CM | POA: Diagnosis not present

## 2016-07-02 DIAGNOSIS — Z7982 Long term (current) use of aspirin: Secondary | ICD-10-CM | POA: Diagnosis not present

## 2016-07-02 DIAGNOSIS — Z170 Estrogen receptor positive status [ER+]: Secondary | ICD-10-CM | POA: Insufficient documentation

## 2016-07-02 DIAGNOSIS — Z809 Family history of malignant neoplasm, unspecified: Secondary | ICD-10-CM | POA: Diagnosis not present

## 2016-07-02 DIAGNOSIS — Z888 Allergy status to other drugs, medicaments and biological substances status: Secondary | ICD-10-CM | POA: Insufficient documentation

## 2016-07-02 DIAGNOSIS — M1990 Unspecified osteoarthritis, unspecified site: Secondary | ICD-10-CM | POA: Insufficient documentation

## 2016-07-02 DIAGNOSIS — C50412 Malignant neoplasm of upper-outer quadrant of left female breast: Secondary | ICD-10-CM

## 2016-07-02 DIAGNOSIS — Z91040 Latex allergy status: Secondary | ICD-10-CM | POA: Insufficient documentation

## 2016-07-02 DIAGNOSIS — Z79899 Other long term (current) drug therapy: Secondary | ICD-10-CM | POA: Diagnosis not present

## 2016-07-02 DIAGNOSIS — Z923 Personal history of irradiation: Secondary | ICD-10-CM | POA: Insufficient documentation

## 2016-07-02 DIAGNOSIS — Z8249 Family history of ischemic heart disease and other diseases of the circulatory system: Secondary | ICD-10-CM | POA: Diagnosis not present

## 2016-07-02 DIAGNOSIS — C50212 Malignant neoplasm of upper-inner quadrant of left female breast: Secondary | ICD-10-CM | POA: Diagnosis not present

## 2016-07-02 DIAGNOSIS — Z510 Encounter for antineoplastic radiation therapy: Secondary | ICD-10-CM | POA: Insufficient documentation

## 2016-07-02 DIAGNOSIS — I10 Essential (primary) hypertension: Secondary | ICD-10-CM | POA: Diagnosis not present

## 2016-07-02 NOTE — Progress Notes (Signed)
Please see the Nurse Progress Note in the MD Initial Consult Encounter for this patient. 

## 2016-07-02 NOTE — Progress Notes (Signed)
Radiation Oncology         [phone]) [phone] ________________________________  Name: Charlotte [name] MRN: [id number]  Date: 07/02/2016  DOB: [birth year]  RA:QTMAU,QJFHLKT, MD  Rosita Fire, MD     REFERRING PHYSICIAN: Rosita Fire, MD   DIAGNOSIS: The encounter diagnosis was Breast cancer of upper-outer quadrant of left female breast ([place]).   HISTORY OF PRESENT ILLNESS::Charlotte [name] is a 82 y.o. female who is seen for an initial consultation visit regarding the patient's diagnosis of breast cancer.  The patient was found to have suspicious findings within the left breast on initial mammogram. The patient has had symptoms prior to this study: when she was feeling poorly for the past year and presented to evaluate shortness of breath. A Chest CT revealed a 2.4 cm infraclavicular mass. A diagnostic mammogram was performed which confirmed the presence of the breast tumor. Accordingly an ultrasound was performed showing the tumor measured 2.9 cm and was present in the upper-outer quadrant with pleomorphic calcifications.  A biopsy was performed on 06/02/2016. This revealed invasive and in situ mammary carcinoma.   The patient has not undergone an MRI scan of the breasts.  The patient proceeded to undergo surgical resection of the breast cancer on 06/17/2016. This corresponded to a invasive ductal carcinoma with calcifications Grade II/III and DCIS with calcifications intermediate grade. Pathology revealed a tumor that measured 2.4 cm. The margins were focally less than 0.1 cm to the posterior margin. In terms of lymph nodes, 0/4 were postive. Receptor studies indicated that the tumor is estrogen receptor 60%, progesterone receptor negative, and HER-2/neu postive. The Ki-67 staining was 15 %.  An Oncotype test was not ordered.     PREVIOUS RADIATION THERAPY: Yes ; in [facility] at [facility] to the Right breast for early stage breast cancer   PAST MEDICAL HISTORY:  has a past medical history of  Hypertension; Cancer ([place]); Fatigue; Anxiety; Insomnia; History of hyperthyroidism; and Arthritis.     PAST SURGICAL HISTORY: Past Surgical History  Procedure Laterality Date  . Breast lumpectomy      right side  . Tonsillectomy    . Dilation and curettage of uterus    . Foot surgery    . Colonoscopy    . Eye surgery      bilateral cataracts removed  . Breast lumpectomy with axillary lymph node biopsy Left 06/17/2016    Procedure: BREAST LUMPECTOMY WITH AXILLARY LYMPH NODE BIOPSY;  Surgeon: Autumn Messing III, MD;  Location: [facility];  Service: General;  Laterality: Left;     FAMILY HISTORY: family history includes Cancer in her mother; Heart attack in her mother.   SOCIAL HISTORY:  reports that she has never smoked. She has never used smokeless tobacco. She reports that she does not drink alcohol or use illicit drugs.   ALLERGIES: Latex; Motrin; and Bextra   MEDICATIONS:  Current Outpatient Prescriptions  Medication Sig Dispense Refill  . acetaminophen (TYLENOL) 500 MG tablet Take 1,000 mg by mouth daily as needed for mild pain. Reported on 01/27/2016    . aspirin EC 81 MG tablet Take 81 mg by mouth daily.    . Calcium Carbonate-Vitamin D (CALCIUM 600+D3 PO) Take 1 tablet by mouth 2 (two) times daily.    . carbamide peroxide (DEBROX) 6.5 % otic solution Place 1 drop into both ears daily as needed (ear wax buildup).    . cycloSPORINE (RESTASIS) 0.05 % ophthalmic emulsion Place 1 drop into both eyes 2 (two) times daily.    Marland Kitchen  diphenhydramine-acetaminophen (TYLENOL PM) 25-500 MG TABS tablet Take 1 tablet by mouth at bedtime as needed (sleep).    . felodipine (PLENDIL) 5 MG 24 hr tablet Take 5 mg by mouth daily.     . fluticasone (FLONASE) 50 MCG/ACT nasal spray Place 2 sprays into the nose daily as needed for allergies.    . hydrALAZINE (APRESOLINE) 50 MG tablet Take 1 tablet (50 mg total) by mouth 2 (two) times daily. 180 tablet 3  . HYDROcodone-acetaminophen (NORCO) 5-325 MG tablet Take  1-2 tablets by mouth every 6 (six) hours as needed. 30 tablet 0  . lidocaine-prilocaine (EMLA) cream Apply to affected area once 30 g 3  . Liniments (SALONPAS PAIN RELIEF PATCH) PADS Apply 1 each topically daily as needed (pain).    . LORazepam (ATIVAN) 1 MG tablet Take 0.5 mg by mouth 3 (three) times daily.    Marland Kitchen losartan (COZAAR) 100 MG tablet Take 100 mg by mouth daily.    . Melatonin 5 MG TABS Take 5 mg by mouth at bedtime as needed (sleep).     . Multiple Vitamins-Minerals (CENTRUM ADULTS PO) Take 1 tablet by mouth daily.    . Omega-3 Fatty Acids (FISH OIL TRIPLE STRENGTH) 1400 MG CAPS Take 1 tablet by mouth daily.    Marland Kitchen tretinoin (RETIN-A) 0.025 % cream Apply 1 application topically at bedtime.   3   No current facility-administered medications for this encounter.     REVIEW OF SYSTEMS:  A 15 point review of systems is documented in the electronic medical record. This was obtained by the nursing staff. However, I reviewed this with the patient to discuss relevant findings and make appropriate changes.  Pertinent items are noted in HPI.   The patient is accompanied by her husband and daughter. History of right lumpectomy in 2000 with Dr. Tamala Julian and hormone treatment with Dr. Tressie Stalker. She will follow up with Dr. Marlou Starks on 07/07/2016. She is scheduled to begin Herceptin on 07/21/2016 with Dr. Lindi Adie. She has not been given any medication for infection, notes she was given some pain medication but has only taken it a few times.  PHYSICAL EXAM:  height is 5' 3"  (1.6 m) and weight is 125 lb 12.8 oz (57.063 kg). Her oral temperature is 97.8 F (36.6 C). Her blood pressure is 158/65 and her pulse is 74. Her respiration is 16.   ECOG = 1  0 - Asymptomatic (Fully active, able to carry on all predisease activities without restriction)  1 - Symptomatic but completely ambulatory (Restricted in physically strenuous activity but ambulatory and able to carry out work of a light or sedentary nature. For  example, light housework, office work)  2 - Symptomatic, <50% in bed during the day (Ambulatory and capable of all self care but unable to carry out any work activities. Up and about more than 50% of waking hours)  3 - Symptomatic, >50% in bed, but not bedbound (Capable of only limited self-care, confined to bed or chair 50% or more of waking hours)  4 - Bedbound (Completely disabled. Cannot carry on any self-care. Totally confined to bed or chair)  5 - Death   Eustace Pen MM, Creech RH, Tormey DC, et al. [phone]). "Toxicity and response criteria of the [facility]". [place] Oncol. 5 (6): 649-55  General: Well-developed, in no acute distress HEENT: Normocephalic, atraumatic; oral cavity clear Neck: Supple without any lymphadenopathy Cardiovascular: Regular rate and rhythm Respiratory: Clear to auscultation bilaterally Breasts: Well healing incision  in the far medial / superior aspect of the upper-inner quadrant of the left breast. No suspicious masses or lymphadenopathy. Patient has radiation tattoos from her prior treatment. Skin in the region of the right breast shows no significant effects from prior radiation treatment. GI: Soft, nontender, normal bowel sounds Extremities: No edema present Neuro: No focal deficits    LABORATORY DATA:  Lab Results  Component Value Date   WBC 5.9 06/16/2016   HGB 12.9 06/16/2016   HCT 39.7 06/16/2016   MCV 87.1 06/16/2016   PLT 366 06/16/2016   Lab Results  Component Value Date   NA 133* 06/16/2016   K 4.1 06/16/2016   CL 97* 06/16/2016   CO2 30 06/16/2016   Lab Results  Component Value Date   ALT 34 11/04/2015   AST 27 11/04/2015   ALKPHOS 55 11/04/2015   BILITOT 0.6 11/04/2015      RADIOGRAPHY: Nm Sentinel Node Inj-no Rpt (breast)  06/17/2016  CLINICAL DATA: left breast cancer Sulfur colloid was injected intradermally by the nuclear medicine technologist for breast cancer sentinel node localization.        IMPRESSION:    Breast cancer of upper-outer quadrant of left female breast ([place])   06/02/2016 Initial Diagnosis Left breast biopsy 11:00 position: IDC with DCIS, ER 60%, PR 0%, Ki-67 15%, HER-2 negative ratio 1.4 to   06/17/2016 Surgery Left lumpectomy: IDC grade 2, 2.4 cm, with intermediate grade DCIS, LVID present, margins negative, 0/4 lymph nodes positive, ER 60 pleasant, PR 0%, HER-2 negative ratio 1.42, Ki-67 15%, T2 N0 stage II a    The patient has a recent diagnosis of invasive ductal carcinoma and DCIS of the Left breast. She has completed surgery and appears to be doing satisfactorily postoperatively. Notably, the patient's tumor was very superior and medial.  I discussed with the patient the role of adjuvant radiation treatment in this setting. We discussed the potential benefit of radiation treatment, especially with regards to local control of the patient's tumor. We also discussed the possible side effects and risks of such a treatment as well. I believe that it would be reasonable to proceed with adjuvant radiation treatment given her presentation including the size of the tumor and close margins. Also notable is the fact that the patient previously received radiation treatment to the right breast in [place].  All of the patient's questions were answered. The patient wishes to proceed with radiation treatment at the appropriate time.  PLAN: The patient will proceed with simulation, and this will be completed next Wednesday, 07/08/2016.  I anticipate treating the patient with a course of adjuvant radiation treatment to the Left breast for 4 weeks. The patient is scheduled to begin chemotherapy on 07/21/2016 with Dr. Lindi Adie in the form of Herceptin.      ________________________________   Jodelle Gross, MD, PhD   **Disclaimer: This note was dictated with voice recognition software. Similar sounding words can inadvertently be transcribed and this note may contain transcription errors  which may not have been corrected upon publication of note.**  This document serves as a record of services personally performed by Kyung Rudd, MD. It was created on his behalf by Arlyce Harman, a trained medical scribe. The creation of this record is based on the scribe's personal observations and the provider's statements to them. This document has been checked and approved by the attending provider.

## 2016-07-06 ENCOUNTER — Ambulatory Visit (HOSPITAL_COMMUNITY)
Admission: RE | Admit: 2016-07-06 | Discharge: 2016-07-06 | Disposition: A | Discharge status: Home / Self Care | Payer: Medicare Other | Source: Ambulatory Visit | Attending: Hematology and Oncology | Admitting: Hematology and Oncology

## 2016-07-06 DIAGNOSIS — Z79899 Other long term (current) drug therapy: Secondary | ICD-10-CM | POA: Insufficient documentation

## 2016-07-06 DIAGNOSIS — Z5181 Encounter for therapeutic drug level monitoring: Secondary | ICD-10-CM | POA: Diagnosis not present

## 2016-07-06 DIAGNOSIS — I340 Nonrheumatic mitral (valve) insufficiency: Secondary | ICD-10-CM | POA: Insufficient documentation

## 2016-07-06 DIAGNOSIS — C50412 Malignant neoplasm of upper-outer quadrant of left female breast: Secondary | ICD-10-CM

## 2016-07-06 DIAGNOSIS — I119 Hypertensive heart disease without heart failure: Secondary | ICD-10-CM | POA: Insufficient documentation

## 2016-07-06 DIAGNOSIS — Z09 Encounter for follow-up examination after completed treatment for conditions other than malignant neoplasm: Secondary | ICD-10-CM | POA: Diagnosis present

## 2016-07-06 NOTE — Progress Notes (Signed)
*  PRELIMINARY RESULTS* Echocardiogram 2D Echocardiogram has been performed.  Charlotte [name] 07/06/2016, 9:14 AM

## 2016-07-07 ENCOUNTER — Other Ambulatory Visit: Payer: Self-pay | Admitting: General Surgery

## 2016-07-08 ENCOUNTER — Ambulatory Visit
Admission: RE | Admit: 2016-07-08 | Discharge: 2016-07-08 | Disposition: A | Discharge status: Home / Self Care | Payer: Medicare Other | Source: Ambulatory Visit | Attending: Radiation Oncology | Admitting: Radiation Oncology

## 2016-07-08 DIAGNOSIS — Z510 Encounter for antineoplastic radiation therapy: Secondary | ICD-10-CM | POA: Diagnosis not present

## 2016-07-08 DIAGNOSIS — Z79899 Other long term (current) drug therapy: Secondary | ICD-10-CM | POA: Diagnosis not present

## 2016-07-08 DIAGNOSIS — Z170 Estrogen receptor positive status [ER+]: Secondary | ICD-10-CM | POA: Diagnosis not present

## 2016-07-08 DIAGNOSIS — I10 Essential (primary) hypertension: Secondary | ICD-10-CM | POA: Diagnosis not present

## 2016-07-08 DIAGNOSIS — M1990 Unspecified osteoarthritis, unspecified site: Secondary | ICD-10-CM | POA: Diagnosis not present

## 2016-07-08 DIAGNOSIS — C50212 Malignant neoplasm of upper-inner quadrant of left female breast: Secondary | ICD-10-CM | POA: Diagnosis not present

## 2016-07-09 DIAGNOSIS — C50212 Malignant neoplasm of upper-inner quadrant of left female breast: Secondary | ICD-10-CM | POA: Diagnosis not present

## 2016-07-10 DIAGNOSIS — C50212 Malignant neoplasm of upper-inner quadrant of left female breast: Secondary | ICD-10-CM | POA: Diagnosis not present

## 2016-07-10 DIAGNOSIS — Z170 Estrogen receptor positive status [ER+]: Secondary | ICD-10-CM | POA: Diagnosis not present

## 2016-07-10 DIAGNOSIS — M1990 Unspecified osteoarthritis, unspecified site: Secondary | ICD-10-CM | POA: Diagnosis not present

## 2016-07-10 DIAGNOSIS — Z79899 Other long term (current) drug therapy: Secondary | ICD-10-CM | POA: Diagnosis not present

## 2016-07-10 DIAGNOSIS — I10 Essential (primary) hypertension: Secondary | ICD-10-CM | POA: Diagnosis not present

## 2016-07-10 DIAGNOSIS — Z510 Encounter for antineoplastic radiation therapy: Secondary | ICD-10-CM | POA: Diagnosis not present

## 2016-07-13 ENCOUNTER — Ambulatory Visit: Payer: Self-pay | Admitting: Adult Health

## 2016-07-14 ENCOUNTER — Other Ambulatory Visit: Payer: Medicare Other

## 2016-07-14 ENCOUNTER — Encounter: Payer: Self-pay | Admitting: *Deleted

## 2016-07-14 ENCOUNTER — Encounter (HOSPITAL_COMMUNITY): Payer: Self-pay | Admitting: *Deleted

## 2016-07-14 ENCOUNTER — Other Ambulatory Visit: Payer: Self-pay

## 2016-07-14 MED ORDER — LIDOCAINE-PRILOCAINE 2.5-2.5 % EX CREA: 1.0000 "application " | TOPICAL_CREAM | CUTANEOUS | 1 refills | Status: DC | PRN

## 2016-07-14 NOTE — Progress Notes (Signed)
Pt denies cardiac history, chest pain or sob. 

## 2016-07-15 ENCOUNTER — Ambulatory Visit
Admission: RE | Admit: 2016-07-15 | Discharge: 2016-07-15 | Disposition: A | Discharge status: Home / Self Care | Payer: Medicare Other | Source: Ambulatory Visit | Attending: Radiation Oncology | Admitting: Radiation Oncology

## 2016-07-15 ENCOUNTER — Inpatient Hospital Stay: Admission: RE | Admit: 2016-07-15 | Payer: Medicare Other | Source: Ambulatory Visit

## 2016-07-15 DIAGNOSIS — Z79899 Other long term (current) drug therapy: Secondary | ICD-10-CM | POA: Diagnosis not present

## 2016-07-15 DIAGNOSIS — I10 Essential (primary) hypertension: Secondary | ICD-10-CM | POA: Diagnosis not present

## 2016-07-15 DIAGNOSIS — Z170 Estrogen receptor positive status [ER+]: Secondary | ICD-10-CM | POA: Diagnosis not present

## 2016-07-15 DIAGNOSIS — M1990 Unspecified osteoarthritis, unspecified site: Secondary | ICD-10-CM | POA: Diagnosis not present

## 2016-07-15 DIAGNOSIS — Z510 Encounter for antineoplastic radiation therapy: Secondary | ICD-10-CM | POA: Diagnosis not present

## 2016-07-15 DIAGNOSIS — C50212 Malignant neoplasm of upper-inner quadrant of left female breast: Secondary | ICD-10-CM | POA: Diagnosis not present

## 2016-07-15 NOTE — Anesthesia Preprocedure Evaluation (Signed)
Anesthesia Evaluation  Patient identified by MRN, date of birth, ID band Patient awake    Reviewed: Allergy & Precautions, NPO status , Patient's Chart, lab work & pertinent test results  Airway Mallampati: II   Neck ROM: full    Dental  (+) Dental Advidsory Given   Pulmonary neg pulmonary ROS,    breath sounds clear to auscultation       Cardiovascular hypertension, On Medications  Rhythm:regular Rate:Normal     Neuro/Psych Anxiety    GI/Hepatic   Endo/Other    Renal/GU      Musculoskeletal   Abdominal   Peds  Hematology   Anesthesia Other Findings   Reproductive/Obstetrics                             Lab Results  Component Value Date   WBC 5.9 06/16/2016   HGB 12.9 06/16/2016   HCT 39.7 06/16/2016   MCV 87.1 06/16/2016   PLT 366 06/16/2016   Lab Results  Component Value Date   CREATININE 1.05 (H) 06/16/2016   BUN 8 06/16/2016   NA 133 (L) 06/16/2016   K 4.1 06/16/2016   CL 97 (L) 06/16/2016   CO2 30 06/16/2016    Anesthesia Physical  Anesthesia Plan  ASA: II  Anesthesia Plan: General   Post-op Pain Management:    Induction: Intravenous  Airway Management Planned: LMA  Additional Equipment:   Intra-op Plan:   Post-operative Plan: Extubation in OR  Informed Consent: I have reviewed the patients History and Physical, chart, labs and discussed the procedure including the risks, benefits and alternatives for the proposed anesthesia with the patient or authorized representative who has indicated his/her understanding and acceptance.   Dental advisory given  Plan Discussed with:   Anesthesia Plan Comments:        Anesthesia Quick Evaluation

## 2016-07-16 ENCOUNTER — Encounter (HOSPITAL_COMMUNITY): Payer: Self-pay | Admitting: Surgery

## 2016-07-16 ENCOUNTER — Encounter (HOSPITAL_COMMUNITY)
Admission: RE | Disposition: A | Discharge status: Home / Self Care | Payer: Self-pay | Source: Ambulatory Visit | Attending: General Surgery

## 2016-07-16 ENCOUNTER — Ambulatory Visit (HOSPITAL_COMMUNITY)
Admission: RE | Admit: 2016-07-16 | Discharge: 2016-07-16 | Disposition: A | Discharge status: Home / Self Care | Payer: Medicare Other | Source: Ambulatory Visit | Attending: General Surgery | Admitting: General Surgery

## 2016-07-16 ENCOUNTER — Ambulatory Visit (HOSPITAL_COMMUNITY): Payer: Medicare Other

## 2016-07-16 ENCOUNTER — Ambulatory Visit (HOSPITAL_COMMUNITY): Payer: Medicare Other | Admitting: Anesthesiology

## 2016-07-16 ENCOUNTER — Ambulatory Visit: Payer: Medicare Other

## 2016-07-16 DIAGNOSIS — I10 Essential (primary) hypertension: Secondary | ICD-10-CM | POA: Insufficient documentation

## 2016-07-16 DIAGNOSIS — C50212 Malignant neoplasm of upper-inner quadrant of left female breast: Secondary | ICD-10-CM | POA: Insufficient documentation

## 2016-07-16 DIAGNOSIS — Z87891 Personal history of nicotine dependence: Secondary | ICD-10-CM | POA: Diagnosis not present

## 2016-07-16 DIAGNOSIS — M1990 Unspecified osteoarthritis, unspecified site: Secondary | ICD-10-CM | POA: Diagnosis not present

## 2016-07-16 DIAGNOSIS — Z95828 Presence of other vascular implants and grafts: Secondary | ICD-10-CM

## 2016-07-16 DIAGNOSIS — C50912 Malignant neoplasm of unspecified site of left female breast: Secondary | ICD-10-CM | POA: Diagnosis not present

## 2016-07-16 DIAGNOSIS — Z419 Encounter for procedure for purposes other than remedying health state, unspecified: Secondary | ICD-10-CM

## 2016-07-16 DIAGNOSIS — F419 Anxiety disorder, unspecified: Secondary | ICD-10-CM | POA: Diagnosis not present

## 2016-07-16 DIAGNOSIS — Z170 Estrogen receptor positive status [ER+]: Secondary | ICD-10-CM | POA: Insufficient documentation

## 2016-07-16 DIAGNOSIS — Z452 Encounter for adjustment and management of vascular access device: Secondary | ICD-10-CM | POA: Diagnosis not present

## 2016-07-16 HISTORY — PX: PORTACATH PLACEMENT: SHX2246

## 2016-07-16 LAB — BASIC METABOLIC PANEL
ANION GAP: 9 (ref 5–15)
BUN: 13 mg/dL (ref 6–20)
CHLORIDE: 98 mmol/L — AB (ref 101–111)
CO2: 29 mmol/L (ref 22–32)
Calcium: 9.3 mg/dL (ref 8.9–10.3)
Creatinine, Ser: 0.81 mg/dL (ref 0.44–1.00)
GFR calc non Af Amer: >60 mL/min (ref >60)
Glucose, Bld: 101 mg/dL — ABNORMAL HIGH (ref 65–99)
POTASSIUM: 3.6 mmol/L (ref 3.5–5.1)
Sodium: 136 mmol/L (ref 135–145)

## 2016-07-16 SURGERY — INSERTION, TUNNELED CENTRAL VENOUS DEVICE, WITH PORT
Anesthesia: General | Laterality: Right

## 2016-07-16 MED ORDER — BUPIVACAINE HCL (PF) 0.25 % IJ SOLN
INTRAMUSCULAR | Status: AC
  Filled 2016-07-16: qty 30

## 2016-07-16 MED ORDER — PROPOFOL 10 MG/ML IV BOLUS
INTRAVENOUS | Status: DC | PRN
  Administered 2016-07-16: 160 mg via INTRAVENOUS

## 2016-07-16 MED ORDER — 0.9 % SODIUM CHLORIDE (POUR BTL) OPTIME
TOPICAL | Status: DC | PRN
  Administered 2016-07-16: 1000 mL

## 2016-07-16 MED ORDER — LIDOCAINE 2% (20 MG/ML) 5 ML SYRINGE
INTRAMUSCULAR | Status: AC
  Filled 2016-07-16: qty 5

## 2016-07-16 MED ORDER — HYDROCODONE-ACETAMINOPHEN 5-325 MG PO TABS: 1.0000 | ORAL_TABLET | ORAL | 0 refills | Status: DC | PRN

## 2016-07-16 MED ORDER — CEFAZOLIN SODIUM-DEXTROSE 2-4 GM/100ML-% IV SOLN
INTRAVENOUS | Status: AC
  Filled 2016-07-16: qty 100

## 2016-07-16 MED ORDER — HEPARIN SOD (PORK) LOCK FLUSH 100 UNIT/ML IV SOLN
INTRAVENOUS | Status: DC | PRN
  Administered 2016-07-16: 500 [IU] via INTRAVENOUS

## 2016-07-16 MED ORDER — FENTANYL CITRATE (PF) 100 MCG/2ML IJ SOLN
INTRAMUSCULAR | Status: DC | PRN
  Administered 2016-07-16 (×2): 25 ug via INTRAVENOUS

## 2016-07-16 MED ORDER — HEPARIN SOD (PORK) LOCK FLUSH 100 UNIT/ML IV SOLN
INTRAVENOUS | Status: AC
  Filled 2016-07-16: qty 5

## 2016-07-16 MED ORDER — SODIUM CHLORIDE 0.9 % IV SOLN
INTRAVENOUS | Status: DC | PRN
  Administered 2016-07-16: 08:00:00

## 2016-07-16 MED ORDER — EPHEDRINE 5 MG/ML INJ
INTRAVENOUS | Status: AC
  Filled 2016-07-16: qty 10

## 2016-07-16 MED ORDER — CEFAZOLIN SODIUM-DEXTROSE 2-4 GM/100ML-% IV SOLN
2.0000 g | INTRAVENOUS | Status: AC
  Administered 2016-07-16: 2 g via INTRAVENOUS

## 2016-07-16 MED ORDER — CHLORHEXIDINE GLUCONATE CLOTH 2 % EX PADS: 6.0000 | MEDICATED_PAD | Freq: Once | CUTANEOUS | Status: DC

## 2016-07-16 MED ORDER — BUPIVACAINE HCL (PF) 0.25 % IJ SOLN
INTRAMUSCULAR | Status: DC | PRN
  Administered 2016-07-16: 10 mL

## 2016-07-16 MED ORDER — ONDANSETRON HCL 4 MG/2ML IJ SOLN
INTRAMUSCULAR | Status: DC | PRN
  Administered 2016-07-16: 4 mg via INTRAVENOUS

## 2016-07-16 MED ORDER — LACTATED RINGERS IV SOLN
INTRAVENOUS | Status: DC | PRN
  Administered 2016-07-16 (×2): via INTRAVENOUS

## 2016-07-16 MED ORDER — GLYCOPYRROLATE 0.2 MG/ML IJ SOLN
INTRAMUSCULAR | Status: DC | PRN
  Administered 2016-07-16: .4 mg via INTRAVENOUS

## 2016-07-16 MED ORDER — ONDANSETRON HCL 4 MG/2ML IJ SOLN
INTRAMUSCULAR | Status: AC
  Filled 2016-07-16: qty 2

## 2016-07-16 MED ORDER — PHENYLEPHRINE 40 MCG/ML (10ML) SYRINGE FOR IV PUSH (FOR BLOOD PRESSURE SUPPORT)
PREFILLED_SYRINGE | INTRAVENOUS | Status: AC
  Filled 2016-07-16: qty 10

## 2016-07-16 MED ORDER — PROPOFOL 10 MG/ML IV BOLUS
INTRAVENOUS | Status: AC
  Filled 2016-07-16: qty 20

## 2016-07-16 MED ORDER — PHENYLEPHRINE HCL 10 MG/ML IJ SOLN
INTRAMUSCULAR | Status: DC | PRN
  Administered 2016-07-16 (×2): 80 ug via INTRAVENOUS

## 2016-07-16 MED ORDER — EPHEDRINE SULFATE 50 MG/ML IJ SOLN
INTRAMUSCULAR | Status: DC | PRN
  Administered 2016-07-16 (×2): 10 mg via INTRAVENOUS
  Administered 2016-07-16: 15 mg via INTRAVENOUS

## 2016-07-16 MED ORDER — FENTANYL CITRATE (PF) 250 MCG/5ML IJ SOLN
INTRAMUSCULAR | Status: AC
  Filled 2016-07-16: qty 5

## 2016-07-16 MED ORDER — LIDOCAINE 2% (20 MG/ML) 5 ML SYRINGE
INTRAMUSCULAR | Status: DC | PRN
  Administered 2016-07-16: 60 mg via INTRAVENOUS

## 2016-07-16 SURGICAL SUPPLY — 50 items
BAG DECANTER FOR FLEXI CONT (MISCELLANEOUS) ×3 IMPLANT
BLADE SURG 15 STRL LF DISP TIS (BLADE) ×1 IMPLANT
BLADE SURG 15 STRL SS (BLADE) ×2
CHLORAPREP W/TINT 10.5 ML (MISCELLANEOUS) ×3 IMPLANT
COVER SURGICAL LIGHT HANDLE (MISCELLANEOUS) ×3 IMPLANT
COVER TRANSDUCER ULTRASND GEL (DRAPE) IMPLANT
CRADLE DONUT ADULT HEAD (MISCELLANEOUS) ×3 IMPLANT
DRAPE C-ARM 42X72 X-RAY (DRAPES) ×3 IMPLANT
DRAPE CHEST BREAST 15X10 FENES (DRAPES) ×3 IMPLANT
DRAPE UTILITY XL STRL (DRAPES) ×3 IMPLANT
ELECT CAUTERY BLADE 6.4 (BLADE) ×3 IMPLANT
ELECT REM PT RETURN 9FT ADLT (ELECTROSURGICAL) ×3
ELECTRODE REM PT RTRN 9FT ADLT (ELECTROSURGICAL) ×1 IMPLANT
GAUZE SPONGE 4X4 16PLY XRAY LF (GAUZE/BANDAGES/DRESSINGS) ×3 IMPLANT
GEL ULTRASOUND 20GR AQUASONIC (MISCELLANEOUS) IMPLANT
GLOVE BIO SURGEON STRL SZ7.5 (GLOVE) IMPLANT
GLOVE BIOGEL PI IND STRL 6.5 (GLOVE) ×1 IMPLANT
GLOVE BIOGEL PI IND STRL 7.0 (GLOVE) ×1 IMPLANT
GLOVE BIOGEL PI INDICATOR 6.5 (GLOVE) ×2
GLOVE BIOGEL PI INDICATOR 7.0 (GLOVE) ×2
GLOVE SURG SS PI 6.5 STRL IVOR (GLOVE) ×3 IMPLANT
GLOVE SURG SS PI 7.5 STRL IVOR (GLOVE) ×3 IMPLANT
GOWN STRL REUS W/ TWL LRG LVL3 (GOWN DISPOSABLE) ×2 IMPLANT
GOWN STRL REUS W/TWL LRG LVL3 (GOWN DISPOSABLE) ×4
INTRODUCER COOK 11FR (CATHETERS) IMPLANT
KIT BASIN OR (CUSTOM PROCEDURE TRAY) ×3 IMPLANT
KIT PORT POWER 8FR ISP CVUE (Catheter) ×3 IMPLANT
KIT ROOM TURNOVER OR (KITS) ×3 IMPLANT
LIQUID BAND (GAUZE/BANDAGES/DRESSINGS) ×3 IMPLANT
NEEDLE 22X1 1/2 (OR ONLY) (NEEDLE) IMPLANT
NEEDLE HYPO 25GX1X1/2 BEV (NEEDLE) ×3 IMPLANT
NS IRRIG 1000ML POUR BTL (IV SOLUTION) ×3 IMPLANT
PACK SURGICAL SETUP 50X90 (CUSTOM PROCEDURE TRAY) ×3 IMPLANT
PAD ARMBOARD 7.5X6 YLW CONV (MISCELLANEOUS) ×3 IMPLANT
PENCIL BUTTON HOLSTER BLD 10FT (ELECTRODE) ×3 IMPLANT
SET INTRODUCER 12FR PACEMAKER (SHEATH) IMPLANT
SET SHEATH INTRODUCER 10FR (MISCELLANEOUS) IMPLANT
SHEATH COOK PEEL AWAY SET 9F (SHEATH) IMPLANT
SUT MNCRL AB 4-0 PS2 18 (SUTURE) ×3 IMPLANT
SUT PROLENE 2 0 SH 30 (SUTURE) ×6 IMPLANT
SUT SILK 2 0 (SUTURE)
SUT SILK 2-0 18XBRD TIE 12 (SUTURE) IMPLANT
SUT VIC AB 3-0 SH 27 (SUTURE) ×2
SUT VIC AB 3-0 SH 27XBRD (SUTURE) ×1 IMPLANT
SYR 20ML ECCENTRIC (SYRINGE) ×6 IMPLANT
SYR 5ML LUER SLIP (SYRINGE) ×3 IMPLANT
SYR CONTROL 10ML LL (SYRINGE) ×3 IMPLANT
SYRINGE 10CC LL (SYRINGE) IMPLANT
TOWEL OR 17X24 6PK STRL BLUE (TOWEL DISPOSABLE) ×3 IMPLANT
TOWEL OR 17X26 10 PK STRL BLUE (TOWEL DISPOSABLE) ×3 IMPLANT

## 2016-07-16 NOTE — Anesthesia Postprocedure Evaluation (Signed)
Anesthesia Post Note  Patient: Charlotte [name]  Procedure(s) Performed: Procedure(s) (LRB): INSERTION PORT-A-CATH RIGHT SUBLCLAVIAN (Right)  Patient location during evaluation: PACU Anesthesia Type: General Level of consciousness: awake and alert Pain management: pain level controlled Vital Signs Assessment: post-procedure vital signs reviewed and stable Respiratory status: spontaneous breathing, nonlabored ventilation, respiratory function stable and patient connected to nasal cannula oxygen Cardiovascular status: blood pressure returned to baseline and stable Postop Assessment: no signs of nausea or vomiting Anesthetic complications: no    Last Vitals:  Vitals:   07/16/16 0847 07/16/16 0942  BP:  (!) 155/73  Resp:  16  Temp: 36.3 C     Last Pain: There were no vitals filed for this visit.               [name]

## 2016-07-16 NOTE — Interval H&P Note (Signed)
History and Physical Interval Note:  07/16/2016 7:19 AM  Charlotte [name]  has presented today for surgery, with the diagnosis of LEFT BREAST CANCER  The various methods of treatment have been discussed with the patient and family. After consideration of risks, benefits and other options for treatment, the patient has consented to  Procedure(s): INSERTION PORT-A-CATH WITH [place] (N/A) as a surgical intervention .  The patient's history has been reviewed, patient examined, no change in status, stable for surgery.  I have reviewed the patient's chart and labs.  Questions were answered to the patient's satisfaction.     TOTH III,Cipriano Millikan S

## 2016-07-16 NOTE — Anesthesia Procedure Notes (Signed)
Procedure Name: LMA Insertion Date/Time: 07/16/2016 7:46 AM Performed by: Salli Quarry Angella Montas Pre-anesthesia Checklist: Patient identified, Emergency Drugs available, Suction available and Patient being monitored Patient Re-evaluated:Patient Re-evaluated prior to inductionOxygen Delivery Method: Circle System Utilized Preoxygenation: Pre-oxygenation with 100% oxygen Intubation Type: IV induction LMA: LMA inserted LMA Size: 4.0 Number of attempts: 1 Airway Equipment and Method: Bite block Placement Confirmation: positive ETCO2 Tube secured with: Tape Dental Injury: Teeth and Oropharynx as per pre-operative assessment

## 2016-07-16 NOTE — Transfer of Care (Signed)
Immediate Anesthesia Transfer of Care Note  Patient: Charlotte [name]  Procedure(s) Performed: Procedure(s): INSERTION PORT-A-CATH RIGHT SUBLCLAVIAN (Right)  Patient Location: PACU  Anesthesia Type:General  Level of Consciousness: awake, alert , oriented and patient cooperative  Airway & Oxygen Therapy: Patient Spontanous Breathing and Patient connected to nasal cannula oxygen  Post-op Assessment: Report given to RN and Post -op Vital signs reviewed and stable  Post vital signs: Reviewed and stable  Last Vitals:  Vitals:   07/16/16 0633  BP: (!) 167/64  Resp: 16  Temp: 36.8 C    Last Pain: There were no vitals filed for this visit.    Patients Stated Pain Goal: 3 ([id number])  Complications: No apparent anesthesia complications

## 2016-07-16 NOTE — H&P (Signed)
Charlotte [name]  Location: [facility] Patient #: [id number] DOB: Apr 21, 1928 Divorced / Language: English / Race: Black or African American Female   History of Present Illness  The patient is a 82 year old female who presents for a follow-up for Breast cancer. The patient is an 82 year old black female who is about 3 weeks status post left breast lumpectomy and sentinel node mapping for a T2 N0 left breast cancer. She was ER positive and PR negative and HER-2 positive with a Ki-67 of 15%. She tolerated the surgery well. She does have a lot of anxiety. Because she is HER-2 positive she will receive Herceptin and will need a Port-A-Cath.   Other Problems  Anxiety Disorder Back Pain Breast Cancer High blood pressure Thyroid Disease  Past Surgical History  Breast Mass; Local Excision Right. Cataract Surgery Bilateral. Colon Polyp Removal - Colonoscopy Foot Surgery Bilateral. Tonsillectomy  Diagnostic Studies History  Mammogram within last year  Allergies Latex Exam Gloves *MEDICAL DEVICES AND SUPPLIES* Ibuprofen *ANALGESICS - ANTI-INFLAMMATORY* Valdecoxib *ANALGESICS - ANTI-INFLAMMATORY*  Medication History Tylenol (500MG Capsule, Oral) Active. Aspirin (81MG Tablet, Oral) Active. Calcium Gluconate (600MG Tablet, Oral) Active. Debrox (6.5% Solution, Otic) Active. Centrum Adults (Oral) Active. Restasis (0.05% Emulsion, Ophthalmic) Active. Tylenol PM Extra Strength (500-25MG Tablet, Oral) Active. Plendil (5MG Tablet ER 24HR, Oral) Active. Fish Oil (1200MG Capsule DR, Oral) Active. Flonase (50MCG/DOSE Inhaler, Nasal) Active. HydrALAZINE HCl (50MG Tablet, Oral) Active. Hydrocodone-Acetaminophen (5-325MG Tablet, Oral) Active. Lidocaine (4% Cream, External) Active. LORazepam (1MG Tablet, Oral three times daily) Active. Losartan Potassium (100MG Tablet, Oral) Active. Melatonin (5MG Tablet, Oral) Active. Salonpas (External)  Active. Tretinoin (0.025% Cream, External) Active. Medications Reconciled  Social History Alcohol use Remotely quit alcohol use. Caffeine use Coffee. No drug use Tobacco use Former smoker.  Family History Heart Disease Mother. Heart disease in female family member before age 28 Hypertension Daughter, Mother, Sister.  Pregnancy / Birth History  Age at menarche 52 years. Age of menopause 25-50 Gravida 3 Maternal age 52-30 Para 3    Review of Systems General Present- Fatigue and Weight Loss. Not Present- Appetite Loss, Chills, Fever, Night Sweats and Weight Gain. Skin Not Present- Change in Wart/Mole, Dryness, Hives, Jaundice, New Lesions, Non-Healing Wounds, Rash and Ulcer. HEENT Present- Wears glasses/contact lenses. Not Present- Earache, Hearing Loss, Hoarseness, Nose Bleed, Oral Ulcers, Ringing in the Ears, Seasonal Allergies, Sinus Pain, Sore Throat, Visual Disturbances and Yellow Eyes. Respiratory Not Present- Bloody sputum, Chronic Cough, Difficulty Breathing, Snoring and Wheezing. Breast Present- Breast Mass. Not Present- Breast Pain, Nipple Discharge and Skin Changes. Cardiovascular Present- Shortness of Breath. Not Present- Chest Pain, Difficulty Breathing Lying Down, Leg Cramps, Palpitations, Rapid Heart Rate and Swelling of Extremities. Gastrointestinal Present- Constipation. Not Present- Abdominal Pain, Bloating, Bloody Stool, Change in Bowel Habits, Chronic diarrhea, Difficulty Swallowing, Excessive gas, Gets full quickly at meals, Hemorrhoids, Indigestion, Nausea, Rectal Pain and Vomiting. Female Genitourinary Present- Nocturia. Not Present- Frequency, Painful Urination, Pelvic Pain and Urgency. Musculoskeletal Present- Back Pain. Not Present- Joint Pain, Joint Stiffness, Muscle Pain, Muscle Weakness and Swelling of Extremities. Neurological Present- Weakness. Not Present- Decreased Memory, Fainting, Headaches, Numbness, Seizures, Tingling, Tremor and  Trouble walking. Psychiatric Present- Anxiety and Change in Sleep Pattern. Not Present- Bipolar, Depression, Fearful and Frequent crying. Endocrine Not Present- Cold Intolerance, Excessive Hunger, Hair Changes, Heat Intolerance, Hot flashes and New Diabetes. Hematology Not Present- Blood Thinners, Easy Bruising, Excessive bleeding, Gland problems, HIV and Persistent Infections.  Vitals  Weight: 121 lb Height: 63in Body Surface  Area: 1.56 m Body Mass Index: 21.43 kg/m  Temp.: 98.92F(Temporal)  Pulse: 80 (Regular)  BP: 130/60 (Sitting, Left Arm, Standard)       Physical Exam  General Mental Status-Alert. General Appearance-Consistent with stated age. Hydration-Well hydrated. Voice-Normal.  Head and Neck Head-normocephalic, atraumatic with no lesions or palpable masses. Trachea-midline. Thyroid Gland Characteristics - normal size and consistency.  Eye Eyeball - Bilateral-Extraocular movements intact. Sclera/Conjunctiva - Bilateral-No scleral icterus.  Chest and Lung Exam Chest and lung exam reveals -quiet, even and easy respiratory effort with no use of accessory muscles and on auscultation, normal breath sounds, no adventitious sounds and normal vocal resonance. Inspection Chest Wall - Normal. Back - normal.  Breast Note: The left breast and axillary incisions are healing nicely with no sign of infection or seroma.   Cardiovascular Cardiovascular examination reveals -normal heart sounds, regular rate and rhythm with no murmurs and normal pedal pulses bilaterally.  Abdomen Inspection Inspection of the abdomen reveals - No Hernias. Skin - Scar - no surgical scars. Palpation/Percussion Palpation and Percussion of the abdomen reveal - Soft, Non Tender, No Rebound tenderness, No Rigidity (guarding) and No hepatosplenomegaly. Auscultation Auscultation of the abdomen reveals - Bowel sounds normal.  Neurologic Neurologic evaluation  reveals -alert and oriented x 3 with no impairment of recent or remote memory. Mental Status-Normal.  Musculoskeletal Normal Exam - Left-Upper Extremity Strength Normal and Lower Extremity Strength Normal. Normal Exam - Right-Upper Extremity Strength Normal and Lower Extremity Strength Normal.  Lymphatic Head & Neck  General Head & Neck Lymphatics: Bilateral - Description - Normal. Axillary  General Axillary Region: Bilateral - Description - Normal. Tenderness - Non Tender. Femoral & Inguinal  Generalized Femoral & Inguinal Lymphatics: Bilateral - Description - Normal. Tenderness - Non Tender.    Assessment & Plan  BREAST CANCER OF UPPER-INNER QUADRANT OF LEFT FEMALE BREAST (C50.212) Impression: The patient is about 3 weeks status post left breast lumpectomy for breast cancer. She tolerated the surgery well. She will need a Port-A-Cath to receive Herceptin. I have discussed with her in detail the risks and benefits of the operation to place the Port-A-Cath as well as some of the technical aspects and she understands and wishes to proceed. Current Plans Follow up with [place] in the office in 3 MONTHS.  Call us sooner as needed.

## 2016-07-16 NOTE — Op Note (Signed)
07/16/2016  8:36 AM  PATIENT:  Charlotte [name]  82 y.o. female  PRE-OPERATIVE DIAGNOSIS:  LEFT BREAST CANCER  POST-OPERATIVE DIAGNOSIS:  LEFT BREAST CANCER  PROCEDURE:  Procedure(s): INSERTION PORT-A-CATH (N/A) right subclavian vein  SURGEON:  Surgeon(s) and Role:    * Jovita Kussmaul, MD - Primary  PHYSICIAN ASSISTANT:   ASSISTANTS: none   ANESTHESIA:   general  EBL:  Total I/O In: 1000 [I.V.:1000] Out: -   BLOOD ADMINISTERED:none  DRAINS: none   LOCAL MEDICATIONS USED:  MARCAINE     SPECIMEN:  No Specimen  DISPOSITION OF SPECIMEN:  N/A  COUNTS:  YES  TOURNIQUET:  * No tourniquets in log *  DICTATION: .Dragon Dictation   After informed consent was obtained the patient was brought to the operating room and placed in the supine position on the operating room table. After adequate induction of general anesthesia a roll was placed between the patient's shoulder blades to extend the shoulder slightly.  The right chest and neck area were then prepped with ChloraPrep, allowed to dry, and draped in usual sterile manner. An appropriate timeout was performed. The patient was placed in Trendelenburg position. Using a large bore needle from the Port-A-Cath kit I was able to slide beneath the bend of the clavicle on the right chest wall and heading towards the sternal notch I was able to access the right subclavian vein without difficulty. A wire was placed through the needle using the Seldinger technique without difficulty.  The wire was confirmed in the central venous system using real-time fluoroscopy. Next a small incision was made on the right chest wall at the wire entry site with a 15 blade knife. The incision was carried through the skin and subcutaneous tissues tissue sharply with electrocautery. A subcutaneous pocket was created inferior to the incision by blunt finger dissection. Next the tubing was placed on the reservoir. The reservoir was placed in the pocket and the length of  the tubing was estimated using real-time fluoroscopy. The tubing was cut to the appropriate length. Next a sheath and dilator were placed over the wire using the Seldinger technique without difficulty.  The dilator and wire were removed. The tubing was fed through the sheath as far as it would go and then held in place while the sheath was gently cracked and separated. Another real-time fluoroscopy image showed the tip of the catheter to be in the distal superior vena cava. The tubing was then permanently anchored to the reservoir. The reservoir was anchored in the pocket with 2 2-0 Prolene stitches. The port was then aspirated and it aspirated blood easily. The port was then flushed initially with a dilute heparin solution and then with a more concentrated heparin solution.  The subcutaneous tissue was closed over the port with interrupted 3-0 Vicryl stitches. The skin was then closed with a running 4-0 Monocryl subcuticular stitch. Dermabond dressings were applied. The patient tolerated the procedure well. At the end of the case all needle sponge counts were correct. The patient was then awakened and taken to recovery in stable condition.  PLAN OF CARE: Discharge to home after PACU  PATIENT DISPOSITION:  PACU - hemodynamically stable.   Delay start of Pharmacological VTE agent (>24hrs) due to surgical blood loss or risk of bleeding: not applicable

## 2016-07-17 ENCOUNTER — Ambulatory Visit: Payer: Medicare Other

## 2016-07-17 ENCOUNTER — Encounter (HOSPITAL_COMMUNITY): Payer: Self-pay | Admitting: General Surgery

## 2016-07-20 ENCOUNTER — Ambulatory Visit
Admission: RE | Admit: 2016-07-20 | Discharge: 2016-07-20 | Disposition: A | Discharge status: Home / Self Care | Payer: Medicare Other | Source: Ambulatory Visit | Attending: Radiation Oncology | Admitting: Radiation Oncology

## 2016-07-20 ENCOUNTER — Other Ambulatory Visit: Payer: Self-pay | Admitting: *Deleted

## 2016-07-20 ENCOUNTER — Encounter: Payer: Self-pay | Admitting: *Deleted

## 2016-07-20 DIAGNOSIS — Z510 Encounter for antineoplastic radiation therapy: Secondary | ICD-10-CM | POA: Diagnosis not present

## 2016-07-20 DIAGNOSIS — I10 Essential (primary) hypertension: Secondary | ICD-10-CM | POA: Diagnosis not present

## 2016-07-20 DIAGNOSIS — Z170 Estrogen receptor positive status [ER+]: Secondary | ICD-10-CM | POA: Diagnosis not present

## 2016-07-20 DIAGNOSIS — Z79899 Other long term (current) drug therapy: Secondary | ICD-10-CM | POA: Diagnosis not present

## 2016-07-20 DIAGNOSIS — C50212 Malignant neoplasm of upper-inner quadrant of left female breast: Secondary | ICD-10-CM | POA: Diagnosis not present

## 2016-07-20 DIAGNOSIS — M1990 Unspecified osteoarthritis, unspecified site: Secondary | ICD-10-CM | POA: Diagnosis not present

## 2016-07-20 NOTE — Progress Notes (Signed)
[place] Psychosocial Distress Screening Clinical Social Work  Clinical Social Work was referred by distress screening protocol.  The patient scored a 5 on the Psychosocial Distress Thermometer which indicates moderate distress. Clinical Social Worker contacted patient at home to assess for distress and other psychosocial needs.  CSW left patient a message offering support and information on the support team and support services at [facility].  CSW encouraged patient to call with any questions or concerns.     ONCBCN DISTRESS SCREENING 07/02/2016  Screening Type Initial Screening  Distress experienced in past week (1-10) 5  Emotional problem type Nervousness/Anxiety  Physical Problem type Sleep/insomnia;Constipation/diarrhea  Physician notified of physical symptoms Yes  Referral to clinical social work No;Yes    Johnnye Lana, MSW, LCSW, [facility] Clinical Social Worker [employer] [phone]

## 2016-07-21 ENCOUNTER — Ambulatory Visit
Admission: RE | Admit: 2016-07-21 | Discharge: 2016-07-21 | Disposition: A | Discharge status: Home / Self Care | Payer: Medicare Other | Source: Ambulatory Visit | Attending: Radiation Oncology | Admitting: Radiation Oncology

## 2016-07-21 ENCOUNTER — Ambulatory Visit (HOSPITAL_BASED_OUTPATIENT_CLINIC_OR_DEPARTMENT_OTHER): Payer: Medicare Other

## 2016-07-21 ENCOUNTER — Encounter: Payer: Self-pay | Admitting: Hematology and Oncology

## 2016-07-21 ENCOUNTER — Other Ambulatory Visit: Payer: Self-pay

## 2016-07-21 ENCOUNTER — Ambulatory Visit (HOSPITAL_BASED_OUTPATIENT_CLINIC_OR_DEPARTMENT_OTHER): Payer: Medicare Other | Admitting: Hematology and Oncology

## 2016-07-21 ENCOUNTER — Other Ambulatory Visit (HOSPITAL_BASED_OUTPATIENT_CLINIC_OR_DEPARTMENT_OTHER): Payer: Medicare Other

## 2016-07-21 DIAGNOSIS — C50212 Malignant neoplasm of upper-inner quadrant of left female breast: Secondary | ICD-10-CM

## 2016-07-21 DIAGNOSIS — C50412 Malignant neoplasm of upper-outer quadrant of left female breast: Secondary | ICD-10-CM

## 2016-07-21 DIAGNOSIS — I10 Essential (primary) hypertension: Secondary | ICD-10-CM | POA: Diagnosis not present

## 2016-07-21 DIAGNOSIS — Z170 Estrogen receptor positive status [ER+]: Secondary | ICD-10-CM

## 2016-07-21 DIAGNOSIS — Z510 Encounter for antineoplastic radiation therapy: Secondary | ICD-10-CM | POA: Diagnosis not present

## 2016-07-21 DIAGNOSIS — Z5112 Encounter for antineoplastic immunotherapy: Secondary | ICD-10-CM

## 2016-07-21 DIAGNOSIS — Z79899 Other long term (current) drug therapy: Secondary | ICD-10-CM | POA: Diagnosis not present

## 2016-07-21 DIAGNOSIS — M1990 Unspecified osteoarthritis, unspecified site: Secondary | ICD-10-CM | POA: Diagnosis not present

## 2016-07-21 LAB — CBC WITH DIFFERENTIAL/PLATELET
BASO%: 0.6 % (ref 0.0–2.0)
BASOS ABS: 0 10*3/uL (ref 0.0–0.1)
EOS ABS: 0 10*3/uL (ref 0.0–0.5)
EOS%: 0.4 % (ref 0.0–7.0)
HEMATOCRIT: 39.1 % (ref 34.8–46.6)
HGB: 12.9 g/dL (ref 11.6–15.9)
LYMPH%: 24.1 % (ref 14.0–49.7)
MCH: 29.2 pg (ref 25.1–34.0)
MCHC: 33 g/dL (ref 31.5–36.0)
MCV: 88.4 fL (ref 79.5–101.0)
MONO#: 0.5 10*3/uL (ref 0.1–0.9)
MONO%: 7.9 % (ref 0.0–14.0)
NEUT#: 3.8 10*3/uL (ref 1.5–6.5)
NEUT%: 67 % (ref 38.4–76.8)
PLATELETS: 347 10*3/uL (ref 145–400)
RBC: 4.42 10*6/uL (ref 3.70–5.45)
RDW: 15 % — ABNORMAL HIGH (ref 11.2–14.5)
WBC: 5.7 10*3/uL (ref 3.9–10.3)
lymph#: 1.4 10*3/uL (ref 0.9–3.3)

## 2016-07-21 LAB — COMPREHENSIVE METABOLIC PANEL
ALT: 22 U/L (ref 0–55)
ANION GAP: 9 meq/L (ref 3–11)
AST: 18 U/L (ref 5–34)
Albumin: 3.8 g/dL (ref 3.5–5.0)
Alkaline Phosphatase: 78 U/L (ref 40–150)
BILIRUBIN TOTAL: 0.38 mg/dL (ref 0.20–1.20)
BUN: 13.1 mg/dL (ref 7.0–26.0)
CALCIUM: 10.4 mg/dL (ref 8.4–10.4)
CHLORIDE: 96 meq/L — AB (ref 98–109)
CO2: 31 meq/L — AB (ref 22–29)
Creatinine: 1 mg/dL (ref 0.6–1.1)
EGFR: 63 mL/min/{1.73_m2} — AB (ref >90)
Glucose: 109 mg/dl (ref 70–140)
Potassium: 3.7 mEq/L (ref 3.5–5.1)
Sodium: 136 mEq/L (ref 136–145)
Total Protein: 7.9 g/dL (ref 6.4–8.3)

## 2016-07-21 MED ORDER — HEPARIN SOD (PORK) LOCK FLUSH 100 UNIT/ML IV SOLN
500.0000 [IU] | Freq: Once | INTRAVENOUS | Status: AC | PRN
  Administered 2016-07-21: 500 [IU]
  Filled 2016-07-21: qty 5

## 2016-07-21 MED ORDER — DIPHENHYDRAMINE HCL 25 MG PO CAPS
ORAL_CAPSULE | ORAL | Status: AC
  Filled 2016-07-21: qty 1

## 2016-07-21 MED ORDER — SODIUM CHLORIDE 0.9 % IV SOLN
Freq: Once | INTRAVENOUS | Status: AC
  Administered 2016-07-21: 13:00:00 via INTRAVENOUS

## 2016-07-21 MED ORDER — SODIUM CHLORIDE 0.9% FLUSH
10.0000 mL | INTRAVENOUS | Status: DC | PRN
  Administered 2016-07-21: 10 mL
  Filled 2016-07-21: qty 10

## 2016-07-21 MED ORDER — ALRA NON-METALLIC DEODORANT (RAD-ONC)
1.0000 "application " | Freq: Once | TOPICAL | Status: AC
  Administered 2016-07-21: 1 via TOPICAL

## 2016-07-21 MED ORDER — ACETAMINOPHEN 325 MG PO TABS
ORAL_TABLET | ORAL | Status: AC
  Filled 2016-07-21: qty 2

## 2016-07-21 MED ORDER — RADIAPLEXRX EX GEL
Freq: Once | CUTANEOUS | Status: AC
  Administered 2016-07-21: 10:00:00 via TOPICAL

## 2016-07-21 MED ORDER — ACETAMINOPHEN 325 MG PO TABS
650.0000 mg | ORAL_TABLET | Freq: Once | ORAL | Status: AC
  Administered 2016-07-21: 650 mg via ORAL

## 2016-07-21 MED ORDER — DIPHENHYDRAMINE HCL 25 MG PO CAPS
50.0000 mg | ORAL_CAPSULE | Freq: Once | ORAL | Status: AC
  Administered 2016-07-21: 50 mg via ORAL

## 2016-07-21 MED ORDER — TRASTUZUMAB CHEMO 150 MG IV SOLR
8.0000 mg/kg | Freq: Once | INTRAVENOUS | Status: AC
  Administered 2016-07-21: 441 mg via INTRAVENOUS
  Filled 2016-07-21: qty 21

## 2016-07-21 NOTE — Progress Notes (Signed)
Pt education done, radiaplex and alra ,my business card, radiation therapy and you book given, discussed ways to manage side effects, skin irritation,swelling/soreness breast, fatigue, pain, will increase protein in diet, stay hydrated, drink plenty water, use of electric razor for shaving axilla, increase protein in diet, teach back given 10:12 AM

## 2016-07-21 NOTE — Progress Notes (Signed)
Patient Care Team: Rosita Fire, MD as PCP - General (Internal Medicine)  SUMMARY OF ONCOLOGIC HISTORY:   Malignant neoplasm of upper-inner quadrant of left female breast ([place])   06/02/2016 Initial Diagnosis    Left breast biopsy 11:00 position: IDC with DCIS, ER 60%, PR 0%, Ki-67 15%, HER-2 negative ratio 1.4 to     06/17/2016 Surgery    Left lumpectomy: IDC grade 2, 2.4 cm, with intermediate grade DCIS, LVID present, margins negative, 0/4 lymph nodes positive, ER 60 pleasant, PR 0%, HER-2 negative ratio 1.42, Ki-67 15%, T2 N0 stage II a     07/17/2016 -  Radiation Therapy    Adjuvant radiation therapy     07/21/2016 -  Chemotherapy    Herceptin every 3 weeks 1 year along with anastrozole to start after radiation is complete      CHIEF COMPLIANT: Herceptin cycle 1 INTERVAL HISTORY: Charlotte [name] is a 82 year old with above-mentioned history of left breast cancer HER-2 positive disease who is here to start  cycle 1 of Herceptin area. Port has been placed and appears to be healed well. She denies any pain or discomfort. Denies nausea vomiting. Echocardiogram was normal.   REVIEW OF SYSTEMS:   Constitutional: Denies fevers, chills or abnormal weight loss Eyes: Denies blurriness of vision Ears, nose, mouth, throat, and face: Denies mucositis or sore throat Respiratory: Denies cough, dyspnea or wheezes Cardiovascular: Denies palpitation, chest discomfort Gastrointestinal:  Denies nausea, heartburn or change in bowel habits Skin: Denies abnormal skin rashes Lymphatics: Denies new lymphadenopathy or easy bruising Neurological:Denies numbness, tingling or new weaknesses Behavioral/Psych: Mood is stable, no new changes  Extremities: No lower extremity edema  All other systems were reviewed with the patient and are negative.  I have reviewed the past medical history, past surgical history, social history and family history with the patient and they are unchanged from previous  note.  ALLERGIES:  is allergic to latex; bextra [valdecoxib]; and motrin [ibuprofen].  MEDICATIONS:  Current Outpatient Prescriptions  Medication Sig Dispense Refill  . acetaminophen (TYLENOL) 500 MG tablet Take 1,000 mg by mouth daily as needed for mild pain. Reported on 01/27/2016    . aspirin EC 81 MG tablet Take 81 mg by mouth daily.    . Calcium Carbonate-Vitamin D (CALCIUM 600+D3 PO) Take 1 tablet by mouth 2 (two) times daily.    . carbamide peroxide (DEBROX) 6.5 % otic solution Place 1 drop into both ears daily as needed (ear wax buildup).    . cycloSPORINE (RESTASIS) 0.05 % ophthalmic emulsion Place 1 drop into both eyes 2 (two) times daily.    . diphenhydramine-acetaminophen (TYLENOL PM) 25-500 MG TABS tablet Take 1 tablet by mouth at bedtime as needed (sleep).    . felodipine (PLENDIL) 5 MG 24 hr tablet Take 5 mg by mouth daily.     . fluticasone (FLONASE) 50 MCG/ACT nasal spray Place 2 sprays into the nose daily as needed for allergies.    . hyaluronate sodium (RADIAPLEXRX) GEL Apply 1 application topically 2 (two) times daily.    . hydrALAZINE (APRESOLINE) 50 MG tablet Take 1 tablet (50 mg total) by mouth 2 (two) times daily. 180 tablet 3  . HYDROcodone-acetaminophen (NORCO) 5-325 MG tablet Take 1-2 tablets by mouth every 6 (six) hours as needed. 30 tablet 0  . HYDROcodone-acetaminophen (NORCO/VICODIN) 5-325 MG tablet Take 1-2 tablets by mouth every 4 (four) hours as needed for moderate pain or severe pain. 20 tablet 0  . lidocaine-prilocaine (EMLA) cream Apply  1 application topically as needed. 30 g 1  . Liniments (SALONPAS PAIN RELIEF PATCH) PADS Apply 1 each topically daily as needed (pain).    . LORazepam (ATIVAN) 1 MG tablet Take 0.5 mg by mouth every 8 (eight) hours as needed for anxiety or sleep.     Marland Kitchen losartan (COZAAR) 100 MG tablet Take 100 mg by mouth daily.    . Melatonin 5 MG TABS Take 5 mg by mouth at bedtime as needed (sleep).     . Multiple Vitamins-Minerals (CENTRUM  ADULTS PO) Take 1 tablet by mouth daily.    . non-metallic deodorant Jethro Poling) MISC Apply 1 application topically daily as needed.    . Omega-3 Fatty Acids (FISH OIL TRIPLE STRENGTH) 1400 MG CAPS Take 1 tablet by mouth daily.    Vladimir Faster Glycol-Propyl Glycol (SYSTANE) 0.4-0.3 % SOLN Apply 1 drop to eye 2 (two) times daily.    Marland Kitchen tretinoin (RETIN-A) 0.025 % cream Apply 1 application topically at bedtime.   3   No current facility-administered medications for this visit.     PHYSICAL EXAMINATION: ECOG PERFORMANCE STATUS: 1 - Symptomatic but completely ambulatory  Vitals:   07/21/16 1110 07/21/16 1136  BP: (!) 193/65 (!) 160/63  Pulse: 78   Resp: 18   Temp: 98.5 F (36.9 C)    Filed Weights   07/21/16 1110  Weight: 126 lb 3.2 oz (57.2 kg)    GENERAL:alert, no distress and comfortable SKIN: skin color, texture, turgor are normal, no rashes or significant lesions EYES: normal, Conjunctiva are pink and non-injected, sclera clear OROPHARYNX:no exudate, no erythema and lips, buccal mucosa, and tongue normal  NECK: supple, thyroid normal size, non-tender, without nodularity LYMPH:  no palpable lymphadenopathy in the cervical, axillary or inguinal LUNGS: clear to auscultation and percussion with normal breathing effort HEART: regular rate & rhythm and no murmurs and no lower extremity edema ABDOMEN:abdomen soft, non-tender and normal bowel sounds MUSCULOSKELETAL:no cyanosis of digits and no clubbing  NEURO: alert & oriented x 3 with fluent speech, no focal motor/sensory deficits EXTREMITIES: No lower extremity edema  LABORATORY DATA:  I have reviewed the data as listed   Chemistry      Component Value Date/Time   NA 136 07/21/2016 1003   K 3.7 07/21/2016 1003   CL 98 (L) 07/16/2016 0725   CO2 31 (H) 07/21/2016 1003   BUN 13.1 07/21/2016 1003   CREATININE 1.0 07/21/2016 1003      Component Value Date/Time   CALCIUM 10.4 07/21/2016 1003   ALKPHOS 78 07/21/2016 1003   AST 18  07/21/2016 1003   ALT 22 07/21/2016 1003   BILITOT 0.38 07/21/2016 1003       Lab Results  Component Value Date   WBC 5.7 07/21/2016   HGB 12.9 07/21/2016   HCT 39.1 07/21/2016   MCV 88.4 07/21/2016   PLT 347 07/21/2016   NEUTROABS 3.8 07/21/2016     ASSESSMENT & PLAN:  Malignant neoplasm of upper-inner quadrant of left female breast ([facility]) Left lumpectomy 06/17/2016: IDC grade 2, 2.4 cm, with intermediate grade DCIS, LVID present, margins negative, 0/4 lymph nodes positive, ER 60 pleasant, PR 0%, HER-2 negative ratio 1.42, Ki-67 15%, T2 N0 stage II a  Treatment plan: 1. Adjuvant Herceptin every 3 weeks started 07/21/2016 for 1 year with anastrozole  2. Adjuvant radiation started 07/17/2016 3. Followed by adjuvant antiestrogen therapy. Patient's cardiologist Dr. Carlyle Dolly to do her echocardiograms every 3 months for 1 year. -------------------------------------------------------------------------------------------------------------------------------- Current treatment: Herceptin every  3 weeks Monitoring closely for toxicities. Return to clinic every 6 weeks for follow-up and every 3 weeks with Herceptin. Patient will make an appointment to see radiation oncology for adjuvant radiation. Once the radiation is completed we will add antiestrogen therapy to her treatment regimen.   No orders of the defined types were placed in this encounter.  The patient has a good understanding of the overall plan. she agrees with it. she will call with any problems that may develop before the next visit here.   Rulon Eisenmenger, MD 07/21/16

## 2016-07-21 NOTE — Patient Instructions (Signed)
[facility] Discharge Instructions for Patients Receiving Chemotherapy  Today you received the following chemotherapy agents:  Herceptin  To help prevent nausea and vomiting after your treatment, we encourage you to take your nausea medication as prescribed.   If you develop nausea and vomiting that is not controlled by your nausea medication, call the clinic.   BELOW ARE SYMPTOMS THAT SHOULD BE REPORTED IMMEDIATELY:  *FEVER GREATER THAN 100.5 F  *CHILLS WITH OR WITHOUT FEVER  NAUSEA AND VOMITING THAT IS NOT CONTROLLED WITH YOUR NAUSEA MEDICATION  *UNUSUAL SHORTNESS OF BREATH  *UNUSUAL BRUISING OR BLEEDING  TENDERNESS IN MOUTH AND THROAT WITH OR WITHOUT PRESENCE OF ULCERS  *URINARY PROBLEMS  *BOWEL PROBLEMS  UNUSUAL RASH Items with * indicate a potential emergency and should be followed up as soon as possible.  Feel free to call the clinic you have any questions or concerns. The clinic phone number is [phone].  Please show the CHEMO ALERT CARD at check-in to the Emergency Department and triage nurse.   

## 2016-07-21 NOTE — Assessment & Plan Note (Signed)
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 Adjuvant Herceptin every 3 weeks for 1 year with anastrozole 2. Adjuvant radiation 3. Followed by adjuvant antiestrogen therapy. Patient's cardiologist Dr. Carlyle Dolly to do her echocardiograms every 3 months for 1 year. -------------------------------------------------------------------------------------------------------------------------------- Current treatment: Herceptin every 3 weeks Monitoring closely for toxicities. Return to clinic every 6 weeks for follow-up and every 3 weeks with Herceptin. Patient will make an appointment to see radiation oncology for adjuvant radiation. Once the radiation is completed we will add antiestrogen therapy to her treatment regimen.

## 2016-07-22 ENCOUNTER — Telehealth: Payer: Self-pay | Admitting: *Deleted

## 2016-07-22 ENCOUNTER — Ambulatory Visit
Admission: RE | Admit: 2016-07-22 | Discharge: 2016-07-22 | Disposition: A | Discharge status: Home / Self Care | Payer: Medicare Other | Source: Ambulatory Visit | Attending: Radiation Oncology | Admitting: Radiation Oncology

## 2016-07-22 DIAGNOSIS — Z510 Encounter for antineoplastic radiation therapy: Secondary | ICD-10-CM | POA: Diagnosis not present

## 2016-07-22 DIAGNOSIS — I10 Essential (primary) hypertension: Secondary | ICD-10-CM | POA: Diagnosis not present

## 2016-07-22 DIAGNOSIS — Z79899 Other long term (current) drug therapy: Secondary | ICD-10-CM | POA: Diagnosis not present

## 2016-07-22 DIAGNOSIS — M1990 Unspecified osteoarthritis, unspecified site: Secondary | ICD-10-CM | POA: Diagnosis not present

## 2016-07-22 DIAGNOSIS — C50212 Malignant neoplasm of upper-inner quadrant of left female breast: Secondary | ICD-10-CM | POA: Diagnosis not present

## 2016-07-22 DIAGNOSIS — Z170 Estrogen receptor positive status [ER+]: Secondary | ICD-10-CM | POA: Diagnosis not present

## 2016-07-22 NOTE — Telephone Encounter (Signed)
Spoke with pt and was informed that pt is doing fine.  Stated good appetite, drinking lots of fluids as tolerated, has to take stool softener to help with soft stools,  and bladder functions  fine.  Denied pain; just mild fatigue due to having to be up early for radiation treatments.  Pt understood to call office with any new problems.

## 2016-07-22 NOTE — Telephone Encounter (Signed)
Spoke to pt to assess needs after 1st herceptin. Relate doing well. Relate xrt going well even though she travels from [place] Denies needs or questions at this time. Encourage pt to call with concerns. Received verbal understanding.

## 2016-07-23 ENCOUNTER — Ambulatory Visit
Admission: RE | Admit: 2016-07-23 | Discharge: 2016-07-23 | Disposition: A | Discharge status: Home / Self Care | Payer: Medicare Other | Source: Ambulatory Visit | Attending: Radiation Oncology | Admitting: Radiation Oncology

## 2016-07-23 DIAGNOSIS — Z79899 Other long term (current) drug therapy: Secondary | ICD-10-CM | POA: Diagnosis not present

## 2016-07-23 DIAGNOSIS — M1990 Unspecified osteoarthritis, unspecified site: Secondary | ICD-10-CM | POA: Diagnosis not present

## 2016-07-23 DIAGNOSIS — Z170 Estrogen receptor positive status [ER+]: Secondary | ICD-10-CM | POA: Diagnosis not present

## 2016-07-23 DIAGNOSIS — Z510 Encounter for antineoplastic radiation therapy: Secondary | ICD-10-CM | POA: Diagnosis not present

## 2016-07-23 DIAGNOSIS — I10 Essential (primary) hypertension: Secondary | ICD-10-CM | POA: Diagnosis not present

## 2016-07-23 DIAGNOSIS — C50212 Malignant neoplasm of upper-inner quadrant of left female breast: Secondary | ICD-10-CM | POA: Diagnosis not present

## 2016-07-24 ENCOUNTER — Ambulatory Visit
Admission: RE | Admit: 2016-07-24 | Discharge: 2016-07-24 | Disposition: A | Discharge status: Home / Self Care | Payer: Medicare Other | Source: Ambulatory Visit | Attending: Radiation Oncology | Admitting: Radiation Oncology

## 2016-07-24 ENCOUNTER — Encounter: Payer: Self-pay | Admitting: Radiation Oncology

## 2016-07-24 VITALS — BP 183/62 | HR 70 | Temp 98.2°F | Resp 20 | Wt 128.5 lb

## 2016-07-24 DIAGNOSIS — C50212 Malignant neoplasm of upper-inner quadrant of left female breast: Secondary | ICD-10-CM

## 2016-07-24 DIAGNOSIS — Z79899 Other long term (current) drug therapy: Secondary | ICD-10-CM | POA: Diagnosis not present

## 2016-07-24 DIAGNOSIS — Z510 Encounter for antineoplastic radiation therapy: Secondary | ICD-10-CM | POA: Diagnosis not present

## 2016-07-24 DIAGNOSIS — M1990 Unspecified osteoarthritis, unspecified site: Secondary | ICD-10-CM | POA: Diagnosis not present

## 2016-07-24 DIAGNOSIS — I10 Essential (primary) hypertension: Secondary | ICD-10-CM | POA: Diagnosis not present

## 2016-07-24 DIAGNOSIS — Z170 Estrogen receptor positive status [ER+]: Secondary | ICD-10-CM | POA: Diagnosis not present

## 2016-07-24 NOTE — Progress Notes (Addendum)
Weekly rad txs left bresat 5/17 completed no skin changes  Using radiaplex twice daily, no c/o pain appetite good, mild fatigue 10:01 AM Wt Readings from Last 3 Encounters:  07/24/16 128 lb 8 oz (58.3 kg)  07/21/16 126 lb 3.2 oz (57.2 kg)  07/16/16 125 lb (56.7 kg)

## 2016-07-24 NOTE — Progress Notes (Signed)
Department of Radiation Oncology  Phone:  [phone] Fax:        [phone]  Weekly Treatment Note    Name: Charlotte [name] Date: 07/24/2016 MRN: [id number] DOB: 10-08-1928   Diagnosis:     ICD-9-CM ICD-10-CM   1. Malignant neoplasm of upper-inner quadrant of left female breast ([place]) 174.2 C50.212      Current dose: 12.5 Gy  Current fraction:5   MEDICATIONS: Current Outpatient Prescriptions  Medication Sig Dispense Refill  . acetaminophen (TYLENOL) 500 MG tablet Take 1,000 mg by mouth daily as needed for mild pain. Reported on 01/27/2016    . aspirin EC 81 MG tablet Take 81 mg by mouth daily.    . Calcium Carbonate-Vitamin D (CALCIUM 600+D3 PO) Take 1 tablet by mouth 2 (two) times daily.    . carbamide peroxide (DEBROX) 6.5 % otic solution Place 1 drop into both ears daily as needed (ear wax buildup).    . cycloSPORINE (RESTASIS) 0.05 % ophthalmic emulsion Place 1 drop into both eyes 2 (two) times daily.    . diphenhydramine-acetaminophen (TYLENOL PM) 25-500 MG TABS tablet Take 1 tablet by mouth at bedtime as needed (sleep).    . felodipine (PLENDIL) 5 MG 24 hr tablet Take 5 mg by mouth daily.     . fluticasone (FLONASE) 50 MCG/ACT nasal spray Place 2 sprays into the nose daily as needed for allergies.    . hyaluronate sodium (RADIAPLEXRX) GEL Apply 1 application topically 2 (two) times daily.    . hydrALAZINE (APRESOLINE) 50 MG tablet Take 1 tablet (50 mg total) by mouth 2 (two) times daily. 180 tablet 3  . lidocaine-prilocaine (EMLA) cream Apply 1 application topically as needed. 30 g 1  . Liniments (SALONPAS PAIN RELIEF PATCH) PADS Apply 1 each topically daily as needed (pain).    . LORazepam (ATIVAN) 1 MG tablet Take 0.5 mg by mouth every 8 (eight) hours as needed for anxiety or sleep.     Marland Kitchen losartan (COZAAR) 100 MG tablet Take 100 mg by mouth daily.    . Melatonin 5 MG TABS Take 5 mg by mouth at bedtime as needed (sleep).     . Multiple Vitamins-Minerals (CENTRUM  ADULTS PO) Take 1 tablet by mouth daily.    . non-metallic deodorant Jethro Poling) MISC Apply 1 application topically daily as needed.    . Omega-3 Fatty Acids (FISH OIL TRIPLE STRENGTH) 1400 MG CAPS Take 1 tablet by mouth daily.    Vladimir Faster Glycol-Propyl Glycol (SYSTANE) 0.4-0.3 % SOLN Apply 1 drop to eye 2 (two) times daily.    Marland Kitchen tretinoin (RETIN-A) 0.025 % cream Apply 1 application topically at bedtime.   3  . HYDROcodone-acetaminophen (NORCO) 5-325 MG tablet Take 1-2 tablets by mouth every 6 (six) hours as needed. (Patient not taking: Reported on 07/24/2016) 30 tablet 0  . HYDROcodone-acetaminophen (NORCO/VICODIN) 5-325 MG tablet Take 1-2 tablets by mouth every 4 (four) hours as needed for moderate pain or severe pain. (Patient not taking: Reported on 07/24/2016) 20 tablet 0   No current facility-administered medications for this encounter.      ALLERGIES: Latex; Bextra [valdecoxib]; and Motrin [ibuprofen]   LABORATORY DATA:  Lab Results  Component Value Date   WBC 5.7 07/21/2016   HGB 12.9 07/21/2016   HCT 39.1 07/21/2016   MCV 88.4 07/21/2016   PLT 347 07/21/2016   Lab Results  Component Value Date   NA 136 07/21/2016   K 3.7 07/21/2016   CL 98 (L) 07/16/2016  CO2 31 (H) 07/21/2016   Lab Results  Component Value Date   ALT 22 07/21/2016   AST 18 07/21/2016   ALKPHOS 78 07/21/2016   BILITOT 0.38 07/21/2016     NARRATIVE: Charlotte [name] was seen today for weekly treatment management. The chart was checked and the patient's films were reviewed.  Weekly rad txs left bresat 5/17 completed no skin changes  Using radiaplex twice daily, no c/o pain appetite good, mild fatigue 10:13 AM Wt Readings from Last 3 Encounters:  07/24/16 128 lb 8 oz (58.3 kg)  07/21/16 126 lb 3.2 oz (57.2 kg)  07/16/16 125 lb (56.7 kg)    PHYSICAL EXAMINATION: weight is 128 lb 8 oz (58.3 kg). Her oral temperature is 98.2 F (36.8 C). Her blood pressure is 183/62 (abnormal) and her pulse is 70. Her  respiration is 20.        ASSESSMENT: The patient is doing satisfactorily with treatment.  PLAN: We will continue with the patient's radiation treatment as planned.

## 2016-07-24 NOTE — Progress Notes (Signed)
  Radiation Oncology         [phone]) [phone] ________________________________  Name: Charlotte [name] MRN: [id number]  Date: 07/08/2016  DOB: 1928-10-21   DIAGNOSIS:     ICD-9-CM ICD-10-CM   1. Malignant neoplasm of upper-inner quadrant of left female breast ([place]) 174.2 C50.212     SIMULATION AND TREATMENT PLANNING NOTE  The patient presented for simulation prior to beginning her course of radiation treatment for her diagnosis of left-sided breast cancer. The patient was placed in a supine position on a breast board. A customized vac-lock bag was constructed and this complex treatment device will be used on a daily basis during her treatment. In this fashion, a CT scan was obtained through the chest area and an isocenter was placed near the chest wall within the breast.  The patient will be planned to receive a course of radiation initially to a dose of 42.5 Gy. This will consist of a whole breast radiotherapy technique. To accomplish this, 2 customized blocks have been designed which will correspond to medial and lateral whole breast tangent fields. This treatment will be accomplished at 2.5 Gy per fraction. A forward planning technique will also be evaluated to determine if this approach improves the plan. It is anticipated that the patient will then receive a 7.5 Gy boost to the seroma cavity which has been contoured. This will be accomplished at 2.5 Gy per fraction.   This initial treatment will consist of a 3-D conformal technique. The seroma has been contoured as the primary target structure. Additionally, dose volume histograms of both this target as well as the lungs and heart will also be evaluated. Such an approach is necessary to ensure that the target area is adequately covered while the nearby critical  normal structures are adequately spared.  Plan:  The final anticipated total dose therefore will correspond to 50 Gy.    _______________________________   Jodelle Gross, MD, PhD

## 2016-07-24 NOTE — Progress Notes (Signed)
  Radiation Oncology         [phone]) [phone] ________________________________  Name: Charlotte [name] MRN: [id number]  Date: 07/08/2016  DOB: 06/03/1928  Optical Surface Tracking Plan:  Since intensity modulated radiotherapy (IMRT) and 3D conformal radiation treatment methods are predicated on accurate and precise positioning for treatment, intrafraction motion monitoring is medically necessary to ensure accurate and safe treatment delivery.  The ability to quantify intrafraction motion without excessive ionizing radiation dose can only be performed with optical surface tracking. Accordingly, surface imaging offers the opportunity to obtain 3D measurements of patient position throughout IMRT and 3D treatments without excessive radiation exposure.  I am ordering optical surface tracking for this patient's upcoming course of radiotherapy. ________________________________  Kyung Rudd, MD 07/24/2016 10:15 AM    Reference:   Particia Jasper, et al. Surface imaging-based analysis of intrafraction motion for breast radiotherapy patients.Journal of [place], n. 6, nov. 2014. ISSN [id number].   Available at: <http://www.jacmp.org/index.php/jacmp/article/view/4957>.

## 2016-07-27 ENCOUNTER — Ambulatory Visit
Admission: RE | Admit: 2016-07-27 | Discharge: 2016-07-27 | Disposition: A | Discharge status: Home / Self Care | Payer: Medicare Other | Source: Ambulatory Visit | Attending: Radiation Oncology | Admitting: Radiation Oncology

## 2016-07-27 DIAGNOSIS — I10 Essential (primary) hypertension: Secondary | ICD-10-CM | POA: Diagnosis not present

## 2016-07-27 DIAGNOSIS — Z79899 Other long term (current) drug therapy: Secondary | ICD-10-CM | POA: Diagnosis not present

## 2016-07-27 DIAGNOSIS — C50212 Malignant neoplasm of upper-inner quadrant of left female breast: Secondary | ICD-10-CM | POA: Diagnosis not present

## 2016-07-27 DIAGNOSIS — Z510 Encounter for antineoplastic radiation therapy: Secondary | ICD-10-CM | POA: Diagnosis not present

## 2016-07-27 DIAGNOSIS — M1990 Unspecified osteoarthritis, unspecified site: Secondary | ICD-10-CM | POA: Diagnosis not present

## 2016-07-27 DIAGNOSIS — Z170 Estrogen receptor positive status [ER+]: Secondary | ICD-10-CM | POA: Diagnosis not present

## 2016-07-28 ENCOUNTER — Ambulatory Visit
Admission: RE | Admit: 2016-07-28 | Discharge: 2016-07-28 | Disposition: A | Discharge status: Home / Self Care | Payer: Medicare Other | Source: Ambulatory Visit | Attending: Radiation Oncology | Admitting: Radiation Oncology

## 2016-07-28 DIAGNOSIS — M1990 Unspecified osteoarthritis, unspecified site: Secondary | ICD-10-CM | POA: Diagnosis not present

## 2016-07-28 DIAGNOSIS — Z170 Estrogen receptor positive status [ER+]: Secondary | ICD-10-CM | POA: Diagnosis not present

## 2016-07-28 DIAGNOSIS — Z510 Encounter for antineoplastic radiation therapy: Secondary | ICD-10-CM | POA: Diagnosis not present

## 2016-07-28 DIAGNOSIS — Z79899 Other long term (current) drug therapy: Secondary | ICD-10-CM | POA: Diagnosis not present

## 2016-07-28 DIAGNOSIS — I10 Essential (primary) hypertension: Secondary | ICD-10-CM | POA: Diagnosis not present

## 2016-07-28 DIAGNOSIS — C50212 Malignant neoplasm of upper-inner quadrant of left female breast: Secondary | ICD-10-CM | POA: Diagnosis not present

## 2016-07-29 ENCOUNTER — Ambulatory Visit
Admission: RE | Admit: 2016-07-29 | Discharge: 2016-07-29 | Disposition: A | Discharge status: Home / Self Care | Payer: Medicare Other | Source: Ambulatory Visit | Attending: Radiation Oncology | Admitting: Radiation Oncology

## 2016-07-29 DIAGNOSIS — Z510 Encounter for antineoplastic radiation therapy: Secondary | ICD-10-CM | POA: Diagnosis not present

## 2016-07-29 DIAGNOSIS — M1990 Unspecified osteoarthritis, unspecified site: Secondary | ICD-10-CM | POA: Diagnosis not present

## 2016-07-29 DIAGNOSIS — I10 Essential (primary) hypertension: Secondary | ICD-10-CM | POA: Diagnosis not present

## 2016-07-29 DIAGNOSIS — Z170 Estrogen receptor positive status [ER+]: Secondary | ICD-10-CM | POA: Diagnosis not present

## 2016-07-29 DIAGNOSIS — Z79899 Other long term (current) drug therapy: Secondary | ICD-10-CM | POA: Diagnosis not present

## 2016-07-29 DIAGNOSIS — C50212 Malignant neoplasm of upper-inner quadrant of left female breast: Secondary | ICD-10-CM | POA: Diagnosis not present

## 2016-07-30 ENCOUNTER — Ambulatory Visit
Admission: RE | Admit: 2016-07-30 | Discharge: 2016-07-30 | Disposition: A | Discharge status: Home / Self Care | Payer: Medicare Other | Source: Ambulatory Visit | Attending: Radiation Oncology | Admitting: Radiation Oncology

## 2016-07-30 DIAGNOSIS — Z170 Estrogen receptor positive status [ER+]: Secondary | ICD-10-CM | POA: Diagnosis not present

## 2016-07-30 DIAGNOSIS — I10 Essential (primary) hypertension: Secondary | ICD-10-CM | POA: Diagnosis not present

## 2016-07-30 DIAGNOSIS — Z79899 Other long term (current) drug therapy: Secondary | ICD-10-CM | POA: Diagnosis not present

## 2016-07-30 DIAGNOSIS — C50212 Malignant neoplasm of upper-inner quadrant of left female breast: Secondary | ICD-10-CM | POA: Diagnosis not present

## 2016-07-30 DIAGNOSIS — Z510 Encounter for antineoplastic radiation therapy: Secondary | ICD-10-CM | POA: Diagnosis not present

## 2016-07-30 DIAGNOSIS — M1990 Unspecified osteoarthritis, unspecified site: Secondary | ICD-10-CM | POA: Diagnosis not present

## 2016-07-31 ENCOUNTER — Ambulatory Visit
Admission: RE | Admit: 2016-07-31 | Discharge: 2016-07-31 | Disposition: A | Discharge status: Home / Self Care | Payer: Medicare Other | Source: Ambulatory Visit | Attending: Radiation Oncology | Admitting: Radiation Oncology

## 2016-07-31 ENCOUNTER — Encounter: Payer: Self-pay | Admitting: Radiation Oncology

## 2016-07-31 VITALS — BP 175/63 | HR 75 | Temp 98.0°F | Wt 129.6 lb

## 2016-07-31 DIAGNOSIS — I10 Essential (primary) hypertension: Secondary | ICD-10-CM | POA: Diagnosis not present

## 2016-07-31 DIAGNOSIS — Z510 Encounter for antineoplastic radiation therapy: Secondary | ICD-10-CM | POA: Diagnosis not present

## 2016-07-31 DIAGNOSIS — Z79899 Other long term (current) drug therapy: Secondary | ICD-10-CM | POA: Diagnosis not present

## 2016-07-31 DIAGNOSIS — C50212 Malignant neoplasm of upper-inner quadrant of left female breast: Secondary | ICD-10-CM | POA: Diagnosis not present

## 2016-07-31 DIAGNOSIS — Z170 Estrogen receptor positive status [ER+]: Secondary | ICD-10-CM | POA: Diagnosis not present

## 2016-07-31 DIAGNOSIS — M1990 Unspecified osteoarthritis, unspecified site: Secondary | ICD-10-CM | POA: Diagnosis not present

## 2016-07-31 NOTE — Progress Notes (Signed)
Department of Radiation Oncology  Phone:  [phone] Fax:        [phone]  Weekly Treatment Note    Name: Charlotte [name] Date: 08/02/2016 MRN: [id number] DOB: [birth year]   Diagnosis:     ICD-9-CM ICD-10-CM   1. Malignant neoplasm of upper-inner quadrant of left female breast ([place]) 174.2 C50.212      Current dose: 25 Gy  Current fraction: 10   MEDICATIONS: Current Outpatient Prescriptions  Medication Sig Dispense Refill  . acetaminophen (TYLENOL) 500 MG tablet Take 1,000 mg by mouth daily as needed for mild pain. Reported on 01/27/2016    . aspirin EC 81 MG tablet Take 81 mg by mouth daily.    . Calcium Carbonate-Vitamin D (CALCIUM 600+D3 PO) Take 1 tablet by mouth 2 (two) times daily.    . carbamide peroxide (DEBROX) 6.5 % otic solution Place 1 drop into both ears daily as needed (ear wax buildup).    . cycloSPORINE (RESTASIS) 0.05 % ophthalmic emulsion Place 1 drop into both eyes 2 (two) times daily.    . diphenhydramine-acetaminophen (TYLENOL PM) 25-500 MG TABS tablet Take 1 tablet by mouth at bedtime as needed (sleep).    . felodipine (PLENDIL) 5 MG 24 hr tablet Take 5 mg by mouth daily.     . fluticasone (FLONASE) 50 MCG/ACT nasal spray Place 2 sprays into the nose daily as needed for allergies.    . hyaluronate sodium (RADIAPLEXRX) GEL Apply 1 application topically 2 (two) times daily.    . hydrALAZINE (APRESOLINE) 50 MG tablet Take 1 tablet (50 mg total) by mouth 2 (two) times daily. 180 tablet 3  . lidocaine-prilocaine (EMLA) cream Apply 1 application topically as needed. 30 g 1  . Liniments (SALONPAS PAIN RELIEF PATCH) PADS Apply 1 each topically daily as needed (pain).    . LORazepam (ATIVAN) 1 MG tablet Take 0.5 mg by mouth every 8 (eight) hours as needed for anxiety or sleep.     Marland Kitchen losartan (COZAAR) 100 MG tablet Take 100 mg by mouth daily.    . Melatonin 5 MG TABS Take 5 mg by mouth at bedtime as needed (sleep).     . Multiple Vitamins-Minerals (CENTRUM  ADULTS PO) Take 1 tablet by mouth daily.    . non-metallic deodorant Jethro Poling) MISC Apply 1 application topically daily as needed.    . Omega-3 Fatty Acids (FISH OIL TRIPLE STRENGTH) 1400 MG CAPS Take 1 tablet by mouth daily.    Vladimir Faster Glycol-Propyl Glycol (SYSTANE) 0.4-0.3 % SOLN Apply 1 drop to eye 2 (two) times daily.    Marland Kitchen tretinoin (RETIN-A) 0.025 % cream Apply 1 application topically at bedtime.   3   No current facility-administered medications for this encounter.      ALLERGIES: Latex; Bextra [valdecoxib]; and Motrin [ibuprofen]   LABORATORY DATA:  Lab Results  Component Value Date   WBC 5.7 07/21/2016   HGB 12.9 07/21/2016   HCT 39.1 07/21/2016   MCV 88.4 07/21/2016   PLT 347 07/21/2016   Lab Results  Component Value Date   NA 136 07/21/2016   K 3.7 07/21/2016   CL 98 (L) 07/16/2016   CO2 31 (H) 07/21/2016   Lab Results  Component Value Date   ALT 22 07/21/2016   AST 18 07/21/2016   ALKPHOS 78 07/21/2016   BILITOT 0.38 07/21/2016     NARRATIVE: Charlotte [name] was seen today for weekly treatment management. The chart was checked and the patient's films were reviewed.  [name] returns for weekly radiation treatment to the left breast. States skin is slightly pink on breat and under axilla. Using Radiaplex bid. Pain is well managed. No other complaints at this time.  9:42 AM Wt Readings from Last 3 Encounters:  07/31/16 129 lb 9.6 oz (58.8 kg)  07/24/16 128 lb 8 oz (58.3 kg)  07/21/16 126 lb 3.2 oz (57.2 kg)    PHYSICAL EXAMINATION: weight is 129 lb 9.6 oz (58.8 kg). Her oral temperature is 98 F (36.7 C). Her blood pressure is 175/63 (abnormal) and her pulse is 75.      Mild erythema in the treatment area.  ASSESSMENT: The patient is doing satisfactorily with treatment.  PLAN: We will continue with the patient's radiation treatment as planned.     This document serves as a record of services personally performed by Kyung Rudd, MD. It was created on  his behalf by Arlyce Harman, a trained medical scribe. The creation of this record is based on the scribe's personal observations and the provider's statements to them. This document has been checked and approved by the attending provider.  ------------------------------------------------  Jodelle Gross, MD, PhD

## 2016-07-31 NOTE — Progress Notes (Signed)
Weekly rad txs left breast 10/17 completed, skin slight pink on vreast and under axilla, intact, using radiaplex bid, no c/o pain feeling good 10:01 AM BP (!) 175/63 (BP Location: Right Arm, Patient Position: Sitting, Cuff Size: Normal)   Pulse 75   Temp 98 F (36.7 C) (Oral)   Wt 129 lb 9.6 oz (58.8 kg)   BMI 22.96 kg/m   Wt Readings from Last 3 Encounters:  07/31/16 129 lb 9.6 oz (58.8 kg)  07/24/16 128 lb 8 oz (58.3 kg)  07/21/16 126 lb 3.2 oz (57.2 kg)

## 2016-08-03 ENCOUNTER — Ambulatory Visit
Admission: RE | Admit: 2016-08-03 | Discharge: 2016-08-03 | Disposition: A | Discharge status: Home / Self Care | Payer: Medicare Other | Source: Ambulatory Visit | Attending: Radiation Oncology | Admitting: Radiation Oncology

## 2016-08-03 DIAGNOSIS — Z170 Estrogen receptor positive status [ER+]: Secondary | ICD-10-CM | POA: Diagnosis not present

## 2016-08-03 DIAGNOSIS — I10 Essential (primary) hypertension: Secondary | ICD-10-CM | POA: Diagnosis not present

## 2016-08-03 DIAGNOSIS — M1990 Unspecified osteoarthritis, unspecified site: Secondary | ICD-10-CM | POA: Diagnosis not present

## 2016-08-03 DIAGNOSIS — Z79899 Other long term (current) drug therapy: Secondary | ICD-10-CM | POA: Diagnosis not present

## 2016-08-03 DIAGNOSIS — Z510 Encounter for antineoplastic radiation therapy: Secondary | ICD-10-CM | POA: Diagnosis not present

## 2016-08-03 DIAGNOSIS — C50212 Malignant neoplasm of upper-inner quadrant of left female breast: Secondary | ICD-10-CM | POA: Diagnosis not present

## 2016-08-04 ENCOUNTER — Ambulatory Visit
Admission: RE | Admit: 2016-08-04 | Discharge: 2016-08-04 | Disposition: A | Discharge status: Home / Self Care | Payer: Medicare Other | Source: Ambulatory Visit | Attending: Radiation Oncology | Admitting: Radiation Oncology

## 2016-08-04 DIAGNOSIS — I10 Essential (primary) hypertension: Secondary | ICD-10-CM | POA: Diagnosis not present

## 2016-08-04 DIAGNOSIS — C50212 Malignant neoplasm of upper-inner quadrant of left female breast: Secondary | ICD-10-CM | POA: Diagnosis not present

## 2016-08-04 DIAGNOSIS — Z170 Estrogen receptor positive status [ER+]: Secondary | ICD-10-CM | POA: Diagnosis not present

## 2016-08-04 DIAGNOSIS — Z510 Encounter for antineoplastic radiation therapy: Secondary | ICD-10-CM | POA: Diagnosis not present

## 2016-08-04 DIAGNOSIS — Z79899 Other long term (current) drug therapy: Secondary | ICD-10-CM | POA: Diagnosis not present

## 2016-08-04 DIAGNOSIS — M1990 Unspecified osteoarthritis, unspecified site: Secondary | ICD-10-CM | POA: Diagnosis not present

## 2016-08-05 ENCOUNTER — Ambulatory Visit
Admission: RE | Admit: 2016-08-05 | Discharge: 2016-08-05 | Disposition: A | Discharge status: Home / Self Care | Payer: Medicare Other | Source: Ambulatory Visit | Attending: Radiation Oncology | Admitting: Radiation Oncology

## 2016-08-05 ENCOUNTER — Encounter: Payer: Self-pay | Admitting: Radiation Oncology

## 2016-08-05 VITALS — BP 150/57 | HR 72 | Temp 99.2°F | Wt 127.7 lb

## 2016-08-05 DIAGNOSIS — M1990 Unspecified osteoarthritis, unspecified site: Secondary | ICD-10-CM | POA: Diagnosis not present

## 2016-08-05 DIAGNOSIS — Z170 Estrogen receptor positive status [ER+]: Secondary | ICD-10-CM | POA: Diagnosis not present

## 2016-08-05 DIAGNOSIS — Z510 Encounter for antineoplastic radiation therapy: Secondary | ICD-10-CM | POA: Diagnosis not present

## 2016-08-05 DIAGNOSIS — I10 Essential (primary) hypertension: Secondary | ICD-10-CM | POA: Diagnosis not present

## 2016-08-05 DIAGNOSIS — C50212 Malignant neoplasm of upper-inner quadrant of left female breast: Secondary | ICD-10-CM | POA: Diagnosis not present

## 2016-08-05 DIAGNOSIS — Z79899 Other long term (current) drug therapy: Secondary | ICD-10-CM | POA: Diagnosis not present

## 2016-08-05 MED ORDER — RADIAPLEXRX EX GEL
Freq: Once | CUTANEOUS | Status: AC
  Administered 2016-08-05: 10:00:00 via TOPICAL

## 2016-08-05 NOTE — Progress Notes (Signed)
  Radiation Oncology         [phone]) [phone] ________________________________  Name: Charlotte [name] MRN: [id number]  Date: 07/24/2016  DOB: [birth year]  Complex simulation note  The patient has undergone complex simulation for her upcoming boost treatment for her diagnosis of breast cancer. The patient has initially been planned to receive 42.5 Gy. The patient will now receive a 7.5 Gy boost to the seroma cavity which has been contoured. This will be accomplished using an en face electron field. Based on the depth of the target area, 12 and 6 MeV electrons will be used. The patient's final total dose therefore will be 50 Gy. A complex isodose plan from the electron [facility] calculation is requested for the boost treatment.   _______________________________  Jodelle Gross, MD, PhD

## 2016-08-05 NOTE — Progress Notes (Signed)
Department of Radiation Oncology  Phone:  [phone] Fax:        [phone]  Weekly Treatment Note    Name: Charlotte [name] Date: 08/05/2016 MRN: [id number] DOB: 1928-08-15   Diagnosis:     ICD-9-CM ICD-10-CM   1. Malignant neoplasm of upper-inner quadrant of left female breast ([facility]) 174.2 C50.212 hyaluronate sodium (RADIAPLEXRX) gel  2. Malignant neoplasm of upper-inner quadrant of left female breast ([facility]) 174.2 C50.212 hyaluronate sodium (RADIAPLEXRX) gel     Current dose: 32.5 Gy  Current fraction: 13   MEDICATIONS: Current Outpatient Prescriptions  Medication Sig Dispense Refill  . acetaminophen (TYLENOL) 500 MG tablet Take 1,000 mg by mouth daily as needed for mild pain. Reported on 01/27/2016    . aspirin EC 81 MG tablet Take 81 mg by mouth daily.    . Calcium Carbonate-Vitamin D (CALCIUM 600+D3 PO) Take 1 tablet by mouth 2 (two) times daily.    . carbamide peroxide (DEBROX) 6.5 % otic solution Place 1 drop into both ears daily as needed (ear wax buildup).    . cycloSPORINE (RESTASIS) 0.05 % ophthalmic emulsion Place 1 drop into both eyes 2 (two) times daily.    . diphenhydramine-acetaminophen (TYLENOL PM) 25-500 MG TABS tablet Take 1 tablet by mouth at bedtime as needed (sleep).    . felodipine (PLENDIL) 5 MG 24 hr tablet Take 5 mg by mouth daily.     . fluticasone (FLONASE) 50 MCG/ACT nasal spray Place 2 sprays into the nose daily as needed for allergies.    . hyaluronate sodium (RADIAPLEXRX) GEL Apply 1 application topically 2 (two) times daily.    . hydrALAZINE (APRESOLINE) 50 MG tablet Take 1 tablet (50 mg total) by mouth 2 (two) times daily. 180 tablet 3  . lidocaine-prilocaine (EMLA) cream Apply 1 application topically as needed. 30 g 1  . Liniments (SALONPAS PAIN RELIEF PATCH) PADS Apply 1 each topically daily as needed (pain).    . LORazepam (ATIVAN) 1 MG tablet Take 0.5 mg by mouth every 8 (eight) hours as needed for anxiety or sleep.     Marland Kitchen losartan (COZAAR)  100 MG tablet Take 100 mg by mouth daily.    . Melatonin 5 MG TABS Take 5 mg by mouth at bedtime as needed (sleep).     . Multiple Vitamins-Minerals (CENTRUM ADULTS PO) Take 1 tablet by mouth daily.    . non-metallic deodorant Jethro Poling) MISC Apply 1 application topically daily as needed.    . Omega-3 Fatty Acids (FISH OIL TRIPLE STRENGTH) 1400 MG CAPS Take 1 tablet by mouth daily.    Vladimir Faster Glycol-Propyl Glycol (SYSTANE) 0.4-0.3 % SOLN Apply 1 drop to eye 2 (two) times daily.    Marland Kitchen tretinoin (RETIN-A) 0.025 % cream Apply 1 application topically at bedtime.   3   Current Facility-Administered Medications  Medication Dose Route Frequency Provider Last Rate Last Dose  . hyaluronate sodium (RADIAPLEXRX) gel   Topical Once Hayden Pedro, PA-C         ALLERGIES: Latex; Bextra [valdecoxib]; and Motrin [ibuprofen]   LABORATORY DATA:  Lab Results  Component Value Date   WBC 5.7 07/21/2016   HGB 12.9 07/21/2016   HCT 39.1 07/21/2016   MCV 88.4 07/21/2016   PLT 347 07/21/2016   Lab Results  Component Value Date   NA 136 07/21/2016   K 3.7 07/21/2016   CL 98 (L) 07/16/2016   CO2 31 (H) 07/21/2016   Lab Results  Component Value Date  ALT 22 07/21/2016   AST 18 07/21/2016   ALKPHOS 78 07/21/2016   BILITOT 0.38 07/21/2016     NARRATIVE: Charlotte [name] was seen today for weekly treatment management. The chart was checked and the patient's films were reviewed.  The patient is doing very well. She has felt some fatigue this week. No pain.  10:24 AM Wt Readings from Last 3 Encounters:  08/05/16 127 lb 11.2 oz (57.9 kg)  07/31/16 129 lb 9.6 oz (58.8 kg)  07/24/16 128 lb 8 oz (58.3 kg)    PHYSICAL EXAMINATION: weight is 127 lb 11.2 oz (57.9 kg). Her oral temperature is 99.2 F (37.3 C). Her blood pressure is 150/57 (abnormal) and her pulse is 72.      Mild erythema in the treatment area. Mild dermatitis present  ASSESSMENT: The patient is doing satisfactorily with  treatment.  PLAN: We will continue with the patient's radiation treatment as planned.   ------------------------------------------------  Jodelle Gross, MD, PhD    This document serves as a record of services personally performed by Kyung Rudd, MD. It was created on his behalf by Lendon Collar, a trained medical scribe. The creation of this record is based on the scribe's personal observations and the provider's statements to them. This document has been checked and approved by the attending provider.

## 2016-08-05 NOTE — Progress Notes (Signed)
Weekly rad tx  Left breast 13/17 completd, mild erythema , skin intact, gave another radiaplex cream as requested, no c/o pain, but is getting faqtigued, and rests in the afternoons, appetitte good drinks plenty water stated 10:13 AM BP (!) 150/57 (BP Location: Right Leg, Patient Position: Sitting, Cuff Size: Normal)   Pulse 72   Temp 99.2 F (37.3 C) (Oral)   Wt 127 lb 11.2 oz (57.9 kg)   BMI 22.62 kg/m   Wt Readings from Last 3 Encounters:  08/05/16 127 lb 11.2 oz (57.9 kg)  07/31/16 129 lb 9.6 oz (58.8 kg)  07/24/16 128 lb 8 oz (58.3 kg)

## 2016-08-06 ENCOUNTER — Ambulatory Visit
Admission: RE | Admit: 2016-08-06 | Discharge: 2016-08-06 | Disposition: A | Discharge status: Home / Self Care | Payer: Medicare Other | Source: Ambulatory Visit | Attending: Radiation Oncology | Admitting: Radiation Oncology

## 2016-08-06 DIAGNOSIS — C50212 Malignant neoplasm of upper-inner quadrant of left female breast: Secondary | ICD-10-CM | POA: Diagnosis not present

## 2016-08-06 DIAGNOSIS — Z510 Encounter for antineoplastic radiation therapy: Secondary | ICD-10-CM | POA: Diagnosis not present

## 2016-08-06 DIAGNOSIS — M1990 Unspecified osteoarthritis, unspecified site: Secondary | ICD-10-CM | POA: Diagnosis not present

## 2016-08-06 DIAGNOSIS — Z170 Estrogen receptor positive status [ER+]: Secondary | ICD-10-CM | POA: Diagnosis not present

## 2016-08-06 DIAGNOSIS — Z79899 Other long term (current) drug therapy: Secondary | ICD-10-CM | POA: Diagnosis not present

## 2016-08-06 DIAGNOSIS — I10 Essential (primary) hypertension: Secondary | ICD-10-CM | POA: Diagnosis not present

## 2016-08-07 ENCOUNTER — Ambulatory Visit
Admission: RE | Admit: 2016-08-07 | Discharge: 2016-08-07 | Disposition: A | Discharge status: Home / Self Care | Payer: Medicare Other | Source: Ambulatory Visit | Attending: Radiation Oncology | Admitting: Radiation Oncology

## 2016-08-07 DIAGNOSIS — I10 Essential (primary) hypertension: Secondary | ICD-10-CM | POA: Diagnosis not present

## 2016-08-07 DIAGNOSIS — M1990 Unspecified osteoarthritis, unspecified site: Secondary | ICD-10-CM | POA: Diagnosis not present

## 2016-08-07 DIAGNOSIS — Z510 Encounter for antineoplastic radiation therapy: Secondary | ICD-10-CM | POA: Diagnosis not present

## 2016-08-07 DIAGNOSIS — Z170 Estrogen receptor positive status [ER+]: Secondary | ICD-10-CM | POA: Diagnosis not present

## 2016-08-07 DIAGNOSIS — Z79899 Other long term (current) drug therapy: Secondary | ICD-10-CM | POA: Diagnosis not present

## 2016-08-07 DIAGNOSIS — C50212 Malignant neoplasm of upper-inner quadrant of left female breast: Secondary | ICD-10-CM | POA: Diagnosis not present

## 2016-08-10 ENCOUNTER — Ambulatory Visit
Admission: RE | Admit: 2016-08-10 | Discharge: 2016-08-10 | Disposition: A | Discharge status: Home / Self Care | Payer: Medicare Other | Source: Ambulatory Visit | Attending: Radiation Oncology | Admitting: Radiation Oncology

## 2016-08-10 ENCOUNTER — Ambulatory Visit: Payer: Medicare Other | Admitting: Radiation Oncology

## 2016-08-10 DIAGNOSIS — C50212 Malignant neoplasm of upper-inner quadrant of left female breast: Secondary | ICD-10-CM | POA: Diagnosis not present

## 2016-08-10 DIAGNOSIS — I10 Essential (primary) hypertension: Secondary | ICD-10-CM | POA: Diagnosis not present

## 2016-08-10 DIAGNOSIS — Z510 Encounter for antineoplastic radiation therapy: Secondary | ICD-10-CM | POA: Diagnosis not present

## 2016-08-10 DIAGNOSIS — Z170 Estrogen receptor positive status [ER+]: Secondary | ICD-10-CM | POA: Diagnosis not present

## 2016-08-10 DIAGNOSIS — Z79899 Other long term (current) drug therapy: Secondary | ICD-10-CM | POA: Diagnosis not present

## 2016-08-10 DIAGNOSIS — M1990 Unspecified osteoarthritis, unspecified site: Secondary | ICD-10-CM | POA: Diagnosis not present

## 2016-08-11 ENCOUNTER — Other Ambulatory Visit (HOSPITAL_BASED_OUTPATIENT_CLINIC_OR_DEPARTMENT_OTHER): Payer: Medicare Other

## 2016-08-11 ENCOUNTER — Ambulatory Visit: Payer: Self-pay | Admitting: Hematology and Oncology

## 2016-08-11 ENCOUNTER — Ambulatory Visit
Admission: RE | Admit: 2016-08-11 | Discharge: 2016-08-11 | Disposition: A | Discharge status: Home / Self Care | Payer: Medicare Other | Source: Ambulatory Visit | Attending: Radiation Oncology | Admitting: Radiation Oncology

## 2016-08-11 ENCOUNTER — Ambulatory Visit (HOSPITAL_BASED_OUTPATIENT_CLINIC_OR_DEPARTMENT_OTHER): Payer: Medicare Other

## 2016-08-11 VITALS — BP 159/62 | HR 61 | Temp 98.6°F | Resp 19

## 2016-08-11 DIAGNOSIS — I10 Essential (primary) hypertension: Secondary | ICD-10-CM | POA: Diagnosis not present

## 2016-08-11 DIAGNOSIS — C50412 Malignant neoplasm of upper-outer quadrant of left female breast: Secondary | ICD-10-CM

## 2016-08-11 DIAGNOSIS — C50212 Malignant neoplasm of upper-inner quadrant of left female breast: Secondary | ICD-10-CM

## 2016-08-11 DIAGNOSIS — Z5112 Encounter for antineoplastic immunotherapy: Secondary | ICD-10-CM

## 2016-08-11 DIAGNOSIS — Z79899 Other long term (current) drug therapy: Secondary | ICD-10-CM | POA: Diagnosis not present

## 2016-08-11 DIAGNOSIS — M1990 Unspecified osteoarthritis, unspecified site: Secondary | ICD-10-CM | POA: Diagnosis not present

## 2016-08-11 DIAGNOSIS — Z170 Estrogen receptor positive status [ER+]: Secondary | ICD-10-CM | POA: Diagnosis not present

## 2016-08-11 DIAGNOSIS — Z510 Encounter for antineoplastic radiation therapy: Secondary | ICD-10-CM | POA: Diagnosis not present

## 2016-08-11 LAB — COMPREHENSIVE METABOLIC PANEL
ALBUMIN: 3.6 g/dL (ref 3.5–5.0)
ALK PHOS: 68 U/L (ref 40–150)
ALT: 20 U/L (ref 0–55)
ANION GAP: 9 meq/L (ref 3–11)
AST: 20 U/L (ref 5–34)
BILIRUBIN TOTAL: 0.39 mg/dL (ref 0.20–1.20)
BUN: 12.8 mg/dL (ref 7.0–26.0)
CALCIUM: 10.1 mg/dL (ref 8.4–10.4)
CO2: 30 mEq/L — ABNORMAL HIGH (ref 22–29)
Chloride: 98 mEq/L (ref 98–109)
Creatinine: 1 mg/dL (ref 0.6–1.1)
EGFR: 56 mL/min/{1.73_m2} — AB (ref >90)
GLUCOSE: 92 mg/dL (ref 70–140)
Potassium: 3.8 mEq/L (ref 3.5–5.1)
Sodium: 137 mEq/L (ref 136–145)
TOTAL PROTEIN: 7.5 g/dL (ref 6.4–8.3)

## 2016-08-11 LAB — CBC WITH DIFFERENTIAL/PLATELET
BASO%: 0.2 % (ref 0.0–2.0)
Basophils Absolute: 0 10*3/uL (ref 0.0–0.1)
EOS ABS: 0 10*3/uL (ref 0.0–0.5)
EOS%: 0.5 % (ref 0.0–7.0)
HEMATOCRIT: 35.4 % (ref 34.8–46.6)
HEMOGLOBIN: 11.8 g/dL (ref 11.6–15.9)
LYMPH%: 18.5 % (ref 14.0–49.7)
MCH: 29.4 pg (ref 25.1–34.0)
MCHC: 33.3 g/dL (ref 31.5–36.0)
MCV: 88.1 fL (ref 79.5–101.0)
MONO#: 0.4 10*3/uL (ref 0.1–0.9)
MONO%: 10 % (ref 0.0–14.0)
NEUT%: 70.8 % (ref 38.4–76.8)
NEUTROS ABS: 2.8 10*3/uL (ref 1.5–6.5)
PLATELETS: 278 10*3/uL (ref 145–400)
RBC: 4.02 10*6/uL (ref 3.70–5.45)
RDW: 14.7 % — AB (ref 11.2–14.5)
WBC: 4 10*3/uL (ref 3.9–10.3)
lymph#: 0.7 10*3/uL — ABNORMAL LOW (ref 0.9–3.3)

## 2016-08-11 MED ORDER — TRASTUZUMAB CHEMO 150 MG IV SOLR
6.0000 mg/kg | Freq: Once | INTRAVENOUS | Status: AC
  Administered 2016-08-11: 336 mg via INTRAVENOUS
  Filled 2016-08-11: qty 16

## 2016-08-11 MED ORDER — SODIUM CHLORIDE 0.9% FLUSH
10.0000 mL | INTRAVENOUS | Status: DC | PRN
  Administered 2016-08-11: 10 mL
  Filled 2016-08-11: qty 10

## 2016-08-11 MED ORDER — ACETAMINOPHEN 325 MG PO TABS
ORAL_TABLET | ORAL | Status: AC
  Filled 2016-08-11: qty 2

## 2016-08-11 MED ORDER — ACETAMINOPHEN 325 MG PO TABS
650.0000 mg | ORAL_TABLET | Freq: Once | ORAL | Status: AC
  Administered 2016-08-11: 650 mg via ORAL

## 2016-08-11 MED ORDER — DIPHENHYDRAMINE HCL 25 MG PO CAPS
50.0000 mg | ORAL_CAPSULE | Freq: Once | ORAL | Status: AC
  Administered 2016-08-11: 50 mg via ORAL

## 2016-08-11 MED ORDER — HEPARIN SOD (PORK) LOCK FLUSH 100 UNIT/ML IV SOLN
500.0000 [IU] | Freq: Once | INTRAVENOUS | Status: AC | PRN
  Administered 2016-08-11: 500 [IU]
  Filled 2016-08-11: qty 5

## 2016-08-11 MED ORDER — SODIUM CHLORIDE 0.9 % IV SOLN
Freq: Once | INTRAVENOUS | Status: AC
  Administered 2016-08-11: 11:00:00 via INTRAVENOUS

## 2016-08-11 MED ORDER — DIPHENHYDRAMINE HCL 25 MG PO CAPS
ORAL_CAPSULE | ORAL | Status: AC
  Filled 2016-08-11: qty 2

## 2016-08-11 NOTE — Patient Instructions (Signed)
[facility] Discharge Instructions for Patients Receiving Chemotherapy  Today you received the following chemotherapy agents:  Herceptin  To help prevent nausea and vomiting after your treatment, we encourage you to take your nausea medication as prescribed.   If you develop nausea and vomiting that is not controlled by your nausea medication, call the clinic.   BELOW ARE SYMPTOMS THAT SHOULD BE REPORTED IMMEDIATELY:  *FEVER GREATER THAN 100.5 F  *CHILLS WITH OR WITHOUT FEVER  NAUSEA AND VOMITING THAT IS NOT CONTROLLED WITH YOUR NAUSEA MEDICATION  *UNUSUAL SHORTNESS OF BREATH  *UNUSUAL BRUISING OR BLEEDING  TENDERNESS IN MOUTH AND THROAT WITH OR WITHOUT PRESENCE OF ULCERS  *URINARY PROBLEMS  *BOWEL PROBLEMS  UNUSUAL RASH Items with * indicate a potential emergency and should be followed up as soon as possible.  Feel free to call the clinic you have any questions or concerns. The clinic phone number is [phone].  Please show the CHEMO ALERT CARD at check-in to the Emergency Department and triage nurse.   

## 2016-08-12 ENCOUNTER — Ambulatory Visit
Admission: RE | Admit: 2016-08-12 | Discharge: 2016-08-12 | Disposition: A | Discharge status: Home / Self Care | Payer: Medicare Other | Source: Ambulatory Visit | Attending: Radiation Oncology | Admitting: Radiation Oncology

## 2016-08-12 DIAGNOSIS — Z79899 Other long term (current) drug therapy: Secondary | ICD-10-CM | POA: Diagnosis not present

## 2016-08-12 DIAGNOSIS — M1990 Unspecified osteoarthritis, unspecified site: Secondary | ICD-10-CM | POA: Diagnosis not present

## 2016-08-12 DIAGNOSIS — C50212 Malignant neoplasm of upper-inner quadrant of left female breast: Secondary | ICD-10-CM | POA: Diagnosis not present

## 2016-08-12 DIAGNOSIS — Z170 Estrogen receptor positive status [ER+]: Secondary | ICD-10-CM | POA: Diagnosis not present

## 2016-08-12 DIAGNOSIS — I10 Essential (primary) hypertension: Secondary | ICD-10-CM | POA: Diagnosis not present

## 2016-08-12 DIAGNOSIS — Z510 Encounter for antineoplastic radiation therapy: Secondary | ICD-10-CM | POA: Diagnosis not present

## 2016-08-13 ENCOUNTER — Ambulatory Visit
Admission: RE | Admit: 2016-08-13 | Discharge: 2016-08-13 | Disposition: A | Discharge status: Home / Self Care | Payer: Medicare Other | Source: Ambulatory Visit | Attending: Radiation Oncology | Admitting: Radiation Oncology

## 2016-08-13 DIAGNOSIS — Z79899 Other long term (current) drug therapy: Secondary | ICD-10-CM | POA: Diagnosis not present

## 2016-08-13 DIAGNOSIS — Z170 Estrogen receptor positive status [ER+]: Secondary | ICD-10-CM | POA: Diagnosis not present

## 2016-08-13 DIAGNOSIS — C50212 Malignant neoplasm of upper-inner quadrant of left female breast: Secondary | ICD-10-CM | POA: Diagnosis not present

## 2016-08-13 DIAGNOSIS — Z510 Encounter for antineoplastic radiation therapy: Secondary | ICD-10-CM | POA: Diagnosis not present

## 2016-08-13 DIAGNOSIS — I10 Essential (primary) hypertension: Secondary | ICD-10-CM | POA: Diagnosis not present

## 2016-08-13 DIAGNOSIS — M1990 Unspecified osteoarthritis, unspecified site: Secondary | ICD-10-CM | POA: Diagnosis not present

## 2016-08-14 ENCOUNTER — Ambulatory Visit
Admission: RE | Admit: 2016-08-14 | Discharge: 2016-08-14 | Disposition: A | Discharge status: Home / Self Care | Payer: Medicare Other | Source: Ambulatory Visit | Attending: Radiation Oncology | Admitting: Radiation Oncology

## 2016-08-14 ENCOUNTER — Encounter: Payer: Self-pay | Admitting: Radiation Oncology

## 2016-08-14 VITALS — BP 154/53 | HR 73 | Temp 98.4°F | Ht 63.0 in | Wt 129.2 lb

## 2016-08-14 DIAGNOSIS — Z170 Estrogen receptor positive status [ER+]: Secondary | ICD-10-CM | POA: Diagnosis not present

## 2016-08-14 DIAGNOSIS — M1990 Unspecified osteoarthritis, unspecified site: Secondary | ICD-10-CM | POA: Diagnosis not present

## 2016-08-14 DIAGNOSIS — C50212 Malignant neoplasm of upper-inner quadrant of left female breast: Secondary | ICD-10-CM | POA: Diagnosis not present

## 2016-08-14 DIAGNOSIS — Z510 Encounter for antineoplastic radiation therapy: Secondary | ICD-10-CM | POA: Diagnosis not present

## 2016-08-14 DIAGNOSIS — Z79899 Other long term (current) drug therapy: Secondary | ICD-10-CM | POA: Diagnosis not present

## 2016-08-14 DIAGNOSIS — I10 Essential (primary) hypertension: Secondary | ICD-10-CM | POA: Diagnosis not present

## 2016-08-14 NOTE — Progress Notes (Signed)
Charlotte [name] has received 20 fractions to her left breast.  She denies any pain in her treatment field.  Note erythema in he left upper chest with intact skin.  She admits to fatigue with naps during the day.      BP (!) 154/53 (BP Location: Right Arm, Patient Position: Sitting, Cuff Size: Normal)   Pulse 73   Temp 98.4 F (36.9 C)   Ht 5\' 3"  (1.6 m)   Wt 129 lb 3.2 oz (58.6 kg)   BMI 22.89 kg/m

## 2016-08-14 NOTE — Progress Notes (Signed)
Department of Radiation Oncology  Phone:  [phone] Fax:        [phone]  Weekly Treatment Note    Name: Charlotte [name] Date: 08/16/2016 MRN: [id number] DOB: [birth year]   Current dose: 50 Gy  Current fraction:20   MEDICATIONS: Current Outpatient Prescriptions  Medication Sig Dispense Refill  . acetaminophen (TYLENOL) 500 MG tablet Take 1,000 mg by mouth daily as needed for mild pain. Reported on 01/27/2016    . aspirin EC 81 MG tablet Take 81 mg by mouth daily.    . Calcium Carbonate-Vitamin D (CALCIUM 600+D3 PO) Take 1 tablet by mouth 2 (two) times daily.    . carbamide peroxide (DEBROX) 6.5 % otic solution Place 1 drop into both ears daily as needed (ear wax buildup).    . cycloSPORINE (RESTASIS) 0.05 % ophthalmic emulsion Place 1 drop into both eyes 2 (two) times daily.    . diphenhydramine-acetaminophen (TYLENOL PM) 25-500 MG TABS tablet Take 1 tablet by mouth at bedtime as needed (sleep).    . felodipine (PLENDIL) 5 MG 24 hr tablet Take 5 mg by mouth daily.     . fluticasone (FLONASE) 50 MCG/ACT nasal spray Place 2 sprays into the nose daily as needed for allergies.    . hyaluronate sodium (RADIAPLEXRX) GEL Apply 1 application topically 2 (two) times daily.    . hydrALAZINE (APRESOLINE) 50 MG tablet Take 1 tablet (50 mg total) by mouth 2 (two) times daily. 180 tablet 3  . lidocaine-prilocaine (EMLA) cream Apply 1 application topically as needed. 30 g 1  . Liniments (SALONPAS PAIN RELIEF PATCH) PADS Apply 1 each topically daily as needed (pain).    . LORazepam (ATIVAN) 1 MG tablet Take 0.5 mg by mouth every 8 (eight) hours as needed for anxiety or sleep.     Marland Kitchen losartan (COZAAR) 100 MG tablet Take 100 mg by mouth daily.    . Multiple Vitamins-Minerals (CENTRUM ADULTS PO) Take 1 tablet by mouth daily.    . non-metallic deodorant Jethro Poling) MISC Apply 1 application topically daily as needed.    . Omega-3 Fatty Acids (FISH OIL TRIPLE STRENGTH) 1400 MG CAPS Take 1 tablet by mouth  daily.    Vladimir Faster Glycol-Propyl Glycol (SYSTANE) 0.4-0.3 % SOLN Apply 1 drop to eye 2 (two) times daily.    Marland Kitchen tretinoin (RETIN-A) 0.025 % cream Apply 1 application topically at bedtime.   3  . Melatonin 5 MG TABS Take 5 mg by mouth at bedtime as needed (sleep).      No current facility-administered medications for this encounter.      ALLERGIES: Latex; Bextra [valdecoxib]; and Motrin [ibuprofen]   LABORATORY DATA:  Lab Results  Component Value Date   WBC 4.0 08/11/2016   HGB 11.8 08/11/2016   HCT 35.4 08/11/2016   MCV 88.1 08/11/2016   PLT 278 08/11/2016   Lab Results  Component Value Date   NA 137 08/11/2016   K 3.8 08/11/2016   CL 98 (L) 07/16/2016   CO2 30 (H) 08/11/2016   Lab Results  Component Value Date   ALT 20 08/11/2016   AST 20 08/11/2016   ALKPHOS 68 08/11/2016   BILITOT 0.39 08/11/2016     NARRATIVE: Charlotte [name] was seen today for weekly treatment management. The chart was checked and the patient's films were reviewed.  Ms. Charlotte [name] has received 20 fractions to her left breast.  She denies any pain in her treatment field. The nurse notes erythema in he left upper chest  with intact skin.  She admits fatigue with naps during the day.    PHYSICAL EXAMINATION: height is 5\' 3"  (1.6 m) and weight is 129 lb 3.2 oz (58.6 kg). Her temperature is 98.4 F (36.9 C). Her blood pressure is 154/53 (abnormal) and her pulse is 73.   ASSESSMENT: The patient did satisfactorily with treatment.  PLAN: The patient will follow-up in our clinic in 1 month.  This document serves as a record of services personally performed by Kyung Rudd, MD. It was created on his behalf by Darcus Austin, a trained medical scribe. The creation of this record is based on the scribe's personal observations and the provider's statements to them. This document has been checked and approved by the attending provider.

## 2016-08-17 ENCOUNTER — Telehealth: Payer: Self-pay | Admitting: *Deleted

## 2016-08-17 NOTE — Telephone Encounter (Signed)
  Oncology Nurse Navigator Documentation  Navigator Location: [facility] (08/17/16 1400) Navigator Encounter Type: Telephone (08/17/16 1400) Telephone: [place] Call (08/17/16 1400)         Patient Visit Type: [id number] (08/17/16 1400) Treatment Phase: Final Radiation Tx (08/17/16 1400)                            Time Spent with Patient: 15 (08/17/16 1400)

## 2016-08-19 ENCOUNTER — Encounter: Payer: Self-pay | Admitting: Radiation Oncology

## 2016-08-19 NOTE — Progress Notes (Signed)
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 Left breast boost// 7.5 Gy at 3 fractions for a dose of 2.5 Gy/ fraction 2. Left breast// 42.5 Gy at 17 fractions for a dose of 2.5 Gy/ fraction  Beams/energy:   1. Isodose plan// 12 MeV, 6 MeV 2. 3D // 10 X, 6 X  Narrative: The patient tolerated radiation treatment relatively well.   Plan: The patient has completed radiation treatment. The patient will return to radiation oncology clinic for routine followup in one month. I advised them to call or return sooner if they have any questions or concerns related to their recovery or treatment.  ------------------------------------------------  Jodelle Gross, MD, PhD  This document serves as a record of services personally performed by Kyung Rudd, MD. It was created on his behalf by Bethann Humble, a trained medical scribe. The creation of this record is based on the scribe's personal observations and the provider's statements to them. This document has been checked and approved by the attending provider.

## 2016-08-31 ENCOUNTER — Ambulatory Visit: Payer: Self-pay | Admitting: Cardiology

## 2016-09-01 ENCOUNTER — Encounter: Payer: Self-pay | Admitting: Hematology and Oncology

## 2016-09-01 ENCOUNTER — Other Ambulatory Visit (HOSPITAL_BASED_OUTPATIENT_CLINIC_OR_DEPARTMENT_OTHER): Payer: Medicare Other

## 2016-09-01 ENCOUNTER — Ambulatory Visit (HOSPITAL_BASED_OUTPATIENT_CLINIC_OR_DEPARTMENT_OTHER): Payer: Medicare Other

## 2016-09-01 ENCOUNTER — Ambulatory Visit (HOSPITAL_BASED_OUTPATIENT_CLINIC_OR_DEPARTMENT_OTHER): Payer: Medicare Other | Admitting: Hematology and Oncology

## 2016-09-01 DIAGNOSIS — Z5112 Encounter for antineoplastic immunotherapy: Secondary | ICD-10-CM | POA: Diagnosis present

## 2016-09-01 DIAGNOSIS — Z23 Encounter for immunization: Secondary | ICD-10-CM

## 2016-09-01 DIAGNOSIS — C50212 Malignant neoplasm of upper-inner quadrant of left female breast: Secondary | ICD-10-CM

## 2016-09-01 DIAGNOSIS — C50412 Malignant neoplasm of upper-outer quadrant of left female breast: Secondary | ICD-10-CM

## 2016-09-01 LAB — COMPREHENSIVE METABOLIC PANEL
ALBUMIN: 3.5 g/dL (ref 3.5–5.0)
ALK PHOS: 77 U/L (ref 40–150)
ALT: 20 U/L (ref 0–55)
AST: 18 U/L (ref 5–34)
Anion Gap: 10 mEq/L (ref 3–11)
BILIRUBIN TOTAL: 0.4 mg/dL (ref 0.20–1.20)
BUN: 17.4 mg/dL (ref 7.0–26.0)
CALCIUM: 9.9 mg/dL (ref 8.4–10.4)
CO2: 28 mEq/L (ref 22–29)
Chloride: 98 mEq/L (ref 98–109)
Creatinine: 1 mg/dL (ref 0.6–1.1)
EGFR: 57 mL/min/{1.73_m2} — AB (ref >90)
GLUCOSE: 103 mg/dL (ref 70–140)
Potassium: 4.2 mEq/L (ref 3.5–5.1)
SODIUM: 136 meq/L (ref 136–145)
TOTAL PROTEIN: 7.7 g/dL (ref 6.4–8.3)

## 2016-09-01 LAB — CBC WITH DIFFERENTIAL/PLATELET
BASO%: 0.7 % (ref 0.0–2.0)
BASOS ABS: 0 10*3/uL (ref 0.0–0.1)
EOS ABS: 0 10*3/uL (ref 0.0–0.5)
EOS%: 0.5 % (ref 0.0–7.0)
HEMATOCRIT: 37.3 % (ref 34.8–46.6)
HEMOGLOBIN: 12.2 g/dL (ref 11.6–15.9)
LYMPH#: 0.7 10*3/uL — AB (ref 0.9–3.3)
LYMPH%: 15.6 % (ref 14.0–49.7)
MCH: 28.7 pg (ref 25.1–34.0)
MCHC: 32.6 g/dL (ref 31.5–36.0)
MCV: 88.1 fL (ref 79.5–101.0)
MONO#: 0.4 10*3/uL (ref 0.1–0.9)
MONO%: 8.7 % (ref 0.0–14.0)
NEUT%: 74.5 % (ref 38.4–76.8)
NEUTROS ABS: 3.4 10*3/uL (ref 1.5–6.5)
Platelets: 347 10*3/uL (ref 145–400)
RBC: 4.24 10*6/uL (ref 3.70–5.45)
RDW: 15 % — AB (ref 11.2–14.5)
WBC: 4.6 10*3/uL (ref 3.9–10.3)

## 2016-09-01 MED ORDER — ACETAMINOPHEN 325 MG PO TABS
650.0000 mg | ORAL_TABLET | Freq: Once | ORAL | Status: AC
  Administered 2016-09-01: 650 mg via ORAL

## 2016-09-01 MED ORDER — SODIUM CHLORIDE 0.9 % IV SOLN
Freq: Once | INTRAVENOUS | Status: AC
  Administered 2016-09-01: 12:00:00 via INTRAVENOUS

## 2016-09-01 MED ORDER — DIPHENHYDRAMINE HCL 25 MG PO CAPS
50.0000 mg | ORAL_CAPSULE | Freq: Once | ORAL | Status: AC
  Administered 2016-09-01: 50 mg via ORAL

## 2016-09-01 MED ORDER — INFLUENZA VAC SPLIT QUAD 0.5 ML IM SUSY
0.5000 mL | PREFILLED_SYRINGE | Freq: Once | INTRAMUSCULAR | Status: AC
  Administered 2016-09-01: 0.5 mL via INTRAMUSCULAR
  Filled 2016-09-01: qty 0.5

## 2016-09-01 MED ORDER — HEPARIN SOD (PORK) LOCK FLUSH 100 UNIT/ML IV SOLN
500.0000 [IU] | Freq: Once | INTRAVENOUS | Status: DC | PRN
  Filled 2016-09-01: qty 5

## 2016-09-01 MED ORDER — TRASTUZUMAB CHEMO 150 MG IV SOLR
6.0000 mg/kg | Freq: Once | INTRAVENOUS | Status: AC
  Administered 2016-09-01: 336 mg via INTRAVENOUS
  Filled 2016-09-01: qty 16

## 2016-09-01 MED ORDER — SODIUM CHLORIDE 0.9% FLUSH
10.0000 mL | INTRAVENOUS | Status: DC | PRN
  Filled 2016-09-01: qty 10

## 2016-09-01 MED ORDER — ACETAMINOPHEN 325 MG PO TABS
ORAL_TABLET | ORAL | Status: AC
  Filled 2016-09-01: qty 2

## 2016-09-01 MED ORDER — DIPHENHYDRAMINE HCL 25 MG PO CAPS
ORAL_CAPSULE | ORAL | Status: AC
  Filled 2016-09-01: qty 2

## 2016-09-01 NOTE — Patient Instructions (Signed)
[facility] Discharge Instructions for Patients Receiving Chemotherapy  Today you received the following chemotherapy agents:  Herceptin  To help prevent nausea and vomiting after your treatment, we encourage you to take your nausea medication as prescribed.   If you develop nausea and vomiting that is not controlled by your nausea medication, call the clinic.   BELOW ARE SYMPTOMS THAT SHOULD BE REPORTED IMMEDIATELY:  *FEVER GREATER THAN 100.5 F  *CHILLS WITH OR WITHOUT FEVER  NAUSEA AND VOMITING THAT IS NOT CONTROLLED WITH YOUR NAUSEA MEDICATION  *UNUSUAL SHORTNESS OF BREATH  *UNUSUAL BRUISING OR BLEEDING  TENDERNESS IN MOUTH AND THROAT WITH OR WITHOUT PRESENCE OF ULCERS  *URINARY PROBLEMS  *BOWEL PROBLEMS  UNUSUAL RASH Items with * indicate a potential emergency and should be followed up as soon as possible.  Feel free to call the clinic you have any questions or concerns. The clinic phone number is [phone].  Please show the CHEMO ALERT CARD at check-in to the Emergency Department and triage nurse.   

## 2016-09-01 NOTE — Assessment & Plan Note (Signed)
Left lumpectomy 06/17/2016: IDC grade 2, 2.4 cm, with intermediate grade DCIS, LVID present, margins negative, 0/4 lymph nodes positive, ER 60%, PR 0%, HER-2 negative ratio 1.42, Ki-67 15%, T2 N0 stage II a Adjuvant radiation therapy started 07/17/2016 completed 08/14/2016  Treatment plan: Adjuvant Herceptin every 3 weeks started 07/21/2016 for 1 year with anastrozole started 09/01/2016  Patient's cardiologist Dr. Carlyle Dolly to do her echocardiograms every 3 months for 1 year. -------------------------------------------------------------------------------------------------------------------------------- Current treatment: Herceptin every 3 weeks + anastrozole 1 mg daily started 09/01/2016 Monitoring closely for toxicities.  Anastrozole toxicities:  Return to clinic every 6 weeks for follow-up and every 3 weeks with Herceptin.

## 2016-09-01 NOTE — Progress Notes (Signed)
Patient Care Team: Rosita Fire, MD as PCP - General (Internal Medicine)  SUMMARY OF ONCOLOGIC HISTORY:   Malignant neoplasm of upper-inner quadrant of left female breast ([place])   06/02/2016 Initial Diagnosis    Left breast biopsy 11:00 position: IDC with DCIS, ER 60%, PR 0%, Ki-67 15%, HER-2 negative ratio 1.4 to      06/17/2016 Surgery    Left lumpectomy: IDC grade 2, 2.4 cm, with intermediate grade DCIS, LVID present, margins negative, 0/4 lymph nodes positive, ER 60%, PR 0%, HER-2 negative ratio 1.42, Ki-67 15%, T2 N0 stage II a      07/17/2016 - 08/14/2016 Radiation Therapy    Adjuvant radiation therapy      07/21/2016 -  Chemotherapy    Herceptin every 3 weeks 1 year along with anastrozole to start after radiation is complete       CHIEF COMPLIANT: Currently on maintenance Herceptin, completed radiation therapy  INTERVAL HISTORY: Charlotte [name] is a 82 year old with above-mentioned history left breast cancer underwent lumpectomy followed by adjuvant radiation and is currently on Herceptin therapy. She is tolerating Herceptin fairly well. Since she completed radiation, she will also begin treatment with anastrozole currently. She denies any major problems or difficulties from the effects of radiation. She did have mild radiation dermatitis.  REVIEW OF SYSTEMS:   Constitutional: Denies fevers, chills or abnormal weight loss Eyes: Denies blurriness of vision Ears, nose, mouth, throat, and face: Denies mucositis or sore throat Respiratory: Denies cough, dyspnea or wheezes Cardiovascular: Denies palpitation, chest discomfort Gastrointestinal:  Denies nausea, heartburn or change in bowel habits Skin: Denies abnormal skin rashes Lymphatics: Denies new lymphadenopathy or easy bruising Neurological:Denies numbness, tingling or new weaknesses Behavioral/Psych: Mood is stable, no new changes  Extremities: No lower extremity edema Breast:  Mild radiation dermatitis All other systems  were reviewed with the patient and are negative.  I have reviewed the past medical history, past surgical history, social history and family history with the patient and they are unchanged from previous note.  ALLERGIES:  is allergic to latex; bextra [valdecoxib]; and motrin [ibuprofen].  MEDICATIONS:  Current Outpatient Prescriptions  Medication Sig Dispense Refill  . acetaminophen (TYLENOL) 500 MG tablet Take 1,000 mg by mouth daily as needed for mild pain. Reported on 01/27/2016    . aspirin EC 81 MG tablet Take 81 mg by mouth daily.    . Calcium Carbonate-Vitamin D (CALCIUM 600+D3 PO) Take 1 tablet by mouth 2 (two) times daily.    . carbamide peroxide (DEBROX) 6.5 % otic solution Place 1 drop into both ears daily as needed (ear wax buildup).    . cycloSPORINE (RESTASIS) 0.05 % ophthalmic emulsion Place 1 drop into both eyes 2 (two) times daily.    . diphenhydramine-acetaminophen (TYLENOL PM) 25-500 MG TABS tablet Take 1 tablet by mouth at bedtime as needed (sleep).    . felodipine (PLENDIL) 5 MG 24 hr tablet Take 5 mg by mouth daily.     . fluticasone (FLONASE) 50 MCG/ACT nasal spray Place 2 sprays into the nose daily as needed for allergies.    . hyaluronate sodium (RADIAPLEXRX) GEL Apply 1 application topically 2 (two) times daily.    . hydrALAZINE (APRESOLINE) 50 MG tablet Take 1 tablet (50 mg total) by mouth 2 (two) times daily. 180 tablet 3  . lidocaine-prilocaine (EMLA) cream Apply 1 application topically as needed. 30 g 1  . Liniments (SALONPAS PAIN RELIEF PATCH) PADS Apply 1 each topically daily as needed (pain).    Marland Kitchen  LORazepam (ATIVAN) 1 MG tablet Take 0.5 mg by mouth every 8 (eight) hours as needed for anxiety or sleep.     Marland Kitchen losartan (COZAAR) 100 MG tablet Take 100 mg by mouth daily.    . Melatonin 5 MG TABS Take 5 mg by mouth at bedtime as needed (sleep).     . Multiple Vitamins-Minerals (CENTRUM ADULTS PO) Take 1 tablet by mouth daily.    . non-metallic deodorant Jethro Poling) MISC  Apply 1 application topically daily as needed.    . Omega-3 Fatty Acids (FISH OIL TRIPLE STRENGTH) 1400 MG CAPS Take 1 tablet by mouth daily.    Vladimir Faster Glycol-Propyl Glycol (SYSTANE) 0.4-0.3 % SOLN Apply 1 drop to eye 2 (two) times daily.    Marland Kitchen tretinoin (RETIN-A) 0.025 % cream Apply 1 application topically at bedtime.   3   No current facility-administered medications for this visit.     PHYSICAL EXAMINATION: ECOG PERFORMANCE STATUS: 1 - Symptomatic but completely ambulatory  There were no vitals filed for this visit. There were no vitals filed for this visit.  GENERAL:alert, no distress and comfortable SKIN: skin color, texture, turgor are normal, no rashes or significant lesions EYES: normal, Conjunctiva are pink and non-injected, sclera clear OROPHARYNX:no exudate, no erythema and lips, buccal mucosa, and tongue normal  NECK: supple, thyroid normal size, non-tender, without nodularity LYMPH:  no palpable lymphadenopathy in the cervical, axillary or inguinal LUNGS: clear to auscultation and percussion with normal bre 11 athing effort HEART: regular rate & rhythm and no murmurs and no lower extremity edema ABDOMEN:abdomen soft, non-tender and normal bowel sounds MUSCULOSKELETAL:no cyanosis of digits and no clubbing  NEURO: alert & oriented x 3 with fluent speech, no focal motor/sensory deficits EXTREMITIES: No lower extremity edema  LABORATORY DATA:  I have reviewed the data as listed   Chemistry      Component Value Date/Time   NA 137 08/11/2016 1017   K 3.8 08/11/2016 1017   CL 98 (L) 07/16/2016 0725   CO2 30 (H) 08/11/2016 1017   BUN 12.8 08/11/2016 1017   CREATININE 1.0 08/11/2016 1017      Component Value Date/Time   CALCIUM 10.1 08/11/2016 1017   ALKPHOS 68 08/11/2016 1017   AST 20 08/11/2016 1017   ALT 20 08/11/2016 1017   BILITOT 0.39 08/11/2016 1017       Lab Results  Component Value Date   WBC 4.6 09/01/2016   HGB 12.2 09/01/2016   HCT 37.3  09/01/2016   MCV 88.1 09/01/2016   PLT 347 09/01/2016   NEUTROABS 3.4 09/01/2016     ASSESSMENT & PLAN:  Malignant neoplasm of upper-inner quadrant of left female breast ([facility]) Left lumpectomy 06/17/2016: IDC grade 2, 2.4 cm, with intermediate grade DCIS, LVID present, margins negative, 0/4 lymph nodes positive, ER 60%, PR 0%, HER-2 negative ratio 1.42, Ki-67 15%, T2 N0 stage II a Adjuvant radiation therapy started 07/17/2016 completed 08/14/2016  Treatment plan: Adjuvant Herceptin every 3 weeks started 07/21/2016 for 1 year with anastrozole started 09/01/2016  Patient's cardiologist Dr. Carlyle Dolly to do her echocardiograms every 3 months for 1 year. -------------------------------------------------------------------------------------------------------------------------------- Current treatment: Herceptin every 3 weeks + anastrozole 1 mg daily started 09/01/2016 Monitoring closely for toxicities.  Anastrozole counseling:We discussed the risks and benefits of anti-estrogen therapy with aromatase inhibitors. These include but not limited to insomnia, hot flashes, mood changes, vaginal dryness, bone density loss, and weight gain. We strongly believe that the benefits far outweigh the risks. Patient understands these risks  and consented to starting treatment. Planned treatment duration is 5 years.  Return to clinic every 6 weeks for follow-up and every 3 weeks with Herceptin.  No orders of the defined types were placed in this encounter.  The patient has a good understanding of the overall plan. she agrees with it. she will call with any problems that may develop before the next visit here.   Rulon Eisenmenger, MD 09/01/16

## 2016-09-11 ENCOUNTER — Ambulatory Visit (INDEPENDENT_AMBULATORY_CARE_PROVIDER_SITE_OTHER): Payer: Medicare Other | Admitting: Cardiology

## 2016-09-11 ENCOUNTER — Encounter: Payer: Self-pay | Admitting: Cardiology

## 2016-09-11 VITALS — BP 174/74 | HR 78 | Ht 63.0 in | Wt 131.0 lb

## 2016-09-11 DIAGNOSIS — I10 Essential (primary) hypertension: Secondary | ICD-10-CM | POA: Diagnosis not present

## 2016-09-11 DIAGNOSIS — R5382 Chronic fatigue, unspecified: Secondary | ICD-10-CM | POA: Diagnosis not present

## 2016-09-11 NOTE — Patient Instructions (Signed)
Your physician wants you to follow-up in: 1 Year with Dr. Branch. You will receive a reminder letter in the mail two months in advance. If you don't receive a letter, please call our office to schedule the follow-up appointment.   Your physician recommends that you continue on your current medications as directed. Please refer to the Current Medication list given to you today.  If you need a refill on your cardiac medications before your next appointment, please call your pharmacy.  Thank you for choosing [facility]!   

## 2016-09-11 NOTE — Progress Notes (Signed)
Clinical Summary Charlotte [name] is a 82 y.o.female seen today for follow up of the following medical problems.   1. Generalized fatigue - long history of generalized fatigue. No evidence of cardiac etiology.  - 07/2015 echo LVEF 55-60%, no WMAs, grade I diastolic dysfunction, mild MR, PASP 49. Normal RV size and function.  - 08/2015 CXR no acute process. EKG SR with LVH.  - 08/2015 normal TSH, Hgb, trop neg x1.    - no recent SOB. Does contninue to have fatigue, poor sleep at night. Often fatigue after radiation treatments for her breast cancer. .    2. HTN - difficult to manage due to reported medication side effects, though it appears that many of the changes may have been related to her chronic fatigue that did not improve with med change.  - Clonidine caused fatigue and was stopped. Felodopine 10mg  caused weakness, changed to 5mg  daily which she is tolerating . Maxide stopped due to fatigue as well as drop in Na and K  - bp log 140s-150s/60-70s.   3. Anxiety - reports recent severe anxiety, panic attacks. Has appt with behavioral health  4. Breast cancer - followed by oncology. Treated with herceptin and radiation, prior lumpectomy.  Past Medical History:  Diagnosis Date  . Anxiety   . Arthritis   . Cancer [facility])    breast cancer  . Fatigue   . History of hyperthyroidism    resolved, no medicaions for treatment at this time  . Hypertension   . Insomnia      Allergies  Allergen Reactions  . Latex Swelling    SEVERITY OF SWELLING NOT DEFINED.  Marland Kitchen Bextra [Valdecoxib] Other (See Comments)    Stomach Pains  . Motrin [Ibuprofen] Other (See Comments)    Stomach pain     Current Outpatient Prescriptions  Medication Sig Dispense Refill  . acetaminophen (TYLENOL) 500 MG tablet Take 1,000 mg by mouth daily as needed for mild pain. Reported on 01/27/2016    . aspirin EC 81 MG tablet Take 81 mg by mouth daily.    . Calcium Carbonate-Vitamin D (CALCIUM 600+D3 PO) Take 1  tablet by mouth 2 (two) times daily.    . carbamide peroxide (DEBROX) 6.5 % otic solution Place 1 drop into both ears daily as needed (ear wax buildup).    . cycloSPORINE (RESTASIS) 0.05 % ophthalmic emulsion Place 1 drop into both eyes 2 (two) times daily.    . diphenhydramine-acetaminophen (TYLENOL PM) 25-500 MG TABS tablet Take 1 tablet by mouth at bedtime as needed (sleep).    . felodipine (PLENDIL) 5 MG 24 hr tablet Take 5 mg by mouth daily.     . fluticasone (FLONASE) 50 MCG/ACT nasal spray Place 2 sprays into the nose daily as needed for allergies.    . hyaluronate sodium (RADIAPLEXRX) GEL Apply 1 application topically 2 (two) times daily.    . hydrALAZINE (APRESOLINE) 50 MG tablet Take 1 tablet (50 mg total) by mouth 2 (two) times daily. 180 tablet 3  . lidocaine-prilocaine (EMLA) cream Apply 1 application topically as needed. 30 g 1  . Liniments (SALONPAS PAIN RELIEF PATCH) PADS Apply 1 each topically daily as needed (pain).    . LORazepam (ATIVAN) 1 MG tablet Take 0.5 mg by mouth every 8 (eight) hours as needed for anxiety or sleep.     Marland Kitchen losartan (COZAAR) 100 MG tablet Take 100 mg by mouth daily.    . Melatonin 5 MG TABS Take 5 mg  by mouth at bedtime as needed (sleep).     . Multiple Vitamins-Minerals (CENTRUM ADULTS PO) Take 1 tablet by mouth daily.    . non-metallic deodorant Jethro Poling) MISC Apply 1 application topically daily as needed.    . Omega-3 Fatty Acids (FISH OIL TRIPLE STRENGTH) 1400 MG CAPS Take 1 tablet by mouth daily.    Vladimir Faster Glycol-Propyl Glycol (SYSTANE) 0.4-0.3 % SOLN Apply 1 drop to eye 2 (two) times daily.    Marland Kitchen tretinoin (RETIN-A) 0.025 % cream Apply 1 application topically at bedtime.   3   No current facility-administered medications for this visit.      Past Surgical History:  Procedure Laterality Date  . BREAST LUMPECTOMY     right side  . BREAST LUMPECTOMY WITH AXILLARY LYMPH NODE BIOPSY Left 06/17/2016   Procedure: BREAST LUMPECTOMY WITH AXILLARY  LYMPH NODE BIOPSY;  Surgeon: Autumn Messing III, MD;  Location: [place];  Service: General;  Laterality: Left;  . COLONOSCOPY    . DILATION AND CURETTAGE OF UTERUS    . EYE SURGERY     bilateral cataracts removed  . FOOT SURGERY    . PORTACATH PLACEMENT Right 07/16/2016   Procedure: INSERTION PORT-A-CATH RIGHT SUBLCLAVIAN;  Surgeon: Autumn Messing III, MD;  Location: [place];  Service: General;  Laterality: Right;  . TONSILLECTOMY       Allergies  Allergen Reactions  . Latex Swelling    SEVERITY OF SWELLING NOT DEFINED.  Marland Kitchen Bextra [Valdecoxib] Other (See Comments)    Stomach Pains  . Motrin [Ibuprofen] Other (See Comments)    Stomach pain      Family History  Problem Relation Age of Onset  . Heart attack Mother   . Cancer Mother      Social History Charlotte [name] reports that she has never smoked. She has never used smokeless tobacco. Charlotte [name] reports that she does not drink alcohol.   Review of Systems CONSTITUTIONAL: +fatigue HEENT: Eyes: No visual loss, blurred vision, double vision or yellow sclerae.No hearing loss, sneezing, congestion, runny nose or sore throat.  SKIN: No rash or itching.  CARDIOVASCULAR: per hpi RESPIRATORY: No shortness of breath, cough or sputum.  GASTROINTESTINAL: No anorexia, nausea, vomiting or diarrhea. No abdominal pain or blood.  GENITOURINARY: No burning on urination, no polyuria NEUROLOGICAL: No headache, dizziness, syncope, paralysis, ataxia, numbness or tingling in the extremities. No change in bowel or bladder control.  MUSCULOSKELETAL: No muscle, back pain, joint pain or stiffness.  LYMPHATICS: No enlarged nodes. No history of splenectomy.  PSYCHIATRIC: No history of depression or anxiety.  ENDOCRINOLOGIC: No reports of sweating, cold or heat intolerance. No polyuria or polydipsia.  Marland Kitchen   Physical Examination Vitals:   09/11/16 1438  BP: (!) 174/74  Pulse: 78   Vitals:   09/11/16 1438  Weight: 131 lb (59.4 kg)  Height: 5\' 3"  (1.6 m)     Gen: resting comfortably, no acute distress HEENT: no scleral icterus, pupils equal round and reactive, no palptable cervical adenopathy,  CV: RRR, no m/r/g, no jvd Resp: Clear to auscultation bilaterally GI: abdomen is soft, non-tender, non-distended, normal bowel sounds, no hepatosplenomegaly MSK: extremities are warm, no edema.  Skin: warm, no rash Neuro:  no focal deficits Psych: appropriate affect   Diagnostic Studies 07/2015 echo Study Conclusions  - Left ventricle: The cavity size was normal. Wall thickness was normal. Systolic function was normal. The estimated ejection fraction was in the range of 55% to 60%. Wall motion was normal; there were  no regional wall motion abnormalities. Doppler parameters are consistent with abnormal left ventricular relaxation (grade 1 diastolic dysfunction). There is evidence of elevated LA pressure. - Aortic valve: Mildly calcified annulus. Trileaflet; mildly thickened leaflets. Valve area (VTI): 1.78 cm^2. Valve area (Vmax): 1.91 cm^2. - Mitral valve: Mildly calcified annulus. Normal thickness leaflets . There was mild regurgitation. - Left atrium: The atrium was moderately dilated. - Atrial septum: No defect or patent foramen ovale was identified. - Pulmonary arteries: Systolic pressure was moderately increased. PA peak pressure: 49 mm Hg (S). - Technically adequate study.  10/2015 CT PE IMPRESSION: 1. No acute findings identified. No evidence for acute pulmonary embolus. 2. Aortic atherosclerosis and LAD coronary artery calcification.   06/2016 echo Study Conclusions  - Left ventricle: The cavity size was normal. Wall thickness was   normal. Systolic function was normal. The estimated ejection   fraction was in the range of 60% to 65%. Wall motion was normal;   there were no regional wall motion abnormalities. Doppler   parameters are consistent with abnormal left ventricular   relaxation (grade 1  diastolic dysfunction). Doppler parameters   are consistent with high ventricular filling pressure. - Aortic valve: Valve area (VTI): 2.41 cm^2. Valve area (Vmax):   2.51 cm^2. Valve area (Vmean): 2.45 cm^2. - Mitral valve: There was mild regurgitation. - Left atrium: The atrium was moderately dilated. - Right atrium: The atrium was moderately dilated. - Pulmonary arteries: Systolic pressure was moderately increased.   PA peak pressure: 51 mm Hg (S). - Technically adequate study.    Assessment and Plan  1. Generalized fatigue - negative cardiac workup, does not appear to be cardiac in etiology - no further workup at this  time  2. HTN - reasonable control given her age, we will continue current meds   F/u 1 year      Arnoldo Lenis, M.D., F.A.C.C.

## 2016-09-15 ENCOUNTER — Ambulatory Visit: Payer: Self-pay | Admitting: Radiation Oncology

## 2016-09-22 ENCOUNTER — Ambulatory Visit
Admission: RE | Admit: 2016-09-22 | Discharge: 2016-09-22 | Disposition: A | Discharge status: Home / Self Care | Payer: Medicare Other | Source: Ambulatory Visit | Attending: Radiation Oncology | Admitting: Radiation Oncology

## 2016-09-22 ENCOUNTER — Other Ambulatory Visit (HOSPITAL_BASED_OUTPATIENT_CLINIC_OR_DEPARTMENT_OTHER): Payer: Medicare Other

## 2016-09-22 ENCOUNTER — Encounter: Payer: Self-pay | Admitting: Radiation Oncology

## 2016-09-22 ENCOUNTER — Ambulatory Visit (HOSPITAL_BASED_OUTPATIENT_CLINIC_OR_DEPARTMENT_OTHER): Payer: Medicare Other

## 2016-09-22 VITALS — BP 159/61 | HR 67 | Temp 98.1°F | Ht 63.0 in | Wt 129.2 lb

## 2016-09-22 VITALS — BP 172/64 | HR 66 | Temp 97.8°F | Resp 17

## 2016-09-22 DIAGNOSIS — C50212 Malignant neoplasm of upper-inner quadrant of left female breast: Secondary | ICD-10-CM

## 2016-09-22 DIAGNOSIS — Z170 Estrogen receptor positive status [ER+]: Secondary | ICD-10-CM | POA: Insufficient documentation

## 2016-09-22 DIAGNOSIS — C50912 Malignant neoplasm of unspecified site of left female breast: Secondary | ICD-10-CM | POA: Insufficient documentation

## 2016-09-22 DIAGNOSIS — Z7982 Long term (current) use of aspirin: Secondary | ICD-10-CM | POA: Insufficient documentation

## 2016-09-22 DIAGNOSIS — Z5112 Encounter for antineoplastic immunotherapy: Secondary | ICD-10-CM | POA: Diagnosis present

## 2016-09-22 LAB — CBC WITH DIFFERENTIAL/PLATELET
BASO%: 0.7 % (ref 0.0–2.0)
BASOS ABS: 0 10*3/uL (ref 0.0–0.1)
EOS%: 0.5 % (ref 0.0–7.0)
Eosinophils Absolute: 0 10*3/uL (ref 0.0–0.5)
HCT: 37.4 % (ref 34.8–46.6)
HEMOGLOBIN: 12.2 g/dL (ref 11.6–15.9)
LYMPH%: 20.9 % (ref 14.0–49.7)
MCH: 28.2 pg (ref 25.1–34.0)
MCHC: 32.6 g/dL (ref 31.5–36.0)
MCV: 86.6 fL (ref 79.5–101.0)
MONO#: 0.5 10*3/uL (ref 0.1–0.9)
MONO%: 9.3 % (ref 0.0–14.0)
NEUT#: 3.5 10*3/uL (ref 1.5–6.5)
NEUT%: 68.6 % (ref 38.4–76.8)
Platelets: 358 10*3/uL (ref 145–400)
RBC: 4.31 10*6/uL (ref 3.70–5.45)
RDW: 14.9 % — AB (ref 11.2–14.5)
WBC: 5.1 10*3/uL (ref 3.9–10.3)
lymph#: 1.1 10*3/uL (ref 0.9–3.3)

## 2016-09-22 LAB — COMPREHENSIVE METABOLIC PANEL
ALBUMIN: 3.6 g/dL (ref 3.5–5.0)
ALK PHOS: 80 U/L (ref 40–150)
ALT: 21 U/L (ref 0–55)
AST: 18 U/L (ref 5–34)
Anion Gap: 11 mEq/L (ref 3–11)
BILIRUBIN TOTAL: 0.43 mg/dL (ref 0.20–1.20)
BUN: 17.1 mg/dL (ref 7.0–26.0)
CHLORIDE: 96 meq/L — AB (ref 98–109)
CO2: 28 mEq/L (ref 22–29)
Calcium: 10.3 mg/dL (ref 8.4–10.4)
Creatinine: 1.1 mg/dL (ref 0.6–1.1)
EGFR: 56 mL/min/{1.73_m2} — ABNORMAL LOW (ref >90)
GLUCOSE: 104 mg/dL (ref 70–140)
POTASSIUM: 3.9 meq/L (ref 3.5–5.1)
SODIUM: 135 meq/L — AB (ref 136–145)
Total Protein: 7.9 g/dL (ref 6.4–8.3)

## 2016-09-22 MED ORDER — SODIUM CHLORIDE 0.9% FLUSH
10.0000 mL | INTRAVENOUS | Status: DC | PRN
  Administered 2016-09-22: 10 mL
  Filled 2016-09-22: qty 10

## 2016-09-22 MED ORDER — DIPHENHYDRAMINE HCL 25 MG PO CAPS
50.0000 mg | ORAL_CAPSULE | Freq: Once | ORAL | Status: AC
  Administered 2016-09-22: 50 mg via ORAL

## 2016-09-22 MED ORDER — ACETAMINOPHEN 325 MG PO TABS
650.0000 mg | ORAL_TABLET | Freq: Once | ORAL | Status: AC
  Administered 2016-09-22: 650 mg via ORAL

## 2016-09-22 MED ORDER — DIPHENHYDRAMINE HCL 25 MG PO CAPS
ORAL_CAPSULE | ORAL | Status: AC
  Filled 2016-09-22: qty 2

## 2016-09-22 MED ORDER — TRASTUZUMAB CHEMO 150 MG IV SOLR
6.0000 mg/kg | Freq: Once | INTRAVENOUS | Status: AC
  Administered 2016-09-22: 336 mg via INTRAVENOUS
  Filled 2016-09-22: qty 16

## 2016-09-22 MED ORDER — SODIUM CHLORIDE 0.9 % IV SOLN
Freq: Once | INTRAVENOUS | Status: AC
  Administered 2016-09-22: 13:00:00 via INTRAVENOUS

## 2016-09-22 MED ORDER — ACETAMINOPHEN 325 MG PO TABS
ORAL_TABLET | ORAL | Status: AC
  Filled 2016-09-22: qty 2

## 2016-09-22 MED ORDER — HEPARIN SOD (PORK) LOCK FLUSH 100 UNIT/ML IV SOLN
500.0000 [IU] | Freq: Once | INTRAVENOUS | Status: AC | PRN
  Administered 2016-09-22: 500 [IU]
  Filled 2016-09-22: qty 5

## 2016-09-22 NOTE — Progress Notes (Signed)
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  Interval Since Last Radiation:  6 weeks   07/20/2016-08/14/2016: 1. Left breast boost// 7.5 Gy at 3 fractions for a dose of 2.5 Gy/ fraction 2. Left breast// 42.5 Gy at 17 fractions for a dose of 2.5 Gy/ fractionis not readily tomorrow  Narrative:  The patient returns today for routine follow-up.  The patient did very well and tolerating radiotherapy, she did not have any difficulty with desquamation. She is currently receiving Herceptin and is going to receive an infusion today.                              On review of systems, the patient states she is doing very well. She denies difficulty with skin breakdown or concerns with her skin since her radiotherapy. She does have some trouble with ins over-the-counter Tylenol PM to sleep and seems to be doing well with this. No other complaints are verbalized.  ALLERGIES:  is allergic to latex; bextra [valdecoxib]; and motrin [ibuprofen].  Meds: Current Outpatient Prescriptions  Medication Sig Dispense Refill  . aspirin EC 81 MG tablet Take 81 mg by mouth daily.    . Calcium Carbonate-Vitamin D (CALCIUM 600+D3 PO) Take 1 tablet by mouth 2 (two) times daily.    . carbamide peroxide (DEBROX) 6.5 % otic solution Place 1 drop into both ears daily as needed (ear wax buildup).    . cycloSPORINE (RESTASIS) 0.05 % ophthalmic emulsion Place 1 drop into both eyes 2 (two) times daily.    . diphenhydramine-acetaminophen (TYLENOL PM) 25-500 MG TABS tablet Take 1 tablet by mouth at bedtime as needed (sleep).    . felodipine (PLENDIL) 5 MG 24 hr tablet Take 5 mg by mouth daily.     . fluticasone (FLONASE) 50 MCG/ACT nasal spray Place 2 sprays into the  nose daily as needed for allergies.    . hydrALAZINE (APRESOLINE) 50 MG tablet Take 1 tablet (50 mg total) by mouth 2 (two) times daily. 180 tablet 3  . Liniments (SALONPAS PAIN RELIEF PATCH) PADS Apply 1 each topically daily as needed (pain).    . LORazepam (ATIVAN) 1 MG tablet Take 0.5 mg by mouth every 8 (eight) hours as needed for anxiety or sleep.     Marland Kitchen losartan (COZAAR) 100 MG tablet Take 100 mg by mouth daily.    . Melatonin 5 MG TABS Take 5 mg by mouth at bedtime as needed (sleep).     . Multiple Vitamins-Minerals (CENTRUM ADULTS PO) Take 1 tablet by mouth daily.    . non-metallic deodorant Jethro Poling) MISC Apply 1 application topically daily as needed.    . Omega-3 Fatty Acids (FISH OIL TRIPLE STRENGTH) 1400 MG CAPS Take 1 tablet by mouth daily.    Vladimir Faster Glycol-Propyl Glycol (SYSTANE) 0.4-0.3 % SOLN Apply 1 drop to eye 2 (two) times daily.    Marland Kitchen tretinoin (RETIN-A) 0.025 % cream Apply 1 application topically at bedtime.   3   No current facility-administered medications for this encounter.     Physical Findings:  height is 5\' 3"  (1.6 m) and weight is 129 lb 3.2 oz (58.6 kg). Her oral temperature is  98.1 F (36.7 C). Her blood pressure is 159/61 (abnormal) and her pulse is 67.  In general this is a well appearing African American female in no acute distress. She's alert and oriented x4 and appropriate throughout the examination. Cardiopulmonary assessment is negative for acute distress and she exhibits normal effort. The left breast is intact without desquamation.   Lab Findings: Lab Results  Component Value Date   WBC 5.1 09/22/2016   HGB 12.2 09/22/2016   HCT 37.4 09/22/2016   MCV 86.6 09/22/2016   PLT 358 09/22/2016     Radiographic Findings: No results found.  Impression/Plan: 1. Stage IIA, T2N0, ER positive invasive ductal carcinoma of the left breast. The patient has done well since completion of her radiation treatment. We reviewed the rationale for close  surveillance with Dr. Lindi Adie and survivorship clinic. She will continue her estrogen blockade was well with anastrazole and complete herceptin in August 2018. 2. Survivorship. She will meet with Mike Craze, NP following completion of herceptin. She states agreement and understanding.    Carola Rhine, PAC

## 2016-09-22 NOTE — Addendum Note (Signed)
Encounter addended by: Cory Kitt M Anjenette Gerbino, RN on: 09/22/2016  2:17 PM<BR>    Actions taken: Charge Capture section accepted

## 2016-09-22 NOTE — Patient Instructions (Signed)
[facility] Discharge Instructions for Patients Receiving Chemotherapy  Today you received the following chemotherapy agents:  Herceptin  To help prevent nausea and vomiting after your treatment, we encourage you to take your nausea medication as prescribed.   If you develop nausea and vomiting that is not controlled by your nausea medication, call the clinic.   BELOW ARE SYMPTOMS THAT SHOULD BE REPORTED IMMEDIATELY:  *FEVER GREATER THAN 100.5 F  *CHILLS WITH OR WITHOUT FEVER  NAUSEA AND VOMITING THAT IS NOT CONTROLLED WITH YOUR NAUSEA MEDICATION  *UNUSUAL SHORTNESS OF BREATH  *UNUSUAL BRUISING OR BLEEDING  TENDERNESS IN MOUTH AND THROAT WITH OR WITHOUT PRESENCE OF ULCERS  *URINARY PROBLEMS  *BOWEL PROBLEMS  UNUSUAL RASH Items with * indicate a potential emergency and should be followed up as soon as possible.  Feel free to call the clinic you have any questions or concerns. The clinic phone number is [phone].  Please show the CHEMO ALERT CARD at check-in to the Emergency Department and triage nurse.   

## 2016-09-22 NOTE — Progress Notes (Signed)
Charlotte [name] here for reassessment S/P XRT to her left breast.  Denies any pain at this time.  Daughter reports that she stays in the bed most of the day, but admits to having insomnia and is taking Tyenol PM -sleeps for only a few hours.  Note mild hyperpigmentation of left breast with soft intact skin.      BP (!) 159/61   Pulse 67   Temp 98.1 F (36.7 C) (Oral)   Ht 5\' 3"  (1.6 m)   Wt 129 lb 3.2 oz (58.6 kg)   BMI 22.89 kg/m     Wt Readings from Last 3 Encounters:  09/22/16 129 lb 3.2 oz (58.6 kg)  09/11/16 131 lb (59.4 kg)  09/01/16 128 lb 11.2 oz (58.4 kg)      .

## 2016-10-06 DIAGNOSIS — C50912 Malignant neoplasm of unspecified site of left female breast: Secondary | ICD-10-CM | POA: Diagnosis not present

## 2016-10-06 DIAGNOSIS — G4700 Insomnia, unspecified: Secondary | ICD-10-CM | POA: Diagnosis not present

## 2016-10-06 DIAGNOSIS — I10 Essential (primary) hypertension: Secondary | ICD-10-CM | POA: Diagnosis not present

## 2016-10-06 DIAGNOSIS — F330 Major depressive disorder, recurrent, mild: Secondary | ICD-10-CM | POA: Diagnosis not present

## 2016-10-13 ENCOUNTER — Ambulatory Visit (HOSPITAL_BASED_OUTPATIENT_CLINIC_OR_DEPARTMENT_OTHER): Payer: Medicare Other

## 2016-10-13 ENCOUNTER — Encounter: Payer: Self-pay | Admitting: Hematology and Oncology

## 2016-10-13 ENCOUNTER — Other Ambulatory Visit (HOSPITAL_BASED_OUTPATIENT_CLINIC_OR_DEPARTMENT_OTHER): Payer: Medicare Other

## 2016-10-13 ENCOUNTER — Ambulatory Visit (HOSPITAL_BASED_OUTPATIENT_CLINIC_OR_DEPARTMENT_OTHER): Payer: Medicare Other | Admitting: Hematology and Oncology

## 2016-10-13 DIAGNOSIS — Z170 Estrogen receptor positive status [ER+]: Secondary | ICD-10-CM

## 2016-10-13 DIAGNOSIS — C50212 Malignant neoplasm of upper-inner quadrant of left female breast: Secondary | ICD-10-CM | POA: Diagnosis not present

## 2016-10-13 DIAGNOSIS — Z5112 Encounter for antineoplastic immunotherapy: Secondary | ICD-10-CM | POA: Diagnosis not present

## 2016-10-13 DIAGNOSIS — C50412 Malignant neoplasm of upper-outer quadrant of left female breast: Secondary | ICD-10-CM

## 2016-10-13 LAB — CBC WITH DIFFERENTIAL/PLATELET
BASO%: 0.6 % (ref 0.0–2.0)
BASOS ABS: 0 10*3/uL (ref 0.0–0.1)
EOS%: 0.4 % (ref 0.0–7.0)
Eosinophils Absolute: 0 10*3/uL (ref 0.0–0.5)
HEMATOCRIT: 37.5 % (ref 34.8–46.6)
HEMOGLOBIN: 12.1 g/dL (ref 11.6–15.9)
LYMPH#: 0.8 10*3/uL — AB (ref 0.9–3.3)
LYMPH%: 16 % (ref 14.0–49.7)
MCH: 27.8 pg (ref 25.1–34.0)
MCHC: 32.3 g/dL (ref 31.5–36.0)
MCV: 86.1 fL (ref 79.5–101.0)
MONO#: 0.4 10*3/uL (ref 0.1–0.9)
MONO%: 7 % (ref 0.0–14.0)
NEUT#: 3.9 10*3/uL (ref 1.5–6.5)
NEUT%: 76 % (ref 38.4–76.8)
PLATELETS: 334 10*3/uL (ref 145–400)
RBC: 4.36 10*6/uL (ref 3.70–5.45)
RDW: 14.9 % — ABNORMAL HIGH (ref 11.2–14.5)
WBC: 5.1 10*3/uL (ref 3.9–10.3)

## 2016-10-13 LAB — COMPREHENSIVE METABOLIC PANEL
ALBUMIN: 3.4 g/dL — AB (ref 3.5–5.0)
ALK PHOS: 79 U/L (ref 40–150)
ALT: 18 U/L (ref 0–55)
ANION GAP: 12 meq/L — AB (ref 3–11)
AST: 19 U/L (ref 5–34)
BUN: 15.6 mg/dL (ref 7.0–26.0)
CALCIUM: 9.9 mg/dL (ref 8.4–10.4)
CHLORIDE: 98 meq/L (ref 98–109)
CO2: 26 mEq/L (ref 22–29)
CREATININE: 1 mg/dL (ref 0.6–1.1)
EGFR: 57 mL/min/{1.73_m2} — ABNORMAL LOW (ref >90)
Glucose: 133 mg/dl (ref 70–140)
Potassium: 3.5 mEq/L (ref 3.5–5.1)
Sodium: 135 mEq/L — ABNORMAL LOW (ref 136–145)
Total Bilirubin: 0.32 mg/dL (ref 0.20–1.20)
Total Protein: 7.9 g/dL (ref 6.4–8.3)

## 2016-10-13 MED ORDER — ANASTROZOLE 1 MG PO TABS: 1.0000 mg | ORAL_TABLET | Freq: Every day | ORAL | 3 refills | Status: DC

## 2016-10-13 MED ORDER — HEPARIN SOD (PORK) LOCK FLUSH 100 UNIT/ML IV SOLN
500.0000 [IU] | Freq: Once | INTRAVENOUS | Status: AC | PRN
  Administered 2016-10-13: 500 [IU]
  Filled 2016-10-13: qty 5

## 2016-10-13 MED ORDER — DIPHENHYDRAMINE HCL 25 MG PO CAPS
50.0000 mg | ORAL_CAPSULE | Freq: Once | ORAL | Status: AC
  Administered 2016-10-13: 50 mg via ORAL

## 2016-10-13 MED ORDER — ACETAMINOPHEN 325 MG PO TABS
650.0000 mg | ORAL_TABLET | Freq: Once | ORAL | Status: AC
  Administered 2016-10-13: 650 mg via ORAL

## 2016-10-13 MED ORDER — SODIUM CHLORIDE 0.9% FLUSH
10.0000 mL | INTRAVENOUS | Status: DC | PRN
  Administered 2016-10-13: 10 mL
  Filled 2016-10-13: qty 10

## 2016-10-13 MED ORDER — TRASTUZUMAB CHEMO 150 MG IV SOLR
6.0000 mg/kg | Freq: Once | INTRAVENOUS | Status: AC
  Administered 2016-10-13: 336 mg via INTRAVENOUS
  Filled 2016-10-13: qty 16

## 2016-10-13 MED ORDER — ACETAMINOPHEN 325 MG PO TABS
ORAL_TABLET | ORAL | Status: AC
  Filled 2016-10-13: qty 2

## 2016-10-13 MED ORDER — DIPHENHYDRAMINE HCL 25 MG PO CAPS
ORAL_CAPSULE | ORAL | Status: AC
  Filled 2016-10-13: qty 2

## 2016-10-13 MED ORDER — SODIUM CHLORIDE 0.9 % IV SOLN
Freq: Once | INTRAVENOUS | Status: AC
  Administered 2016-10-13: 13:00:00 via INTRAVENOUS

## 2016-10-13 NOTE — Progress Notes (Signed)
Patient Care Team: Rosita Fire, MD as PCP - General (Internal Medicine)  DIAGNOSIS:  Encounter Diagnosis  Name Primary?  . Malignant neoplasm of upper-inner quadrant of left breast in female, estrogen receptor positive ([place])     SUMMARY OF ONCOLOGIC HISTORY:   Malignant neoplasm of upper-inner quadrant of left female breast ([place])   06/02/2016 Initial Diagnosis    Left breast biopsy 11:00 position: IDC with DCIS, ER 60%, PR 0%, Ki-67 15%, HER-2 negative ratio 1.4 to      06/17/2016 Surgery    Left lumpectomy: IDC grade 2, 2.4 cm, with intermediate grade DCIS, LVID present, margins negative, 0/4 lymph nodes positive, ER 60%, PR 0%, HER-2 negative ratio 1.42, Ki-67 15%, T2 N0 stage II a      07/17/2016 - 08/14/2016 Radiation Therapy    Adjuvant radiation therapy      07/21/2016 -  Chemotherapy    Herceptin every 3 weeks 1 year along with anastrozole to start after radiation is complete       CHIEF COMPLIANT: Follow-up on Herceptin  INTERVAL HISTORY: Charlotte [name] is a 82 year old with above-mentioned history left breast cancer treated with lumpectomy and radiation currently on Herceptin maintenance. She is tolerating Herceptin very well. She is going to start anastrozole today. She denies any major complaints other than fatigue and shortness of breath to minimal exertion. She appears to be sedentary and mostly inside her house.  REVIEW OF SYSTEMS:   Constitutional: Denies fevers, chills or abnormal weight loss Eyes: Denies blurriness of vision Ears, nose, mouth, throat, and face: Denies mucositis or sore throat Respiratory: Denies cough, dyspnea or wheezes Cardiovascular: Denies palpitation, chest discomfort Gastrointestinal:  Denies nausea, heartburn or change in bowel habits Skin: Denies abnormal skin rashes Lymphatics: Denies new lymphadenopathy or easy bruising Neurological:Denies numbness, tingling or new weaknesses Behavioral/Psych: Mood is stable, no new changes    Extremities: No lower extremity edema Breast:  denies any pain or lumps or nodules in either breasts All other systems were reviewed with the patient and are negative.  I have reviewed the past medical history, past surgical history, social history and family history with the patient and they are unchanged from previous note.  ALLERGIES:  is allergic to latex; bextra [valdecoxib]; and motrin [ibuprofen].  MEDICATIONS:  Current Outpatient Prescriptions  Medication Sig Dispense Refill  . aspirin EC 81 MG tablet Take 81 mg by mouth daily.    . Calcium Carbonate-Vitamin D (CALCIUM 600+D3 PO) Take 1 tablet by mouth 2 (two) times daily.    . carbamide peroxide (DEBROX) 6.5 % otic solution Place 1 drop into both ears daily as needed (ear wax buildup).    . cycloSPORINE (RESTASIS) 0.05 % ophthalmic emulsion Place 1 drop into both eyes 2 (two) times daily.    . diphenhydramine-acetaminophen (TYLENOL PM) 25-500 MG TABS tablet Take 1 tablet by mouth at bedtime as needed (sleep).    . felodipine (PLENDIL) 5 MG 24 hr tablet Take 5 mg by mouth daily.     . fluticasone (FLONASE) 50 MCG/ACT nasal spray Place 2 sprays into the nose daily as needed for allergies.    . hydrALAZINE (APRESOLINE) 50 MG tablet Take 1 tablet (50 mg total) by mouth 2 (two) times daily. 180 tablet 3  . Liniments (SALONPAS PAIN RELIEF PATCH) PADS Apply 1 each topically daily as needed (pain).    . LORazepam (ATIVAN) 1 MG tablet Take 0.5 mg by mouth every 8 (eight) hours as needed for anxiety or sleep.     Marland Kitchen  losartan (COZAAR) 100 MG tablet Take 100 mg by mouth daily.    . Melatonin 5 MG TABS Take 5 mg by mouth at bedtime as needed (sleep).     . Multiple Vitamins-Minerals (CENTRUM ADULTS PO) Take 1 tablet by mouth daily.    . non-metallic deodorant Jethro Poling) MISC Apply 1 application topically daily as needed.    . Omega-3 Fatty Acids (FISH OIL TRIPLE STRENGTH) 1400 MG CAPS Take 1 tablet by mouth daily.    Vladimir Faster Glycol-Propyl  Glycol (SYSTANE) 0.4-0.3 % SOLN Apply 1 drop to eye 2 (two) times daily.    Marland Kitchen tretinoin (RETIN-A) 0.025 % cream Apply 1 application topically at bedtime.   3   No current facility-administered medications for this visit.     PHYSICAL EXAMINATION: ECOG PERFORMANCE STATUS: 1 - Symptomatic but completely ambulatory  Vitals:   10/13/16 1132  BP: (!) 175/60  Pulse: 75  Resp: 17  Temp: 97.5 F (36.4 C)   Filed Weights   10/13/16 1132  Weight: 130 lb 6.4 oz (59.1 kg)    GENERAL:alert, no distress and comfortable SKIN: skin color, texture, turgor are normal, no rashes or significant lesions EYES: normal, Conjunctiva are pink and non-injected, sclera clear OROPHARYNX:no exudate, no erythema and lips, buccal mucosa, and tongue normal  NECK: supple, thyroid normal size, non-tender, without nodularity LYMPH:  no palpable lymphadenopathy in the cervical, axillary or inguinal LUNGS: clear to auscultation and percussion with normal breathing effort HEART: regular rate & rhythm and no murmurs and no lower extremity edema ABDOMEN:abdomen soft, non-tender and normal bowel sounds MUSCULOSKELETAL:no cyanosis of digits and no clubbing  NEURO: alert & oriented x 3 with fluent speech, no focal motor/sensory deficits EXTREMITIES: No lower extremity edema BREAST: No palpable masses or nodules in either right or left breasts. No palpable axillary supraclavicular or infraclavicular adenopathy no breast tenderness or nipple discharge. (exam performed in the presence of a chaperone)  LABORATORY DATA:  I have reviewed the data as listed   Chemistry      Component Value Date/Time   NA 135 (L) 10/13/2016 1109   K 3.5 10/13/2016 1109   CL 98 (L) 07/16/2016 0725   CO2 26 10/13/2016 1109   BUN 15.6 10/13/2016 1109   CREATININE 1.0 10/13/2016 1109      Component Value Date/Time   CALCIUM 9.9 10/13/2016 1109   ALKPHOS 79 10/13/2016 1109   AST 19 10/13/2016 1109   ALT 18 10/13/2016 1109   BILITOT 0.32  10/13/2016 1109       Lab Results  Component Value Date   WBC 5.1 10/13/2016   HGB 12.1 10/13/2016   HCT 37.5 10/13/2016   MCV 86.1 10/13/2016   PLT 334 10/13/2016   NEUTROABS 3.9 10/13/2016     ASSESSMENT & PLAN:  Malignant neoplasm of upper-inner quadrant of left female breast ([place]) Left lumpectomy 06/17/2016: IDC grade 2, 2.4 cm, with intermediate grade DCIS, LVID present, margins negative, 0/4 lymph nodes positive, ER 60%, PR 0%, HER-2 negative ratio 1.42, Ki-67 15%, T2 N0 stage II a Adjuvant radiation therapy started 07/17/2016 completed 08/14/2016  Treatment plan: Adjuvant Herceptin every 3 weeks started 08/01/2017for 1 year with anastrozole started 09/01/2016  Patient's cardiologist Dr. Carlyle Dolly to do her echocardiograms every 3 months for 1 year. -------------------------------------------------------------------------------------------------------------------------------- Current treatment: Herceptin every 3 weeks + anastrozole 1 mg daily started 10/13/2016 Monitoringclosely for toxicities.  Patient has not been physically active. I encouraged her to do one outside activity every day. I encouraged her  to participate in church activities. She does he she does not feel strong enough or anything outside. I encouraged her to still try.  Return to clinic every 6 weeks for follow-up every 3 weeks with Herceptin.   No orders of the defined types were placed in this encounter.  The patient has a good understanding of the overall plan. she agrees with it. she will call with any problems that may develop before the next visit here.   Rulon Eisenmenger, MD 10/13/16

## 2016-10-13 NOTE — Patient Instructions (Signed)
[facility] Discharge Instructions for Patients Receiving Chemotherapy  Today you received the following chemotherapy agents:  Herceptin  To help prevent nausea and vomiting after your treatment, we encourage you to take your nausea medication as prescribed.   If you develop nausea and vomiting that is not controlled by your nausea medication, call the clinic.   BELOW ARE SYMPTOMS THAT SHOULD BE REPORTED IMMEDIATELY:  *FEVER GREATER THAN 100.5 F  *CHILLS WITH OR WITHOUT FEVER  NAUSEA AND VOMITING THAT IS NOT CONTROLLED WITH YOUR NAUSEA MEDICATION  *UNUSUAL SHORTNESS OF BREATH  *UNUSUAL BRUISING OR BLEEDING  TENDERNESS IN MOUTH AND THROAT WITH OR WITHOUT PRESENCE OF ULCERS  *URINARY PROBLEMS  *BOWEL PROBLEMS  UNUSUAL RASH Items with * indicate a potential emergency and should be followed up as soon as possible.  Feel free to call the clinic you have any questions or concerns. The clinic phone number is [phone].  Please show the CHEMO ALERT CARD at check-in to the Emergency Department and triage nurse.   

## 2016-10-13 NOTE — Assessment & Plan Note (Signed)
Left lumpectomy 06/17/2016: IDC grade 2, 2.4 cm, with intermediate grade DCIS, LVID present, margins negative, 0/4 lymph nodes positive, ER 60%, PR 0%, HER-2 negative ratio 1.42, Ki-67 15%, T2 N0 stage II a Adjuvant radiation therapy started 07/17/2016 completed 08/14/2016  Treatment plan: Adjuvant Herceptin every 3 weeks started 08/01/2017for 1 year with anastrozole started 09/01/2016  Patient's cardiologist Dr. Carlyle Dolly to do her echocardiograms every 3 months for 1 year. -------------------------------------------------------------------------------------------------------------------------------- Current treatment: Herceptin every 3 weeks + anastrozole 1 mg daily started 09/01/2016 Monitoringclosely for toxicities.  Anastrozole toxicities:  Return to clinic every 6 weeks for follow-up every 3 weeks with Herceptin.

## 2016-10-16 DIAGNOSIS — C50212 Malignant neoplasm of upper-inner quadrant of left female breast: Secondary | ICD-10-CM | POA: Diagnosis not present

## 2016-11-02 ENCOUNTER — Telehealth: Payer: Self-pay

## 2016-11-02 ENCOUNTER — Other Ambulatory Visit: Payer: Self-pay

## 2016-11-02 DIAGNOSIS — Z79899 Other long term (current) drug therapy: Secondary | ICD-10-CM

## 2016-11-02 DIAGNOSIS — C50212 Malignant neoplasm of upper-inner quadrant of left female breast: Secondary | ICD-10-CM

## 2016-11-02 DIAGNOSIS — Z5181 Encounter for therapeutic drug level monitoring: Secondary | ICD-10-CM

## 2016-11-02 DIAGNOSIS — Z170 Estrogen receptor positive status [ER+]: Principal | ICD-10-CM

## 2016-11-02 NOTE — Telephone Encounter (Signed)
Called pt to discuss need for ECHO as last ECHO was performed 06/2016 and pt is currently receiving Herceptin therapy.  Upon chart review, pt got her last ECHO at [facility].  Order entered and called pt to ask her if she wished to make this appt herself or if she requested [place] to make this appt.  Pt reports she would like for her daughter to make this appt.  Gave pt the scheduling number to set this up.  Pt verbalized understanding and knows her appts remain scheduled for treatment tomorrow as it is okay to proceed with Herceptin.  Pt without further questions or concerns at time of call.

## 2016-11-02 NOTE — Telephone Encounter (Signed)
Called pt and left vm regarding getting her scheduled for echocardiogram. Pt last echo back in 07/06/16. Orders placed in epic.

## 2016-11-03 ENCOUNTER — Other Ambulatory Visit (HOSPITAL_BASED_OUTPATIENT_CLINIC_OR_DEPARTMENT_OTHER): Payer: Medicare Other

## 2016-11-03 ENCOUNTER — Ambulatory Visit (HOSPITAL_BASED_OUTPATIENT_CLINIC_OR_DEPARTMENT_OTHER): Payer: Medicare Other

## 2016-11-03 VITALS — BP 134/83 | HR 83 | Temp 98.2°F | Resp 18

## 2016-11-03 DIAGNOSIS — C50412 Malignant neoplasm of upper-outer quadrant of left female breast: Secondary | ICD-10-CM

## 2016-11-03 DIAGNOSIS — C50212 Malignant neoplasm of upper-inner quadrant of left female breast: Secondary | ICD-10-CM

## 2016-11-03 DIAGNOSIS — Z170 Estrogen receptor positive status [ER+]: Secondary | ICD-10-CM

## 2016-11-03 DIAGNOSIS — Z5112 Encounter for antineoplastic immunotherapy: Secondary | ICD-10-CM | POA: Diagnosis not present

## 2016-11-03 LAB — COMPREHENSIVE METABOLIC PANEL
ALT: 17 U/L (ref 0–55)
ANION GAP: 10 meq/L (ref 3–11)
AST: 18 U/L (ref 5–34)
Albumin: 3.5 g/dL (ref 3.5–5.0)
Alkaline Phosphatase: 72 U/L (ref 40–150)
BUN: 14.4 mg/dL (ref 7.0–26.0)
CHLORIDE: 96 meq/L — AB (ref 98–109)
CO2: 27 meq/L (ref 22–29)
Calcium: 10.1 mg/dL (ref 8.4–10.4)
Creatinine: 1.1 mg/dL (ref 0.6–1.1)
EGFR: 51 mL/min/{1.73_m2} — AB (ref >90)
Glucose: 113 mg/dl (ref 70–140)
Potassium: 4 mEq/L (ref 3.5–5.1)
Sodium: 133 mEq/L — ABNORMAL LOW (ref 136–145)
Total Bilirubin: 0.53 mg/dL (ref 0.20–1.20)
Total Protein: 7.6 g/dL (ref 6.4–8.3)

## 2016-11-03 LAB — CBC WITH DIFFERENTIAL/PLATELET
BASO%: 0.2 % (ref 0.0–2.0)
Basophils Absolute: 0 10*3/uL (ref 0.0–0.1)
EOS%: 0.4 % (ref 0.0–7.0)
Eosinophils Absolute: 0 10*3/uL (ref 0.0–0.5)
HCT: 36.7 % (ref 34.8–46.6)
HGB: 12.2 g/dL (ref 11.6–15.9)
LYMPH%: 21.1 % (ref 14.0–49.7)
MCH: 27.8 pg (ref 25.1–34.0)
MCHC: 33.2 g/dL (ref 31.5–36.0)
MCV: 83.6 fL (ref 79.5–101.0)
MONO#: 0.4 10*3/uL (ref 0.1–0.9)
MONO%: 8.1 % (ref 0.0–14.0)
NEUT#: 3.5 10*3/uL (ref 1.5–6.5)
NEUT%: 70.2 % (ref 38.4–76.8)
PLATELETS: 292 10*3/uL (ref 145–400)
RBC: 4.39 10*6/uL (ref 3.70–5.45)
RDW: 15.1 % — ABNORMAL HIGH (ref 11.2–14.5)
WBC: 4.9 10*3/uL (ref 3.9–10.3)
lymph#: 1 10*3/uL (ref 0.9–3.3)

## 2016-11-03 MED ORDER — HEPARIN SOD (PORK) LOCK FLUSH 100 UNIT/ML IV SOLN
500.0000 [IU] | Freq: Once | INTRAVENOUS | Status: AC | PRN
  Administered 2016-11-03: 500 [IU]
  Filled 2016-11-03: qty 5

## 2016-11-03 MED ORDER — SODIUM CHLORIDE 0.9% FLUSH
10.0000 mL | INTRAVENOUS | Status: DC | PRN
  Administered 2016-11-03: 10 mL
  Filled 2016-11-03: qty 10

## 2016-11-03 MED ORDER — DIPHENHYDRAMINE HCL 25 MG PO CAPS
50.0000 mg | ORAL_CAPSULE | Freq: Once | ORAL | Status: AC
  Administered 2016-11-03: 50 mg via ORAL

## 2016-11-03 MED ORDER — TRASTUZUMAB CHEMO 150 MG IV SOLR
6.0000 mg/kg | Freq: Once | INTRAVENOUS | Status: AC
  Administered 2016-11-03: 336 mg via INTRAVENOUS
  Filled 2016-11-03: qty 16

## 2016-11-03 MED ORDER — SODIUM CHLORIDE 0.9 % IV SOLN
Freq: Once | INTRAVENOUS | Status: AC
  Administered 2016-11-03: 12:00:00 via INTRAVENOUS

## 2016-11-03 MED ORDER — DIPHENHYDRAMINE HCL 25 MG PO CAPS
ORAL_CAPSULE | ORAL | Status: AC
  Filled 2016-11-03: qty 2

## 2016-11-03 MED ORDER — ACETAMINOPHEN 325 MG PO TABS
ORAL_TABLET | ORAL | Status: AC
Start: 2016-11-03 — End: 2016-11-03
  Filled 2016-11-03: qty 2

## 2016-11-03 MED ORDER — ACETAMINOPHEN 325 MG PO TABS
650.0000 mg | ORAL_TABLET | Freq: Once | ORAL | Status: AC
  Administered 2016-11-03: 650 mg via ORAL

## 2016-11-03 NOTE — Patient Instructions (Signed)
[facility] Discharge Instructions for Patients Receiving Chemotherapy  Today you received the following chemotherapy agents: Herceptin   To help prevent nausea and vomiting after your treatment, we encourage you to take your nausea medication as directed.    If you develop nausea and vomiting that is not controlled by your nausea medication, call the clinic.   BELOW ARE SYMPTOMS THAT SHOULD BE REPORTED IMMEDIATELY:  *FEVER GREATER THAN 100.5 F  *CHILLS WITH OR WITHOUT FEVER  NAUSEA AND VOMITING THAT IS NOT CONTROLLED WITH YOUR NAUSEA MEDICATION  *UNUSUAL SHORTNESS OF BREATH  *UNUSUAL BRUISING OR BLEEDING  TENDERNESS IN MOUTH AND THROAT WITH OR WITHOUT PRESENCE OF ULCERS  *URINARY PROBLEMS  *BOWEL PROBLEMS  UNUSUAL RASH Items with * indicate a potential emergency and should be followed up as soon as possible.  Feel free to call the clinic you have any questions or concerns. The clinic phone number is [phone].  Please show the CHEMO ALERT CARD at check-in to the Emergency Department and triage nurse.   

## 2016-11-04 ENCOUNTER — Encounter: Payer: Self-pay | Admitting: Neurology

## 2016-11-04 ENCOUNTER — Ambulatory Visit (INDEPENDENT_AMBULATORY_CARE_PROVIDER_SITE_OTHER): Payer: Medicare Other | Admitting: Neurology

## 2016-11-04 VITALS — BP 167/65 | HR 75 | Resp 20 | Ht 62.0 in | Wt 131.0 lb

## 2016-11-04 DIAGNOSIS — G4733 Obstructive sleep apnea (adult) (pediatric): Secondary | ICD-10-CM | POA: Insufficient documentation

## 2016-11-04 DIAGNOSIS — F5104 Psychophysiologic insomnia: Secondary | ICD-10-CM | POA: Diagnosis not present

## 2016-11-04 DIAGNOSIS — R5383 Other fatigue: Secondary | ICD-10-CM | POA: Diagnosis not present

## 2016-11-04 DIAGNOSIS — R0683 Snoring: Secondary | ICD-10-CM | POA: Diagnosis not present

## 2016-11-04 DIAGNOSIS — G4720 Circadian rhythm sleep disorder, unspecified type: Secondary | ICD-10-CM

## 2016-11-04 DIAGNOSIS — F418 Other specified anxiety disorders: Secondary | ICD-10-CM

## 2016-11-04 DIAGNOSIS — T451X5A Adverse effect of antineoplastic and immunosuppressive drugs, initial encounter: Secondary | ICD-10-CM

## 2016-11-04 DIAGNOSIS — D6481 Anemia due to antineoplastic chemotherapy: Secondary | ICD-10-CM | POA: Insufficient documentation

## 2016-11-04 DIAGNOSIS — F419 Anxiety disorder, unspecified: Secondary | ICD-10-CM

## 2016-11-04 DIAGNOSIS — F32A Depression, unspecified: Secondary | ICD-10-CM

## 2016-11-04 DIAGNOSIS — F329 Major depressive disorder, single episode, unspecified: Secondary | ICD-10-CM

## 2016-11-04 NOTE — Patient Instructions (Signed)
Please remember to try to maintain good sleep hygiene, which means: Keep a regular sleep and wake schedule, try not to exercise or have a meal within 2 hours of your bedtime, try to keep your bedroom conducive for sleep, that is, cool and dark, without light distractors such as an illuminated alarm clock, and refrain from watching TV right before sleep or in the middle of the night and do not keep the TV or radio on during the night. Also, try not to use or play on electronic devices at bedtime, such as your cell phone, tablet PC or laptop. If you like to read at bedtime on an electronic device, try to dim the background light as much as possible. Do not eat in the middle of the night.   We will request a sleep study.    We will look for leg twitching and snoring or sleep apnea.   Bring her Ativan to the sleep study night.  For chronic insomnia, you are best followed by a psychiatrist and/or sleep psychologist.   We will call you with the sleep study results and make a follow up appointment if needed.

## 2016-11-04 NOTE — Progress Notes (Signed)
SLEEP MEDICINE CLINIC   Provider:  Larey Seat, M D  Referring Provider: Rosita Fire, MD Primary Care Physician:  Rosita Fire, MD  Chief Complaint  Patient presents with  . New Patient (Initial Visit)    insomnia and anxiety, on ativan, never had a sleep study, tried mirtazepine and melatonin (did not work), takes tylenol PM    HPI:  Charlotte [name] is a 82 y.o. female , seen here as a referral  from Dr. Legrand Rams for a sleep consultation,   ON July 27th of this year a breast cancer was finally identified as cause of her discomfort, fatigue, and  insomnia, she had a whole year of anxiety , unexplained weight loss, and illness- poorly defined.  This is per patients report.  Mrs. Escamilla had visited the local emergency room in [place] before many times and always was sent home without a diagnosis. She underwent a mammogram which did not indicate breast cancer but turned out that her breast cancer was difficult to find as it was located in the very outer upper quadrant of the left breast. In July she underwent surgery and now just begun chemotherapy. She has also a Port-A-Cath implanted into the right chest for ongoing chemotherapy. Mrs. Peake also had suffered from another breast cancer  Before-  she underwent a lumpectomy of the right breast in 2001. She has echocardiogram every 3 month while on Chemotherapy. She feels excessively sleepy since being on chemotherapy and after radiation.  Her doctors feel that she should be able to regain her appetite and alongside get some energy and enjoy her life. She feels that her insomnia and the frequent fragmentation of her sleep is a hindrance. She has anxiety and can only sleep 2-4 hours at a time and the rest of the night she spends pacing the halls. She is very anxious and her daughter describes her as almost jittery, restless, driven and pressured, to a frantic degree. She went to a psychologist to evaluate her anxiety and insomnia, but  supposedly was told that she doesn't have to return and doesn't need psychiatric or psychological help. She saw Dr. Harrington Challenger in [place]. She is now here to rule out organic causes for Insomnia.   Sleep habits are as follows: She usually takes a Tylenol PM by 10:00 but often doesn't feel that she is tired enough to go to sleep or even to retreat. She watches TV at night and sometimes may spend all day in the bedroom. She blames her fatigue for not being able to rise and do a lot of activities these days. She lives in her bedroom. This has affected her circadian rhythm and days and nights have started to look alike. Sleep can be distributed all over the day and short naps but at night she struggles to get to a continuous sustain sleep pattern. She has nocturia 3-4 times each night. She is unable to tell me a rise time -  7.30 the earliest, and always up because she needs to take medication- but goes back to bed. She may not get out of bed until 12 noon.  She also likes to read from the IPAD while in the bedroom,  which is another screen, she likes to watch TV which is screen light penetrating the bedroom. We have discussed this today in our meeting with her daughter, who is here today. She doesn't leave the house, very rarely exposes herself to day light. She cannot muster the energy to get up in AM,  I would like for her psychologist to discuss sleep hygiene rules with her as  I will also do today.  My goal would be for the patient to undergo a sleep study to rule out organic causes of insomnia.  These will include periodic limb movements, cardiac arrhythmias, hypoxemia or hypercapnia and apnea.   Sleep medical history and family sleep history:  No known apnea in the family.   Social history: married , lives with her husband , who still drives.   Review of Systems: Out of a complete 14 system review, the patient complains of only the following symptoms, and all other reviewed systems are  negative. Fatigue , anxiety, panic attacks, shortness of breath sometimes with palpitations, no diaphoresis, no syncope, excessive daytime sleepiness. No established circadian rhythm anymore.  I also reviewed her medication some of them will make her drowsy probably into the next day. These include Vistaril, Benadryl, she is on fluticasone, hydralazine 2 times daily, lorazepam also known as abdomen, Cozaar, melatonin. Arimidex  Epworth score 2 -  Daughter disputes this. , Fatigue severity score  N/a   , depression score n/a    Social History   Social History  . Marital status: Married    Spouse name: N/A  . Number of children: N/A  . Years of education: N/A   Occupational History  . Not on file.   Social History Main Topics  . Smoking status: Never Smoker  . Smokeless tobacco: Never Used  . Alcohol use No  . Drug use: No  . Sexual activity: Not Currently   Other Topics Concern  . Not on file   Social History Narrative  . No narrative on file    Family History  Problem Relation Age of Onset  . Heart attack Mother   . Cancer Mother     Past Medical History:  Diagnosis Date  . Anxiety   . Arthritis   . Cancer [facility])    breast cancer  . Fatigue   . History of hyperthyroidism    resolved, no medicaions for treatment at this time  . Hypertension   . Insomnia     Past Surgical History:  Procedure Laterality Date  . BREAST LUMPECTOMY     right side  . BREAST LUMPECTOMY WITH AXILLARY LYMPH NODE BIOPSY Left 06/17/2016   Procedure: BREAST LUMPECTOMY WITH AXILLARY LYMPH NODE BIOPSY;  Surgeon: Autumn Messing III, MD;  Location: [facility];  Service: General;  Laterality: Left;  . COLONOSCOPY    . DILATION AND CURETTAGE OF UTERUS    . EYE SURGERY     bilateral cataracts removed  . FOOT SURGERY    . PORTACATH PLACEMENT Right 07/16/2016   Procedure: INSERTION PORT-A-CATH RIGHT SUBLCLAVIAN;  Surgeon: Autumn Messing III, MD;  Location: [facility];  Service: General;  Laterality: Right;  .  TONSILLECTOMY      Current Outpatient Prescriptions  Medication Sig Dispense Refill  . anastrozole (ARIMIDEX) 1 MG tablet Take 1 tablet (1 mg total) by mouth daily. 90 tablet 3  . aspirin EC 81 MG tablet Take 81 mg by mouth daily.    . Calcium Carbonate-Vitamin D (CALCIUM 600+D3 PO) Take 1 tablet by mouth 2 (two) times daily.    . carbamide peroxide (DEBROX) 6.5 % otic solution Place 1 drop into both ears daily as needed (ear wax buildup).    . cycloSPORINE (RESTASIS) 0.05 % ophthalmic emulsion Place 1 drop into both eyes 2 (two) times daily.    . diphenhydramine-acetaminophen (TYLENOL  PM) 25-500 MG TABS tablet Take 1 tablet by mouth at bedtime as needed (sleep).    . felodipine (PLENDIL) 5 MG 24 hr tablet Take 5 mg by mouth daily.     . fluticasone (FLONASE) 50 MCG/ACT nasal spray Place 2 sprays into the nose daily as needed for allergies.    . hydrALAZINE (APRESOLINE) 50 MG tablet Take 1 tablet (50 mg total) by mouth 2 (two) times daily. 180 tablet 3  . Liniments (SALONPAS PAIN RELIEF PATCH) PADS Apply 1 each topically daily as needed (pain).    . LORazepam (ATIVAN) 1 MG tablet Take 0.5 mg by mouth every 8 (eight) hours as needed for anxiety or sleep.     Marland Kitchen losartan (COZAAR) 100 MG tablet Take 100 mg by mouth daily.    . Melatonin 5 MG TABS Take 5 mg by mouth at bedtime as needed (sleep).     . Multiple Vitamins-Minerals (CENTRUM ADULTS PO) Take 1 tablet by mouth daily.    . non-metallic deodorant Jethro Poling) MISC Apply 1 application topically daily as needed.    . Omega-3 Fatty Acids (FISH OIL TRIPLE STRENGTH) 1400 MG CAPS Take 1 tablet by mouth daily.    Vladimir Faster Glycol-Propyl Glycol (SYSTANE) 0.4-0.3 % SOLN Apply 1 drop to eye 2 (two) times daily.    Marland Kitchen tretinoin (RETIN-A) 0.025 % cream Apply 1 application topically at bedtime.   3   No current facility-administered medications for this visit.     Allergies as of 11/04/2016 - Review Complete 11/04/2016  Allergen Reaction Noted  . Latex  Swelling 02/02/2012  . Bextra [valdecoxib] Other (See Comments) 02/02/2012  . Motrin [ibuprofen] Other (See Comments)     Vitals: BP (!) 167/65   Pulse 75   Resp 20   Ht 5\' 2"  (1.575 m)   Wt 131 lb (59.4 kg)   BMI 23.96 kg/m  Last Weight:  Wt Readings from Last 1 Encounters:  11/04/16 131 lb (59.4 kg)   [id number] mass index is 23.96 kg/m.     Last Height:   Ht Readings from Last 1 Encounters:  11/04/16 5\' 2"  (1.575 m)    Physical exam:  General: The patient is awake, alert and appears not in acute distress. The patient is well groomed. Head: Normocephalic, atraumatic. Neck is supple. Mallampati 4,  neck circumference: 13 . 75 . Nasal airflow congested ,  Cardiovascular:  Regular rate and rhythm, without  murmurs or carotid bruit, and without distended neck veins. Respiratory: Lungs are clear to auscultation. Skin:  Without evidence of edema, or rash Trunk: BMI is 24 . The patient's posture is stooped   Neurologic exam : The patient is awake and alert, oriented to place and time.   Attention span & concentration ability appears limited Speech is fluent, with , dysphonia   Mood and affect are anxious , depressed and defensive. .  Cranial nerves: Pupils are equal and briskly reactive to light.  Extraocular movements  in vertical and horizontal planes intact and without nystagmus. Visual fields by finger perimetry are intact.  Facial sensation intact to fine touch.  Facial motor strength is symmetric and tongue and uvula move midline. Shoulder shrug was symmetrical.   Motor exam:  Normal tone, muscle bulk and symmetric strength in all extremities. Sensory:  Fine touch, pinprick and vibration were tested in all extremities. Proprioception tested in the upper extremities was normal. Coordination: Rapid alternating movements in the fingers/hands was normal. Finger-to-nose maneuver  normal without evidence of ataxia, dysmetria  or tremor. Gait and station: Patient walks without  assistive device and is able unassisted to climb up to the exam table. Strength within normal limits.  Stance is stable and normal.    Deep tendon reflexes: in the  upper and lower extremities are symmetric and intact.   The patient was advised of the nature of the diagnosed sleep disorder , the treatment options and risks for general a health and wellness arising from not treating the condition.  I spent more than 55  minutes of face to face time with the patient. Greater than 50% of time was spent in counseling and coordination of care. We have discussed the diagnosis and differential and I answered the patient's questions.     Assessment:  After physical and neurologic examination, review of laboratory studies,  Personal review of imaging studies, reports of other /same  Imaging studies ,  Results of polysomnography/ neurophysiology testing and pre-existing records as far as provided in visit., my assessment is   1)  I had to spend a lot of time eliciting a reliable history , and was only able to get information about the patient's sleep habits and rhythms and routines which at this time are not existing. Need for her to establish a bedtime I would like it to be between 10 and 11 PM I like her not to stay in bed longer than 9 AM and by that time rise for good to start the day if she feels very excessively fatigued and sleepy she may take a 30 minute nap but she has to stop limiting the time of this is otherwise detract from her nighttime sleep. She needs to see a psychologist for chronic insomnia.   2) I think that a great deal of her fatigue and sleepiness is also related to her chronic illness and to the counter treatments. There is her age to consider 2 and she has been through a lot of medical problems in a short period of time. Undoubtedly, this has created some anxiety and panic and she felt finally vindicated than the breast cancer was found but it still causes a lot of unease for her. I think  that she is a candidate that would respond better to an antidepressant with antianxiety effect, then to a benzodiazepine.  3) her daughter has reported that she snores, she has a cardiac history, she has a Port-A-Cath implanted, she has been treated for hypertension, she had multiple tests drawn in May with Dr. Legrand Rams , her blood pressure at this office visit was 189/71. The patient certainly has risk factors for obstructive sleep apnea.      Plan:  Treatment plan and additional workup : Patient guidance :  Please remember to try to maintain good sleep hygiene, which means: Keep a regular sleep and wake schedule, try not to exercise or have a meal within 2 hours of your bedtime, try to keep your bedroom conducive for sleep, that is, cool and dark, without light distractors such as an illuminated alarm clock, and refrain from watching TV right before sleep or in the middle of the night and do not keep the TV or radio on during the night. Also, try not to use or play on electronic devices at bedtime, such as your cell phone, tablet PC or laptop. If you like to read at bedtime on an electronic device, try to dim the background light as much as possible. Do not eat in the middle of the night.   We will  request a sleep study.    We will look for leg twitching and snoring or sleep apnea.   For chronic insomnia, you are best followed by a psychiatrist and/or sleep psychologist.   We will call you with the sleep study results and make a follow up appointment if needed.     Asencion Partridge Michaila Kenney MD  11/04/2016   CC: Rosita Fire, [address] [place], [place] [id number]

## 2016-11-17 ENCOUNTER — Ambulatory Visit (HOSPITAL_COMMUNITY)
Admission: RE | Admit: 2016-11-17 | Discharge: 2016-11-17 | Disposition: A | Discharge status: Home / Self Care | Payer: Medicare Other | Source: Ambulatory Visit | Attending: Hematology and Oncology | Admitting: Hematology and Oncology

## 2016-11-17 DIAGNOSIS — Z170 Estrogen receptor positive status [ER+]: Secondary | ICD-10-CM | POA: Diagnosis not present

## 2016-11-17 DIAGNOSIS — Z79899 Other long term (current) drug therapy: Secondary | ICD-10-CM | POA: Diagnosis not present

## 2016-11-17 DIAGNOSIS — Z5181 Encounter for therapeutic drug level monitoring: Secondary | ICD-10-CM

## 2016-11-17 DIAGNOSIS — I082 Rheumatic disorders of both aortic and tricuspid valves: Secondary | ICD-10-CM | POA: Insufficient documentation

## 2016-11-17 DIAGNOSIS — C50212 Malignant neoplasm of upper-inner quadrant of left female breast: Secondary | ICD-10-CM | POA: Diagnosis not present

## 2016-11-17 NOTE — Progress Notes (Signed)
*  PRELIMINARY RESULTS* Echocardiogram 2D Echocardiogram has been performed.  Charlotte [name] 11/17/2016, 1:49 PM

## 2016-11-23 NOTE — Assessment & Plan Note (Signed)
Left lumpectomy 06/17/2016: IDC grade 2, 2.4 cm, with intermediate grade DCIS, LVID present, margins negative, 0/4 lymph nodes positive, ER 60%, PR 0%, HER-2 negative ratio 1.42, Ki-67 15%, T2 N0 stage II a Adjuvant radiation therapy started 07/17/2016 completed 08/14/2016  Treatment plan: Adjuvant Herceptin every 3 weeks started 08/01/2017for 1 year with anastrozole started 09/01/2016 Patient's cardiologist Dr. Carlyle Dolly to do her echocardiograms every 3 months for 1 year. -------------------------------------------------------------------------------------------------------------------------------- Current treatment: Herceptin every 3 weeks + anastrozole 1 mg daily started 10/13/2016 Monitoringclosely for toxicities.  Patient has not been physically active. I encouraged her to do one outside activity every day. I encouraged her to participate in church activities. She does he she does not feel strong enough or anything outside. I encouraged her to still try.  Return to clinic every 6 weeks for follow-up every 3 weeks with Herceptin.

## 2016-11-24 ENCOUNTER — Ambulatory Visit (HOSPITAL_BASED_OUTPATIENT_CLINIC_OR_DEPARTMENT_OTHER): Payer: Medicare Other | Admitting: Hematology and Oncology

## 2016-11-24 ENCOUNTER — Ambulatory Visit: Payer: Self-pay

## 2016-11-24 ENCOUNTER — Telehealth: Payer: Self-pay | Admitting: Cardiology

## 2016-11-24 ENCOUNTER — Encounter: Payer: Self-pay | Admitting: Hematology and Oncology

## 2016-11-24 ENCOUNTER — Other Ambulatory Visit (HOSPITAL_BASED_OUTPATIENT_CLINIC_OR_DEPARTMENT_OTHER): Payer: Medicare Other

## 2016-11-24 DIAGNOSIS — C50212 Malignant neoplasm of upper-inner quadrant of left female breast: Secondary | ICD-10-CM | POA: Diagnosis not present

## 2016-11-24 DIAGNOSIS — Z170 Estrogen receptor positive status [ER+]: Secondary | ICD-10-CM | POA: Diagnosis not present

## 2016-11-24 DIAGNOSIS — Z79811 Long term (current) use of aromatase inhibitors: Secondary | ICD-10-CM

## 2016-11-24 LAB — COMPREHENSIVE METABOLIC PANEL
ALT: 17 U/L (ref 0–55)
ANION GAP: 10 meq/L (ref 3–11)
AST: 18 U/L (ref 5–34)
Albumin: 3.5 g/dL (ref 3.5–5.0)
Alkaline Phosphatase: 69 U/L (ref 40–150)
BUN: 16 mg/dL (ref 7.0–26.0)
CHLORIDE: 99 meq/L (ref 98–109)
CO2: 29 meq/L (ref 22–29)
Calcium: 9.9 mg/dL (ref 8.4–10.4)
Creatinine: 1.1 mg/dL (ref 0.6–1.1)
EGFR: 51 mL/min/{1.73_m2} — AB (ref >90)
Glucose: 107 mg/dl (ref 70–140)
POTASSIUM: 3.9 meq/L (ref 3.5–5.1)
Sodium: 137 mEq/L (ref 136–145)
Total Bilirubin: 0.41 mg/dL (ref 0.20–1.20)
Total Protein: 7.7 g/dL (ref 6.4–8.3)

## 2016-11-24 LAB — CBC WITH DIFFERENTIAL/PLATELET
BASO%: 0.9 % (ref 0.0–2.0)
Basophils Absolute: 0 10*3/uL (ref 0.0–0.1)
EOS ABS: 0 10*3/uL (ref 0.0–0.5)
EOS%: 0.3 % (ref 0.0–7.0)
HCT: 37.6 % (ref 34.8–46.6)
HGB: 12.1 g/dL (ref 11.6–15.9)
LYMPH%: 20.5 % (ref 14.0–49.7)
MCH: 27.3 pg (ref 25.1–34.0)
MCHC: 32.3 g/dL (ref 31.5–36.0)
MCV: 84.4 fL (ref 79.5–101.0)
MONO#: 0.5 10*3/uL (ref 0.1–0.9)
MONO%: 8.7 % (ref 0.0–14.0)
NEUT#: 3.7 10*3/uL (ref 1.5–6.5)
NEUT%: 69.6 % (ref 38.4–76.8)
PLATELETS: 320 10*3/uL (ref 145–400)
RBC: 4.45 10*6/uL (ref 3.70–5.45)
RDW: 16.3 % — ABNORMAL HIGH (ref 11.2–14.5)
WBC: 5.3 10*3/uL (ref 3.9–10.3)
lymph#: 1.1 10*3/uL (ref 0.9–3.3)

## 2016-11-24 NOTE — Progress Notes (Signed)
Called Dr. Harl Bowie office (cardiologist) to find out when patient is able to restart herceptin infusion, since pt last echo shows declined in EF. Left vm for nurse and call back number. Pt currently on hold for herceptin treatment until further notice by cardiology.

## 2016-11-24 NOTE — Telephone Encounter (Signed)
Patient's EF has dropped in last 3-4 mths. Had to stop treatment. Would like to know from Cardiologist when she can restart treatment. / tg

## 2016-11-24 NOTE — Telephone Encounter (Signed)
Will forward to Dr. Branch. 

## 2016-11-24 NOTE — Progress Notes (Signed)
Patient Care Team: Rosita Fire, MD as PCP - General (Internal Medicine)  DIAGNOSIS:  Encounter Diagnosis  Name Primary?  . Malignant neoplasm of upper-inner quadrant of left breast in female, estrogen receptor positive ([place])     SUMMARY OF ONCOLOGIC HISTORY:   Malignant neoplasm of upper-inner quadrant of left female breast ([place])   06/02/2016 Initial Diagnosis    Left breast biopsy 11:00 position: IDC with DCIS, ER 60%, PR 0%, Ki-67 15%, HER-2 negative ratio 1.4 to      06/17/2016 Surgery    Left lumpectomy: IDC grade 2, 2.4 cm, with intermediate grade DCIS, LVID present, margins negative, 0/4 lymph nodes positive, ER 60%, PR 0%, HER-2 negative ratio 1.42, Ki-67 15%, T2 N0 stage II a      07/17/2016 - 08/14/2016 Radiation Therapy    Adjuvant radiation therapy      07/21/2016 -  Chemotherapy    Herceptin every 3 weeks 1 year along with anastrozole to start after radiation is complete      CHIEF COMPLIANT: Follow-up on Herceptin with anastrozole  INTERVAL HISTORY: Charlotte [name] is a 82 year old with above-mentioned history left breast cancer treated with lumpectomy and is currently on Herceptin with anastrozole. She is tolerating Herceptin extremely well. She does not have any nausea vomiting or any other gastrointestinal problems. She is tolerating anastrozole fairly well. She does not have any hot flashes. She does have intermittent arthritis/myalgias. She is having difficulty with sleep and is scheduled to undergo sleep study. She may also be slightly depressed. She is not able to receive Herceptin today because of decrease in the heart ejection fraction. She has a cardiologist in [place] Dr. Harl Bowie who I will request that she be seen  REVIEW OF SYSTEMS:   Constitutional: Denies fevers, chills or abnormal weight loss Eyes: Denies blurriness of vision Ears, nose, mouth, throat, and face: Denies mucositis or sore throat Respiratory: Denies cough, dyspnea or  wheezes Cardiovascular: Denies palpitation, chest discomfort Gastrointestinal:  Denies nausea, heartburn or change in bowel habits Skin: Denies abnormal skin rashes Lymphatics: Denies new lymphadenopathy or easy bruising Neurological:Denies numbness, tingling or new weaknesses Behavioral/Psych: Mood is stable, no new changes  Extremities: No lower extremity edema Breast:  denies any pain or lumps or nodules in either breasts All other systems were reviewed with the patient and are negative.  I have reviewed the past medical history, past surgical history, social history and family history with the patient and they are unchanged from previous note.  ALLERGIES:  is allergic to latex; bextra [valdecoxib]; and motrin [ibuprofen].  MEDICATIONS:  Current Outpatient Prescriptions  Medication Sig Dispense Refill  . anastrozole (ARIMIDEX) 1 MG tablet Take 1 tablet (1 mg total) by mouth daily. 90 tablet 3  . aspirin EC 81 MG tablet Take 81 mg by mouth daily.    . Calcium Carbonate-Vitamin D (CALCIUM 600+D3 PO) Take 1 tablet by mouth 2 (two) times daily.    . carbamide peroxide (DEBROX) 6.5 % otic solution Place 1 drop into both ears daily as needed (ear wax buildup).    . cycloSPORINE (RESTASIS) 0.05 % ophthalmic emulsion Place 1 drop into both eyes 2 (two) times daily.    . felodipine (PLENDIL) 5 MG 24 hr tablet Take 5 mg by mouth daily.     . fluticasone (FLONASE) 50 MCG/ACT nasal spray Place 2 sprays into the nose daily as needed for allergies.    . hydrALAZINE (APRESOLINE) 50 MG tablet Take 1 tablet (50 mg total) by mouth  2 (two) times daily. 180 tablet 3  . Liniments (SALONPAS PAIN RELIEF PATCH) PADS Apply 1 each topically daily as needed (pain).    . LORazepam (ATIVAN) 1 MG tablet Take 0.5 mg by mouth every 8 (eight) hours as needed for anxiety or sleep.     Marland Kitchen losartan (COZAAR) 100 MG tablet Take 100 mg by mouth daily.    . Multiple Vitamins-Minerals (CENTRUM ADULTS PO) Take 1 tablet by  mouth daily.    . non-metallic deodorant Jethro Poling) MISC Apply 1 application topically daily as needed.    . Omega-3 Fatty Acids (FISH OIL TRIPLE STRENGTH) 1400 MG CAPS Take 1 tablet by mouth daily.    Vladimir Faster Glycol-Propyl Glycol (SYSTANE) 0.4-0.3 % SOLN Apply 1 drop to eye 2 (two) times daily.    Marland Kitchen tretinoin (RETIN-A) 0.025 % cream Apply 1 application topically at bedtime.   3   No current facility-administered medications for this visit.     PHYSICAL EXAMINATION: ECOG PERFORMANCE STATUS: 1 - Symptomatic but completely ambulatory  Vitals:   11/24/16 1126  BP: (!) 165/51  Pulse: 79  Resp: 18  Temp: 98.1 F (36.7 C)   Filed Weights   11/24/16 1126  Weight: 130 lb (59 kg)    GENERAL:alert, no distress and comfortable SKIN: skin color, texture, turgor are normal, no rashes or significant lesions EYES: normal, Conjunctiva are pink and non-injected, sclera clear OROPHARYNX:no exudate, no erythema and lips, buccal mucosa, and tongue normal  NECK: supple, thyroid normal size, non-tender, without nodularity LYMPH:  no palpable lymphadenopathy in the cervical, axillary or inguinal LUNGS: clear to auscultation and percussion with normal breathing effort HEART: regular rate & rhythm and no murmurs and no lower extremity edema ABDOMEN:abdomen soft, non-tender and normal bowel sounds MUSCULOSKELETAL:no cyanosis of digits and no clubbing  NEURO: alert & oriented x 3 with fluent speech, no focal motor/sensory deficits EXTREMITIES: No lower extremity edema  LABORATORY DATA:  I have reviewed the data as listed   Chemistry      Component Value Date/Time   NA 137 11/24/2016 1114   K 3.9 11/24/2016 1114   CL 98 (L) 07/16/2016 0725   CO2 29 11/24/2016 1114   BUN 16.0 11/24/2016 1114   CREATININE 1.1 11/24/2016 1114      Component Value Date/Time   CALCIUM 9.9 11/24/2016 1114   ALKPHOS 69 11/24/2016 1114   AST 18 11/24/2016 1114   ALT 17 11/24/2016 1114   BILITOT 0.41 11/24/2016  1114       Lab Results  Component Value Date   WBC 5.3 11/24/2016   HGB 12.1 11/24/2016   HCT 37.6 11/24/2016   MCV 84.4 11/24/2016   PLT 320 11/24/2016   NEUTROABS 3.7 11/24/2016    ASSESSMENT & PLAN:  Malignant neoplasm of upper-inner quadrant of left female breast ([place]) Left lumpectomy 06/17/2016: IDC grade 2, 2.4 cm, with intermediate grade DCIS, LVID present, margins negative, 0/4 lymph nodes positive, ER 60%, PR 0%, HER-2 negative ratio 1.42, Ki-67 15%, T2 N0 stage II a Adjuvant radiation therapy started 07/17/2016 completed 08/14/2016  Treatment plan: Adjuvant Herceptin every 3 weeks started 08/01/2017for 1 year with anastrozole started 09/01/2016 Patient's cardiologist Dr. Carlyle Dolly to do her echocardiograms every 3 months for 1 year. -------------------------------------------------------------------------------------------------------------------------------- Current treatment: Herceptin every 3 weeks + anastrozole 1 mg daily started 10/13/2016 Monitoringclosely for toxicities. Echocardiogram showed an ejection fraction of 50% on 11/17/2016 I discussed with her that her original ejection fraction was 60-65%. Because of this decrease in  ejection fraction, I would like her to see cardiology. We will hold off on further Herceptin treatment center she gets cleared by cardiology. Patient sees Dr. Harl Bowie in [place]   Patient has not been physically active. I encouraged her to do one outside activity every day. I encouraged her to participate in church activities. She does he she does not feel strong enough or anything outside. I encouraged her to still try.  Return to clinic every 6 weeks for follow-up to see if she can resume Herceptin.   No orders of the defined types were placed in this encounter.  The patient has a good understanding of the overall plan. she agrees with it. she will call with any problems that may develop before the next visit here.    Rulon Eisenmenger, MD 11/24/16

## 2016-11-25 ENCOUNTER — Ambulatory Visit (INDEPENDENT_AMBULATORY_CARE_PROVIDER_SITE_OTHER): Payer: Medicare Other | Admitting: Neurology

## 2016-11-25 DIAGNOSIS — G4733 Obstructive sleep apnea (adult) (pediatric): Secondary | ICD-10-CM | POA: Diagnosis not present

## 2016-11-25 DIAGNOSIS — F5104 Psychophysiologic insomnia: Secondary | ICD-10-CM

## 2016-11-25 DIAGNOSIS — F419 Anxiety disorder, unspecified: Secondary | ICD-10-CM

## 2016-11-25 DIAGNOSIS — R0683 Snoring: Secondary | ICD-10-CM

## 2016-11-25 DIAGNOSIS — F329 Major depressive disorder, single episode, unspecified: Secondary | ICD-10-CM

## 2016-11-25 DIAGNOSIS — G4720 Circadian rhythm sleep disorder, unspecified type: Secondary | ICD-10-CM

## 2016-11-25 DIAGNOSIS — D6481 Anemia due to antineoplastic chemotherapy: Secondary | ICD-10-CM

## 2016-11-25 DIAGNOSIS — T451X5A Adverse effect of antineoplastic and immunosuppressive drugs, initial encounter: Secondary | ICD-10-CM

## 2016-11-25 DIAGNOSIS — R5383 Other fatigue: Secondary | ICD-10-CM

## 2016-11-25 DIAGNOSIS — F32A Depression, unspecified: Secondary | ICD-10-CM

## 2016-11-25 NOTE — Telephone Encounter (Signed)
Spoke to pt's daughter, let her know she has an appointment Friday @ 2 with Dr. Harl Bowie.

## 2016-11-25 NOTE — Telephone Encounter (Signed)
Can she see me 2pm on Friday to discuss   Zandra Abts MD

## 2016-11-27 ENCOUNTER — Ambulatory Visit (INDEPENDENT_AMBULATORY_CARE_PROVIDER_SITE_OTHER): Payer: Medicare Other | Admitting: Cardiology

## 2016-11-27 ENCOUNTER — Encounter: Payer: Self-pay | Admitting: Cardiology

## 2016-11-27 VITALS — BP 165/70 | HR 84 | Ht 64.0 in | Wt 132.0 lb

## 2016-11-27 DIAGNOSIS — I427 Cardiomyopathy due to drug and external agent: Secondary | ICD-10-CM | POA: Diagnosis not present

## 2016-11-27 MED ORDER — METOPROLOL SUCCINATE ER 25 MG PO TB24: 25.0000 mg | ORAL_TABLET | Freq: Every day | ORAL | 3 refills | Status: DC

## 2016-11-27 NOTE — Patient Instructions (Addendum)
Medication Instructions:  START TOPROL XL 12.5 MG - TAKE ONCE DAILY    Labwork: NONE  Testing/Procedures: NONE  Follow-Up: Your physician recommends that you schedule a follow-up appointment in: 2 WEEKS    Any Other Special Instructions Will Be Listed Below (If Applicable).     If you need a refill on your cardiac medications before your next appointment, please call your pharmacy.

## 2016-11-27 NOTE — Progress Notes (Signed)
Clinical Summary Ms. Sieler is a 82 y.o.female for follow up of the following medical problems.   1. Decrease in LV systolic dysfunction - echo 10/2016 LVEF 50%, down from 60-65%. Grade II diastolic dysfunction. PASP 64.  - hereceptin started 07/21/16, anastrazole started 09/01/16.  - she has chronic fatigue that is unchanged. No recent SOB/DOE/LE edema    2. Breast cancer - history of lumpectomy 05/2016  and radiation (radiation completed 07/2016), currently on herceptin maintenance. Recently started on anastrazole.  - hereceptin started 07/21/16, anastrazole started 09/01/16.     Past Medical History:  Diagnosis Date  . Anxiety   . Arthritis   . Cancer [facility])    breast cancer  . Fatigue   . History of hyperthyroidism    resolved, no medicaions for treatment at this time  . Hypertension   . Insomnia      Allergies  Allergen Reactions  . Latex Swelling    SEVERITY OF SWELLING NOT DEFINED.  Marland Kitchen Bextra [Valdecoxib] Other (See Comments)    Stomach Pains  . Motrin [Ibuprofen] Other (See Comments)    Stomach pain     Current Outpatient Prescriptions  Medication Sig Dispense Refill  . anastrozole (ARIMIDEX) 1 MG tablet Take 1 tablet (1 mg total) by mouth daily. 90 tablet 3  . aspirin EC 81 MG tablet Take 81 mg by mouth daily.    . Calcium Carbonate-Vitamin D (CALCIUM 600+D3 PO) Take 1 tablet by mouth 2 (two) times daily.    . carbamide peroxide (DEBROX) 6.5 % otic solution Place 1 drop into both ears daily as needed (ear wax buildup).    . cycloSPORINE (RESTASIS) 0.05 % ophthalmic emulsion Place 1 drop into both eyes 2 (two) times daily.    . felodipine (PLENDIL) 5 MG 24 hr tablet Take 5 mg by mouth daily.     . fluticasone (FLONASE) 50 MCG/ACT nasal spray Place 2 sprays into the nose daily as needed for allergies.    . hydrALAZINE (APRESOLINE) 50 MG tablet Take 1 tablet (50 mg total) by mouth 2 (two) times daily. 180 tablet 3  . Liniments (SALONPAS PAIN RELIEF PATCH) PADS  Apply 1 each topically daily as needed (pain).    . LORazepam (ATIVAN) 1 MG tablet Take 0.5 mg by mouth every 8 (eight) hours as needed for anxiety or sleep.     Marland Kitchen losartan (COZAAR) 100 MG tablet Take 100 mg by mouth daily.    . Multiple Vitamins-Minerals (CENTRUM ADULTS PO) Take 1 tablet by mouth daily.    . non-metallic deodorant Jethro Poling) MISC Apply 1 application topically daily as needed.    . Omega-3 Fatty Acids (FISH OIL TRIPLE STRENGTH) 1400 MG CAPS Take 1 tablet by mouth daily.    Vladimir Faster Glycol-Propyl Glycol (SYSTANE) 0.4-0.3 % SOLN Apply 1 drop to eye 2 (two) times daily.    Marland Kitchen tretinoin (RETIN-A) 0.025 % cream Apply 1 application topically at bedtime.   3   No current facility-administered medications for this visit.      Past Surgical History:  Procedure Laterality Date  . BREAST LUMPECTOMY     right side  . BREAST LUMPECTOMY WITH AXILLARY LYMPH NODE BIOPSY Left 06/17/2016   Procedure: BREAST LUMPECTOMY WITH AXILLARY LYMPH NODE BIOPSY;  Surgeon: Autumn Messing III, MD;  Location: [facility];  Service: General;  Laterality: Left;  . COLONOSCOPY    . DILATION AND CURETTAGE OF UTERUS    . EYE SURGERY     bilateral cataracts  removed  . FOOT SURGERY    . PORTACATH PLACEMENT Right 07/16/2016   Procedure: INSERTION PORT-A-CATH RIGHT SUBLCLAVIAN;  Surgeon: Autumn Messing III, MD;  Location: [facility];  Service: General;  Laterality: Right;  . TONSILLECTOMY       Allergies  Allergen Reactions  . Latex Swelling    SEVERITY OF SWELLING NOT DEFINED.  Marland Kitchen Bextra [Valdecoxib] Other (See Comments)    Stomach Pains  . Motrin [Ibuprofen] Other (See Comments)    Stomach pain      Family History  Problem Relation Age of Onset  . Heart attack Mother   . Cancer Mother      Social History Ms. Talbot reports that she has never smoked. She has never used smokeless tobacco. Ms. Finkelstein reports that she does not drink alcohol.   Review of Systems CONSTITUTIONAL: No weight loss, fever, chills,  weakness or fatigue.  HEENT: Eyes: No visual loss, blurred vision, double vision or yellow sclerae.No hearing loss, sneezing, congestion, runny nose or sore throat.  SKIN: No rash or itching.  CARDIOVASCULAR: per HPI RESPIRATORY: No shortness of breath, cough or sputum.  GASTROINTESTINAL: No anorexia, nausea, vomiting or diarrhea. No abdominal pain or blood.  GENITOURINARY: No burning on urination, no polyuria NEUROLOGICAL: No headache, dizziness, syncope, paralysis, ataxia, numbness or tingling in the extremities. No change in bowel or bladder control.  MUSCULOSKELETAL: No muscle, back pain, joint pain or stiffness.  LYMPHATICS: No enlarged nodes. No history of splenectomy.  PSYCHIATRIC: No history of depression or anxiety.  ENDOCRINOLOGIC: No reports of sweating, cold or heat intolerance. No polyuria or polydipsia.  Marland Kitchen   Physical Examination Vitals:   11/27/16 1359  BP: (!) 165/70  Pulse: 84   Vitals:   11/27/16 1359  Weight: 132 lb (59.9 kg)  Height: 5\' 4"  (1.626 m)    Gen: resting comfortably, no acute distress HEENT: no scleral icterus, pupils equal round and reactive, no palptable cervical adenopathy,  CV: RRR, no m/r/g, no jvd Resp: Clear to auscultation bilaterally GI: abdomen is soft, non-tender, non-distended, normal bowel sounds, no hepatosplenomegaly MSK: extremities are warm, no edema.  Skin: warm, no rash Neuro:  no focal deficits Psych: appropriate affect   Diagnostic Studies 07/2015 echo Study Conclusions  - Left ventricle: The cavity size was normal. Wall thickness was normal. Systolic function was normal. The estimated ejection fraction was in the range of 55% to 60%. Wall motion was normal; there were no regional wall motion abnormalities. Doppler parameters are consistent with abnormal left ventricular relaxation (grade 1 diastolic dysfunction). There is evidence of elevated LA pressure. - Aortic valve: Mildly calcified annulus.  Trileaflet; mildly thickened leaflets. Valve area (VTI): 1.78 cm^2. Valve area (Vmax): 1.91 cm^2. - Mitral valve: Mildly calcified annulus. Normal thickness leaflets . There was mild regurgitation. - Left atrium: The atrium was moderately dilated. - Atrial septum: No defect or patent foramen ovale was identified. - Pulmonary arteries: Systolic pressure was moderately increased. PA peak pressure: 49 mm Hg (S). - Technically adequate study.  10/2015 CT PE IMPRESSION: 1. No acute findings identified. No evidence for acute pulmonary embolus. 2. Aortic atherosclerosis and LAD coronary artery calcification.   06/2016 echo Study Conclusions  - Left ventricle: The cavity size was normal. Wall thickness was normal. Systolic function was normal. The estimated ejection fraction was in the range of 60% to 65%. Wall motion was normal; there were no regional wall motion abnormalities. Doppler parameters are consistent with abnormal left ventricular relaxation (grade 1 diastolic dysfunction).  Doppler parameters are consistent with high ventricular filling pressure. - Aortic valve: Valve area (VTI): 2.41 cm^2. Valve area (Vmax): 2.51 cm^2. Valve area (Vmean): 2.45 cm^2. - Mitral valve: There was mild regurgitation. - Left atrium: The atrium was moderately dilated. - Right atrium: The atrium was moderately dilated. - Pulmonary arteries: Systolic pressure was moderately increased. PA peak pressure: 51 mm Hg (S). - Technically adequate study.   10/2016 echo Study Conclusions  - Left ventricle: The cavity size was normal. Wall thickness was   increased in a pattern of moderate LVH. Systolic function was   mildly reduced. The estimated ejection fraction was 50%. LVEF   calculated at 46% by speckle tracking. Reduced global   longitudinal strain of -13.8%. Features are consistent with a   pseudonormal left ventricular filling pattern, with concomitant   abnormal  relaxation and increased filling pressure (grade 2   diastolic dysfunction). - Aortic valve: Mildly calcified annulus. Trileaflet; mildly   thickened leaflets. There was trivial regurgitation. - Mitral valve: There was mild regurgitation. - Left atrium: The atrium was moderately dilated. - Right atrium: Central venous pressure (est): 3 mm Hg. - Atrial septum: No defect or patent foramen ovale was identified. - Tricuspid valve: There was mild regurgitation. - Pulmonary arteries: Systolic pressure was severely increased. PA   peak pressure: 64 mm Hg (S). - Pericardium, extracardiac: There was no pericardial effusion.   Assessment and Plan   1. 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 Recent echo shows drop in LVEF from 60-65% to 50%, though still within the low normal range of function this drop correltes with her starting herceptin - recommend holding herceptin treatments minimum of 1 month. We will repeat echo at that time and decide on future course. Typically the LV systolic dysfunction is reversible, and typically can restart therapy. Start Toprol XL 12.5mg  daily, have her f/u in 2 weeks and if tolerates titrate up to 25mg  daily. Of note she has chronic fatigue prior to starting beta blocker  She will f/u in 2 weeks for medication titration. Will need repeat echo ordered at next visit  for early January.     Arnoldo Lenis, M.D.

## 2016-12-07 NOTE — Procedures (Signed)
PATIENT'S NAME:  Charlotte [name], Charlotte [name] DOB:      [birth year]      MR#:    [id number]     DATE OF RECORDING: 11/25/2016 REFERRING M.D.:  Rosita Fire MD Study Performed:   Baseline Polysomnogram HISTORY: This female  82 year old patient was on July 27th 2017 diagnosed with breast cancer, and a possible contributing cause of her discomfort, fatigue, and  insomnia, anxiety , unexplained weight loss, and illness- poorly defined, was thereby identified. The patient describes poor sleep quality, insomnia and is snoring.    ANXIETY, ARTHRITIS, CANCER, FATIGUE, HYPERTHYROIDISM, HYPERTENSION, INSOMNIA  The patient endorsed the Epworth Sleepiness Scale at 2/24 points.   The patient's weight 131 pounds with a height of 62 (inches), resulting in a BMI of 23.9 kg/m2. The patient's neck circumference measured 13.8 inches.  CURRENT MEDICATIONS: Arimidex , Aspirin, calcium, Restasis, Tylenol, Plendil, Flonase, Apresoline, Salonpas Pain relief patches, Ativan, Cozaar, Melatonin, Centrum , Fish oil Triple Strength, Systane,  Retin-A   PROCEDURE:  This is a multichannel digital polysomnogram utilizing the Somnostar 11.2 system.  Electrodes and sensors were applied and monitored per AASM Specifications.   EEG, EOG, Chin and Limb EMG, were sampled at 200 Hz.  ECG, Snore and Nasal Pressure, Thermal Airflow, Respiratory Effort, CPAP Flow and Pressure, Oximetry was sampled at 50 Hz. Digital video and audio were recorded.      BASELINE STUDYLights Out was at 21:25 and Lights On at 04:59.  Total recording time (TRT) was 454 minutes, with a total sleep time (TST) of  249 minutes.   The patient's sleep latency was 47 minutes.  REM latency was 60.5 minutes.  The sleep efficiency was 54.8 %.     SLEEP ARCHITECTURE: WASO (Wake after sleep onset) was 162 minutes.  There were 10.5 minutes in Stage N1, 197 minutes Stage N2, 0 minutes Stage N3 and 41.5 minutes in Stage REM.  The percentage of Stage N1 was 4.2%, Stage N2 was 79.1%, Stage N3  was 0% and Stage R (REM sleep) was 16.7%.   RESPIRATORY ANALYSIS:  There were a total of 67 respiratory events:  2 obstructive apneas, 0 central apneas and 0 mixed apneas with a total of 2 apneas and an apnea index (AI) of .5 /hour. There were 65 hypopneas with a hypopnea index of 15.7 /hour. The patient also had 22 respiratory event related arousals (RERAs).     The total APNEA/HYPOPNEA INDEX (AHI) was 16.1/hour and the total RESPIRATORY DISTURBANCE INDEX was 21.4 /hour.  0 events occurred in REM sleep and 130 events in NREM. The REM AHI was 0 /hour, versus a non-REM AHI of 19.4. The patient spent 0 minutes of total sleep time in the supine position and 249 minutes in non-supine.. The supine AHI was 0.0 versus a non-supine AHI of 16.2.  OXYGEN SATURATION & C02:  The Wake baseline 02 saturation was 96%, with the lowest being 75%. Time spent below 89% saturation equaled 77 minutes.   PERIODIC LIMB MOVEMENTS:   The patient had a total of 0 Periodic Limb Movements.     Audio and video analysis did not show any abnormal or unusual movements, behaviors, phonations or vocalizations.  The patient took two bathroom breaks. Snoring was noted. RERAs were associated.   EKG was in keeping with normal sinus rhythm (NSR) but isolated  PVCs were seen.    IMPRESSION: Mrs. Dake clearly has sleep apnea, likely worsened by the sleep aid she took for the study.  Her AHI was  16 and neither REM sleep nor supine positional sleep were recorded, both factors could exacerbate snoring and apnea. There were over 77 minutes of hypoxemia noted.  Insomnia is not solely related to apnea.    RECOMMENDATIONS: OSA therapy: Return for a full night CPAP titration , which can also treat snoring and hypoxemia.     1. Consider dedicated sleep psychology referral if insomnia is of clinical concern.   2. A follow up appointment will be scheduled in the Sleep Clinic at [facility]. The referring provider  will be notified of the results.      I certify that I have reviewed the entire raw data recording prior to the issuance of this report in accordance with the Standards of Accreditation of the American Academy of Sleep Medicine (AASM)      Larey Seat, MD  12-05-2016 Diplomat, American Board of Psychiatry and Neurology  Diplomat, American Board of [place] Director, [employer] [facility] [employer]

## 2016-12-08 ENCOUNTER — Telehealth: Payer: Self-pay

## 2016-12-08 ENCOUNTER — Telehealth: Payer: Self-pay | Admitting: Neurology

## 2016-12-08 DIAGNOSIS — G4733 Obstructive sleep apnea (adult) (pediatric): Secondary | ICD-10-CM

## 2016-12-08 NOTE — Telephone Encounter (Signed)
-----   Message from Larey Seat, MD sent at 12/07/2016 10:48 AM EST ----- Patient has apnea, mild , but AHI 16 was associated with hypoxemia, RERAs and arousals . Return for CPAP recommended. If patient is willing, I will put order in.

## 2016-12-08 NOTE — Telephone Encounter (Signed)
I spoke to patient and daughter. They are aware of results and recommendations. They are willing to proceed with titration study. Please place titration order.

## 2016-12-08 NOTE — Telephone Encounter (Signed)
I ordered the CPAP study/

## 2016-12-08 NOTE — Telephone Encounter (Signed)
Titration study ordered

## 2016-12-09 ENCOUNTER — Telehealth: Payer: Self-pay | Admitting: Cardiology

## 2016-12-09 ENCOUNTER — Emergency Department (HOSPITAL_COMMUNITY): Payer: Medicare Other

## 2016-12-09 ENCOUNTER — Encounter (HOSPITAL_COMMUNITY): Payer: Self-pay | Admitting: Emergency Medicine

## 2016-12-09 ENCOUNTER — Inpatient Hospital Stay (HOSPITAL_COMMUNITY)
Admission: EM | Admit: 2016-12-09 | Discharge: 2016-12-12 | DRG: 291 | Disposition: A | Discharge status: Home / Self Care | Payer: Medicare Other | Attending: Nephrology | Admitting: Nephrology

## 2016-12-09 DIAGNOSIS — Z886 Allergy status to analgesic agent status: Secondary | ICD-10-CM

## 2016-12-09 DIAGNOSIS — G4733 Obstructive sleep apnea (adult) (pediatric): Secondary | ICD-10-CM | POA: Diagnosis present

## 2016-12-09 DIAGNOSIS — C50212 Malignant neoplasm of upper-inner quadrant of left female breast: Secondary | ICD-10-CM | POA: Diagnosis not present

## 2016-12-09 DIAGNOSIS — Z923 Personal history of irradiation: Secondary | ICD-10-CM

## 2016-12-09 DIAGNOSIS — I110 Hypertensive heart disease with heart failure: Secondary | ICD-10-CM | POA: Diagnosis not present

## 2016-12-09 DIAGNOSIS — I10 Essential (primary) hypertension: Secondary | ICD-10-CM | POA: Diagnosis present

## 2016-12-09 DIAGNOSIS — R0602 Shortness of breath: Secondary | ICD-10-CM | POA: Diagnosis not present

## 2016-12-09 DIAGNOSIS — F411 Generalized anxiety disorder: Secondary | ICD-10-CM | POA: Diagnosis present

## 2016-12-09 DIAGNOSIS — J9601 Acute respiratory failure with hypoxia: Secondary | ICD-10-CM | POA: Diagnosis not present

## 2016-12-09 DIAGNOSIS — I2721 Secondary pulmonary arterial hypertension: Secondary | ICD-10-CM | POA: Diagnosis present

## 2016-12-09 DIAGNOSIS — I5043 Acute on chronic combined systolic (congestive) and diastolic (congestive) heart failure: Secondary | ICD-10-CM

## 2016-12-09 DIAGNOSIS — R402142 Coma scale, eyes open, spontaneous, at arrival to emergency department: Secondary | ICD-10-CM | POA: Diagnosis present

## 2016-12-09 DIAGNOSIS — J189 Pneumonia, unspecified organism: Secondary | ICD-10-CM | POA: Diagnosis present

## 2016-12-09 DIAGNOSIS — Z7722 Contact with and (suspected) exposure to environmental tobacco smoke (acute) (chronic): Secondary | ICD-10-CM | POA: Diagnosis present

## 2016-12-09 DIAGNOSIS — Z79899 Other long term (current) drug therapy: Secondary | ICD-10-CM

## 2016-12-09 DIAGNOSIS — R402362 Coma scale, best motor response, obeys commands, at arrival to emergency department: Secondary | ICD-10-CM | POA: Diagnosis present

## 2016-12-09 DIAGNOSIS — Z9842 Cataract extraction status, left eye: Secondary | ICD-10-CM

## 2016-12-09 DIAGNOSIS — R402252 Coma scale, best verbal response, oriented, at arrival to emergency department: Secondary | ICD-10-CM | POA: Diagnosis present

## 2016-12-09 DIAGNOSIS — Z809 Family history of malignant neoplasm, unspecified: Secondary | ICD-10-CM

## 2016-12-09 DIAGNOSIS — Z8249 Family history of ischemic heart disease and other diseases of the circulatory system: Secondary | ICD-10-CM

## 2016-12-09 DIAGNOSIS — J9600 Acute respiratory failure, unspecified whether with hypoxia or hypercapnia: Secondary | ICD-10-CM | POA: Diagnosis present

## 2016-12-09 DIAGNOSIS — J90 Pleural effusion, not elsewhere classified: Secondary | ICD-10-CM

## 2016-12-09 DIAGNOSIS — E876 Hypokalemia: Secondary | ICD-10-CM | POA: Diagnosis present

## 2016-12-09 DIAGNOSIS — Z79811 Long term (current) use of aromatase inhibitors: Secondary | ICD-10-CM

## 2016-12-09 DIAGNOSIS — Z9841 Cataract extraction status, right eye: Secondary | ICD-10-CM

## 2016-12-09 DIAGNOSIS — Z91040 Latex allergy status: Secondary | ICD-10-CM

## 2016-12-09 DIAGNOSIS — Z7982 Long term (current) use of aspirin: Secondary | ICD-10-CM

## 2016-12-09 DIAGNOSIS — M1990 Unspecified osteoarthritis, unspecified site: Secondary | ICD-10-CM | POA: Diagnosis present

## 2016-12-09 DIAGNOSIS — Z888 Allergy status to other drugs, medicaments and biological substances status: Secondary | ICD-10-CM

## 2016-12-09 HISTORY — DX: Obstructive sleep apnea (adult) (pediatric): G47.33

## 2016-12-09 LAB — CBC
HEMATOCRIT: 35.2 % — AB (ref 36.0–46.0)
HEMOGLOBIN: 11.9 g/dL — AB (ref 12.0–15.0)
MCH: 27.5 pg (ref 26.0–34.0)
MCHC: 33.8 g/dL (ref 30.0–36.0)
MCV: 81.5 fL (ref 78.0–100.0)
Platelets: 307 10*3/uL (ref 150–400)
RBC: 4.32 MIL/uL (ref 3.87–5.11)
RDW: 15.4 % (ref 11.5–15.5)
WBC: 4.3 10*3/uL (ref 4.0–10.5)

## 2016-12-09 LAB — URINALYSIS, ROUTINE W REFLEX MICROSCOPIC
BILIRUBIN URINE: NEGATIVE
GLUCOSE, UA: NEGATIVE mg/dL
HGB URINE DIPSTICK: NEGATIVE
Ketones, ur: NEGATIVE mg/dL
Leukocytes, UA: NEGATIVE
Nitrite: NEGATIVE
PH: 7 (ref 5.0–8.0)
Protein, ur: NEGATIVE mg/dL
SPECIFIC GRAVITY, URINE: 1.009 (ref 1.005–1.030)

## 2016-12-09 LAB — POCT I-STAT TROPONIN I: Troponin i, poc: 0.02 ng/mL (ref 0.00–0.08)

## 2016-12-09 LAB — BASIC METABOLIC PANEL
ANION GAP: 8 (ref 5–15)
BUN: 16 mg/dL (ref 6–20)
CO2: 28 mmol/L (ref 22–32)
Calcium: 9.6 mg/dL (ref 8.9–10.3)
Chloride: 97 mmol/L — ABNORMAL LOW (ref 101–111)
Creatinine, Ser: 1.01 mg/dL — ABNORMAL HIGH (ref 0.44–1.00)
GFR calc Af Amer: 57 mL/min — ABNORMAL LOW (ref >60)
GFR calc non Af Amer: 49 mL/min — ABNORMAL LOW (ref >60)
GLUCOSE: 105 mg/dL — AB (ref 65–99)
POTASSIUM: 3.4 mmol/L — AB (ref 3.5–5.1)
Sodium: 133 mmol/L — ABNORMAL LOW (ref 135–145)

## 2016-12-09 LAB — CBG MONITORING, ED: Glucose-Capillary: 112 mg/dL — ABNORMAL HIGH (ref 65–99)

## 2016-12-09 MED ORDER — ONDANSETRON HCL 4 MG/2ML IJ SOLN: 4.0000 mg | Freq: Four times a day (QID) | INTRAMUSCULAR | Status: DC | PRN

## 2016-12-09 MED ORDER — DEXTROSE 5 % IV SOLN
1.0000 g | INTRAVENOUS | Status: DC
  Administered 2016-12-09: 1 g via INTRAVENOUS
  Filled 2016-12-09: qty 10

## 2016-12-09 MED ORDER — ACETAMINOPHEN 650 MG RE SUPP: 650.0000 mg | Freq: Four times a day (QID) | RECTAL | Status: DC | PRN

## 2016-12-09 MED ORDER — ENOXAPARIN SODIUM 40 MG/0.4ML ~~LOC~~ SOLN
40.0000 mg | Freq: Every day | SUBCUTANEOUS | Status: DC
  Administered 2016-12-10 – 2016-12-11 (×3): 40 mg via SUBCUTANEOUS
  Filled 2016-12-09 (×3): qty 0.4

## 2016-12-09 MED ORDER — LOSARTAN POTASSIUM 50 MG PO TABS
100.0000 mg | ORAL_TABLET | Freq: Every day | ORAL | Status: DC
  Administered 2016-12-11 – 2016-12-12 (×2): 100 mg via ORAL
  Filled 2016-12-09 (×3): qty 2

## 2016-12-09 MED ORDER — HYDRALAZINE HCL 50 MG PO TABS
50.0000 mg | ORAL_TABLET | Freq: Two times a day (BID) | ORAL | Status: DC
  Administered 2016-12-10 – 2016-12-12 (×5): 50 mg via ORAL
  Filled 2016-12-09 (×6): qty 1

## 2016-12-09 MED ORDER — DEXTROSE 5 % IV SOLN
1.0000 g | Freq: Two times a day (BID) | INTRAVENOUS | Status: DC
  Administered 2016-12-10 – 2016-12-11 (×4): 1 g via INTRAVENOUS
  Filled 2016-12-09 (×5): qty 1

## 2016-12-09 MED ORDER — ACETAMINOPHEN 325 MG PO TABS: 650.0000 mg | ORAL_TABLET | Freq: Four times a day (QID) | ORAL | Status: DC | PRN

## 2016-12-09 MED ORDER — METOPROLOL SUCCINATE ER 25 MG PO TB24
12.5000 mg | ORAL_TABLET | Freq: Every day | ORAL | Status: DC
  Administered 2016-12-11 – 2016-12-12 (×2): 12.5 mg via ORAL
  Filled 2016-12-09 (×3): qty 1

## 2016-12-09 MED ORDER — DEXTROSE 5 % IV SOLN
500.0000 mg | INTRAVENOUS | Status: DC
  Administered 2016-12-09 – 2016-12-10 (×2): 500 mg via INTRAVENOUS
  Filled 2016-12-09 (×3): qty 500

## 2016-12-09 MED ORDER — HYDRALAZINE HCL 20 MG/ML IJ SOLN: 10.0000 mg | INTRAMUSCULAR | Status: DC | PRN

## 2016-12-09 MED ORDER — LORAZEPAM 0.5 MG PO TABS
0.5000 mg | ORAL_TABLET | Freq: Three times a day (TID) | ORAL | Status: DC | PRN
  Administered 2016-12-10 – 2016-12-11 (×3): 0.5 mg via ORAL
  Filled 2016-12-09 (×3): qty 1

## 2016-12-09 MED ORDER — VANCOMYCIN HCL IN DEXTROSE 750-5 MG/150ML-% IV SOLN
750.0000 mg | Freq: Every day | INTRAVENOUS | Status: DC
  Administered 2016-12-10 (×2): 750 mg via INTRAVENOUS
  Filled 2016-12-09 (×2): qty 150

## 2016-12-09 MED ORDER — METOPROLOL SUCCINATE ER 25 MG PO TB24: 12.5000 mg | ORAL_TABLET | Freq: Every day | ORAL | 3 refills | Status: DC

## 2016-12-09 MED ORDER — ENSURE ENLIVE PO LIQD
237.0000 mL | Freq: Two times a day (BID) | ORAL | Status: DC
  Administered 2016-12-10 – 2016-12-11 (×2): 237 mL via ORAL

## 2016-12-09 MED ORDER — OMEGA-3-ACID ETHYL ESTERS 1 G PO CAPS
2.0000 g | ORAL_CAPSULE | Freq: Every day | ORAL | Status: DC
  Administered 2016-12-10 – 2016-12-12 (×3): 2 g via ORAL
  Filled 2016-12-09 (×3): qty 2

## 2016-12-09 MED ORDER — ASPIRIN EC 81 MG PO TBEC
81.0000 mg | DELAYED_RELEASE_TABLET | Freq: Every day | ORAL | Status: DC
  Administered 2016-12-10 – 2016-12-12 (×3): 81 mg via ORAL
  Filled 2016-12-09 (×3): qty 1

## 2016-12-09 MED ORDER — POLYVINYL ALCOHOL 1.4 % OP SOLN
1.0000 [drp] | Freq: Two times a day (BID) | OPHTHALMIC | Status: DC
  Administered 2016-12-10 – 2016-12-12 (×6): 1 [drp] via OPHTHALMIC
  Filled 2016-12-09: qty 15

## 2016-12-09 MED ORDER — ANASTROZOLE 1 MG PO TABS
1.0000 mg | ORAL_TABLET | Freq: Every day | ORAL | Status: DC
  Administered 2016-12-12: 1 mg via ORAL
  Filled 2016-12-09 (×5): qty 1

## 2016-12-09 MED ORDER — ONDANSETRON HCL 4 MG PO TABS: 4.0000 mg | ORAL_TABLET | Freq: Four times a day (QID) | ORAL | Status: DC | PRN

## 2016-12-09 MED ORDER — FELODIPINE ER 5 MG PO TB24
5.0000 mg | ORAL_TABLET | Freq: Every day | ORAL | Status: DC
  Administered 2016-12-11 – 2016-12-12 (×2): 5 mg via ORAL
  Filled 2016-12-09 (×3): qty 1

## 2016-12-09 MED ORDER — CYCLOSPORINE 0.05 % OP EMUL
1.0000 [drp] | Freq: Two times a day (BID) | OPHTHALMIC | Status: DC
  Administered 2016-12-10 – 2016-12-12 (×5): 1 [drp] via OPHTHALMIC
  Filled 2016-12-09 (×7): qty 1

## 2016-12-09 NOTE — ED Triage Notes (Signed)
Patient reports weakness and shortness of breath x2 weeks since the patient has been put on anastrozole 1 gm and metoprolol 25 mg. Denies pain, dizziness, n/v/d, or light headedness.

## 2016-12-09 NOTE — H&P (Signed)
History and Physical    Charlotte [name] [id number] DOB: [birth year] DOA: 12/09/2016  PCP: Rosita Fire, MD  Patient coming from: Home.  Chief Complaint: Shortness of breath.  HPI: Charlotte [name] is a 82 y.o. female with history of breast cancer status post lumpectomy on anastrozole presents to the ER because of increasing shortness of breath. Patient over last 1 week as been having exertional shortness of breath. Denies any chest pain fever chills productive cough. Patient was initially started on Herceptin by patient's oncologist for the breast cancer, which was stopped recently after patient's 2-D echo showed a decreasing EF. 2-D echo done on 11/17/2016 showed EF of 50% with elevated pulmonary artery pressure and grade 2 diastolic dysfunction. Plan is to have a repeat 2-D echo done in 1 month and Herceptin was on hold. Chest x-ray done in the ER shows possible pneumonia and was started on antibiotics. Patient is being admitted for further observation.   ED Course: Chest x-ray shows possible pneumonia. Was started on antibiotics. Patient's blood pressure is elevated. EKG shows sinus bradycardia with rate around 52 bpm.  Review of Systems: As per HPI, rest all negative.   Past Medical History:  Diagnosis Date  . Anxiety   . Arthritis   . Cancer [facility])    breast cancer  . Fatigue   . History of hyperthyroidism    resolved, no medicaions for treatment at this time  . Hypertension   . Insomnia     Past Surgical History:  Procedure Laterality Date  . BREAST LUMPECTOMY     right side  . BREAST LUMPECTOMY WITH AXILLARY LYMPH NODE BIOPSY Left 06/17/2016   Procedure: BREAST LUMPECTOMY WITH AXILLARY LYMPH NODE BIOPSY;  Surgeon: Autumn Messing III, MD;  Location: [place];  Service: General;  Laterality: Left;  . COLONOSCOPY    . DILATION AND CURETTAGE OF UTERUS    . EYE SURGERY     bilateral cataracts removed  . FOOT SURGERY    . PORTACATH PLACEMENT Right 07/16/2016   Procedure: INSERTION  PORT-A-CATH RIGHT SUBLCLAVIAN;  Surgeon: Autumn Messing III, MD;  Location: [place];  Service: General;  Laterality: Right;  . TONSILLECTOMY       reports that she has never smoked. She has never used smokeless tobacco. She reports that she does not drink alcohol or use drugs.  Allergies  Allergen Reactions  . Latex Swelling    SEVERITY OF SWELLING NOT DEFINED.  Marland Kitchen Bextra [Valdecoxib] Other (See Comments)    Stomach Pains  . Motrin [Ibuprofen] Other (See Comments)    Stomach pain    Family History  Problem Relation Age of Onset  . Heart attack Mother   . Cancer Mother     Prior to Admission medications   Medication Sig Start Date End Date Taking? Authorizing Provider  anastrozole (ARIMIDEX) 1 MG tablet Take 1 tablet (1 mg total) by mouth daily. 10/13/16  Yes Nicholas Lose, MD  aspirin EC 81 MG tablet Take 81 mg by mouth daily.   Yes Historical Provider, MD  Calcium Carbonate-Vitamin D (CALCIUM 600+D3 PO) Take 1 tablet by mouth 2 (two) times daily.   Yes Historical Provider, MD  carbamide peroxide (DEBROX) 6.5 % otic solution Place 1 drop into both ears daily as needed (ear wax buildup).   Yes Historical Provider, MD  cycloSPORINE (RESTASIS) 0.05 % ophthalmic emulsion Place 1 drop into both eyes 2 (two) times daily.   Yes Historical Provider, MD  felodipine (PLENDIL) 5 MG 24 hr  tablet Take 5 mg by mouth daily.    Yes Historical Provider, MD  fluticasone (FLONASE) 50 MCG/ACT nasal spray Place 2 sprays into the nose daily as needed for allergies.   Yes Historical Provider, MD  hydrALAZINE (APRESOLINE) 50 MG tablet Take 1 tablet (50 mg total) by mouth 2 (two) times daily. 06/01/16  Yes Lendon Colonel, NP  [facility] PAIN RELIEF PATCH) PADS Apply 1 each topically daily as needed (pain).   Yes Historical Provider, MD  LORazepam (ATIVAN) 1 MG tablet Take 0.5 mg by mouth every 8 (eight) hours as needed for anxiety or sleep.    Yes Historical Provider, MD  losartan (COZAAR) 100 MG tablet  Take 100 mg by mouth daily.   Yes Historical Provider, MD  metoprolol succinate (TOPROL XL) 25 MG 24 hr tablet Take 0.5 tablets (12.5 mg total) by mouth daily. 12/09/16  Yes Arnoldo Lenis, MD  Multiple Vitamins-Minerals (CENTRUM ADULTS PO) Take 1 tablet by mouth daily.   Yes Historical Provider, MD  non-metallic deodorant Jethro Poling) MISC Apply 1 application topically daily as needed.   Yes Historical Provider, MD  Omega-3 Fatty Acids (FISH OIL TRIPLE STRENGTH) 1400 MG CAPS Take 1 tablet by mouth daily.   Yes Historical Provider, MD  Polyethyl Glycol-Propyl Glycol (SYSTANE) 0.4-0.3 % SOLN Apply 1 drop to eye 2 (two) times daily.   Yes Historical Provider, MD  tretinoin (RETIN-A) 0.025 % cream Apply 1 application topically at bedtime.  06/27/15  Yes Historical Provider, MD    Physical Exam: Vitals:   12/09/16 2030 12/09/16 2100 12/09/16 2130 12/09/16 2222  BP: 186/72 186/67 174/64 (!) 160/68  Pulse: (!) 49 (!) 47 (!) 50 (!) 51  Resp: 17  22   Temp:    97.7 F (36.5 C)  TempSrc:    Oral  SpO2: 94% 93% 95% 99%  Weight:    57 kg (125 lb 10.6 oz)  Height:    5\' 2"  (1.575 m)      Constitutional: Moderately built and nourished. Vitals:   12/09/16 2030 12/09/16 2100 12/09/16 2130 12/09/16 2222  BP: 186/72 186/67 174/64 (!) 160/68  Pulse: (!) 49 (!) 47 (!) 50 (!) 51  Resp: 17  22   Temp:    97.7 F (36.5 C)  TempSrc:    Oral  SpO2: 94% 93% 95% 99%  Weight:    57 kg (125 lb 10.6 oz)  Height:    5\' 2"  (1.575 m)   Eyes: Anicteric no pallor. ENMT: No discharge from the ears eyes nose and mouth. Neck: No mass felt. No neck rigidity. Elevated JVD. Respiratory: No rhonchi or crepitations. Cardiovascular: S1-S2 heard. No murmurs appreciated. Abdomen: Soft nontender bowel sounds present. No guarding or rigidity. Musculoskeletal: No edema. No joint effusion. Skin: No rash. Skin appears warm. Neurologic: Alert awake oriented to time place and person. Moves all extremities. Psychiatric:  Appears normal. Normal affect.   Labs on Admission: I have personally reviewed following labs and imaging studies  CBC:  Recent Labs Lab 12/09/16 1620  WBC 4.3  HGB 11.9*  HCT 35.2*  MCV 81.5  PLT [id number]   Basic Metabolic Panel:  Recent Labs Lab 12/09/16 1620  NA 133*  K 3.4*  CL 97*  CO2 28  GLUCOSE 105*  BUN 16  CREATININE 1.01*  CALCIUM 9.6   GFR: Estimated Creatinine Clearance: 31.6 mL/min (by C-G formula based on SCr of 1.01 mg/dL (H)). Liver Function Tests: No results for input(s): AST, ALT, ALKPHOS, BILITOT,  PROT, ALBUMIN in the last 168 hours. No results for input(s): LIPASE, AMYLASE in the last 168 hours. No results for input(s): AMMONIA in the last 168 hours. Coagulation Profile: No results for input(s): INR, PROTIME in the last 168 hours. Cardiac Enzymes: No results for input(s): CKTOTAL, CKMB, CKMBINDEX, TROPONINI in the last 168 hours. BNP (last 3 results) No results for input(s): PROBNP in the last 8760 hours. HbA1C: No results for input(s): HGBA1C in the last 72 hours. CBG:  Recent Labs Lab 12/09/16 1614  GLUCAP 112*   Lipid Profile: No results for input(s): CHOL, HDL, LDLCALC, TRIG, CHOLHDL, LDLDIRECT in the last 72 hours. Thyroid Function Tests: No results for input(s): TSH, T4TOTAL, FREET4, T3FREE, THYROIDAB in the last 72 hours. Anemia Panel: No results for input(s): VITAMINB12, FOLATE, FERRITIN, TIBC, IRON, RETICCTPCT in the last 72 hours. Urine analysis:    Component Value Date/Time   COLORURINE YELLOW 12/09/2016 1951   APPEARANCEUR CLEAR 12/09/2016 1951   LABSPEC 1.009 12/09/2016 1951   PHURINE 7.0 12/09/2016 1951   GLUCOSEU NEGATIVE 12/09/2016 1951   HGBUR NEGATIVE 12/09/2016 1951   BILIRUBINUR NEGATIVE 12/09/2016 1951   KETONESUR NEGATIVE 12/09/2016 1951   PROTEINUR NEGATIVE 12/09/2016 1951   UROBILINOGEN 0.2 10/14/2015 0824   NITRITE NEGATIVE 12/09/2016 1951   LEUKOCYTESUR NEGATIVE 12/09/2016 1951   Sepsis  Labs: @LABRCNTIP (procalcitonin:4,lacticidven:4) )No results found for this or any previous visit (from the past 240 hour(s)).   Radiological Exams on Admission: Dg Chest 2 View  Result Date: 12/09/2016 CLINICAL DATA:  Weakness, shortness of breath x2 weeks, recently diagnosed breast cancer EXAM: CHEST  2 VIEW COMPARISON:  07/16/2016 FINDINGS: Left upper lobe opacity, new, suspicious for pneumonia. Radiation changes are also possible in the appropriate clinical setting. Mild patchy opacities in the bilateral lower lobe opacities, atelectasis versus pneumonia. No pleural effusion or pneumothorax. Mild cardiomegaly. Right chest power port terminates at the cavoatrial junction. Surgical clips in the left axilla. IMPRESSION: Left upper lobe opacity, new, suspicious for pneumonia. Radiation changes are also possible in the appropriate clinical setting. Mild patchy opacities in the bilateral lower lobes, atelectasis versus pneumonia. Follow-up chest radiographs are suggested in 4-6 weeks to document clearance. Electronically Signed   By: Julian Hy M.D.   On: 12/09/2016 17:10    EKG: Independently reviewed. Sinus bradycardia.  Assessment/Plan Principal Problem:   Acute respiratory failure with hypoxia (HCC) Active Problems:   Generalized anxiety disorder   Malignant neoplasm of upper-inner quadrant of left female breast ([place])   Community acquired pneumonia   Essential hypertension    1. Acute respiratory failure with hypoxia with exertional shortness of breath - chest x-ray shows pneumonia for which patient is placed on empiric antibiotics. Patient does not have any obvious pneumonic symptoms. Will check d-dimer and if elevated will get CT angiogram of the chest. Would also check 2-D echo since likely cause could be worsening LV function and elevated pulmonary artery pressure contributing to patient's symptoms. Patient's blood pressure is also elevated which could be contributing to patient's  symptoms. 2. Uncontrolled hypertension - patient is on losartan metoprolol and Plendil. Closely follow blood pressure trends and I have placed patient on when necessary IV hydralazine. 3. Breast cancer per oncologist. 4. Recently diagnosed OSA - not yet started on CPAP.   DVT prophylaxis: Lovenox. Code Status: Full code.  Family Communication: Discussed with patient.  Disposition Plan: Home.  Consults called: None.  Admission status: Observation.    Rise Patience MD [facility] Pager [phone].  If  7PM-7AM, please contact night-coverage [email or web address] Password [email or web address]  12/09/2016, 11:12 PM

## 2016-12-09 NOTE — Telephone Encounter (Signed)
Pt is having side effects from the metoprolol succinate (TOPROL XL) 25 MG 24 hr tablet [id number]  She is very tried and feeling drained. Please give her a call @ [phone]

## 2016-12-09 NOTE — ED Notes (Signed)
Attempted to call report to floor 

## 2016-12-09 NOTE — Telephone Encounter (Signed)
Returned pt call, she complained of worsening SOB since this morning, she did not complain of any dizziness or chest pain. She stated her bp was 159/64 and her hr was 59. She could barely catch her breath while talking on the telephone with me. She stated that she was going to go the the ED to be checked out and I advised her that she should.

## 2016-12-09 NOTE — ED Notes (Signed)
ED Provider at bedside. 

## 2016-12-09 NOTE — ED Provider Notes (Signed)
[facility] DEPT Provider Note   CSN: [id number] Arrival date & time: 12/09/16  1557     History   Chief Complaint Chief Complaint  Patient presents with  . Weakness  . Shortness of Breath    HPI Charlotte [name] is a 82 y.o. female.  82 year old female presents with shortness of breath and weakness past and worse for the past 24 hours. She notes some dyspnea on exertion but denies any chest pain. No vomiting or diarrhea. No fevers noted at home. Denies any severe cough or congestion. States that she was recently started on a beta blocker which she says has exacerbated her symptoms of weakness. Denies any focality to them. No severe headaches. No mental status changes. Called her Dr. was told to come here      Past Medical History:  Diagnosis Date  . Anxiety   . Arthritis   . Cancer [facility])    breast cancer  . Fatigue   . History of hyperthyroidism    resolved, no medicaions for treatment at this time  . Hypertension   . Insomnia     Patient Active Problem List   Diagnosis Date Noted  . 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 Chronic insomnia 11/04/2016  . 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 OSA (obstructive sleep apnea) 11/04/2016  . Snoring 11/04/2016  . Fatigue due to treatment 11/04/2016  . Malignant neoplasm of upper-inner quadrant of left female breast ([place]) 07/01/2016  . Generalized anxiety disorder 01/28/2016  . SHOULDER, ARTHRITIS, DEGEN./OSTEO 12/23/2009  . IMPINGEMENT SYNDROME 12/23/2009  . HIGH BLOOD PRESSURE 12/23/2009    Past Surgical History:  Procedure Laterality Date  . BREAST LUMPECTOMY     right side  . BREAST LUMPECTOMY WITH AXILLARY LYMPH NODE BIOPSY Left 06/17/2016   Procedure: BREAST LUMPECTOMY WITH AXILLARY LYMPH NODE BIOPSY;  Surgeon: Autumn Messing III, MD;  Location: [place];  Service: General;  Laterality: Left;  . COLONOSCOPY    . DILATION AND CURETTAGE OF UTERUS    . EYE SURGERY     bilateral cataracts  removed  . FOOT SURGERY    . PORTACATH PLACEMENT Right 07/16/2016   Procedure: INSERTION PORT-A-CATH RIGHT SUBLCLAVIAN;  Surgeon: Autumn Messing III, MD;  Location: [place];  Service: General;  Laterality: Right;  . TONSILLECTOMY      OB History    Gravida Para Term Preterm AB Living   2 2 2     2    SAB TAB Ectopic Multiple Live Births                   Home Medications    Prior to Admission medications   Medication Sig Start Date End Date Taking? Authorizing Provider  anastrozole (ARIMIDEX) 1 MG tablet Take 1 tablet (1 mg total) by mouth daily. 10/13/16   Nicholas Lose, MD  aspirin EC 81 MG tablet Take 81 mg by mouth daily.    Historical Provider, MD  Calcium Carbonate-Vitamin D (CALCIUM 600+D3 PO) Take 1 tablet by mouth 2 (two) times daily.    Historical Provider, MD  carbamide peroxide (DEBROX) 6.5 % otic solution Place 1 drop into both ears daily as needed (ear wax buildup).    Historical Provider, MD  cycloSPORINE (RESTASIS) 0.05 % ophthalmic emulsion Place 1 drop into both eyes 2 (two) times daily.    Historical Provider, MD  felodipine (PLENDIL) 5 MG 24 hr tablet Take 5 mg by mouth daily.     Historical Provider, MD  fluticasone (  FLONASE) 50 MCG/ACT nasal spray Place 2 sprays into the nose daily as needed for allergies.    Historical Provider, MD  hydrALAZINE (APRESOLINE) 50 MG tablet Take 1 tablet (50 mg total) by mouth 2 (two) times daily. 06/01/16   Lendon Colonel, NP  [facility] PAIN RELIEF PATCH) PADS Apply 1 each topically daily as needed (pain).    Historical Provider, MD  LORazepam (ATIVAN) 1 MG tablet Take 0.5 mg by mouth every 8 (eight) hours as needed for anxiety or sleep.     Historical Provider, MD  losartan (COZAAR) 100 MG tablet Take 100 mg by mouth daily.    Historical Provider, MD  metoprolol succinate (TOPROL XL) 25 MG 24 hr tablet Take 0.5 tablets (12.5 mg total) by mouth daily. 12/09/16   Arnoldo Lenis, MD  Multiple Vitamins-Minerals (CENTRUM ADULTS  PO) Take 1 tablet by mouth daily.    Historical Provider, MD  non-metallic deodorant Jethro Poling) MISC Apply 1 application topically daily as needed.    Historical Provider, MD  Omega-3 Fatty Acids (FISH OIL TRIPLE STRENGTH) 1400 MG CAPS Take 1 tablet by mouth daily.    Historical Provider, MD  Polyethyl Glycol-Propyl Glycol (SYSTANE) 0.4-0.3 % SOLN Apply 1 drop to eye 2 (two) times daily.    Historical Provider, MD  tretinoin (RETIN-A) 0.025 % cream Apply 1 application topically at bedtime.  06/27/15   Historical Provider, MD    Family History Family History  Problem Relation Age of Onset  . Heart attack Mother   . Cancer Mother     Social History Social History  Substance Use Topics  . Smoking status: Never Smoker  . Smokeless tobacco: Never Used  . Alcohol use No     Allergies   Latex; Bextra [valdecoxib]; and Motrin [ibuprofen]   Review of Systems Review of Systems  All other systems reviewed and are negative.    Physical Exam Updated Vital Signs BP 156/81 (BP Location: Right Arm)   Pulse (!) 53   Temp 97.9 F (36.6 C) (Oral)   Resp 18   Ht 5\' 4"  (1.626 m)   Wt 59.9 kg   SpO2 97%   BMI 22.66 kg/m   Physical Exam  Constitutional: She is oriented to person, place, and time. She appears well-developed and well-nourished.  Non-toxic appearance. No distress.  HENT:  Head: Normocephalic and atraumatic.  Eyes: Conjunctivae, EOM and lids are normal. Pupils are equal, round, and reactive to light.  Neck: Normal range of motion. Neck supple. No tracheal deviation present. No thyroid mass present.  Cardiovascular: Normal rate, regular rhythm and normal heart sounds.  Exam reveals no gallop.   No murmur heard. Pulmonary/Chest: Effort normal and breath sounds normal. No stridor. No respiratory distress. She has no decreased breath sounds. She has no wheezes. She has no rhonchi. She has no rales.  Abdominal: Soft. Normal appearance and bowel sounds are normal. She exhibits no  distension. There is no tenderness. There is no rebound and no CVA tenderness.  Musculoskeletal: Normal range of motion. She exhibits no edema or tenderness.  Neurological: She is alert and oriented to person, place, and time. She has normal strength. No cranial nerve deficit or sensory deficit. GCS eye subscore is 4. GCS verbal subscore is 5. GCS motor subscore is 6.  Skin: Skin is warm and dry. No abrasion and no rash noted.  Psychiatric: She has a normal mood and affect. Her speech is normal and behavior is normal.  Nursing note and vitals  reviewed.    ED Treatments / Results  Labs (all labs ordered are listed, but only abnormal results are displayed) Labs Reviewed  BASIC METABOLIC PANEL - Abnormal; Notable for the following:       Result Value   Sodium 133 (*)    Potassium 3.4 (*)    Chloride 97 (*)    Glucose, Bld 105 (*)    Creatinine, Ser 1.01 (*)    GFR calc non Af Amer 49 (*)    GFR calc Af Amer 57 (*)    All other components within normal limits  CBC - Abnormal; Notable for the following:    Hemoglobin 11.9 (*)    HCT 35.2 (*)    All other components within normal limits  CBG MONITORING, ED - Abnormal; Notable for the following:    Glucose-Capillary 112 (*)    All other components within normal limits  URINALYSIS, ROUTINE W REFLEX MICROSCOPIC    EKG  EKG Interpretation None       Radiology Dg Chest 2 View  Result Date: 12/09/2016 CLINICAL DATA:  Weakness, shortness of breath x2 weeks, recently diagnosed breast cancer EXAM: CHEST  2 VIEW COMPARISON:  07/16/2016 FINDINGS: Left upper lobe opacity, new, suspicious for pneumonia. Radiation changes are also possible in the appropriate clinical setting. Mild patchy opacities in the bilateral lower lobe opacities, atelectasis versus pneumonia. No pleural effusion or pneumothorax. Mild cardiomegaly. Right chest power port terminates at the cavoatrial junction. Surgical clips in the left axilla. IMPRESSION: Left upper lobe  opacity, new, suspicious for pneumonia. Radiation changes are also possible in the appropriate clinical setting. Mild patchy opacities in the bilateral lower lobes, atelectasis versus pneumonia. Follow-up chest radiographs are suggested in 4-6 weeks to document clearance. Electronically Signed   By: Julian Hy M.D.   On: 12/09/2016 17:10    Procedures Procedures (including critical care time)  Medications Ordered in ED Medications - No data to display   Initial Impression / Assessment and Plan / ED Course  I have reviewed the triage vital signs and the nursing notes.  Pertinent labs & imaging results that were available during my care of the patient were reviewed by me and considered in my medical decision making (see chart for details).  Clinical Course     Patient's chest x-ray suspicious for pneumonia. Patient started on IV antibiotics and will consult hospitalist for admission for patient due to her profound weakness  Final Clinical Impressions(s) / ED Diagnoses   Final diagnoses:  None    New Prescriptions New Prescriptions   No medications on file     Lacretia Leigh, MD 12/09/16 2015

## 2016-12-10 ENCOUNTER — Encounter (HOSPITAL_COMMUNITY): Payer: Self-pay | Admitting: Radiology

## 2016-12-10 ENCOUNTER — Other Ambulatory Visit (HOSPITAL_COMMUNITY): Payer: Self-pay

## 2016-12-10 ENCOUNTER — Observation Stay (HOSPITAL_COMMUNITY): Payer: Medicare Other

## 2016-12-10 DIAGNOSIS — Z9842 Cataract extraction status, left eye: Secondary | ICD-10-CM | POA: Diagnosis not present

## 2016-12-10 DIAGNOSIS — I5041 Acute combined systolic (congestive) and diastolic (congestive) heart failure: Secondary | ICD-10-CM | POA: Diagnosis not present

## 2016-12-10 DIAGNOSIS — R402252 Coma scale, best verbal response, oriented, at arrival to emergency department: Secondary | ICD-10-CM | POA: Diagnosis present

## 2016-12-10 DIAGNOSIS — I10 Essential (primary) hypertension: Secondary | ICD-10-CM | POA: Diagnosis not present

## 2016-12-10 DIAGNOSIS — Z7722 Contact with and (suspected) exposure to environmental tobacco smoke (acute) (chronic): Secondary | ICD-10-CM | POA: Diagnosis present

## 2016-12-10 DIAGNOSIS — Z7982 Long term (current) use of aspirin: Secondary | ICD-10-CM | POA: Diagnosis not present

## 2016-12-10 DIAGNOSIS — J9600 Acute respiratory failure, unspecified whether with hypoxia or hypercapnia: Secondary | ICD-10-CM | POA: Diagnosis present

## 2016-12-10 DIAGNOSIS — Z886 Allergy status to analgesic agent status: Secondary | ICD-10-CM | POA: Diagnosis not present

## 2016-12-10 DIAGNOSIS — Z9841 Cataract extraction status, right eye: Secondary | ICD-10-CM | POA: Diagnosis not present

## 2016-12-10 DIAGNOSIS — M1990 Unspecified osteoarthritis, unspecified site: Secondary | ICD-10-CM | POA: Diagnosis present

## 2016-12-10 DIAGNOSIS — R402362 Coma scale, best motor response, obeys commands, at arrival to emergency department: Secondary | ICD-10-CM | POA: Diagnosis present

## 2016-12-10 DIAGNOSIS — I2721 Secondary pulmonary arterial hypertension: Secondary | ICD-10-CM | POA: Diagnosis present

## 2016-12-10 DIAGNOSIS — I5043 Acute on chronic combined systolic (congestive) and diastolic (congestive) heart failure: Secondary | ICD-10-CM | POA: Diagnosis not present

## 2016-12-10 DIAGNOSIS — R0602 Shortness of breath: Secondary | ICD-10-CM | POA: Diagnosis not present

## 2016-12-10 DIAGNOSIS — F411 Generalized anxiety disorder: Secondary | ICD-10-CM | POA: Diagnosis present

## 2016-12-10 DIAGNOSIS — Z79899 Other long term (current) drug therapy: Secondary | ICD-10-CM | POA: Diagnosis not present

## 2016-12-10 DIAGNOSIS — C50212 Malignant neoplasm of upper-inner quadrant of left female breast: Secondary | ICD-10-CM | POA: Diagnosis present

## 2016-12-10 DIAGNOSIS — I110 Hypertensive heart disease with heart failure: Secondary | ICD-10-CM | POA: Diagnosis present

## 2016-12-10 DIAGNOSIS — I272 Pulmonary hypertension, unspecified: Secondary | ICD-10-CM | POA: Diagnosis not present

## 2016-12-10 DIAGNOSIS — J189 Pneumonia, unspecified organism: Secondary | ICD-10-CM | POA: Diagnosis not present

## 2016-12-10 DIAGNOSIS — Z79811 Long term (current) use of aromatase inhibitors: Secondary | ICD-10-CM | POA: Diagnosis not present

## 2016-12-10 DIAGNOSIS — E876 Hypokalemia: Secondary | ICD-10-CM | POA: Diagnosis present

## 2016-12-10 DIAGNOSIS — I2729 Other secondary pulmonary hypertension: Secondary | ICD-10-CM | POA: Diagnosis not present

## 2016-12-10 DIAGNOSIS — R05 Cough: Secondary | ICD-10-CM | POA: Diagnosis not present

## 2016-12-10 DIAGNOSIS — J9601 Acute respiratory failure with hypoxia: Secondary | ICD-10-CM | POA: Diagnosis not present

## 2016-12-10 DIAGNOSIS — Z888 Allergy status to other drugs, medicaments and biological substances status: Secondary | ICD-10-CM | POA: Diagnosis not present

## 2016-12-10 DIAGNOSIS — R402142 Coma scale, eyes open, spontaneous, at arrival to emergency department: Secondary | ICD-10-CM | POA: Diagnosis present

## 2016-12-10 DIAGNOSIS — G4733 Obstructive sleep apnea (adult) (pediatric): Secondary | ICD-10-CM | POA: Diagnosis not present

## 2016-12-10 DIAGNOSIS — Z809 Family history of malignant neoplasm, unspecified: Secondary | ICD-10-CM | POA: Diagnosis not present

## 2016-12-10 DIAGNOSIS — R918 Other nonspecific abnormal finding of lung field: Secondary | ICD-10-CM | POA: Diagnosis not present

## 2016-12-10 DIAGNOSIS — Z923 Personal history of irradiation: Secondary | ICD-10-CM | POA: Diagnosis not present

## 2016-12-10 DIAGNOSIS — Z91040 Latex allergy status: Secondary | ICD-10-CM | POA: Diagnosis not present

## 2016-12-10 LAB — CBC
HEMATOCRIT: 33.2 % — AB (ref 36.0–46.0)
HEMATOCRIT: 34.2 % — AB (ref 36.0–46.0)
HEMOGLOBIN: 11.1 g/dL — AB (ref 12.0–15.0)
Hemoglobin: 11.5 g/dL — ABNORMAL LOW (ref 12.0–15.0)
MCH: 27.9 pg (ref 26.0–34.0)
MCH: 27.5 pg (ref 26.0–34.0)
MCHC: 33.4 g/dL (ref 30.0–36.0)
MCHC: 33.6 g/dL (ref 30.0–36.0)
MCV: 83.4 fL (ref 78.0–100.0)
MCV: 81.8 fL (ref 78.0–100.0)
PLATELETS: 294 10*3/uL (ref 150–400)
Platelets: 315 10*3/uL (ref 150–400)
RBC: 3.98 MIL/uL (ref 3.87–5.11)
RBC: 4.18 MIL/uL (ref 3.87–5.11)
RDW: 15.5 % (ref 11.5–15.5)
RDW: 15.8 % — ABNORMAL HIGH (ref 11.5–15.5)
WBC: 4.2 10*3/uL (ref 4.0–10.5)
WBC: 4.8 10*3/uL (ref 4.0–10.5)

## 2016-12-10 LAB — GLUCOSE, CAPILLARY: GLUCOSE-CAPILLARY: 105 mg/dL — AB (ref 65–99)

## 2016-12-10 LAB — BASIC METABOLIC PANEL
ANION GAP: 7 (ref 5–15)
BUN: 15 mg/dL (ref 6–20)
CO2: 30 mmol/L (ref 22–32)
Calcium: 9 mg/dL (ref 8.9–10.3)
Chloride: 97 mmol/L — ABNORMAL LOW (ref 101–111)
Creatinine, Ser: 0.98 mg/dL (ref 0.44–1.00)
GFR calc Af Amer: 59 mL/min — ABNORMAL LOW (ref >60)
GFR, EST NON AFRICAN AMERICAN: 51 mL/min — AB (ref >60)
GLUCOSE: 93 mg/dL (ref 65–99)
POTASSIUM: 3.2 mmol/L — AB (ref 3.5–5.1)
Sodium: 134 mmol/L — ABNORMAL LOW (ref 135–145)

## 2016-12-10 LAB — CREATININE, SERUM
Creatinine, Ser: 0.94 mg/dL (ref 0.44–1.00)
GFR calc Af Amer: >60 mL/min (ref >60)
GFR calc non Af Amer: 53 mL/min — ABNORMAL LOW (ref >60)

## 2016-12-10 LAB — TROPONIN I: Troponin I: 0.03 ng/mL (ref <0.03)

## 2016-12-10 LAB — STREP PNEUMONIAE URINARY ANTIGEN: STREP PNEUMO URINARY ANTIGEN: NEGATIVE

## 2016-12-10 LAB — BRAIN NATRIURETIC PEPTIDE: B NATRIURETIC PEPTIDE 5: 123.1 pg/mL — AB (ref 0.0–100.0)

## 2016-12-10 LAB — D-DIMER, QUANTITATIVE: D-Dimer, Quant: 1.59 ug/mL-FEU — ABNORMAL HIGH (ref 0.00–0.50)

## 2016-12-10 MED ORDER — POTASSIUM CHLORIDE CRYS ER 20 MEQ PO TBCR
40.0000 meq | EXTENDED_RELEASE_TABLET | Freq: Once | ORAL | Status: AC
  Administered 2016-12-10: 40 meq via ORAL
  Filled 2016-12-10: qty 2

## 2016-12-10 MED ORDER — IOPAMIDOL (ISOVUE-370) INJECTION 76%
100.0000 mL | Freq: Once | INTRAVENOUS | Status: AC | PRN
  Administered 2016-12-10: 100 mL via INTRAVENOUS

## 2016-12-10 MED ORDER — IPRATROPIUM-ALBUTEROL 0.5-2.5 (3) MG/3ML IN SOLN
3.0000 mL | Freq: Three times a day (TID) | RESPIRATORY_TRACT | Status: DC
  Administered 2016-12-10 – 2016-12-11 (×2): 3 mL via RESPIRATORY_TRACT
  Filled 2016-12-10 (×2): qty 3

## 2016-12-10 MED ORDER — IPRATROPIUM-ALBUTEROL 0.5-2.5 (3) MG/3ML IN SOLN
3.0000 mL | Freq: Four times a day (QID) | RESPIRATORY_TRACT | Status: DC
  Administered 2016-12-10: 3 mL via RESPIRATORY_TRACT
  Filled 2016-12-10: qty 3

## 2016-12-10 MED ORDER — SODIUM CHLORIDE 0.9% FLUSH
10.0000 mL | INTRAVENOUS | Status: DC | PRN
  Administered 2016-12-10 – 2016-12-12 (×3): 10 mL
  Filled 2016-12-10 (×3): qty 40

## 2016-12-10 MED ORDER — HYDRALAZINE HCL 20 MG/ML IJ SOLN: 10.0000 mg | INTRAMUSCULAR | Status: DC | PRN

## 2016-12-10 NOTE — Progress Notes (Signed)
CRITICAL VALUE ALERT  Critical value received:  Troponin 0.03  Date of notification:  12/10/16  Time of notification:  [id number]  Critical value read back:Yes.    Nurse who received alert:  Virgina Norfolk  MD notified (1st page):  schorr  Time of first page:  [phone]  MD notified (2nd page):  Time of second page:  Responding MD:  n/a  Time MD responded:  n/a

## 2016-12-10 NOTE — Progress Notes (Signed)
PROGRESS NOTE    Charlotte [name]  [id number] DOB: [birth year] DOA: 12/09/2016 PCP: Rosita Fire, MD   Brief Narrative: 82 y.o. female with history of breast cancer status post lumpectomy on anastrozole presents to the ER because of increasing shortness of breath. Patient over last 1 week as been having exertional shortness of breath. Denies any chest pain fever chills productive cough. Patient was initially started on Herceptin by patient's oncologist for the breast cancer, which was stopped recently after patient's 2-D echo showed a decreasing EF. 2-D echo done on 11/17/2016 showed EF of 50% with elevated pulmonary artery pressure and grade 2 diastolic dysfunction.Chest x-ray done in the ER shows possible pneumonia and was started on antibiotics.  Assessment & Plan:  # Shortness of breath and dyspnea on exertion possibly due to left upper lobe pneumonia and pulmonary hypertension: -CT scan of chest is concerning for left upper lobe pneumonia. No evidence of pulmonary embolism. There is also moderate left pleural effusion. May need pulmonology evaluation depending on clinical response. -Continue vancomycin and cefepime. Follow culture results. May be able to narrow down antibiotics soon. -I think that patient's symptoms are mostly related with pulmonary hypertension and underlying chronic lung disease. I reviewed echo from November this year. I consulted cardiologist for further evaluation. I will defer to cardiologist for further study including the repeat echocardiogram. -I do not see patient was hypoxic. Oxygen saturation acceptable in room air. Continue bronchodilators. -I will check respiratory virus, droplet precaution.  #Severe pulmonary hypertension: Currently on felodipine. Further evaluation and management as per cardiologist.  #Hypertension: Resume home medication. Monitor blood pressure.  #History of breast cancer status post radiation treatment, and follow up with  oncologist.  #Recently diagnosed with HER sleep apnea undergoing evaluation for CPAP.  #Hypokalemia: Replete potassium chloride. Repeat labs.   Principal Problem:   Acute respiratory failure with hypoxia (HCC) Active Problems:   Generalized anxiety disorder   Malignant neoplasm of upper-inner quadrant of left female breast ([place])   Community acquired pneumonia   Essential hypertension  PT, OT evaluation. DVT prophylaxis: Lovenox subcutaneous Code Status: Full code Family Communication: Discussed with the patient's son at bedside Disposition Plan: Likely discharge home in 1-2 days.  Consultants:   Consulted cardiologist today  Procedures: None vancomycin and cefepime. Antimicrobials:  Subjective: Patient was seen and examined at bedside. Patient reported shortness of breath and dyspnea on exertion worsened for last 1 week. Denied fever, chills, cough, nausea, vomiting, chest pressure shortness of breath. No recent travel.   Objective: Vitals:   12/09/16 2222 12/10/16 0446 12/10/16 1128 12/10/16 1500  BP: (!) 160/68 133/65 (!) 129/36 125/69  Pulse: (!) 51 (!) 52 (!) 59 66  Resp:  14 16 18   Temp: 97.7 F (36.5 C) 97.9 F (36.6 C)  99.1 F (37.3 C)  TempSrc: Oral Oral  Oral  SpO2: 99% 97% 97% 96%  Weight: 57 kg (125 lb 10.6 oz) 57 kg (125 lb 10.6 oz)    Height: 5\' 2"  (1.575 m)       Intake/Output Summary (Last 24 hours) at 12/10/16 1531 Last data filed at 12/10/16 1400  Gross per 24 hour  Intake             1180 ml  Output              700 ml  Net              480 ml   Filed Weights   12/09/16 1609 12/09/16 2222 12/10/16  0446  Weight: 59.9 kg (132 lb) 57 kg (125 lb 10.6 oz) 57 kg (125 lb 10.6 oz)    Examination:  General exam: Appears calm and comfortable. Speaking full sentences. Respiratory system: Bibasal decreased breath sound, respiratory effort normal. No wheezing.  Cardiovascular system: S1 & S2 heard, RRR.  No pedal edema. Gastrointestinal system:  Abdomen is nondistended, soft and nontender. Normal bowel sounds heard. Central nervous system: Alert and oriented. No focal neurological deficits. Extremities: Symmetric 5 x 5 power. Skin: No rashes, lesions or ulcers Psychiatry: Judgement and insight appear normal. Mood & affect appropriate.     Data Reviewed: I have personally reviewed following labs and imaging studies  CBC:  Recent Labs Lab 12/09/16 1620 12/09/16 2358 12/10/16 0524  WBC 4.3 4.2 4.8  HGB 11.9* 11.5* 11.1*  HCT 35.2* 34.2* 33.2*  MCV 81.5 81.8 83.4  PLT 307 294 [id number]   Basic Metabolic Panel:  Recent Labs Lab 12/09/16 1620 12/09/16 2358 12/10/16 0524  NA 133*  --  134*  K 3.4*  --  3.2*  CL 97*  --  97*  CO2 28  --  30  GLUCOSE 105*  --  93  BUN 16  --  15  CREATININE 1.01* 0.94 0.98  CALCIUM 9.6  --  9.0   GFR: Estimated Creatinine Clearance: 32.6 mL/min (by C-G formula based on SCr of 0.98 mg/dL). Liver Function Tests: No results for input(s): AST, ALT, ALKPHOS, BILITOT, PROT, ALBUMIN in the last 168 hours. No results for input(s): LIPASE, AMYLASE in the last 168 hours. No results for input(s): AMMONIA in the last 168 hours. Coagulation Profile: No results for input(s): INR, PROTIME in the last 168 hours. Cardiac Enzymes:  Recent Labs Lab 12/09/16 2358  TROPONINI 0.03*   BNP (last 3 results) No results for input(s): PROBNP in the last 8760 hours. HbA1C: No results for input(s): HGBA1C in the last 72 hours. CBG:  Recent Labs Lab 12/09/16 1614  GLUCAP 112*   Lipid Profile: No results for input(s): CHOL, HDL, LDLCALC, TRIG, CHOLHDL, LDLDIRECT in the last 72 hours. Thyroid Function Tests: No results for input(s): TSH, T4TOTAL, FREET4, T3FREE, THYROIDAB in the last 72 hours. Anemia Panel: No results for input(s): VITAMINB12, FOLATE, FERRITIN, TIBC, IRON, RETICCTPCT in the last 72 hours. Sepsis Labs: No results for input(s): PROCALCITON, LATICACIDVEN in the last 168 hours.  No  results found for this or any previous visit (from the past 240 hour(s)).       Radiology Studies: Dg Chest 2 View  Result Date: 12/09/2016 CLINICAL DATA:  Weakness, shortness of breath x2 weeks, recently diagnosed breast cancer EXAM: CHEST  2 VIEW COMPARISON:  07/16/2016 FINDINGS: Left upper lobe opacity, new, suspicious for pneumonia. Radiation changes are also possible in the appropriate clinical setting. Mild patchy opacities in the bilateral lower lobe opacities, atelectasis versus pneumonia. No pleural effusion or pneumothorax. Mild cardiomegaly. Right chest power port terminates at the cavoatrial junction. Surgical clips in the left axilla. IMPRESSION: Left upper lobe opacity, new, suspicious for pneumonia. Radiation changes are also possible in the appropriate clinical setting. Mild patchy opacities in the bilateral lower lobes, atelectasis versus pneumonia. Follow-up chest radiographs are suggested in 4-6 weeks to document clearance. Electronically Signed   By: Julian Hy M.D.   On: 12/09/2016 17:10   Ct Angio Chest Pe W Or Wo Contrast  Result Date: 12/10/2016 CLINICAL DATA:  Shortness of breath.  History breast carcinoma EXAM: CT ANGIOGRAPHY CHEST WITH CONTRAST TECHNIQUE: Multidetector CT imaging  of the chest was performed using the standard protocol during bolus administration of intravenous contrast. Multiplanar CT image reconstructions and MIPs were obtained to evaluate the vascular anatomy. CONTRAST:  100 mL Isovue 370 nonionic COMPARISON:  Chest CT May 25, 2016; chest radiograph December 09, 2016 FINDINGS: Cardiovascular: There is no demonstrable pulmonary embolus. There is no thoracic aortic aneurysm or dissection. There are foci of calcification in the aorta. There is moderate calcification in the proximal left subclavian artery. There is moderate calcification in the proximal right common carotid artery. Visualized great vessels otherwise appear unremarkable. There are  multiple foci of coronary artery calcification. Port-A-Cath tip is in the superior vena cava. There is a small pericardial effusion. The main pulmonary artery measures 3.4 cm in diameter, a finding suspicious for pulmonary arterial hypertension. Mediastinum/Nodes: Thyroid contains a benign-appearing calcification on the right measuring 7 x 6 mm. Thyroid otherwise appears unremarkable. There are multiple subcentimeter mediastinal lymph nodes. There is no adenopathy by size criteria, however. Lungs/Pleura: There is airspace consolidation in the left upper lobe with involvement of portions of the anterior and posterior segments of the left upper lobe. There is a moderate left pleural effusion. There is lower lobe interstitial edema bilaterally. Upper Abdomen: In the visualized upper abdomen, there is atherosclerotic calcification in the aorta. There is mild reflux of contrast into the inferior vena cava and hepatic veins. Visualized upper abdominal structures otherwise appear unremarkable. Musculoskeletal: There is diffuse edema throughout the left breast with skin thickening over the left breast. There are no blastic or lytic bone lesions. There are surgical clips in the left axillary region. Review of the MIP images confirms the above findings. IMPRESSION: No demonstrable pulmonary embolus. Suspect pulmonary arterial hypertension given prominence of the main pulmonary outflow tract. Multiple foci of arterial atherosclerosis. Foci of coronary artery calcification noted. Small pericardial effusion. There is airspace consolidation in the left upper lobe, likely due to pneumonia. There may be a degree of underlying radiation fibrosis. There is a moderate left pleural effusion. Interstitial edema in the lung bases. Question a degree of congestive heart failure versus possible lymphangitic spread of tumor in the lower lung zone regions. Both entities may exist concurrently. Edema in the left breast with skin thickening,  likely due to postoperative and radiation therapy change. Reflux of contrast into the inferior vena cava and hepatic veins may be indicative of a degree of increase in right heart pressure. Electronically Signed   By: Lowella Grip III M.D.   On: 12/10/2016 10:39        Scheduled Meds: . anastrozole  1 mg Oral Daily  . aspirin EC  81 mg Oral Daily  . azithromycin  500 mg Intravenous Q24H  . ceFEPime (MAXIPIME) IV  1 g Intravenous BID  . cycloSPORINE  1 drop Both Eyes BID  . enoxaparin (LOVENOX) injection  40 mg Subcutaneous QHS  . feeding supplement (ENSURE ENLIVE)  237 mL Oral BID BM  . felodipine  5 mg Oral Daily  . hydrALAZINE  50 mg Oral BID  . losartan  100 mg Oral Daily  . metoprolol succinate  12.5 mg Oral Daily  . omega-3 acid ethyl esters  2 g Oral Daily  . polyvinyl alcohol  1 drop Both Eyes BID  . vancomycin  750 mg Intravenous QHS   Continuous Infusions:   LOS: 0 days    Dron Tanna Furry, MD [facility] Pager [phone]  If 7PM-7AM, please contact night-coverage [email or web address] Password [facility] 12/10/2016, 3:31 PM

## 2016-12-10 NOTE — Progress Notes (Signed)
Initial Nutrition Assessment  DOCUMENTATION CODES:   Not applicable  INTERVENTION:   Ensure Enlive po BID, each supplement provides 350 kcal and 20 grams of protein  NUTRITION DIAGNOSIS:   Increased nutrient needs related to cancer and cancer related treatments as evidenced by increased estimated needs from calories and protein.   GOAL:   Patient will meet greater than or equal to 90% of their needs  MONITOR:   PO intake, Supplement acceptance  REASON FOR ASSESSMENT:   Malnutrition Screening Tool    ASSESSMENT:   82 y.o. female with history of breast cancer status post lumpectomy on anastrozole presents to the ER because of increasing shortness of breath and now with possible pneumonia.    Met with pt in room today. Pt reports good appetite and eating 100% meals. Pt reports that she had a decreased appetite back in Aug 2017 while she was going through radiation and lost ~15lbs but her appetite has been good ever since she finished treatments and her wt is stable. Pt reports intermittent constipation and takes stool softeners at home. No BM since admit.   Medications reviewed and include: aspirin, zithromax, cefepime, cyclosporine, lovenox, omega 3, vancomycin   Labs reviewed: Na 134(L), K 3.2(L), Cl 97(L), BNP 123(H)  Nutrition-Focused physical exam completed. Findings are no fat depletion, no muscle depletion, and no edema.   Diet Order:  Diet Heart Room service appropriate? Yes; Fluid consistency: Thin  Skin:  Reviewed, no issues  Last BM:  none since admit  Height:   Ht Readings from Last 1 Encounters:  12/09/16 5' 2"  (1.575 m)    Weight:   Wt Readings from Last 1 Encounters:  12/10/16 125 lb 10.6 oz (57 kg)    Ideal Body Weight:  50 kg  BMI:  Body mass index is 22.98 kg/m.  Estimated Nutritional Needs:   Kcal:  1700-1900kcal/day   Protein:  68-85g/day   Fluid:  >1.5L/day   EDUCATION NEEDS:   Education needs no appropriate at this  time  Koleen Distance, RD, LDN

## 2016-12-10 NOTE — Progress Notes (Signed)
Pt concerned about BP this am, refused am BP meds BP 129/36 59, will notify MD and await follow-up, SRP, RN

## 2016-12-10 NOTE — Progress Notes (Signed)
Pharmacy Antibiotic Note  Charlotte [name] is a 82 y.o. female admitted on 12/09/2016 with pneumonia.  Pharmacy has been consulted for Vancomycin dosing.  Plan: Vancomycin 750mg  IV every 24 hours.  Goal trough 15-20 mcg/mL.  Height: 5\' 2"  (157.5 cm) Weight: 125 lb 10.6 oz (57 kg) IBW/kg (Calculated) : 50.1  Temp (24hrs), Avg:97.8 F (36.6 C), Min:97.7 F (36.5 C), Max:97.9 F (36.6 C)   Recent Labs Lab 12/09/16 1620 12/09/16 2358  WBC 4.3 4.2  CREATININE 1.01* 0.94    Estimated Creatinine Clearance: 34 mL/min (by C-G formula based on SCr of 0.94 mg/dL).    Allergies  Allergen Reactions  . Latex Swelling    SEVERITY OF SWELLING NOT DEFINED.  Marland Kitchen Bextra [Valdecoxib] Other (See Comments)    Stomach Pains  . Motrin [Ibuprofen] Other (See Comments)    Stomach pain    Antimicrobials this admission: Vancomycin 12/09/2016 >> Cefepime 12/09/2016 >>   Dose adjustments this admission: -  Microbiology results: pending  Thank you for allowing pharmacy to be a part of this patient's care.  Nani Skillern Crowford 12/10/2016 4:23 AM

## 2016-12-11 ENCOUNTER — Encounter (HOSPITAL_COMMUNITY): Payer: Self-pay | Admitting: Pulmonary Disease

## 2016-12-11 ENCOUNTER — Ambulatory Visit: Payer: Self-pay | Admitting: Adult Health

## 2016-12-11 DIAGNOSIS — I272 Pulmonary hypertension, unspecified: Secondary | ICD-10-CM

## 2016-12-11 DIAGNOSIS — R918 Other nonspecific abnormal finding of lung field: Secondary | ICD-10-CM

## 2016-12-11 DIAGNOSIS — M1990 Unspecified osteoarthritis, unspecified site: Secondary | ICD-10-CM

## 2016-12-11 DIAGNOSIS — I5041 Acute combined systolic (congestive) and diastolic (congestive) heart failure: Secondary | ICD-10-CM

## 2016-12-11 DIAGNOSIS — J90 Pleural effusion, not elsewhere classified: Secondary | ICD-10-CM

## 2016-12-11 DIAGNOSIS — G4733 Obstructive sleep apnea (adult) (pediatric): Secondary | ICD-10-CM

## 2016-12-11 LAB — RESPIRATORY PANEL BY PCR
ADENOVIRUS-RVPPCR: NOT DETECTED
Bordetella pertussis: NOT DETECTED
CHLAMYDOPHILA PNEUMONIAE-RVPPCR: NOT DETECTED
CORONAVIRUS NL63-RVPPCR: NOT DETECTED
CORONAVIRUS OC43-RVPPCR: NOT DETECTED
Coronavirus 229E: NOT DETECTED
Coronavirus HKU1: NOT DETECTED
INFLUENZA A-RVPPCR: NOT DETECTED
Influenza B: NOT DETECTED
Metapneumovirus: NOT DETECTED
Mycoplasma pneumoniae: NOT DETECTED
PARAINFLUENZA VIRUS 3-RVPPCR: NOT DETECTED
PARAINFLUENZA VIRUS 4-RVPPCR: NOT DETECTED
Parainfluenza Virus 1: NOT DETECTED
Parainfluenza Virus 2: NOT DETECTED
RHINOVIRUS / ENTEROVIRUS - RVPPCR: NOT DETECTED
Respiratory Syncytial Virus: NOT DETECTED

## 2016-12-11 LAB — BASIC METABOLIC PANEL
Anion gap: 9 (ref 5–15)
BUN: 17 mg/dL (ref 6–20)
CALCIUM: 9.2 mg/dL (ref 8.9–10.3)
CO2: 27 mmol/L (ref 22–32)
CREATININE: 1 mg/dL (ref 0.44–1.00)
Chloride: 100 mmol/L — ABNORMAL LOW (ref 101–111)
GFR calc non Af Amer: 50 mL/min — ABNORMAL LOW (ref >60)
GFR, EST AFRICAN AMERICAN: 57 mL/min — AB (ref >60)
GLUCOSE: 109 mg/dL — AB (ref 65–99)
Potassium: 3.6 mmol/L (ref 3.5–5.1)
Sodium: 136 mmol/L (ref 135–145)

## 2016-12-11 LAB — TROPONIN I: Troponin I: 0.03 ng/mL (ref <0.03)

## 2016-12-11 MED ORDER — FUROSEMIDE 40 MG PO TABS
40.0000 mg | ORAL_TABLET | Freq: Every day | ORAL | Status: DC
  Administered 2016-12-11 – 2016-12-12 (×2): 40 mg via ORAL
  Filled 2016-12-11 (×2): qty 1

## 2016-12-11 MED ORDER — POTASSIUM CHLORIDE CRYS ER 20 MEQ PO TBCR
40.0000 meq | EXTENDED_RELEASE_TABLET | Freq: Once | ORAL | Status: AC
  Administered 2016-12-11: 40 meq via ORAL
  Filled 2016-12-11: qty 2

## 2016-12-11 MED ORDER — LEVOFLOXACIN 750 MG PO TABS: 750.0000 mg | ORAL_TABLET | ORAL | Status: DC

## 2016-12-11 MED ORDER — POTASSIUM CHLORIDE CRYS ER 20 MEQ PO TBCR
20.0000 meq | EXTENDED_RELEASE_TABLET | Freq: Every day | ORAL | Status: DC
  Administered 2016-12-12: 20 meq via ORAL
  Filled 2016-12-11 (×2): qty 1

## 2016-12-11 NOTE — Consult Note (Signed)
Name: Charlotte [name] MRN: [id number] DOB: Jan 19, 1928    ADMISSION DATE:  12/09/2016 CONSULTATION DATE:  12/11/2016  REFERRING MD :  Harlon Flor, M.D. / [facility]  CHIEF COMPLAINT:  Dyspnea  BRIEF PATIENT DESCRIPTION: 82 y.o. female with history of breast cancer post lumpectomy on anastrozole and also previously treated with Herceptin with evidence of combined systolic and diastolic congestive heart failure on echocardiogram presents with dyspnea. Echocardiogram also suggests pulmonary hypertension. Followed outpatient by cardiology. PCCM asked to consult regarding pulmonary hypertension & the possibility of underlying lung disease contributing to patient's symptoms.   SIGNIFICANT EVENTS  06/17/16 - Left breast lumptectomy 7/28 - 08/14/16 - XRT to Left Breast 07/2016 - Started Herceptin & Anastrozole after XRT 11/24/16 - Stopped Herceptin with decreased EF 12/20 - Admit  STUDIES:  ABG on RA (11/04/15):  7.649 / 22.8 / 117 / 99% TTE (11/17/16): LV normal in size with moderate LVH. EF 50%. Grade 2 diastolic dysfunction. LA moderately dilated & RA normal in size. RV normal in size and function. Pulmonary artery systolic pressure 64 mmHg. Trivial aortic regurgitation. Aortic root normal in size. Mild mitral regurgitation. No patent foreman ovale identified. Trivial pulmonic regurgitation. Mild tricuspid regurgitation. No pericardial effusion. Previous ejection fraction 60-65% in July 2017 with grade 1 diastolic dysfunction, preserved right ventricular size/function, & pulmonary artery systolic pressure 51 mmHg. POLYSOMNOGRAM (11/25/16): Total AHI 16.1 events/hour with non-REM AHI 19.4 events/hour. Baseline saturation 96% with low saturation 75%. Patient spent 77 minutes with saturation less than 89%. Patient hasn't a total of 0 periodic limb movements. Patient had isolated PVCs on EKG but otherwise normal sinus rhythm. Interestingly patient had neither REM sleep nor supine positional sleep recorded. CTA  CHEST (12/10/16):  Personally reviewed by me. Small pericardial effusion. Main pulmonary artery 3.4 cm in diameter. Port-A-Cath noted. Calcification of the carotid, subclavian, and other arteries noted. Poorly visualized subcarinal lymph node appears to measure approximately 1.1 cm in short axis. No other pathologic mediastinal adenopathy. No pulmonary embolism. Wedge-shaped consolidation with air bronchograms within left upper lobe/lingula which was not present on June 2017 CT imaging. Reviewing previous chest x-ray imaging does suggest there may have been a developing consolidation in the same area starting in July 2017 but obviously more progressive with recent x-ray and CT imaging. Lower lung intralobular septal thickening that could represent pulmonary edema. This was minimal if at all present on previous CT imaging in June 2017. Small to moderate free-flowing left pleural effusion.  MICROBIOLOGY: Blood Ctx x2 12/20 >> Urine Streptococcal Ag 12/21:  Negative  Respiratory Viral Panel PCR 12/21:  Negative   HISTORY OF PRESENT ILLNESS:  82 y.o. female who underwent left breast lumpectomy with axillary lymph node biopsy in June 2017. Patient also previously underwent right wrist lumpectomy. Patient has a known history of hypertension, insomnia, and hyperthyroidism as well as arthritis. Patient initially was started on Herceptin by her oncologist which was subsequently stopped due to decline in ejection fraction during office visit on 11/24/16. Patient underwent radiation therapy in August of this year for her left breast cancer. Patient has noticed increasing dyspnea since August/September which progressively worsened with significant fatigue prompting her presentation to the emergency department on 12/20. Patient reports since admission she has had frequent urination as hospitalist have been using Lasix for diuresis. Patient denies any paroxysmal nocturnal dyspnea or lower extremity edema. She does report a  mild, intermittent cough that is nonproductive. She denies any wheezing. She denies any chest pain, pressure, or tightness. She  reports her dyspnea was primarily with exertion. She denies any syncopal or near syncope. She denies any subjective fever, chills, or sweats. She denies any abdominal pain, nausea, or emesis. She denies any reflux or dyspepsia. She denies any dysphagia. She denies any headache. She denies any focal weakness, numbness, tingling, or vision loss. Patient was empirically started on antibiotics for possible pneumonia on admission. Patient denies any dry mouth or oral ulcers. She does endorse dry eyes. Denies any Raynaud's phenomenon. Denies any prior history of diet medication/drug use.  PAST MEDICAL HISTORY:  Past Medical History:  Diagnosis Date  . Anxiety   . Arthritis   . Cancer [facility])    breast cancer  . Fatigue   . History of hyperthyroidism    resolved, no medicaions for treatment at this time  . Hypertension   . Insomnia   . OSA (obstructive sleep apnea)     PAST SURGICAL HISTORY: Past Surgical History:  Procedure Laterality Date  . BREAST LUMPECTOMY     right side  . BREAST LUMPECTOMY WITH AXILLARY LYMPH NODE BIOPSY Left 06/17/2016   Procedure: BREAST LUMPECTOMY WITH AXILLARY LYMPH NODE BIOPSY;  Surgeon: Autumn Messing III, MD;  Location: [facility];  Service: General;  Laterality: Left;  . COLONOSCOPY    . DILATION AND CURETTAGE OF UTERUS    . EYE SURGERY     bilateral cataracts removed  . FOOT SURGERY    . PORTACATH PLACEMENT Right 07/16/2016   Procedure: INSERTION PORT-A-CATH RIGHT SUBLCLAVIAN;  Surgeon: Autumn Messing III, MD;  Location: [facility];  Service: General;  Laterality: Right;  . TONSILLECTOMY      Prior to Admission medications   Medication Sig Start Date End Date Taking? Authorizing Provider  anastrozole (ARIMIDEX) 1 MG tablet Take 1 tablet (1 mg total) by mouth daily. 10/13/16  Yes Nicholas Lose, MD  aspirin EC 81 MG tablet Take 81 mg by mouth daily.   Yes  Historical Provider, MD  Calcium Carbonate-Vitamin D (CALCIUM 600+D3 PO) Take 1 tablet by mouth 2 (two) times daily.   Yes Historical Provider, MD  carbamide peroxide (DEBROX) 6.5 % otic solution Place 1 drop into both ears daily as needed (ear wax buildup).   Yes Historical Provider, MD  cycloSPORINE (RESTASIS) 0.05 % ophthalmic emulsion Place 1 drop into both eyes 2 (two) times daily.   Yes Historical Provider, MD  felodipine (PLENDIL) 5 MG 24 hr tablet Take 5 mg by mouth daily.    Yes Historical Provider, MD  fluticasone (FLONASE) 50 MCG/ACT nasal spray Place 2 sprays into the nose daily as needed for allergies.   Yes Historical Provider, MD  hydrALAZINE (APRESOLINE) 50 MG tablet Take 1 tablet (50 mg total) by mouth 2 (two) times daily. 06/01/16  Yes Lendon Colonel, NP  Liniments [facility] PAIN RELIEF PATCH) PADS Apply 1 each topically daily as needed (pain).   Yes Historical Provider, MD  LORazepam (ATIVAN) 1 MG tablet Take 0.5 mg by mouth every 8 (eight) hours as needed for anxiety or sleep.    Yes Historical Provider, MD  losartan (COZAAR) 100 MG tablet Take 100 mg by mouth daily.   Yes Historical Provider, MD  metoprolol succinate (TOPROL XL) 25 MG 24 hr tablet Take 0.5 tablets (12.5 mg total) by mouth daily. 12/09/16  Yes Arnoldo Lenis, MD  Multiple Vitamins-Minerals (CENTRUM ADULTS PO) Take 1 tablet by mouth daily.   Yes Historical Provider, MD  non-metallic deodorant Jethro Poling) MISC Apply 1 application topically daily as needed.  Yes Historical Provider, MD  Omega-3 Fatty Acids (FISH OIL TRIPLE STRENGTH) 1400 MG CAPS Take 1 tablet by mouth daily.   Yes Historical Provider, MD  Polyethyl Glycol-Propyl Glycol (SYSTANE) 0.4-0.3 % SOLN Apply 1 drop to eye 2 (two) times daily.   Yes Historical Provider, MD  tretinoin (RETIN-A) 0.025 % cream Apply 1 application topically at bedtime.  06/27/15  Yes Historical Provider, MD   Allergies  Allergen Reactions  . Latex Swelling    SEVERITY OF  SWELLING NOT DEFINED.  Marland Kitchen Bextra [Valdecoxib] Other (See Comments)    Stomach Pains  . Motrin [Ibuprofen] Other (See Comments)    Stomach pain    FAMILY HISTORY:  Family History  Problem Relation Age of Onset  . Heart attack Mother   . Cancer Mother   . Hypertension Other   . Lung disease Neg Hx   . Rheumatologic disease Neg Hx    SOCIAL HISTORY: Social History   Social History  . Marital status: Married    Spouse name: N/A  . Number of children: N/A  . Years of education: N/A   Social History Main Topics  . Smoking status: Passive Smoke Exposure - Never Smoker  . Smokeless tobacco: Never Used     Comment: Husband smoked  . Alcohol use No  . Drug use: No  . Sexual activity: Not Currently   Other Topics Concern  . 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 Originally from [place]. Lived for years in [place]. Moved back to [place] in 1991. Has also lived in [place]. No pets currently. No bird or mold exposure.    REVIEW OF SYSTEMS:  Patient denies any rashes or abnormal bruising. She denies any joint swelling or erythema. She does report some mild pain/stiffness particularly in her right hand that is chronic. A pertinent 14 point review of systems is negative except as per the history of presenting illness.  SUBJECTIVE:   VITAL SIGNS: Temp:  [97.8 F (36.6 C)-98.3 F (36.8 C)] 98.3 F (36.8 C) (12/22 1350) Pulse Rate:  [63-78] 78 (12/22 1350) Resp:  [16-18] 16 (12/22 1350) BP: (131-154)/(51-56) 131/51 (12/22 1350) SpO2:  [95 %-98 %] 98 % (12/22 1350) Weight:  [131 lb 9.8 oz (59.7 kg)] 131 lb 9.8 oz (59.7 kg) (12/22 0537)  PHYSICAL EXAMINATION: General:  Awake. Alert. No acute distress. Husband & son at bedside.  Integument:  Warm & dry. No rash on exposed skin. Bruising in left antecubital fossa from previous  phlebotomy. Extremities:  No cyanosis or clubbing.  Lymphatics:  No appreciated cervical or supraclavicular lymphadenoapthy. HEENT:  Moist mucus membranes. No oral ulcers. No scleral injection or icterus.  Cardiovascular:  Regular rate and rhythm. No edema. No appreciable JVD.  Pulmonary:  Crackles in the right lung base with diminished breath sounds left lung base. Mild dullness to percussion of the left lung base. Symmetric chest wall expansion. No accessory muscle use on room air. Abdomen: Soft. Normal bowel sounds. Nondistended. Grossly nontender. No hepatosplenomegaly or hepatojugular reflux. Musculoskeletal:  Normal bulk and tone. Hand grip strength 5/5 bilaterally. No joint deformity or effusion appreciated. Neurological:  CN 2-12 grossly in tact. No meningismus. Moving all 4 extremities equally. Symmetric brachioradialis deep tendon reflexes. Psychiatric:  Mood and affect congruent. Speech normal rhythm, rate & tone.    Recent Labs Lab 12/09/16 1620 12/09/16 2358 12/10/16 0524 12/11/16 8177  NA 133*  --  134* 136  K 3.4*  --  3.2* 3.6  CL 97*  --  97* 100*  CO2 28  --  30 27  BUN 16  --  15 17  CREATININE 1.01* 0.94 0.98 1.00  GLUCOSE 105*  --  93 109*    Recent Labs Lab 12/09/16 1620 12/09/16 2358 12/10/16 0524  HGB 11.9* 11.5* 11.1*  HCT 35.2* 34.2* 33.2*  WBC 4.3 4.2 4.8  PLT 307 294 315   Dg Chest 2 View  Result Date: 12/09/2016 CLINICAL DATA:  Weakness, shortness of breath x2 weeks, recently diagnosed breast cancer EXAM: CHEST  2 VIEW COMPARISON:  07/16/2016 FINDINGS: Left upper lobe opacity, new, suspicious for pneumonia. Radiation changes are also possible in the appropriate clinical setting. Mild patchy opacities in the bilateral lower lobe opacities, atelectasis versus pneumonia. No pleural effusion or pneumothorax. Mild cardiomegaly. Right chest power port terminates at the cavoatrial junction. Surgical clips in the left axilla. IMPRESSION: Left upper lobe  opacity, new, suspicious for pneumonia. Radiation changes are also possible in the appropriate clinical setting. Mild patchy opacities in the bilateral lower lobes, atelectasis versus pneumonia. Follow-up chest radiographs are suggested in 4-6 weeks to document clearance. Electronically Signed   By: Julian Hy M.D.   On: 12/09/2016 17:10   Ct Angio Chest Pe W Or Wo Contrast  Result Date: 12/10/2016 CLINICAL DATA:  Shortness of breath.  History breast carcinoma EXAM: CT ANGIOGRAPHY CHEST WITH CONTRAST TECHNIQUE: Multidetector CT imaging of the chest was performed using the standard protocol during bolus administration of intravenous contrast. Multiplanar CT image reconstructions and MIPs were obtained to evaluate the vascular anatomy. CONTRAST:  100 mL Isovue 370 nonionic COMPARISON:  Chest CT May 25, 2016; chest radiograph December 09, 2016 FINDINGS: Cardiovascular: There is no demonstrable pulmonary embolus. There is no thoracic aortic aneurysm or dissection. There are foci of calcification in the aorta. There is moderate calcification in the proximal left subclavian artery. There is moderate calcification in the proximal right common carotid artery. Visualized great vessels otherwise appear unremarkable. There are multiple foci of coronary artery calcification. Port-A-Cath tip is in the superior vena cava. There is a small pericardial effusion. The main pulmonary artery measures 3.4 cm in diameter, a finding suspicious for pulmonary arterial hypertension. Mediastinum/Nodes: Thyroid contains a benign-appearing calcification on the right measuring 7 x 6 mm. Thyroid otherwise appears unremarkable. There are multiple subcentimeter mediastinal lymph nodes. There is no adenopathy by size criteria, however. Lungs/Pleura: There is airspace consolidation in the left upper lobe with involvement of portions of the anterior and posterior segments of the left upper lobe. There is a moderate left pleural effusion.  There is lower lobe interstitial edema bilaterally. Upper Abdomen: In the visualized upper abdomen, there is atherosclerotic calcification in the aorta. There is mild reflux of contrast into the inferior vena cava and hepatic veins. Visualized upper abdominal structures otherwise appear unremarkable. Musculoskeletal: There is diffuse edema throughout the left breast with skin thickening over the left breast. There are no blastic or lytic bone lesions. There are surgical clips in the left axillary region. Review of the MIP images confirms the above findings. IMPRESSION: No demonstrable pulmonary embolus. Suspect pulmonary arterial hypertension given prominence of the main pulmonary outflow tract. Multiple foci of arterial atherosclerosis. Foci of coronary artery calcification noted. Small pericardial effusion. There is airspace consolidation in the left upper lobe, likely due to pneumonia. There may be a degree of underlying radiation fibrosis. There is  a moderate left pleural effusion. Interstitial edema in the lung bases. Question a degree of congestive heart failure versus possible lymphangitic spread of tumor in the lower lung zone regions. Both entities may exist concurrently. Edema in the left breast with skin thickening, likely due to postoperative and radiation therapy change. Reflux of contrast into the inferior vena cava and hepatic veins may be indicative of a degree of increase in right heart pressure. Electronically Signed   By: Lowella Grip III M.D.   On: 12/10/2016 10:39    ASSESSMENT / PLAN:  82 y.o. female with a complicated medical history including left breast cancer status post radiation therapy in August 2017 and subsequent treatment with Herceptin as well as anastrozole. Patient's dyspnea seems to be centered around the time of her radiation therapy and initiation of her medication regimen for treatment of her breast cancer. Reviewing her previous imaging does indicate there may have  been some left upper lung opacification on x-ray in July which would precede her radiation therapy. Certainly we are in the time window for radiation pneumonitis causing the opacification/findings particularly in her left upper lobe/lingula. Symptomatically she is improving with diuresis and when taken in conjunction with her decline in ejection fraction, left ventricular hypertrophy, and grade 2 diastolic dysfunction I feel that combined systolic and diastolic congestive heart failure exacerbation is likely etiology for her symptoms on admission. I do not believe the patient has pneumonia or an exacerbation of underlying COPD therefore I am discontinuing antibiotics and nebulizer therapies. I'm also holding off on pulmonary function testing at this time given her clinical picture. Pulmonary function testing can be obtained as an outpatient once the patient stabilizes/recovers. With regards to her pulmonary hypertension this too is likely multifactorial in etiology given her underlying sleep apnea as well as her left ventricular dysfunction. There may be some underlying pulmonary parenchymal process contributing but until the patient has been completely diuresed and is euvolemic I would not recommend high-resolution CT imaging or left/right heart catheterization. I have discussed the patient's symptoms and pathophysiology of her multiple medical problems at length with both she and her family present.  1. Lung Opacity on Imaging Study:  Seen in left upper lobe/lingula. Unclear etiology. Plan to repeat CXR PA/LAT at follow-up appointment on 1/3. If patient wishes to be aggressive may need bronchoscopy with biopsy to prove this is not progression of her malignancy rather than radiation fibrosis.  2. OSA: Patient scheduled for outpatient CPAP titration. 3. Possible Pulmonary Hypertension: Suggested on echocardiogram. Likely multifactorial. Recommend holding off on pulmonary arterial vasodilator therapy as well as  left/right heart catheterization at this time. Holding off on pulmonary function testing at this time. Checking SCL-70 with AM 12/23 labs. Recommend ambulatory oxygen saturation prior to discharge from hospital. 4. Left Pleural Effusion: Etiology could be malignancy versus congestive heart failure. Recommend repeat chest x-ray imaging PA/LAT prior to discharge. Recommend continued diuresis. I would only recommend thoracentesis if effusion was not resolving with diuresis or enlarging in spite of.  5. Combined Systolic & Diastolic Congestive Heart Failure Exacerbation:  Cardiology consulted & following. Currently on Lasix PO daily, ASA '81mg'$ , Toprol XL daily, Losartan daily, & Hydralazine BID. Recommend continued diuresis as long as renal function and blood pressure allow. 6. Arthritis:  Checking ESR, CRP, ANA, Anti-CCP & Rheumatoid Factor with AM 12/23 labs. 7. Follow-up:  Patient has been scheduled a follow-up appointment in our office on 1/3 at 4:30 PM with Eric Form, NP. I have given her our  contact information.   Remainder of care as per primary service. Please call the on-call physician over the weekend if there are questions or further concerns.   Sonia Baller Ashok Cordia, M.D. [facility] Pager:  [phone] After 3pm or if no response, call [phone] 12/11/2016, 4:38 PM

## 2016-12-11 NOTE — Progress Notes (Addendum)
PROGRESS NOTE    Charlotte [name]  [id number] DOB: 07/06/1928 DOA: 12/09/2016 PCP: Rosita Fire, MD   Brief Narrative: 82 y.o. female with history of breast cancer status post lumpectomy on anastrozole presents to the ER because of increasing shortness of breath. Patient over last 1 week as been having exertional shortness of breath. Denies any chest pain fever chills productive cough. Patient was initially started on Herceptin by patient's oncologist for the breast cancer, which was stopped recently after patient's 2-D echo showed a decreasing EF. 2-D echo done on 11/17/2016 showed EF of 50% with elevated pulmonary artery pressure and grade 2 diastolic dysfunction.Chest x-ray done in the ER shows possible pneumonia and was started on antibiotics.  Assessment & Plan:  # Shortness of breath and dyspnea on exertion possibly due to left upper lobe pneumonia and pulmonary hypertension: -CT scan of chest is concerning for left upper lobe pneumonia. No evidence of pulmonary embolism. There is also moderate left pleural effusion. -Discontinue cefepime, azithromycin and vancomycin. Switch antibiotics to oral Levaquin. Follow up culture results. -I think that patient's symptoms are mostly related with pulmonary hypertension and underlying chronic lung disease. I reviewed echo from November this year. I consulted cardiologist for further evaluation. Cardiologist recommended pulmonary consult, PFT and started lasix. Pulmonary function test ordered by card. ? cath -I discussed with Dr. Ashok Cordia from pulmonary for the consult. I reviewed the case with him. -Patient is not hypoxic. Maintaining oxygen saturation on room air. -Respiratory viral studies negative. Discontinue droplet precaution.  #Severe pulmonary hypertension: Currently on felodipine. Further evaluation and management as per cardiologist. Evaluated by cardiologist today, her primary consult and possible cardiac cath to further  evaluate.  #Hypertension: Resume home medication. Monitor blood pressure.  #History of breast cancer status post radiation treatment, and follow up with oncologist.  #Recently diagnosed with sleep apnea undergoing evaluation for CPAP.  #Hypokalemia: Potassium level improving. Monitor labs.   Principal Problem:   Acute respiratory failure with hypoxia (HCC) Active Problems:   Generalized anxiety disorder   Malignant neoplasm of upper-inner quadrant of left female breast ([place])   Community acquired pneumonia   Essential hypertension   SOB (shortness of breath)   Acute respiratory failure (HCC)  PT, OT evaluation. DVT prophylaxis: Lovenox subcutaneous Code Status: Full code Family Communication: No family present at bedside. Disposition Plan: Likely discharge home in 1-2 days.  Consultants:   Cardiology  Pulmonology  Antimicrobial: Discontinued vancomycin, cefepime and azithromycin. Started Levaquin on December 22.  Subjective: Patient was seen and examined at bedside. Continues to have baseline shortness of breath. Denied fever, chills, chest pain, nausea vomiting or abdominal pain. No cough. Objective: Vitals:   12/11/16 0537 12/11/16 1323 12/11/16 1326 12/11/16 1350  BP: (!) 154/51   (!) 131/51  Pulse: 64   78  Resp: 17   16  Temp: 97.8 F (36.6 C)   98.3 F (36.8 C)  TempSrc: Oral   Oral  SpO2: 95% 95% 95% 98%  Weight: 59.7 kg (131 lb 9.8 oz)     Height:        Intake/Output Summary (Last 24 hours) at 12/11/16 1510 Last data filed at 12/11/16 1433  Gross per 24 hour  Intake              930 ml  Output             2300 ml  Net            -1370 ml   [employer]  12/09/16 2222 12/10/16 0446 12/11/16 0537  Weight: 57 kg (125 lb 10.6 oz) 57 kg (125 lb 10.6 oz) 59.7 kg (131 lb 9.8 oz)    Examination:  General exam: Not in distress, lying in bed, speaking full sentences Respiratory system: Bibasal decreased breath sound, respiratory effort normal, no  wheezing or crackles appreciated. Cardiovascular system: Regular rate rhythm, S1-S2 normal. No pedal edema. Gastrointestinal system: Abdomen is nondistended, soft and nontender. Normal bowel sounds heard. Central nervous system: Alert and oriented. No focal neurological deficits. Extremities: Symmetric 5 x 5 power. Skin: No rashes, lesions or ulcers Psychiatry: Judgement and insight appear normal. Mood & affect appropriate.     Data Reviewed: I have personally reviewed following labs and imaging studies  CBC:  Recent Labs Lab 12/09/16 1620 12/09/16 2358 12/10/16 0524  WBC 4.3 4.2 4.8  HGB 11.9* 11.5* 11.1*  HCT 35.2* 34.2* 33.2*  MCV 81.5 81.8 83.4  PLT 307 294 [id number]   Basic Metabolic Panel:  Recent Labs Lab 12/09/16 1620 12/09/16 2358 12/10/16 0524 12/11/16 0856  NA 133*  --  134* 136  K 3.4*  --  3.2* 3.6  CL 97*  --  97* 100*  CO2 28  --  30 27  GLUCOSE 105*  --  93 109*  BUN 16  --  15 17  CREATININE 1.01* 0.94 0.98 1.00  CALCIUM 9.6  --  9.0 9.2   GFR: Estimated Creatinine Clearance: 31.9 mL/min (by C-G formula based on SCr of 1 mg/dL). Liver Function Tests: No results for input(s): AST, ALT, ALKPHOS, BILITOT, PROT, ALBUMIN in the last 168 hours. No results for input(s): LIPASE, AMYLASE in the last 168 hours. No results for input(s): AMMONIA in the last 168 hours. Coagulation Profile: No results for input(s): INR, PROTIME in the last 168 hours. Cardiac Enzymes:  Recent Labs Lab 12/09/16 2358 12/11/16 0856  TROPONINI 0.03* 0.03*   BNP (last 3 results) No results for input(s): PROBNP in the last 8760 hours. HbA1C: No results for input(s): HGBA1C in the last 72 hours. CBG:  Recent Labs Lab 12/09/16 1614 12/10/16 2142  GLUCAP 112* 105*   Lipid Profile: No results for input(s): CHOL, HDL, LDLCALC, TRIG, CHOLHDL, LDLDIRECT in the last 72 hours. Thyroid Function Tests: No results for input(s): TSH, T4TOTAL, FREET4, T3FREE, THYROIDAB in the last 72  hours. Anemia Panel: No results for input(s): VITAMINB12, FOLATE, FERRITIN, TIBC, IRON, RETICCTPCT in the last 72 hours. Sepsis Labs: No results for input(s): PROCALCITON, LATICACIDVEN in the last 168 hours.  Recent Results (from the past 240 hour(s))  Culture, blood (Routine X 2) w Reflex to ID Panel     Status: None (Preliminary result)   Collection Time: 12/09/16 10:04 PM  Result Value Ref Range Status   Specimen Description LEFT ANTECUBITAL  Final   Special Requests IN PEDIATRIC BOTTLE 3CC  Final   Culture   Final    NO GROWTH 1 DAY Performed at [facility]    Report Status PENDING  Incomplete  Culture, blood (Routine X 2) w Reflex to ID Panel     Status: None (Preliminary result)   Collection Time: 12/09/16 10:04 PM  Result Value Ref Range Status   Specimen Description RIGHT ANTECUBITAL  Final   Special Requests BOTTLES DRAWN AEROBIC AND ANAEROBIC 5CC  Final   Culture   Final    NO GROWTH 1 DAY Performed at [facility]    Report Status PENDING  Incomplete  Respiratory Panel by PCR  Status: None   Collection Time: 12/10/16  3:30 PM  Result Value Ref Range Status   Adenovirus NOT DETECTED NOT DETECTED Final   Coronavirus 229E NOT DETECTED NOT DETECTED Final   Coronavirus HKU1 NOT DETECTED NOT DETECTED Final   Coronavirus NL63 NOT DETECTED NOT DETECTED Final   Coronavirus OC43 NOT DETECTED NOT DETECTED Final   Metapneumovirus NOT DETECTED NOT DETECTED Final   Rhinovirus / Enterovirus NOT DETECTED NOT DETECTED Final   Influenza A NOT DETECTED NOT DETECTED Final   Influenza B NOT DETECTED NOT DETECTED Final   Parainfluenza Virus 1 NOT DETECTED NOT DETECTED Final   Parainfluenza Virus 2 NOT DETECTED NOT DETECTED Final   Parainfluenza Virus 3 NOT DETECTED NOT DETECTED Final   Parainfluenza Virus 4 NOT DETECTED NOT DETECTED Final   Respiratory Syncytial Virus NOT DETECTED NOT DETECTED Final   Bordetella pertussis NOT DETECTED NOT DETECTED Final    Chlamydophila pneumoniae NOT DETECTED NOT DETECTED Final   Mycoplasma pneumoniae NOT DETECTED NOT DETECTED Final    Comment: Performed at [facility]         Radiology Studies: Dg Chest 2 View  Result Date: 12/09/2016 CLINICAL DATA:  Weakness, shortness of breath x2 weeks, recently diagnosed breast cancer EXAM: CHEST  2 VIEW COMPARISON:  07/16/2016 FINDINGS: Left upper lobe opacity, new, suspicious for pneumonia. Radiation changes are also possible in the appropriate clinical setting. Mild patchy opacities in the bilateral lower lobe opacities, atelectasis versus pneumonia. No pleural effusion or pneumothorax. Mild cardiomegaly. Right chest power port terminates at the cavoatrial junction. Surgical clips in the left axilla. IMPRESSION: Left upper lobe opacity, new, suspicious for pneumonia. Radiation changes are also possible in the appropriate clinical setting. Mild patchy opacities in the bilateral lower lobes, atelectasis versus pneumonia. Follow-up chest radiographs are suggested in 4-6 weeks to document clearance. Electronically Signed   By: Julian Hy M.D.   On: 12/09/2016 17:10   Ct Angio Chest Pe W Or Wo Contrast  Result Date: 12/10/2016 CLINICAL DATA:  Shortness of breath.  History breast carcinoma EXAM: CT ANGIOGRAPHY CHEST WITH CONTRAST TECHNIQUE: Multidetector CT imaging of the chest was performed using the standard protocol during bolus administration of intravenous contrast. Multiplanar CT image reconstructions and MIPs were obtained to evaluate the vascular anatomy. CONTRAST:  100 mL Isovue 370 nonionic COMPARISON:  Chest CT May 25, 2016; chest radiograph December 09, 2016 FINDINGS: Cardiovascular: There is no demonstrable pulmonary embolus. There is no thoracic aortic aneurysm or dissection. There are foci of calcification in the aorta. There is moderate calcification in the proximal left subclavian artery. There is moderate calcification in the proximal right common  carotid artery. Visualized great vessels otherwise appear unremarkable. There are multiple foci of coronary artery calcification. Port-A-Cath tip is in the superior vena cava. There is a small pericardial effusion. The main pulmonary artery measures 3.4 cm in diameter, a finding suspicious for pulmonary arterial hypertension. Mediastinum/Nodes: Thyroid contains a benign-appearing calcification on the right measuring 7 x 6 mm. Thyroid otherwise appears unremarkable. There are multiple subcentimeter mediastinal lymph nodes. There is no adenopathy by size criteria, however. Lungs/Pleura: There is airspace consolidation in the left upper lobe with involvement of portions of the anterior and posterior segments of the left upper lobe. There is a moderate left pleural effusion. There is lower lobe interstitial edema bilaterally. Upper Abdomen: In the visualized upper abdomen, there is atherosclerotic calcification in the aorta. There is mild reflux of contrast into the inferior vena cava and hepatic  veins. Visualized upper abdominal structures otherwise appear unremarkable. Musculoskeletal: There is diffuse edema throughout the left breast with skin thickening over the left breast. There are no blastic or lytic bone lesions. There are surgical clips in the left axillary region. Review of the MIP images confirms the above findings. IMPRESSION: No demonstrable pulmonary embolus. Suspect pulmonary arterial hypertension given prominence of the main pulmonary outflow tract. Multiple foci of arterial atherosclerosis. Foci of coronary artery calcification noted. Small pericardial effusion. There is airspace consolidation in the left upper lobe, likely due to pneumonia. There may be a degree of underlying radiation fibrosis. There is a moderate left pleural effusion. Interstitial edema in the lung bases. Question a degree of congestive heart failure versus possible lymphangitic spread of tumor in the lower lung zone regions. Both  entities may exist concurrently. Edema in the left breast with skin thickening, likely due to postoperative and radiation therapy change. Reflux of contrast into the inferior vena cava and hepatic veins may be indicative of a degree of increase in right heart pressure. Electronically Signed   By: Lowella Grip III M.D.   On: 12/10/2016 10:39        Scheduled Meds: . anastrozole  1 mg Oral Daily  . aspirin EC  81 mg Oral Daily  . azithromycin  500 mg Intravenous Q24H  . ceFEPime (MAXIPIME) IV  1 g Intravenous BID  . cycloSPORINE  1 drop Both Eyes BID  . enoxaparin (LOVENOX) injection  40 mg Subcutaneous QHS  . feeding supplement (ENSURE ENLIVE)  237 mL Oral BID BM  . felodipine  5 mg Oral Daily  . furosemide  40 mg Oral Daily  . hydrALAZINE  50 mg Oral BID  . ipratropium-albuterol  3 mL Nebulization TID  . losartan  100 mg Oral Daily  . metoprolol succinate  12.5 mg Oral Daily  . omega-3 acid ethyl esters  2 g Oral Daily  . polyvinyl alcohol  1 drop Both Eyes BID  . [START ON 12/12/2016] potassium chloride  20 mEq Oral Daily   Continuous Infusions:   LOS: 1 day    Sundeep Destin Tanna Furry, MD [facility] Pager [phone]  If 7PM-7AM, please contact night-coverage [email or web address] Password [email or web address] 12/11/2016, 3:10 PM

## 2016-12-11 NOTE — Progress Notes (Signed)
Spoke with RT Justin-concerning PFT study, pt is unable to complete study due to pending RSV paneling.  Dr. Cathie Olden paged to notify him of the update, paged twice waiting on return call. Dr. Carolin Sicks notified as well, not new orders--waiting on RSV panel results per MD order.

## 2016-12-11 NOTE — Progress Notes (Signed)
Pharmacy Antibiotic Note  Charlotte [name] is a 82 y.o. female admitted on 12/09/2016 with CAP.  Patient started on Vanco/Cefepime, narrowed as below. To switch to oral Levaquin today.   Plan:  Levaquin 750 mg PO q48 hr for CrCl 20-50 ml/min  Give Levaquin 2 hrs apart from feeding supplements, vitamins, iron, or dairy products.  Height: 5\' 2"  (157.5 cm) Weight: 131 lb 9.8 oz (59.7 kg) IBW/kg (Calculated) : 50.1  Temp (24hrs), Avg:98.1 F (36.7 C), Min:97.8 F (36.6 C), Max:98.3 F (36.8 C)   Recent Labs Lab 12/09/16 1620 12/09/16 2358 12/10/16 0524 12/11/16 0856  WBC 4.3 4.2 4.8  --   CREATININE 1.01* 0.94 0.98 1.00    Estimated Creatinine Clearance: 31.9 mL/min (by C-G formula based on SCr of 1 mg/dL).    Allergies  Allergen Reactions  . Latex Swelling    SEVERITY OF SWELLING NOT DEFINED.  Marland Kitchen Bextra [Valdecoxib] Other (See Comments)    Stomach Pains  . Motrin [Ibuprofen] Other (See Comments)    Stomach pain    Antimicrobials this admission:  Vancomycin 12/09/2016 >> 12/22 Cefepime 12/09/2016 >> 12/22 Zithromax 12/20>> 12/11 Levaquin 12/22 >>  Dose adjustments this admission:  ---  Microbiology results:  12/20 BCx: [id number] 12/21 Resp panel: neg   Thank you for allowing pharmacy to be a part of this patient's care.  Reuel Boom, PharmD, BCPS Pager: [phone] 12/11/2016, 3:39 PM

## 2016-12-11 NOTE — Consult Note (Signed)
CONSULT NOTE  Date: 12/11/2016               Patient Name:  Charlotte [name] MRN: [id number]  DOB: Sep 24, 1928 Age / Sex: 82 y.o., female        PCP: [facility] Primary Cardiologist: Harl Bowie             Referring Physician: Carolin Sicks              Reason for Consult: Pulmonary artery hypertension           History of Present Illness: Patient is a 82 y.o. female with a PMHx of Breast cancer, obstructive sleep apnea and pulmonary hypertension who was admitted to [facility] on 12/09/2016 for evaluation of worsening shortness of breath..   The patient has had a Dry cough for the past week or so.  She denies any fever. She has been receiving Herceptin for breast cancer. Recent echocardiogram performed on November 28 reveals low normal left ventricle systolic function with an ejection fraction of 50%. The ejection fraction was 46% by speckle tracing. The patient has grade 2 diastolic dysfunction with a global longitudinal strain of -13.8%. The estimated pulmonary artery pressure 64 mmHg which is moderate to severe pulmonary hypertension.  She was recently diagnosed with obstructive sleep apnea. She has not been back for her second visit so has not had a trial of CPAP yet .      Medications: Outpatient medications: Prescriptions Prior to Admission  Medication Sig Dispense Refill Last Dose  . anastrozole (ARIMIDEX) 1 MG tablet Take 1 tablet (1 mg total) by mouth daily. 90 tablet 3 12/09/2016 at Unknown time  . aspirin EC 81 MG tablet Take 81 mg by mouth daily.   12/09/2016 at Unknown time  . Calcium Carbonate-Vitamin D (CALCIUM 600+D3 PO) Take 1 tablet by mouth 2 (two) times daily.   12/09/2016 at Unknown time  . carbamide peroxide (DEBROX) 6.5 % otic solution Place 1 drop into both ears daily as needed (ear wax buildup).   Past Week at Unknown time  . cycloSPORINE (RESTASIS) 0.05 % ophthalmic emulsion Place 1 drop into both eyes 2 (two) times daily.   12/09/2016 at Unknown time  .  felodipine (PLENDIL) 5 MG 24 hr tablet Take 5 mg by mouth daily.    12/09/2016 at Unknown time  . fluticasone (FLONASE) 50 MCG/ACT nasal spray Place 2 sprays into the nose daily as needed for allergies.   Past Month at Unknown time  . hydrALAZINE (APRESOLINE) 50 MG tablet Take 1 tablet (50 mg total) by mouth 2 (two) times daily. 180 tablet 3 12/09/2016 at Unknown time  . Liniments (SALONPAS PAIN RELIEF PATCH) PADS Apply 1 each topically daily as needed (pain).   Past Month at Unknown time  . LORazepam (ATIVAN) 1 MG tablet Take 0.5 mg by mouth every 8 (eight) hours as needed for anxiety or sleep.    12/09/2016 at Unknown time  . losartan (COZAAR) 100 MG tablet Take 100 mg by mouth daily.   12/09/2016 at Unknown time  . metoprolol succinate (TOPROL XL) 25 MG 24 hr tablet Take 0.5 tablets (12.5 mg total) by mouth daily. 30 tablet 3 12/09/2016 at 1pm  . Multiple Vitamins-Minerals (CENTRUM ADULTS PO) Take 1 tablet by mouth daily.   12/09/2016 at Unknown time  . non-metallic deodorant (ALRA) MISC Apply 1 application topically daily as needed.   12/09/2016 at Unknown time  . Omega-3 Fatty Acids (FISH OIL TRIPLE STRENGTH) 1400 MG  CAPS Take 1 tablet by mouth daily.   12/09/2016 at Unknown time  . Polyethyl Glycol-Propyl Glycol (SYSTANE) 0.4-0.3 % SOLN Apply 1 drop to eye 2 (two) times daily.   12/09/2016 at Unknown time  . tretinoin (RETIN-A) 0.025 % cream Apply 1 application topically at bedtime.   3 12/08/2016 at Unknown time    Current medications: Current Facility-Administered Medications  Medication Dose Route Frequency Provider Last Rate Last Dose  . acetaminophen (TYLENOL) tablet 650 mg  650 mg Oral Q6H PRN Rise Patience, MD       Or  . acetaminophen (TYLENOL) suppository 650 mg  650 mg Rectal Q6H PRN Rise Patience, MD      . anastrozole (ARIMIDEX) tablet 1 mg  1 mg Oral Daily Rise Patience, MD   Stopped at 12/10/16 1117  . aspirin EC tablet 81 mg  81 mg Oral Daily Rise Patience, MD   81 mg at 12/10/16 1119  . azithromycin (ZITHROMAX) 500 mg in dextrose 5 % 250 mL IVPB  500 mg Intravenous Q24H Lacretia Leigh, MD 250 mL/hr at 12/10/16 1951 500 mg at 12/10/16 1951  . ceFEPIme (MAXIPIME) 1 g in dextrose 5 % 50 mL IVPB  1 g Intravenous BID Rise Patience, MD   1 g at 12/10/16 2202  . cycloSPORINE (RESTASIS) 0.05 % ophthalmic emulsion 1 drop  1 drop Both Eyes BID Rise Patience, MD   1 drop at 12/10/16 1950  . enoxaparin (LOVENOX) injection 40 mg  40 mg Subcutaneous QHS Rise Patience, MD   40 mg at 12/10/16 2106  . feeding supplement (ENSURE ENLIVE) (ENSURE ENLIVE) liquid 237 mL  237 mL Oral BID BM Rise Patience, MD   237 mL at 12/10/16 1118  . felodipine (PLENDIL) 24 hr tablet 5 mg  5 mg Oral Daily Rise Patience, MD      . hydrALAZINE (APRESOLINE) injection 10 mg  10 mg Intravenous Q4H PRN Rise Patience, MD      . hydrALAZINE (APRESOLINE) tablet 50 mg  50 mg Oral BID Rise Patience, MD   50 mg at 12/10/16 2235  . ipratropium-albuterol (DUONEB) 0.5-2.5 (3) MG/3ML nebulizer solution 3 mL  3 mL Nebulization TID Dron Tanna Furry, MD   3 mL at 12/10/16 2035  . LORazepam (ATIVAN) tablet 0.5 mg  0.5 mg Oral Q8H PRN Rise Patience, MD   0.5 mg at 12/10/16 2202  . losartan (COZAAR) tablet 100 mg  100 mg Oral Daily Rise Patience, MD      . metoprolol succinate (TOPROL-XL) 24 hr tablet 12.5 mg  12.5 mg Oral Daily Rise Patience, MD      . omega-3 acid ethyl esters (LOVAZA) capsule 2 g  2 g Oral Daily Rise Patience, MD   2 g at 12/10/16 1120  . ondansetron (ZOFRAN) tablet 4 mg  4 mg Oral Q6H PRN Rise Patience, MD       Or  . ondansetron [facility]) injection 4 mg  4 mg Intravenous Q6H PRN Rise Patience, MD      . polyvinyl alcohol (LIQUIFILM TEARS) 1.4 % ophthalmic solution 1 drop  1 drop Both Eyes BID Rise Patience, MD   1 drop at 12/10/16 2107  . sodium chloride flush (NS) 0.9 % injection 10-40 mL   10-40 mL Intracatheter PRN Rise Patience, MD   10 mL at 12/10/16 0525  . vancomycin (VANCOCIN) IVPB 750 mg/150 ml  premix  750 mg Intravenous QHS Rise Patience, MD   750 mg at 12/10/16 2202     Allergies  Allergen Reactions  . Latex Swelling    SEVERITY OF SWELLING NOT DEFINED.  Marland Kitchen Bextra [Valdecoxib] Other (See Comments)    Stomach Pains  . Motrin [Ibuprofen] Other (See Comments)    Stomach pain     Past Medical History:  Diagnosis Date  . Anxiety   . Arthritis   . Cancer [facility])    breast cancer  . Fatigue   . History of hyperthyroidism    resolved, no medicaions for treatment at this time  . Hypertension   . Insomnia     Past Surgical History:  Procedure Laterality Date  . BREAST LUMPECTOMY     right side  . BREAST LUMPECTOMY WITH AXILLARY LYMPH NODE BIOPSY Left 06/17/2016   Procedure: BREAST LUMPECTOMY WITH AXILLARY LYMPH NODE BIOPSY;  Surgeon: Autumn Messing III, MD;  Location: [place];  Service: General;  Laterality: Left;  . COLONOSCOPY    . DILATION AND CURETTAGE OF UTERUS    . EYE SURGERY     bilateral cataracts removed  . FOOT SURGERY    . PORTACATH PLACEMENT Right 07/16/2016   Procedure: INSERTION PORT-A-CATH RIGHT SUBLCLAVIAN;  Surgeon: Autumn Messing III, MD;  Location: [place];  Service: General;  Laterality: Right;  . TONSILLECTOMY      Family History  Problem Relation Age of Onset  . Heart attack Mother   . Cancer Mother     Social History:  reports that she has never smoked. She has never used smokeless tobacco. She reports that she does not drink alcohol or use drugs.   Review of Systems: Constitutional:  denies fever, chills, diaphoresis, appetite change and fatigue.  HEENT: denies photophobia, eye pain, redness, hearing loss, ear pain, congestion, sore throat, rhinorrhea, sneezing, neck pain, neck stiffness and tinnitus.  Respiratory: admits to SOB, DOE, cough, chest tightness,   Cardiovascular: denies chest pain, palpitations and leg swelling.    Gastrointestinal: denies nausea, vomiting, abdominal pain, diarrhea, constipation, blood in stool.  Genitourinary: denies dysuria, urgency, frequency, hematuria, flank pain and difficulty urinating.  Musculoskeletal: denies  myalgias, back pain, joint swelling, arthralgias and gait problem.   Skin: denies pallor, rash and wound.  Neurological: denies dizziness, seizures, syncope, weakness, light-headedness, numbness and headaches.   Hematological: denies adenopathy, easy bruising, personal or family bleeding history.  Psychiatric/ Behavioral: denies suicidal ideation, mood changes, confusion, nervousness, sleep disturbance and agitation.    Physical Exam: BP (!) 154/51 (BP Location: Left Arm)   Pulse 64   Temp 97.8 F (36.6 C) (Oral)   Resp 17   Ht 5\' 2"  (1.575 m)   Wt 131 lb 9.8 oz (59.7 kg)   SpO2 95%   BMI 24.07 kg/m   Wt Readings from Last 3 Encounters:  12/11/16 131 lb 9.8 oz (59.7 kg)  11/27/16 132 lb (59.9 kg)  11/24/16 130 lb (59 kg)    General: Vital signs reviewed and noted. Well-developed, well-nourished, in no acute distress; alert,   Head: Normocephalic, atraumatic, sclera anicteric,   Neck: Supple. Negative for carotid bruits. + JVD ~ 8-10 cm   Lungs:  Clear bilaterally, no  wheezes, rales, or rhonchi. Breathing is normal   Heart: RRR with S1 , high pitched P2   Abdomen/ GI :  Soft, non-tender, non-distended with normoactive bowel sounds. No hepatomegaly. No rebound/guarding. No obvious abdominal masses   MSK: Strength and the appear normal  for age.   Extremities: No clubbing or cyanosis. No edema.  Distal pedal pulses are 2+ and equal   Neurologic:  CN are grossly intact,  No obvious motor or sensory defect.  Alert and oriented X 3. Moves all extremities spontaneously.  Psych: Responds to questions appropriately with a normal affect.     Lab results: Basic Metabolic Panel:  Recent Labs Lab 12/09/16 1620 12/09/16 2358 12/10/16 0524  NA 133*  --  134*  K  3.4*  --  3.2*  CL 97*  --  97*  CO2 28  --  30  GLUCOSE 105*  --  93  BUN 16  --  15  CREATININE 1.01* 0.94 0.98  CALCIUM 9.6  --  9.0    Liver Function Tests: No results for input(s): AST, ALT, ALKPHOS, BILITOT, PROT, ALBUMIN in the last 168 hours. No results for input(s): LIPASE, AMYLASE in the last 168 hours. No results for input(s): AMMONIA in the last 168 hours.  CBC:  Recent Labs Lab 12/09/16 1620 12/09/16 2358 12/10/16 0524  WBC 4.3 4.2 4.8  HGB 11.9* 11.5* 11.1*  HCT 35.2* 34.2* 33.2*  MCV 81.5 81.8 83.4  PLT 307 294 315    Cardiac Enzymes:  Recent Labs Lab 12/09/16 2358  TROPONINI 0.03*    BNP: Invalid input(s): POCBNP  CBG:  Recent Labs Lab 12/09/16 1614 12/10/16 2142  GLUCAP 112* 105*    Coagulation Studies: No results for input(s): LABPROT, INR in the last 72 hours.   Other results:  Personal review of EKG shows :    Sinus bradycardia 50 beats a minute. She has no ST or T wave changes.  Imaging: Dg Chest 2 View  Result Date: 12/09/2016 CLINICAL DATA:  Weakness, shortness of breath x2 weeks, recently diagnosed breast cancer EXAM: CHEST  2 VIEW COMPARISON:  07/16/2016 FINDINGS: Left upper lobe opacity, new, suspicious for pneumonia. Radiation changes are also possible in the appropriate clinical setting. Mild patchy opacities in the bilateral lower lobe opacities, atelectasis versus pneumonia. No pleural effusion or pneumothorax. Mild cardiomegaly. Right chest power port terminates at the cavoatrial junction. Surgical clips in the left axilla. IMPRESSION: Left upper lobe opacity, new, suspicious for pneumonia. Radiation changes are also possible in the appropriate clinical setting. Mild patchy opacities in the bilateral lower lobes, atelectasis versus pneumonia. Follow-up chest radiographs are suggested in 4-6 weeks to document clearance. Electronically Signed   By: Julian Hy M.D.   On: 12/09/2016 17:10   Ct Angio Chest Pe W Or Wo  Contrast  Result Date: 12/10/2016 CLINICAL DATA:  Shortness of breath.  History breast carcinoma EXAM: CT ANGIOGRAPHY CHEST WITH CONTRAST TECHNIQUE: Multidetector CT imaging of the chest was performed using the standard protocol during bolus administration of intravenous contrast. Multiplanar CT image reconstructions and MIPs were obtained to evaluate the vascular anatomy. CONTRAST:  100 mL Isovue 370 nonionic COMPARISON:  Chest CT May 25, 2016; chest radiograph December 09, 2016 FINDINGS: Cardiovascular: There is no demonstrable pulmonary embolus. There is no thoracic aortic aneurysm or dissection. There are foci of calcification in the aorta. There is moderate calcification in the proximal left subclavian artery. There is moderate calcification in the proximal right common carotid artery. Visualized great vessels otherwise appear unremarkable. There are multiple foci of coronary artery calcification. Port-A-Cath tip is in the superior vena cava. There is a small pericardial effusion. The main pulmonary artery measures 3.4 cm in diameter, a finding suspicious for pulmonary arterial hypertension. Mediastinum/Nodes: Thyroid contains a  benign-appearing calcification on the right measuring 7 x 6 mm. Thyroid otherwise appears unremarkable. There are multiple subcentimeter mediastinal lymph nodes. There is no adenopathy by size criteria, however. Lungs/Pleura: There is airspace consolidation in the left upper lobe with involvement of portions of the anterior and posterior segments of the left upper lobe. There is a moderate left pleural effusion. There is lower lobe interstitial edema bilaterally. Upper Abdomen: In the visualized upper abdomen, there is atherosclerotic calcification in the aorta. There is mild reflux of contrast into the inferior vena cava and hepatic veins. Visualized upper abdominal structures otherwise appear unremarkable. Musculoskeletal: There is diffuse edema throughout the left breast with skin  thickening over the left breast. There are no blastic or lytic bone lesions. There are surgical clips in the left axillary region. Review of the MIP images confirms the above findings. IMPRESSION: No demonstrable pulmonary embolus. Suspect pulmonary arterial hypertension given prominence of the main pulmonary outflow tract. Multiple foci of arterial atherosclerosis. Foci of coronary artery calcification noted. Small pericardial effusion. There is airspace consolidation in the left upper lobe, likely due to pneumonia. There may be a degree of underlying radiation fibrosis. There is a moderate left pleural effusion. Interstitial edema in the lung bases. Question a degree of congestive heart failure versus possible lymphangitic spread of tumor in the lower lung zone regions. Both entities may exist concurrently. Edema in the left breast with skin thickening, likely due to postoperative and radiation therapy change. Reflux of contrast into the inferior vena cava and hepatic veins may be indicative of a degree of increase in right heart pressure. Electronically Signed   By: Lowella Grip III M.D.   On: 12/10/2016 10:39         Assessment & Plan:  1. Pulmonary hypertension: The patient presents with moderate to severe pulmonary artery pressures. Her estimated PA pressures 64.  I suspect that this is from underlying lung disease and possibly obstructive sleep apnea. Her chest  CT scan shows    "Interstitial edema in the lung bases. Question a degree of congestive heart failure versus possible lymphangitic spread of tumor in the lower lung zone regions. Both entities may exist Concurrently."  We'll add low-dose Lasix to see if this improves her symptoms. We might also consider adding Aldactone. Post consider getting a pulmonary consult to see if they think that the degree of lung disease would explain her pulmonary hypertension. She'll need pulmonary function test including DLCO.  If she does not  found to have significant lung disease to explain her pulmonary artery hypertension and she may need a cardiac catheterization with adenosine testing to see if the pulmonary hypertension is reversible.  Thayer Headings, Brooke Bonito., MD, [facility] 12/11/2016, 7:56 AM Office - [phone] Pager [phone]

## 2016-12-12 ENCOUNTER — Inpatient Hospital Stay (HOSPITAL_COMMUNITY): Payer: Medicare Other

## 2016-12-12 DIAGNOSIS — I2729 Other secondary pulmonary hypertension: Secondary | ICD-10-CM

## 2016-12-12 DIAGNOSIS — I5043 Acute on chronic combined systolic (congestive) and diastolic (congestive) heart failure: Secondary | ICD-10-CM

## 2016-12-12 LAB — BASIC METABOLIC PANEL
Anion gap: 7 (ref 5–15)
BUN: 23 mg/dL — AB (ref 6–20)
CHLORIDE: 102 mmol/L (ref 101–111)
CO2: 28 mmol/L (ref 22–32)
CREATININE: 1.07 mg/dL — AB (ref 0.44–1.00)
Calcium: 9.2 mg/dL (ref 8.9–10.3)
GFR, EST AFRICAN AMERICAN: 53 mL/min — AB (ref >60)
GFR, EST NON AFRICAN AMERICAN: 46 mL/min — AB (ref >60)
Glucose, Bld: 97 mg/dL (ref 65–99)
Potassium: 3.8 mmol/L (ref 3.5–5.1)
SODIUM: 137 mmol/L (ref 135–145)

## 2016-12-12 LAB — C-REACTIVE PROTEIN

## 2016-12-12 LAB — LEGIONELLA PNEUMOPHILA SEROGP 1 UR AG: L. pneumophila Serogp 1 Ur Ag: NEGATIVE

## 2016-12-12 LAB — SEDIMENTATION RATE: SED RATE: 20 mm/h (ref 0–22)

## 2016-12-12 MED ORDER — HEPARIN SOD (PORK) LOCK FLUSH 100 UNIT/ML IV SOLN
500.0000 [IU] | INTRAVENOUS | Status: AC | PRN
  Administered 2016-12-12: 500 [IU]

## 2016-12-12 MED ORDER — POTASSIUM CHLORIDE CRYS ER 20 MEQ PO TBCR: 20.0000 meq | EXTENDED_RELEASE_TABLET | Freq: Every day | ORAL | 0 refills | Status: DC

## 2016-12-12 MED ORDER — FUROSEMIDE 40 MG PO TABS: 20.0000 mg | ORAL_TABLET | Freq: Every day | ORAL | 0 refills | Status: DC

## 2016-12-12 NOTE — Progress Notes (Signed)
Patient ambulated in hall with no issues on room air, O2 SATs were 98-96%. Patient did not have any complaints or SHOB.

## 2016-12-12 NOTE — Discharge Summary (Signed)
Physician Discharge Summary  Charlotte [name] [id number] DOB: [birth year] DOA: 12/09/2016  PCP: Rosita Fire, MD  Admit date: 12/09/2016 Discharge date: 12/12/2016  Admitted From:home Disposition:home  Recommendations for Outpatient Follow-up:  1. Follow up with PCP in 1-2 weeks 2. Please obtain BMP/CBC in one week  Home Health:No Equipment/Devices: No Discharge Condition: Stable CODE STATUS: Full code Diet recommendation: Heart healthy  Brief/Interim Summary: 82 y.o.femalewith history of breast cancer status post lumpectomy on anastrozole presents to the ER because of increasing shortness of breath. Patient over last 1 week as been having exertional shortness of breath. Denies any chest pain fever chills productive cough. Patient was initially started on Herceptin by patient's oncologist for the breast cancer, which was stopped recently after patient's 2-D echo showeda decreasing EF. 2-D echo done on 11/17/2016 showed EF of 50% with elevated pulmonary artery pressure and grade 2 diastolic dysfunction.Chest x-ray done in the ER shows possible pneumonia and was started on antibiotics.  #Shortness of breath and dyspnea on exertion possibly due to acute on chronic combined heart failure with mild left ventricular dysfunction, moderate diastolic dysfunction and pulmonary hypertension  -CT scan of chest is concerning for left upper lobe pneumonia. No evidence of pulmonary embolism. There is also moderate left pleural effusion. -Presumed pneumonia on admission therefore treated with antibiotics. Later on the antibiotic was discontinued by pulmonologist. -The patient was treated with Lasix with improvement in symptoms today. Clinically feels better today. Evaluated by both pulmonologist and cardiologist. Repeated chest x-ray today with no significant change. Pulmonary recommended outpatient follow-up for pulmonary function test and repeat x-ray. Follow-up appointment already made. Cardiologist  also recommended to discharge with oral Lasix and potassium chloride with outpatient follow-up. -Patient is clinically improved. Her oxygen saturation is acceptable in room air. She ambulated today with the nurse with oxygen saturation in acceptable range. She denied chest pain, shortness of breath, cough. The plan is to discharge her home today with outpatient follow-up. She verbalized understanding of follow-up instruction. Education on low salt diet. -Respiratory viral studies negative.   #Severe pulmonary hypertension: Currently on felodipine. Advised outpatient follow-up with cardiologist. He may consider cardiac catheterization.  #Hypertension: Resume home medication. Monitor blood pressure.  #History of breast cancer status post radiation treatment, and follow up with oncologist.  #Recently diagnosed with sleep apnea undergoing evaluation for CPAP. Advised to continue to follow up for that.  #Hypokalemia: Discharged with oral potassium chloride. Advised to monitor lab in a week. Verbalized understanding.  Patient is clinically improved. Management and follow-up as discussed above. She looks better.   Discharge Diagnoses:  Principal Problem:   Acute respiratory failure with hypoxia (HCC) Active Problems:   Generalized anxiety disorder   Malignant neoplasm of upper-inner quadrant of left female breast ([place])   Community acquired pneumonia   Essential hypertension   SOB (shortness of breath)   Acute respiratory failure [facility])    Discharge Instructions  Discharge Instructions    Call MD for:  difficulty breathing, headache or visual disturbances    Complete by:  As directed    Call MD for:  extreme fatigue    Complete by:  As directed    Call MD for:  hives    Complete by:  As directed    Call MD for:  persistant dizziness or light-headedness    Complete by:  As directed    Call MD for:  persistant nausea and vomiting    Complete by:  As directed    Call MD for:  severe uncontrolled pain    Complete by:  As directed    Call MD for:  temperature >100.4    Complete by:  As directed    Diet - low sodium heart healthy    Complete by:  As directed    Discharge instructions    Complete by:  As directed    Please follow up with pulmonologist, cardiologist and your primary care doctor. Low salt diet   Increase activity slowly    Complete by:  As directed      Allergies as of 12/12/2016      Reactions   Latex Swelling   SEVERITY OF SWELLING NOT DEFINED.   Bextra [valdecoxib] Other (See Comments)   Stomach Pains   Motrin [ibuprofen] Other (See Comments)   Stomach pain      Medication List    TAKE these medications   anastrozole 1 MG tablet Commonly known as:  ARIMIDEX Take 1 tablet (1 mg total) by mouth daily.   aspirin EC 81 MG tablet Take 81 mg by mouth daily.   CALCIUM 600+D3 PO Take 1 tablet by mouth 2 (two) times daily.   carbamide peroxide 6.5 % otic solution Commonly known as:  DEBROX Place 1 drop into both ears daily as needed (ear wax buildup).   CENTRUM ADULTS PO Take 1 tablet by mouth daily.   cycloSPORINE 0.05 % ophthalmic emulsion Commonly known as:  RESTASIS Place 1 drop into both eyes 2 (two) times daily.   felodipine 5 MG 24 hr tablet Commonly known as:  PLENDIL Take 5 mg by mouth daily.   FISH OIL TRIPLE STRENGTH 1400 MG Caps Take 1 tablet by mouth daily.   fluticasone 50 MCG/ACT nasal spray Commonly known as:  FLONASE Place 2 sprays into the nose daily as needed for allergies.   furosemide 40 MG tablet Commonly known as:  LASIX Take 0.5 tablets (20 mg total) by mouth daily. Start taking on:  12/13/2016   hydrALAZINE 50 MG tablet Commonly known as:  APRESOLINE Take 1 tablet (50 mg total) by mouth 2 (two) times daily.   LORazepam 1 MG tablet Commonly known as:  ATIVAN Take 0.5 mg by mouth every 8 (eight) hours as needed for anxiety or sleep.   losartan 100 MG tablet Commonly known as:   COZAAR Take 100 mg by mouth daily.   metoprolol succinate 25 MG 24 hr tablet Commonly known as:  TOPROL XL Take 0.5 tablets (12.5 mg total) by mouth daily.   non-metallic deodorant Misc Commonly known as:  ALRA Apply 1 application topically daily as needed.   potassium chloride SA 20 MEQ tablet Commonly known as:  K-DUR,KLOR-CON Take 1 tablet (20 mEq total) by mouth daily. Start taking on:  12/13/2016   SALONPAS PAIN RELIEF PATCH Pads Apply 1 each topically daily as needed (pain).   SYSTANE 0.4-0.3 % Soln Generic drug:  Polyethyl Glycol-Propyl Glycol Apply 1 drop to eye 2 (two) times daily.   tretinoin 0.025 % cream Commonly known as:  RETIN-A Apply 1 application topically at bedtime.      Follow-up Information    Magdalen Spatz, NP Follow up on 12/23/2016.   Specialty:  Pulmonary Disease Why:  follow-up appointment  on 1/3 at 4:30 PM with Eric Form, NP. Please call to confirm. Contact information: [address] [place] [address] [phone]        FANTA,TESFAYE, MD. Schedule an appointment as soon as possible for a visit in 1 week(s).  Specialty:  Internal Medicine Contact information: [place] [address] [phone]        Carlyle Dolly, MD. Schedule an appointment as soon as possible for a visit in 2 week(s).   Specialty:  Cardiology Contact information: [place] [address] [phone]          Allergies  Allergen Reactions  . Latex Swelling    SEVERITY OF SWELLING NOT DEFINED.  Marland Kitchen Bextra [Valdecoxib] Other (See Comments)    Stomach Pains  . Motrin [Ibuprofen] Other (See Comments)    Stomach pain    Consultations: Cardiologist Pulmonologist  Procedures/Studies: X-ray and CT scan  Subjective: Patient was seen and examined at bedside. Patient reported feeling good today. Denied chest pain, shortness of breath, cough, nausea, vomiting, abdominal pain. She reported feeling  better today. No headache, dizziness fever or chills. Able to ambulate with acceptable oxygen saturation. I discussed the discharge with the patient she verbalized understanding about discharge instructions and follow-ups.  Discharge Exam: Vitals:   12/11/16 2058 12/12/16 0457  BP: (!) 168/65 (!) 133/51  Pulse: 67 (!) 58  Resp: 15 16  Temp: 98.4 F (36.9 C) 98.6 F (37 C)   Vitals:   12/11/16 1350 12/11/16 2058 12/12/16 0457 12/12/16 1000  BP: (!) 131/51 (!) 168/65 (!) 133/51   Pulse: 78 67 (!) 58   Resp: 16 15 16    Temp: 98.3 F (36.8 C) 98.4 F (36.9 C) 98.6 F (37 C)   TempSrc: Oral Oral Oral   SpO2: 98% 96% 97% 98%  Weight:   57.7 kg (127 lb 3.3 oz)   Height:        General: Pt is alert, awake, not in acute distress Cardiovascular: RRR, S1/S2 +, no rubs, no gallops Respiratory: CTA bilaterally, no wheezing, no rhonchi Abdominal: Soft, NT, ND, bowel sounds + Extremities: no edema, no cyanosis Neurologic: Alert, awake, oriented 3. Nonfocal.   The results of significant diagnostics from this hospitalization (including imaging, microbiology, ancillary and laboratory) are listed below for reference.     Microbiology: Recent Results (from the past 240 hour(s))  Culture, blood (Routine X 2) w Reflex to ID Panel     Status: None (Preliminary result)   Collection Time: 12/09/16 10:04 PM  Result Value Ref Range Status   Specimen Description LEFT ANTECUBITAL  Final   Special Requests IN PEDIATRIC BOTTLE 3CC  Final   Culture   Final    NO GROWTH 2 DAYS Performed at [facility]    Report Status PENDING  Incomplete  Culture, blood (Routine X 2) w Reflex to ID Panel     Status: None (Preliminary result)   Collection Time: 12/09/16 10:04 PM  Result Value Ref Range Status   Specimen Description RIGHT ANTECUBITAL  Final   Special Requests BOTTLES DRAWN AEROBIC AND ANAEROBIC 5CC  Final   Culture   Final    NO GROWTH 2 DAYS Performed at [facility]     Report Status PENDING  Incomplete  Respiratory Panel by PCR     Status: None   Collection Time: 12/10/16  3:30 PM  Result Value Ref Range Status   Adenovirus NOT DETECTED NOT DETECTED Final   Coronavirus 229E NOT DETECTED NOT DETECTED Final   Coronavirus HKU1 NOT DETECTED NOT DETECTED Final   Coronavirus NL63 NOT DETECTED NOT DETECTED Final   Coronavirus OC43 NOT DETECTED NOT DETECTED Final   Metapneumovirus NOT DETECTED NOT DETECTED Final   Rhinovirus /  Enterovirus NOT DETECTED NOT DETECTED Final   Influenza A NOT DETECTED NOT DETECTED Final   Influenza B NOT DETECTED NOT DETECTED Final   Parainfluenza Virus 1 NOT DETECTED NOT DETECTED Final   Parainfluenza Virus 2 NOT DETECTED NOT DETECTED Final   Parainfluenza Virus 3 NOT DETECTED NOT DETECTED Final   Parainfluenza Virus 4 NOT DETECTED NOT DETECTED Final   Respiratory Syncytial Virus NOT DETECTED NOT DETECTED Final   Bordetella pertussis NOT DETECTED NOT DETECTED Final   Chlamydophila pneumoniae NOT DETECTED NOT DETECTED Final   Mycoplasma pneumoniae NOT DETECTED NOT DETECTED Final    Comment: Performed at [place]: BNP (last 3 results)  Recent Labs  12/09/16 2358  BNP [id number]*   Basic Metabolic Panel:  Recent Labs Lab 12/09/16 1620 12/09/16 2358 12/10/16 0524 12/11/16 0856 12/12/16 0317  NA 133*  --  134* 136 137  K 3.4*  --  3.2* 3.6 3.8  CL 97*  --  97* 100* 102  CO2 28  --  30 27 28   GLUCOSE 105*  --  93 109* 97  BUN 16  --  15 17 23*  CREATININE 1.01* 0.94 0.98 1.00 1.07*  CALCIUM 9.6  --  9.0 9.2 9.2   Liver Function Tests: No results for input(s): AST, ALT, ALKPHOS, BILITOT, PROT, ALBUMIN in the last 168 hours. No results for input(s): LIPASE, AMYLASE in the last 168 hours. No results for input(s): AMMONIA in the last 168 hours. CBC:  Recent Labs Lab 12/09/16 1620 12/09/16 2358 12/10/16 0524  WBC 4.3 4.2 4.8  HGB 11.9* 11.5* 11.1*  HCT 35.2* 34.2* 33.2*  MCV 81.5 81.8 83.4   PLT 307 294 315   Cardiac Enzymes:  Recent Labs Lab 12/09/16 2358 12/11/16 0856  TROPONINI 0.03* 0.03*   BNP: Invalid input(s): POCBNP CBG:  Recent Labs Lab 12/09/16 1614 12/10/16 2142  GLUCAP 112* 105*   D-Dimer  Recent Labs  12/09/16 2358  DDIMER 1.59*   Hgb A1c No results for input(s): HGBA1C in the last 72 hours. Lipid Profile No results for input(s): CHOL, HDL, LDLCALC, TRIG, CHOLHDL, LDLDIRECT in the last 72 hours. Thyroid function studies No results for input(s): TSH, T4TOTAL, T3FREE, THYROIDAB in the last 72 hours.  Invalid input(s): FREET3 Anemia work up No results for input(s): VITAMINB12, FOLATE, FERRITIN, TIBC, IRON, RETICCTPCT in the last 72 hours. Urinalysis    Component Value Date/Time   COLORURINE YELLOW 12/09/2016 1951   APPEARANCEUR CLEAR 12/09/2016 1951   LABSPEC 1.009 12/09/2016 1951   PHURINE 7.0 12/09/2016 1951   GLUCOSEU NEGATIVE 12/09/2016 1951   HGBUR NEGATIVE 12/09/2016 1951   BILIRUBINUR NEGATIVE 12/09/2016 1951   KETONESUR NEGATIVE 12/09/2016 1951   PROTEINUR NEGATIVE 12/09/2016 1951   UROBILINOGEN 0.2 10/14/2015 0824   NITRITE NEGATIVE 12/09/2016 1951   LEUKOCYTESUR NEGATIVE 12/09/2016 1951   Sepsis Labs Invalid input(s): PROCALCITONIN,  WBC,  LACTICIDVEN Microbiology Recent Results (from the past 240 hour(s))  Culture, blood (Routine X 2) w Reflex to ID Panel     Status: None (Preliminary result)   Collection Time: 12/09/16 10:04 PM  Result Value Ref Range Status   Specimen Description LEFT ANTECUBITAL  Final   Special Requests IN PEDIATRIC BOTTLE 3CC  Final   Culture   Final    NO GROWTH 2 DAYS Performed at [facility]    Report Status PENDING  Incomplete  Culture, blood (Routine X 2) w Reflex to ID Panel  Status: None (Preliminary result)   Collection Time: 12/09/16 10:04 PM  Result Value Ref Range Status   Specimen Description RIGHT ANTECUBITAL  Final   Special Requests BOTTLES DRAWN AEROBIC AND  ANAEROBIC 5CC  Final   Culture   Final    NO GROWTH 2 DAYS Performed at [facility]    Report Status PENDING  Incomplete  Respiratory Panel by PCR     Status: None   Collection Time: 12/10/16  3:30 PM  Result Value Ref Range Status   Adenovirus NOT DETECTED NOT DETECTED Final   Coronavirus 229E NOT DETECTED NOT DETECTED Final   Coronavirus HKU1 NOT DETECTED NOT DETECTED Final   Coronavirus NL63 NOT DETECTED NOT DETECTED Final   Coronavirus OC43 NOT DETECTED NOT DETECTED Final   Metapneumovirus NOT DETECTED NOT DETECTED Final   Rhinovirus / Enterovirus NOT DETECTED NOT DETECTED Final   Influenza A NOT DETECTED NOT DETECTED Final   Influenza B NOT DETECTED NOT DETECTED Final   Parainfluenza Virus 1 NOT DETECTED NOT DETECTED Final   Parainfluenza Virus 2 NOT DETECTED NOT DETECTED Final   Parainfluenza Virus 3 NOT DETECTED NOT DETECTED Final   Parainfluenza Virus 4 NOT DETECTED NOT DETECTED Final   Respiratory Syncytial Virus NOT DETECTED NOT DETECTED Final   Bordetella pertussis NOT DETECTED NOT DETECTED Final   Chlamydophila pneumoniae NOT DETECTED NOT DETECTED Final   Mycoplasma pneumoniae NOT DETECTED NOT DETECTED Final    Comment: Performed at [facility]     Time coordinating discharge: Over 30 minutes  SIGNED:   Rosita Fire, MD  [facility] 12/12/2016, 1:59 PM  If 7PM-7AM, please contact night-coverage [email or web address] Password [email or web address]

## 2016-12-12 NOTE — Evaluation (Signed)
Occupational Therapy Evaluation Patient Details Name: Charlotte [name] MRN: [id number] DOB: 1928/08/31 Today's Date: 12/12/2016    History of Present Illness pt was admitted for acute respiratory failure with hypoxia.  Found to have severe pulponary HTN and moderate L pleural effusion and possible pna. H/o breast CA in June '17.  She is s/p XRT   Clinical Impression   This 82 year old female was admitted for the above.  She will benefit from continued OT to increase safety and independence with ADLs.  Pt was independent prior to admission.  Goals are for supervision to mod I level    Follow Up Recommendations  No OT follow up;Supervision/Assistance - 24 hour    Equipment Recommendations   (? tub seat, to be determined)    Recommendations for Other Services       Precautions / Restrictions Precautions Precautions: Fall Restrictions Weight Bearing Restrictions: No      Mobility Bed Mobility Overal bed mobility: Independent                Transfers Overall transfer level: Needs assistance Equipment used: None Transfers: Sit to/from Stand Sit to Stand: Supervision;Min guard         General transfer comment: pt a little unsteady; moves quickly but no LOB    Balance                                            ADL Overall ADL's : Needs assistance/impaired     Grooming: Supervision/safety;Standing   Upper Body Bathing: Supervision/ safety;Standing   Lower Body Bathing: Supervison/ safety;Sit to/from stand   Upper Body Dressing : Sitting;Set up (assist for tele monitor)   Lower Body Dressing: Set up;Supervision/safety;Sit to/from stand   Toilet Transfer: Min guard;Supervision/safety;Ambulation   Toileting- Clothing Manipulation and Hygiene: Supervision/safety;Sit to/from stand         General ADL Comments: performed ADL at sink in bathroom.  Started education on energy conservation.  Pt tends to not take breaks:  encouraged her to  do this as well as sit to start underwear and pants.  02 sats on RA 97%.  Dyspnea 2/4     Vision     Perception     Praxis      Pertinent Vitals/Pain Pain Assessment: No/denies pain     Hand Dominance     Extremity/Trunk Assessment Upper Extremity Assessment Upper Extremity Assessment: Overall WFL for tasks assessed           Communication Communication Communication: No difficulties   Cognition Arousal/Alertness: Awake/alert Behavior During Therapy: WFL for tasks assessed/performed Overall Cognitive Status: Within Functional Limits for tasks assessed                     General Comments       Exercises       Shoulder Instructions      Home Living Family/patient expects to be discharged to:: Private residence Living Arrangements: Spouse/significant other;Children Available Help at Discharge: Family               Bathroom Shower/Tub: Tub/shower unit Shower/tub characteristics: Curtain Biochemist, clinical: Standard                Prior Functioning/Environment Level of Independence: Independent                 OT Problem List: Decreased activity  tolerance;Decreased knowledge of use of DME or AE;Impaired balance (sitting and/or standing)   OT Treatment/Interventions: Self-care/ADL training;DME and/or AE instruction;Balance training;Patient/family education    OT Goals(Current goals can be found in the care plan section) Acute Rehab OT Goals Patient Stated Goal: resume independence OT Goal Formulation: With patient Time For Goal Achievement: 12/19/16 Potential to Achieve Goals: Good ADL Goals Pt Will Transfer to Toilet: with modified independence;regular height toilet;ambulating Pt Will Perform Tub/Shower Transfer: Tub transfer;ambulating;with supervision;shower seat (vs no seat) Additional ADL Goal #1: pt will gather clothes at supervision level and complete adl, without assistance Additional ADL Goal #2: pt will initiate at least  one rest break during adls for energy conservation and verbalize 3 strategies  OT Frequency: Min 2X/week   Barriers to D/C:            Co-evaluation              End of Session    Activity Tolerance: Patient tolerated treatment well Patient left: in bed;with call bell/phone within reach;with bed alarm set   Time: [id number] OT Time Calculation (min): 14 min Charges:  OT General Charges $OT Visit: 1 Procedure OT Evaluation $OT Eval Low Complexity: 1 Procedure G-Codes:    Shalini Mair 12-21-2016, 9:26 AM  Lesle Chris, OTR/L [phone] 12/21/2016

## 2016-12-12 NOTE — Progress Notes (Signed)
Patient Name: Charlotte [name] Date of Encounter: 12/12/2016  Primary Cardiologist: Dr. Carlyle Dolly  Subjective   No chest pain or breathlessness at rest. Had bowel movement this morning. Did not get much sleep last night.  Inpatient Medications    Scheduled Meds: . anastrozole  1 mg Oral Daily  . aspirin EC  81 mg Oral Daily  . cycloSPORINE  1 drop Both Eyes BID  . enoxaparin (LOVENOX) injection  40 mg Subcutaneous QHS  . feeding supplement (ENSURE ENLIVE)  237 mL Oral BID BM  . felodipine  5 mg Oral Daily  . furosemide  40 mg Oral Daily  . hydrALAZINE  50 mg Oral BID  . losartan  100 mg Oral Daily  . metoprolol succinate  12.5 mg Oral Daily  . omega-3 acid ethyl esters  2 g Oral Daily  . polyvinyl alcohol  1 drop Both Eyes BID  . potassium chloride  20 mEq Oral Daily    PRN Meds: acetaminophen **OR** acetaminophen, hydrALAZINE, LORazepam, ondansetron **OR** ondansetron (ZOFRAN) IV, sodium chloride flush   Vital Signs    Vitals:   12/11/16 1326 12/11/16 1350 12/11/16 2058 12/12/16 0457  BP:  (!) 131/51 (!) 168/65 (!) 133/51  Pulse:  78 67 (!) 58  Resp:  16 15 16   Temp:  98.3 F (36.8 C) 98.4 F (36.9 C) 98.6 F (37 C)  TempSrc:  Oral Oral Oral  SpO2: 95% 98% 96% 97%  Weight:    127 lb 3.3 oz (57.7 kg)  Height:        Intake/Output Summary (Last 24 hours) at 12/12/16 0832 Last data filed at 12/12/16 0457  Gross per 24 hour  Intake              870 ml  Output             1950 ml  Net            -1080 ml   Filed Weights   12/10/16 0446 12/11/16 0537 12/12/16 0457  Weight: 125 lb 10.6 oz (57 kg) 131 lb 9.8 oz (59.7 kg) 127 lb 3.3 oz (57.7 kg)    Physical Exam   Gen.: Patient appears comfortable at rest. HEENT: Conjunctiva and lids normal, oropharynx clear. Neck: Supple, no elevated JVP or carotid bruits, no thyromegaly. Lungs: Decreased at left base, nonlabored breathing at rest. Cardiac: Regular rate and rhythm, no S3 or significant systolic  murmur. Abdomen: Soft, nontender, bowel sounds present, no guarding or rebound. Extremities: No pitting edema, distal pulses 2+.  Labs    CBC  Recent Labs  12/09/16 2358 12/10/16 0524  WBC 4.2 4.8  HGB 11.5* 11.1*  HCT 34.2* 33.2*  MCV 81.8 83.4  PLT 294 [id number]   Basic Metabolic Panel  Recent Labs  12/11/16 0856 12/12/16 0317  NA 136 137  K 3.6 3.8  CL 100* 102  CO2 27 28  GLUCOSE 109* 97  BUN 17 23*  CREATININE 1.00 1.07*  CALCIUM 9.2 9.2   Cardiac Enzymes  Recent Labs  12/09/16 2358 12/11/16 0856  TROPONINI 0.03* 0.03*   D-Dimer  Recent Labs  12/09/16 2358  DDIMER 1.59*   Telemetry    I personally reviewed telemetry monitoring which shows sinus rhythm, occasional PACs.  ECG    I personally reviewed the tracing from 12/09/2016 which shows sinus rhythm with leftward axis, PAC, increased voltage and nonspecific T-wave changes.  Radiology    Ct Angio Chest Pe W Or Wo Contrast  Result Date: 12/10/2016 CLINICAL DATA:  Shortness of breath.  History breast carcinoma EXAM: CT ANGIOGRAPHY CHEST WITH CONTRAST TECHNIQUE: Multidetector CT imaging of the chest was performed using the standard protocol during bolus administration of intravenous contrast. Multiplanar CT image reconstructions and MIPs were obtained to evaluate the vascular anatomy. CONTRAST:  100 mL Isovue 370 nonionic COMPARISON:  Chest CT May 25, 2016; chest radiograph December 09, 2016 FINDINGS: Cardiovascular: There is no demonstrable pulmonary embolus. There is no thoracic aortic aneurysm or dissection. There are foci of calcification in the aorta. There is moderate calcification in the proximal left subclavian artery. There is moderate calcification in the proximal right common carotid artery. Visualized great vessels otherwise appear unremarkable. There are multiple foci of coronary artery calcification. Port-A-Cath tip is in the superior vena cava. There is a small pericardial effusion. The main  pulmonary artery measures 3.4 cm in diameter, a finding suspicious for pulmonary arterial hypertension. Mediastinum/Nodes: Thyroid contains a benign-appearing calcification on the right measuring 7 x 6 mm. Thyroid otherwise appears unremarkable. There are multiple subcentimeter mediastinal lymph nodes. There is no adenopathy by size criteria, however. Lungs/Pleura: There is airspace consolidation in the left upper lobe with involvement of portions of the anterior and posterior segments of the left upper lobe. There is a moderate left pleural effusion. There is lower lobe interstitial edema bilaterally. Upper Abdomen: In the visualized upper abdomen, there is atherosclerotic calcification in the aorta. There is mild reflux of contrast into the inferior vena cava and hepatic veins. Visualized upper abdominal structures otherwise appear unremarkable. Musculoskeletal: There is diffuse edema throughout the left breast with skin thickening over the left breast. There are no blastic or lytic bone lesions. There are surgical clips in the left axillary region. Review of the MIP images confirms the above findings. IMPRESSION: No demonstrable pulmonary embolus. Suspect pulmonary arterial hypertension given prominence of the main pulmonary outflow tract. Multiple foci of arterial atherosclerosis. Foci of coronary artery calcification noted. Small pericardial effusion. There is airspace consolidation in the left upper lobe, likely due to pneumonia. There may be a degree of underlying radiation fibrosis. There is a moderate left pleural effusion. Interstitial edema in the lung bases. Question a degree of congestive heart failure versus possible lymphangitic spread of tumor in the lower lung zone regions. Both entities may exist concurrently. Edema in the left breast with skin thickening, likely due to postoperative and radiation therapy change. Reflux of contrast into the inferior vena cava and hepatic veins may be indicative of  a degree of increase in right heart pressure. Electronically Signed   By: Lowella Grip III M.D.   On: 12/10/2016 10:39    Cardiac Studies   Echocardiogram 11/17/2016: Study Conclusions  - Left ventricle: The cavity size was normal. Wall thickness was   increased in a pattern of moderate LVH. Systolic function was   mildly reduced. The estimated ejection fraction was 50%. LVEF   calculated at 46% by speckle tracking. Reduced global   longitudinal strain of -13.8%. Features are consistent with a   pseudonormal left ventricular filling pattern, with concomitant   abnormal relaxation and increased filling pressure (grade 2   diastolic dysfunction). - Aortic valve: Mildly calcified annulus. Trileaflet; mildly   thickened leaflets. There was trivial regurgitation. - Mitral valve: There was mild regurgitation. - Left atrium: The atrium was moderately dilated. - Right atrium: Central venous pressure (est): 3 mm Hg. - Atrial septum: No defect or patent foramen ovale was identified. - Tricuspid valve: There  was mild regurgitation. - Pulmonary arteries: Systolic pressure was severely increased. PA   peak pressure: 64 mm Hg (S). - Pericardium, extracardiac: There was no pericardial effusion.  Impressions:  - Moderate LVH with LVEF approximately 50% (46% calculated by   speckle tracking). Grade 2 diastolic dysfunction. Moderate left   atrial enlargement. Mild mitral regurgitation. Mildly sclerotic   aortic valve with trivial aortic regurgitation. Mild tricuspid   regurgitation with severely increased pulmonary artery systolic   pressure of 64 mmHg.  Patient Profile     82 year old woman with history breast cancer, OSA, combined heart failure with mild LV systolic dysfunction and moderate diastolic dysfunction, mixed pulmonary hypertension of severe degree with PASP 64 mmHg, and abnormal lung opacity left upper lobe/lingula as well as left pleural effusion. Has been seen by Cardiology  and Pulmonary.   Assessment & Plan    1. Acute on chronic combined heart failure with mild LV dysfunction, moderate diastolic dysfunction. Started on Lasix yesterday with diuresis of approximately 1200 cc. Additional medications include Cozaar, Toprol-XL, and hydralazine. Recent echocardiogram noted above.  2. Severe pulmonary hypertension, PASP 64 mmHg. Agree with Dr. Ashok Cordia that this is probably multifactorial. She should have ambulatory oxygen saturation checked, additional studies sent and suggested by Pulmonary. Would not pursue right heart catheterization at this time.  3. Left pleural effusion, agree with starting Lasix. As discussed by Pulmonary, a thoracentesis may also be useful to help clarify diagnosis in light of lung opacity.  No changes made today from cardiac perspective. Current regimen includes aspirin, hydralazine, Cozaar, Toprol-XL, and Lovaza. Continue current dose of Lasix 40 mg daily with potassium supplements. May be able to cut this back to 20 mg daily as a standing medicine. Would increase activity, check ambulatory oxygen saturation. Hopefully she will be able to go home and complete further workup as an outpatient.  Signed, Satira Sark, M.D., F.A.C.C.  12/12/2016, 8:32 AM

## 2016-12-12 NOTE — Progress Notes (Signed)
Discussed with patient discharge instructions reviewed with patient and family. All questions answered. Patient leaving with all belongings to go home in private vehicle.

## 2016-12-13 LAB — CYCLIC CITRUL PEPTIDE ANTIBODY, IGG/IGA: CCP ANTIBODIES IGG/IGA: 14 U (ref 0–19)

## 2016-12-13 LAB — RHEUMATOID FACTOR

## 2016-12-15 ENCOUNTER — Emergency Department (HOSPITAL_COMMUNITY): Payer: Medicare Other

## 2016-12-15 ENCOUNTER — Ambulatory Visit: Payer: Self-pay

## 2016-12-15 ENCOUNTER — Other Ambulatory Visit: Payer: Self-pay

## 2016-12-15 ENCOUNTER — Encounter (HOSPITAL_COMMUNITY): Payer: Self-pay

## 2016-12-15 ENCOUNTER — Emergency Department (HOSPITAL_COMMUNITY)
Admission: EM | Admit: 2016-12-15 | Discharge: 2016-12-15 | Disposition: A | Discharge status: Home / Self Care | Payer: Medicare Other | Attending: Emergency Medicine | Admitting: Emergency Medicine

## 2016-12-15 DIAGNOSIS — R0600 Dyspnea, unspecified: Secondary | ICD-10-CM

## 2016-12-15 DIAGNOSIS — Z79899 Other long term (current) drug therapy: Secondary | ICD-10-CM | POA: Diagnosis not present

## 2016-12-15 DIAGNOSIS — Z7722 Contact with and (suspected) exposure to environmental tobacco smoke (acute) (chronic): Secondary | ICD-10-CM | POA: Insufficient documentation

## 2016-12-15 DIAGNOSIS — E876 Hypokalemia: Secondary | ICD-10-CM

## 2016-12-15 DIAGNOSIS — Z853 Personal history of malignant neoplasm of breast: Secondary | ICD-10-CM | POA: Diagnosis not present

## 2016-12-15 DIAGNOSIS — R531 Weakness: Secondary | ICD-10-CM | POA: Diagnosis not present

## 2016-12-15 DIAGNOSIS — R404 Transient alteration of awareness: Secondary | ICD-10-CM | POA: Diagnosis not present

## 2016-12-15 DIAGNOSIS — I10 Essential (primary) hypertension: Secondary | ICD-10-CM | POA: Diagnosis not present

## 2016-12-15 DIAGNOSIS — I110 Hypertensive heart disease with heart failure: Secondary | ICD-10-CM | POA: Diagnosis not present

## 2016-12-15 DIAGNOSIS — I5043 Acute on chronic combined systolic (congestive) and diastolic (congestive) heart failure: Secondary | ICD-10-CM | POA: Insufficient documentation

## 2016-12-15 DIAGNOSIS — R0602 Shortness of breath: Secondary | ICD-10-CM | POA: Diagnosis not present

## 2016-12-15 DIAGNOSIS — R061 Stridor: Secondary | ICD-10-CM | POA: Diagnosis not present

## 2016-12-15 LAB — CBC WITH DIFFERENTIAL/PLATELET
BASOS ABS: 0 10*3/uL (ref 0.0–0.1)
Basophils Relative: 1 %
EOS PCT: 2 %
Eosinophils Absolute: 0.1 10*3/uL (ref 0.0–0.7)
HCT: 34.8 % — ABNORMAL LOW (ref 36.0–46.0)
Hemoglobin: 11.5 g/dL — ABNORMAL LOW (ref 12.0–15.0)
LYMPHS PCT: 34 %
Lymphs Abs: 1.1 10*3/uL (ref 0.7–4.0)
MCH: 28.3 pg (ref 26.0–34.0)
MCHC: 33 g/dL (ref 30.0–36.0)
MCV: 85.7 fL (ref 78.0–100.0)
MONO ABS: 0.3 10*3/uL (ref 0.1–1.0)
Monocytes Relative: 9 %
Neutro Abs: 1.8 10*3/uL (ref 1.7–7.7)
Neutrophils Relative %: 54 %
PLATELETS: 312 10*3/uL (ref 150–400)
RBC: 4.06 MIL/uL (ref 3.87–5.11)
RDW: 15.7 % — AB (ref 11.5–15.5)
WBC: 3.3 10*3/uL — ABNORMAL LOW (ref 4.0–10.5)

## 2016-12-15 LAB — BASIC METABOLIC PANEL
Anion gap: 8 (ref 5–15)
BUN: 20 mg/dL (ref 6–20)
CALCIUM: 9.5 mg/dL (ref 8.9–10.3)
CO2: 29 mmol/L (ref 22–32)
CREATININE: 0.93 mg/dL (ref 0.44–1.00)
Chloride: 97 mmol/L — ABNORMAL LOW (ref 101–111)
GFR calc non Af Amer: 54 mL/min — ABNORMAL LOW (ref >60)
Glucose, Bld: 112 mg/dL — ABNORMAL HIGH (ref 65–99)
Potassium: 3.4 mmol/L — ABNORMAL LOW (ref 3.5–5.1)
SODIUM: 134 mmol/L — AB (ref 135–145)

## 2016-12-15 LAB — CULTURE, BLOOD (ROUTINE X 2)
CULTURE: NO GROWTH
Culture: NO GROWTH

## 2016-12-15 LAB — BRAIN NATRIURETIC PEPTIDE: B NATRIURETIC PEPTIDE 5: 34 pg/mL (ref 0.0–100.0)

## 2016-12-15 LAB — ANTINUCLEAR ANTIBODIES, IFA: ANTINUCLEAR ANTIBODIES, IFA: POSITIVE — AB

## 2016-12-15 LAB — FANA STAINING PATTERNS
Homogeneous Pattern: 1:320 {titer} — ABNORMAL HIGH
Speckled Pattern: 1:160 {titer} — ABNORMAL HIGH

## 2016-12-15 LAB — ANTI-SCLERODERMA ANTIBODY: Scleroderma (Scl-70) (ENA) Antibody, IgG: <0.2 AI (ref 0.0–0.9)

## 2016-12-15 LAB — TROPONIN I: Troponin I: <0.03 ng/mL (ref <0.03)

## 2016-12-15 NOTE — ED Triage Notes (Signed)
EMS reports pt has history of cancer in chest wall that was treated with radiation.  Reports was admitted at [facility] for several days last week for CHF.  Reports she came home 4 days ago and has been resting but did more activity than usual yesterday and c/o sob today.  EMS also reports bp 200/80

## 2016-12-15 NOTE — Discharge Instructions (Signed)
Take your medicines when he get home and take an extra dose of potassium today. Follow-up on the third as planned or sooner if symptoms worsen.

## 2016-12-15 NOTE — ED Provider Notes (Signed)
[facility] DEPT Provider Note   CSN: [id number] Arrival date & time: 12/15/16  0720     History   Chief Complaint Chief Complaint  Patient presents with  . Shortness of Breath    HPI Charlotte [name] is a 82 y.o. female.  HPI Patient Presents with shortness of breath. Discharge from hospital 3 days ago after admission for CHF and pulmonary hypertension. Had been started on Lasix. States she been doing okay at home but he been somewhat fatigued. She's feeling worse today. She was feeling well enough yesterday that she was able to eat dinner with family members. States she had [place] and gravy and green beans along with some redness okay for dessert. Today she feels more shortness of breath. States it is not as bad as when she was admitted the hospital but is worse and she had been. No chest pain. No cough. No trouble breathing. No swelling or legs. She continues to take her medicines. Reportedly high blood pressure of [id number] systolic Past Medical History:  Diagnosis Date  . Anxiety   . Arthritis   . Cancer [facility])    breast cancer  . Fatigue   . History of hyperthyroidism    resolved, no medicaions for treatment at this time  . Hypertension   . Insomnia   . OSA (obstructive sleep apnea)     Patient Active Problem List   Diagnosis Date Noted  . Acute on chronic combined systolic and diastolic congestive heart failure ([place])   . Acute respiratory failure ([place]) 12/10/2016  . SOB (shortness of breath)   . Community acquired pneumonia 12/09/2016  . Acute respiratory failure with hypoxia ([place]) 12/09/2016  . Essential hypertension 12/09/2016  . 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 Chronic insomnia 11/04/2016  . 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 OSA (obstructive sleep apnea) 11/04/2016  . Snoring 11/04/2016  . Fatigue due to treatment 11/04/2016  . Malignant neoplasm of upper-inner quadrant of left female breast ([place]) 07/01/2016  . Generalized  anxiety disorder 01/28/2016  . SHOULDER, ARTHRITIS, DEGEN./OSTEO 12/23/2009  . IMPINGEMENT SYNDROME 12/23/2009  . HIGH BLOOD PRESSURE 12/23/2009    Past Surgical History:  Procedure Laterality Date  . BREAST LUMPECTOMY     right side  . BREAST LUMPECTOMY WITH AXILLARY LYMPH NODE BIOPSY Left 06/17/2016   Procedure: BREAST LUMPECTOMY WITH AXILLARY LYMPH NODE BIOPSY;  Surgeon: Autumn Messing III, MD;  Location: [place];  Service: General;  Laterality: Left;  . COLONOSCOPY    . DILATION AND CURETTAGE OF UTERUS    . EYE SURGERY     bilateral cataracts removed  . FOOT SURGERY    . PORTACATH PLACEMENT Right 07/16/2016   Procedure: INSERTION PORT-A-CATH RIGHT SUBLCLAVIAN;  Surgeon: Autumn Messing III, MD;  Location: [facility];  Service: General;  Laterality: Right;  . TONSILLECTOMY      OB History    Gravida Para Term Preterm AB Living   2 2 2     2    SAB TAB Ectopic Multiple Live Births                   Home Medications    Prior to Admission medications   Medication Sig Start Date End Date Taking? Authorizing Provider  anastrozole (ARIMIDEX) 1 MG tablet Take 1 tablet (1 mg total) by mouth daily. 10/13/16   Nicholas Lose, MD  aspirin EC 81 MG tablet Take 81 mg by mouth daily.    Historical Provider, MD  Calcium Carbonate-Vitamin D (  CALCIUM 600+D3 PO) Take 1 tablet by mouth 2 (two) times daily.    Historical Provider, MD  carbamide peroxide (DEBROX) 6.5 % otic solution Place 1 drop into both ears daily as needed (ear wax buildup).    Historical Provider, MD  cycloSPORINE (RESTASIS) 0.05 % ophthalmic emulsion Place 1 drop into both eyes 2 (two) times daily.    Historical Provider, MD  felodipine (PLENDIL) 5 MG 24 hr tablet Take 5 mg by mouth daily.     Historical Provider, MD  fluticasone (FLONASE) 50 MCG/ACT nasal spray Place 2 sprays into the nose daily as needed for allergies.    Historical Provider, MD  furosemide (LASIX) 40 MG tablet Take 0.5 tablets (20 mg total) by mouth daily. 12/13/16   Dron  Tanna Furry, MD  hydrALAZINE (APRESOLINE) 50 MG tablet Take 1 tablet (50 mg total) by mouth 2 (two) times daily. 06/01/16   Lendon Colonel, NP  [facility] PAIN RELIEF PATCH) PADS Apply 1 each topically daily as needed (pain).    Historical Provider, MD  LORazepam (ATIVAN) 1 MG tablet Take 0.5 mg by mouth every 8 (eight) hours as needed for anxiety or sleep.     Historical Provider, MD  losartan (COZAAR) 100 MG tablet Take 100 mg by mouth daily.    Historical Provider, MD  metoprolol succinate (TOPROL XL) 25 MG 24 hr tablet Take 0.5 tablets (12.5 mg total) by mouth daily. 12/09/16   Arnoldo Lenis, MD  Multiple Vitamins-Minerals (CENTRUM ADULTS PO) Take 1 tablet by mouth daily.    Historical Provider, MD  non-metallic deodorant Jethro Poling) MISC Apply 1 application topically daily as needed.    Historical Provider, MD  Omega-3 Fatty Acids (FISH OIL TRIPLE STRENGTH) 1400 MG CAPS Take 1 tablet by mouth daily.    Historical Provider, MD  Polyethyl Glycol-Propyl Glycol (SYSTANE) 0.4-0.3 % SOLN Apply 1 drop to eye 2 (two) times daily.    Historical Provider, MD  potassium chloride SA (K-DUR,KLOR-CON) 20 MEQ tablet Take 1 tablet (20 mEq total) by mouth daily. 12/13/16   Dron Tanna Furry, MD  tretinoin (RETIN-A) 0.025 % cream Apply 1 application topically at bedtime.  06/27/15   Historical Provider, MD    Family History Family History  Problem Relation Age of Onset  . Heart attack Mother   . Cancer Mother   . Hypertension Other   . Lung disease Neg Hx   . Rheumatologic disease Neg Hx     Social History Social History  Substance Use Topics  . Smoking status: Passive Smoke Exposure - Never Smoker  . Smokeless tobacco: Never Used     Comment: Husband smoked  . Alcohol use No     Allergies   Latex; Bextra [valdecoxib]; and Motrin [ibuprofen]   Review of Systems Review of Systems  Constitutional: Positive for fatigue. Negative for appetite change.  HENT: Negative for  congestion.   Respiratory: Positive for shortness of breath. Negative for cough.   Cardiovascular: Negative for chest pain and leg swelling.  Gastrointestinal: Negative for abdominal pain.  Genitourinary: Negative for dyspareunia and flank pain.  Musculoskeletal: Negative for back pain.  Neurological: Positive for weakness.  Psychiatric/Behavioral: Negative for confusion.     Physical Exam Updated Vital Signs BP 172/59 (BP Location: Left Arm)   Pulse (!) 59   Temp 98.2 F (36.8 C) (Oral)   Resp 18   Ht 5\' 2"  (1.575 m)   Wt 127 lb (57.6 kg)   SpO2 96%   BMI  23.23 kg/m   Physical Exam  Constitutional: She appears well-developed.  HENT:  Head: Atraumatic.  Eyes: Pupils are equal, round, and reactive to light.  Neck: Neck supple. No JVD present.  Cardiovascular: Normal rate.   Pulmonary/Chest: Effort normal. She has no wheezes. She has no rales.  Abdominal: There is no tenderness.  Musculoskeletal: She exhibits no edema.  Neurological: She is alert.  Skin: Skin is warm. Capillary refill takes less than 2 seconds.  Psychiatric: She has a normal mood and affect.     ED Treatments / Results  Labs (all labs ordered are listed, but only abnormal results are displayed) Labs Reviewed  BASIC METABOLIC PANEL - Abnormal; Notable for the following:       Result Value   Sodium 134 (*)    Potassium 3.4 (*)    Chloride 97 (*)    Glucose, Bld 112 (*)    GFR calc non Af Amer 54 (*)    All other components within normal limits  CBC WITH DIFFERENTIAL/PLATELET - Abnormal; Notable for the following:    WBC 3.3 (*)    Hemoglobin 11.5 (*)    HCT 34.8 (*)    RDW 15.7 (*)    All other components within normal limits  TROPONIN I  BRAIN NATRIURETIC PEPTIDE    EKG  EKG Interpretation  Date/Time:  Tuesday December 15 2016 07:27:00 EST Ventricular Rate:  57 PR Interval:    QRS Duration: 111 QT Interval:  386 QTC Calculation: 376 R Axis:   -36 Text Interpretation:  Sinus rhythm  LVH with IVCD, LAD and secondary repol abnrm Anterior ST elevation, probably due to LVH No significant change since last tracing Confirmed by Alvino Chapel  MD, Sparrow Sanzo [phone]) on 12/15/2016 7:45:56 AM       Radiology Dg Chest 2 View  Result Date: 12/15/2016 CLINICAL DATA:  Shortness of breath for 1 week. EXAM: CHEST  2 VIEW COMPARISON:  PA and lateral chest 12/12/2016 and 05/19/2016. CT chest 12/10/2016. FINDINGS: Airspace disease in the left chest persists without change. Small focus of airspace opacity is seen in the periphery of the right lower lobe. Small left effusion is noted. Heart size is enlarged. Port-A-Cath is in place. Aortic atherosclerosis is identified. IMPRESSION: No change in airspace disease in the left chest. New small focus of airspace opacity in the periphery of the right lower lobe could be due to atelectasis or infection. Cardiomegaly without edema. Atherosclerosis. Electronically Signed   By: Inge Rise M.D.   On: 12/15/2016 08:07    Procedures Procedures (including critical care time)  Medications Ordered in ED Medications - No data to display   Initial Impression / Assessment and Plan / ED Course  I have reviewed the triage vital signs and the nursing notes.  Pertinent labs & imaging results that were available during my care of the patient were reviewed by me and considered in my medical decision making (see chart for details).  Clinical Course     Patient shortness of breath. Not hypoxic. Initially had some hypertension at home that has resolved somewhat still elevated here. Has not had her medicines yet today. BNP is reassuring. Mild hyponatremia and hypokalemia. Will continue patient's medicines. Will have follow-up as needed. Ambulated without hypoxia. Over and wasn't considered and felt less likely.  Final Clinical Impressions(s) / ED Diagnoses   Final diagnoses:  Dyspnea, unspecified type  Hypokalemia    New Prescriptions New Prescriptions   No  medications on file  Davonna Belling, MD 12/15/16 1007

## 2016-12-23 ENCOUNTER — Encounter: Payer: Self-pay | Admitting: Acute Care

## 2016-12-23 ENCOUNTER — Ambulatory Visit (INDEPENDENT_AMBULATORY_CARE_PROVIDER_SITE_OTHER): Payer: Medicare Other | Admitting: Acute Care

## 2016-12-23 VITALS — BP 124/68 | HR 62 | Ht 62.0 in | Wt 128.0 lb

## 2016-12-23 DIAGNOSIS — R5383 Other fatigue: Secondary | ICD-10-CM

## 2016-12-23 DIAGNOSIS — I5043 Acute on chronic combined systolic (congestive) and diastolic (congestive) heart failure: Secondary | ICD-10-CM

## 2016-12-23 DIAGNOSIS — G4733 Obstructive sleep apnea (adult) (pediatric): Secondary | ICD-10-CM | POA: Diagnosis not present

## 2016-12-23 DIAGNOSIS — R5381 Other malaise: Secondary | ICD-10-CM | POA: Diagnosis not present

## 2016-12-23 DIAGNOSIS — R0602 Shortness of breath: Secondary | ICD-10-CM | POA: Diagnosis not present

## 2016-12-23 DIAGNOSIS — J90 Pleural effusion, not elsewhere classified: Secondary | ICD-10-CM | POA: Insufficient documentation

## 2016-12-23 DIAGNOSIS — M1990 Unspecified osteoarthritis, unspecified site: Secondary | ICD-10-CM

## 2016-12-23 DIAGNOSIS — I272 Pulmonary hypertension, unspecified: Secondary | ICD-10-CM

## 2016-12-23 NOTE — Progress Notes (Signed)
History of Present Illness Charlotte [name] is a 83 y.o. female with history of left breast cancer post lumpectomy ( 05/2016)and radiation therapy ( 07/2016), history of treatment with anastrozole, with previous treatment with Herceptin , seen as by Dr. Ashok Cordia for pulmonary consult for dyspnea during hospitalization 12/20-12/22/2018. She is a never smoker, but + passive smoke exposure.( Husband was a smoker)  Synopsis: 83 y.o. female with a complicated medical history including history of left  breast cancer post lumpectomy on anastrozole and also previously treated with Herceptin with evidence of combined systolic and diastolic congestive heart failure on echocardiogram ( 11/24/2016) presents with dyspnea 12/09/16, with admission through 12/11/2016. Echocardiogram also suggests pulmonary hypertension. Followed outpatient by cardiology. PCCM asked to consult regarding pulmonary hypertension & the possibility of underlying lung disease contributing to patient's symptoms. She was seen by Dr. Ashok Cordia as an inpatient.   12/23/2016 Hospital Follow Up: Pt. Presents for hospital follow up.She was admitted from 12/20-12/22/17 with Dyspnea. She has a complicated medical history which includes  hypertension, insomnia, and hyperthyroidism as well as arthritis. She had lumpectomy of Left  Breast 05/2016,  with follow up treatment with Herceptin per oncology. She experienced decline in EF per echo, and treatment was subsequently stopped.She underwent radiation therapy Aug-Sept, 2017. Patient had noticed increasing dyspnea, primarily with exertion, since August/September radiation treatment which progressively worsened with significant fatigue prompting her presentation to the emergency department on 12/20. She also reported mild, intermittent cough that was non-productive. The patient was treated with Lasix for a small to moderate free flowing left effusion. Patient was empirically started on antibiotics for possible  pneumonia on admission.Suspect  Combined systolic/diastolic CHF exacerbation was cause of symptoms on admission. Antibiotics were d/c'd per Pulmonary. She was discharged with plan to follow up as an outpatient for further evaluation of Suspected  Pulmonary Hypertension, OSA, and possible contributing underlying pulmonary parenchymal process that may be contributing to dyspnea..  She presents today stating she is feeling better. She states she  is doing well. She states her breathing is good. She has no pain, no shortness of breath today. She states that she does have some shortness of breath with anxiety.She is taking Ativan per her PCP for the anxiety. Her daughter is concerned about the Ativan use and is going to talk to the prescribing MD about it. Per the patient, while she is better, she is not back to her pre-hospitalization baseline. She continues to have fatigue, and is deconditioned. She had an ED visit 12/15/16 for shortness of breath and elevated BP. She was not hypoxic, and had not taken her blood pressure medications. BNP at the time was 34, WBC was 3.3. She states she is compliant with her lasix, potassium and HTN medications. She denies fever,chills or sweats, chest pain,orthopnea or hemoptysis. She denies dysphagia, reflux or dyspepsia.Pt. Is requesting we defer on today's CXR as she had CXR 12/15/2016 in ED.  CXR 12/15/16 : FINDINGS: Airspace disease in the left chest persists without change. Small focus of airspace opacity is seen in the periphery of the right lower lobe. Small left effusion is noted. Heart size is enlarged. Port-A-Cath is in place. Aortic atherosclerosis is identified. IMPRESSION: No change in airspace disease in the left chest. New small focus of airspace opacity in the periphery of the right lower lobe could be due to atelectasis or infection.  Cardiomegaly without edema.  STUDIES:  ABG on RA (11/04/15):  7.649 / 22.8 / 117 / 99% TTE (11/17/16): LV normal  in  size with moderate LVH. EF 50%. Grade 2 diastolic dysfunction. LA moderately dilated & RA normal in size. RV normal in size and function. Pulmonary artery systolic pressure 64 mmHg.  Previous ejection fraction 60-65% in July 2017 with grade 1 diastolic dysfunction, preserved right ventricular size/function, & pulmonary artery systolic pressure 51 mmHg. POLYSOMNOGRAM (11/25/16): Total AHI 16.1 events/hour with non-REM AHI 19.4 events/hour. Baseline saturation 96% with low saturation 75% CTA CHEST (12/10/16):   Small pericardial effusion. Main pulmonary artery 3.4 cm in diameter.Wedge-shaped consolidation with air bronchograms within left upper lobe/lingula which was not present on June 2017 CT imaging.  Previous chest x-ray imaging does suggest there may have been a developing consolidation in the same area starting in July 2017 but obviously more progressive with recent x-ray and CT imaging. Lower lung intralobular septal thickening that could represent pulmonary edema. This was minimal if at all present on previous CT imaging in June 2017. Small to moderate free-flowing left pleural effusion.  MICROBIOLOGY: Blood Ctx x2 12/20 >> Urine Streptococcal Ag 12/21:  Negative  Respiratory Viral Panel PCR 12/21:  Negative   12/12/2016 Labwork: Sed Rate: 20 CRP: < 0.8 ANA : Positive Anti CCP/ IgG/IgA : 14 Rheumatoid Factor: < 10.0 FANA Staining Pattern: Homogenous 1:320                                        Speckled 1:162  Ambulatory Oxygen Saturations 12/12/2016: 96-98% on room air.   Daughters Contact Numbers [phone] [facility]) [phone] ( Cell)   Past medical hx Past Medical History:  Diagnosis Date  . Anxiety   . Arthritis   . Cancer [facility])    breast cancer  . Fatigue   . History of hyperthyroidism    resolved, no medicaions for treatment at this time  . Hypertension   . Insomnia   . OSA (obstructive sleep apnea)      Past surgical hx, Family hx, Social hx all  reviewed.  Current Outpatient Prescriptions on File Prior to Visit  Medication Sig  . aspirin EC 81 MG tablet Take 81 mg by mouth daily.  . Calcium Carbonate-Vitamin D (CALCIUM 600+D3 PO) Take 1 tablet by mouth 2 (two) times daily.  . carbamide peroxide (DEBROX) 6.5 % otic solution Place 1 drop into both ears daily as needed (ear wax buildup).  . cycloSPORINE (RESTASIS) 0.05 % ophthalmic emulsion Place 1 drop into both eyes 2 (two) times daily.  . felodipine (PLENDIL) 5 MG 24 hr tablet Take 5 mg by mouth daily.   . fluticasone (FLONASE) 50 MCG/ACT nasal spray Place 2 sprays into the nose daily as needed for allergies.  . furosemide (LASIX) 40 MG tablet Take 0.5 tablets (20 mg total) by mouth daily.  . hydrALAZINE (APRESOLINE) 50 MG tablet Take 1 tablet (50 mg total) by mouth 2 (two) times daily.  . Liniments (SALONPAS PAIN RELIEF PATCH) PADS Apply 1 each topically daily as needed (pain).  . LORazepam (ATIVAN) 1 MG tablet Take 0.5 mg by mouth every 8 (eight) hours as needed for anxiety or sleep.   Marland Kitchen losartan (COZAAR) 100 MG tablet Take 100 mg by mouth daily.  . metoprolol succinate (TOPROL XL) 25 MG 24 hr tablet Take 0.5 tablets (12.5 mg total) by mouth daily.  . Multiple Vitamins-Minerals (CENTRUM ADULTS PO) Take 1 tablet by mouth daily.  . non-metallic deodorant Jethro Poling) MISC Apply 1 application  topically daily as needed.  . Omega-3 Fatty Acids (FISH OIL TRIPLE STRENGTH) 1400 MG CAPS Take 1 tablet by mouth daily.  Vladimir Faster Glycol-Propyl Glycol (SYSTANE) 0.4-0.3 % SOLN Apply 1 drop to eye 2 (two) times daily.  . potassium chloride SA (K-DUR,KLOR-CON) 20 MEQ tablet Take 1 tablet (20 mEq total) by mouth daily.  Marland Kitchen tretinoin (RETIN-A) 0.025 % cream Apply 1 application topically at bedtime.   Marland Kitchen anastrozole (ARIMIDEX) 1 MG tablet Take 1 tablet (1 mg total) by mouth daily. (Patient not taking: Reported on 12/23/2016)   No current facility-administered medications on file prior to visit.       Allergies  Allergen Reactions  . Latex Swelling    SEVERITY OF SWELLING NOT DEFINED.  Marland Kitchen Bextra [Valdecoxib] Other (See Comments)    Stomach Pains  . Motrin [Ibuprofen] Other (See Comments)    Stomach pain    Review Of Systems:  Constitutional:   No  weight loss, night sweats,  Fevers, chills, +fatigue, or  lassitude.  HEENT:   No headaches,  Difficulty swallowing,  Tooth/dental problems, or  Sore throat,                No sneezing, itching, ear ache, nasal congestion, post nasal drip,   CV:  No chest pain,  Orthopnea, PND, swelling in lower extremities, anasarca, dizziness, palpitations, syncope.   GI  No heartburn, indigestion, abdominal pain, nausea, vomiting, diarrhea, change in bowel habits, loss of appetite, bloody stools.   Resp: No shortness of breath with exertion or at rest.  No excess mucus, no productive cough,  No non-productive cough,  No coughing up of blood.  No change in color of mucus.  No wheezing.  No chest wall deformity  Skin: no rash or lesions.  GU: no dysuria, change in color of urine, no urgency or frequency.  No flank pain, no hematuria   MS:  No joint pain or swelling.  No decreased range of motion.  No back pain.  Psych:  No change in mood or affect. No depression or anxiety.  No memory loss.   Vital Signs BP 124/68 (BP Location: Right Arm, Patient Position: Sitting, Cuff Size: Normal)   Pulse 62   Ht 5\' 2"  (1.575 m)   Wt 128 lb (58.1 kg)   SpO2 96%   BMI 23.41 kg/m    Physical Exam:  General- No distress,  A&Ox3, frail, deconditioned pleasant female ENT: No sinus tenderness, TM clear, pale nasal mucosa, no oral exudate,no post nasal drip, no LAN Cardiac: S1, S2, regular rate and rhythm, no murmur Chest: No wheeze/ rales/ dullness; no accessory muscle use, no nasal flaring, no sternal retractions, diminished per bases. Abd.: Soft Non-tender, flat Ext: No clubbing cyanosis, edema Neuro:  Deconditioned, wheelchair bound, MAE x 4 Skin:  No rashes, warm and dry Psych: normal mood and behavior   Assessment/Plan  SOB (shortness of breath) Lung Opacity on Imaging Study left upper lobe/lingula with unclear etiology. Follow Up CXR PA/Lat 12/15/16  No change in airspace disease in the left chest. New small focus of airspace opacity in the periphery of the right lower lobe could be due to atelectasis or infection.  Pt. Understands etiology could be progression of her malignancy rather than radiation pneumonitis. She has improved since discharge, saturations are 96% in the office today on RA. No Dyspnea. Plan: Continue Lasix as ordered. Will discuss with Dr. Ashok Cordia. If need for further evaluation once euvolemic, patient states she does want to be aggressive,  and would undergo a bronchoscopy if needed.  Follow up appointment with Dr. Ashok Cordia in 8 weeks or sooner as needed. Please contact office for sooner follow up if symptoms do not improve or worsen or seek emergency care   OSA (obstructive sleep apnea) Pt. Is scheduled for outpatient CPAP Titration through [facility] 12/29/2016. Follow up with Dr. Ashok Cordia in 8 weeks.  Fatigue due to treatment Deconditioned secondary to surgery and follow up chemo radiation Combined systolic/ diastolic CHF Plan: Referral to Physical Therapy in outpatient setting/ or home.  Acute on chronic combined systolic and diastolic congestive heart failure (HCC) Combined Systolic Diastolic CHF exacerbation resulting in Dyspnea and inpatient admission Plan: Continue lasix daily as renal function allows, and blood pressure tolerates ASA 81 mg daily, Toprol XL daily, Losartan daily, hydralazine BID. Follow up with Dr. Ashok Cordia in 2 months Follow up with Cardiology 01/14/2017 as scheduled Please contact office for sooner follow up if symptoms do not improve or worsen or seek emergency care   Arthritis Labs evaluated 12/12/16 Plan: Consider follow up with Rheumatology.    Magdalen Spatz,  NP 12/23/2016  11:16 PM

## 2016-12-23 NOTE — Assessment & Plan Note (Addendum)
Deconditioned secondary to surgery and follow up chemo radiation Combined systolic/ diastolic CHF Plan: Referral to Physical Therapy in outpatient setting/ or home.

## 2016-12-23 NOTE — Assessment & Plan Note (Signed)
Combined Systolic Diastolic CHF exacerbation resulting in Dyspnea and inpatient admission Plan: Continue lasix daily as renal function allows, and blood pressure tolerates ASA 81 mg daily, Toprol XL daily, Losartan daily, hydralazine BID. Follow up with Dr. Ashok Cordia in 2 months Follow up with Cardiology 01/14/2017 as scheduled Please contact office for sooner follow up if symptoms do not improve or worsen or seek emergency care

## 2016-12-23 NOTE — Patient Instructions (Addendum)
It is nice to meet you today. We will send a referral for physical therapy today. We will defer on CXR today as you just had one on 12/15/16. CPAP Titration as scheduled for 12/29/2016 We will schedule PFT's when you are back to your baseline strength. Follow up with Dr. Ashok Cordia in 2 months to determine need for bronchoscopy and heart cath in the future Please make a followp appointment with your Primary Care doctor. Please contact office for sooner follow up if symptoms do not improve or worsen or seek emergency care

## 2016-12-23 NOTE — Assessment & Plan Note (Signed)
Pt. Is scheduled for outpatient CPAP Titration through [facility] 12/29/2016. Follow up with Dr. Ashok Cordia in 8 weeks.

## 2016-12-23 NOTE — Assessment & Plan Note (Addendum)
Labs evaluated 12/12/16 Plan: Consider follow up with Rheumatology.

## 2016-12-23 NOTE — Assessment & Plan Note (Addendum)
Lung Opacity on Imaging Study left upper lobe/lingula with unclear etiology. Follow Up CXR PA/Lat 12/15/16  No change in airspace disease in the left chest. New small focus of airspace opacity in the periphery of the right lower lobe could be due to atelectasis or infection.  Pt. Understands etiology could be progression of her malignancy rather than radiation pneumonitis. She has improved since discharge, saturations are 96% in the office today on RA. No Dyspnea. Plan: Continue Lasix as ordered. Will discuss with Dr. Ashok Cordia. If need for further evaluation once euvolemic, patient states she does want to be aggressive, and would undergo a bronchoscopy if needed.  Follow up appointment with Dr. Ashok Cordia in 8 weeks or sooner as needed. Please contact office for sooner follow up if symptoms do not improve or worsen or seek emergency care

## 2016-12-24 ENCOUNTER — Telehealth: Payer: Self-pay | Admitting: Acute Care

## 2016-12-24 NOTE — Telephone Encounter (Signed)
Called to discuss plan of care with patient's daughter. There was no answer. I have left a message and contact information. We will await a return call.

## 2016-12-25 ENCOUNTER — Other Ambulatory Visit: Payer: Self-pay | Admitting: Acute Care

## 2016-12-25 DIAGNOSIS — Z9221 Personal history of antineoplastic chemotherapy: Secondary | ICD-10-CM | POA: Diagnosis not present

## 2016-12-25 DIAGNOSIS — Z853 Personal history of malignant neoplasm of breast: Secondary | ICD-10-CM | POA: Diagnosis not present

## 2016-12-25 DIAGNOSIS — J90 Pleural effusion, not elsewhere classified: Secondary | ICD-10-CM

## 2016-12-25 DIAGNOSIS — I272 Pulmonary hypertension, unspecified: Secondary | ICD-10-CM | POA: Diagnosis not present

## 2016-12-25 DIAGNOSIS — M6281 Muscle weakness (generalized): Secondary | ICD-10-CM | POA: Diagnosis not present

## 2016-12-25 DIAGNOSIS — I5043 Acute on chronic combined systolic (congestive) and diastolic (congestive) heart failure: Secondary | ICD-10-CM | POA: Diagnosis not present

## 2016-12-28 ENCOUNTER — Telehealth: Payer: Self-pay | Admitting: Acute Care

## 2016-12-28 NOTE — Telephone Encounter (Signed)
atc Kristin at [facility], office is currently closed. wcb

## 2016-12-29 ENCOUNTER — Ambulatory Visit (INDEPENDENT_AMBULATORY_CARE_PROVIDER_SITE_OTHER): Payer: Medicare Other | Admitting: Neurology

## 2016-12-29 DIAGNOSIS — G4733 Obstructive sleep apnea (adult) (pediatric): Secondary | ICD-10-CM

## 2016-12-30 DIAGNOSIS — I272 Pulmonary hypertension, unspecified: Secondary | ICD-10-CM | POA: Diagnosis not present

## 2016-12-30 DIAGNOSIS — Z853 Personal history of malignant neoplasm of breast: Secondary | ICD-10-CM | POA: Diagnosis not present

## 2016-12-30 DIAGNOSIS — Z9221 Personal history of antineoplastic chemotherapy: Secondary | ICD-10-CM | POA: Diagnosis not present

## 2016-12-30 DIAGNOSIS — M6281 Muscle weakness (generalized): Secondary | ICD-10-CM | POA: Diagnosis not present

## 2016-12-30 DIAGNOSIS — I5043 Acute on chronic combined systolic (congestive) and diastolic (congestive) heart failure: Secondary | ICD-10-CM | POA: Diagnosis not present

## 2016-12-30 NOTE — Telephone Encounter (Signed)
Yes, this is fine. Please place the order for PT. Thanks

## 2016-12-30 NOTE — Telephone Encounter (Signed)
Erasmo Downer with [facility] returned call, CB [phone].

## 2016-12-30 NOTE — Telephone Encounter (Signed)
Spoke with Erasmo Downer at [facility], wanting to to PT 2X weekly X6 weeks for physical strengthening for pt.  Requesting VO for this.   Pt has only been seen in clinic once by SG.  SG please advise if ok to order PT for pt.  Thanks!

## 2016-12-30 NOTE — Telephone Encounter (Signed)
lmtcb for [place] at [facility]

## 2016-12-30 NOTE — Telephone Encounter (Signed)
Spoke with Charlotte [name] at [facility], gave VO for PT.  Nothing further needed.

## 2017-01-01 ENCOUNTER — Telehealth: Payer: Self-pay | Admitting: Pulmonary Disease

## 2017-01-01 DIAGNOSIS — I5043 Acute on chronic combined systolic (congestive) and diastolic (congestive) heart failure: Secondary | ICD-10-CM | POA: Diagnosis not present

## 2017-01-01 DIAGNOSIS — Z9221 Personal history of antineoplastic chemotherapy: Secondary | ICD-10-CM | POA: Diagnosis not present

## 2017-01-01 DIAGNOSIS — I272 Pulmonary hypertension, unspecified: Secondary | ICD-10-CM | POA: Diagnosis not present

## 2017-01-01 DIAGNOSIS — Z853 Personal history of malignant neoplasm of breast: Secondary | ICD-10-CM | POA: Diagnosis not present

## 2017-01-01 DIAGNOSIS — M6281 Muscle weakness (generalized): Secondary | ICD-10-CM | POA: Diagnosis not present

## 2017-01-01 NOTE — Telephone Encounter (Signed)
lmtcb x1 for Kristen. 

## 2017-01-04 ENCOUNTER — Ambulatory Visit (HOSPITAL_COMMUNITY)
Admission: RE | Admit: 2017-01-04 | Discharge: 2017-01-04 | Disposition: A | Discharge status: Home / Self Care | Payer: Medicare Other | Source: Ambulatory Visit | Attending: Acute Care | Admitting: Acute Care

## 2017-01-04 DIAGNOSIS — J90 Pleural effusion, not elsewhere classified: Secondary | ICD-10-CM | POA: Insufficient documentation

## 2017-01-04 NOTE — Assessment & Plan Note (Signed)
Left lumpectomy 06/17/2016: IDC grade 2, 2.4 cm, with intermediate grade DCIS, LVID present, margins negative, 0/4 lymph nodes positive, ER 60%, PR 0%, HER-2 negative ratio 1.42, Ki-67 15%, T2 N0 stage II a Adjuvant radiation therapy started 07/17/2016 completed 08/14/2016  Treatment plan: Adjuvant Herceptin every 3 weeks started 08/01/2017for 1 year with anastrozole started 09/01/2016 Patient's cardiologist Dr. Carlyle Dolly to do her echocardiograms every 3 months for 1 year. -------------------------------------------------------------------------------------------------------------------------------- Current treatment: Herceptin every 3 weeks + anastrozole 1 mg daily started 10/13/2016 Monitoringclosely for toxicities. Echocardiogram showed an ejection fraction of 50% on 11/17/2016  Hospitalization Dec 2017 for SOB ? CHF vs Pneumonia Patient sees Dr.Branch in [place].  Plan to hold off on Herceptin until cleared by cardiology

## 2017-01-04 NOTE — Telephone Encounter (Signed)
Called and spoke with Erasmo Downer and she is aware of CY recs at this time for the pt.  Nothing further is needed.

## 2017-01-04 NOTE — Telephone Encounter (Signed)
Ok to wait on buying more potassium until seen by Dr Ashok Cordia. Recommend eating potassium-rish food until that appointment- daily banana, spinach, or orange juice

## 2017-01-04 NOTE — Telephone Encounter (Signed)
Spoke with Cyril Mourning, pt had been given Potassium and Lasix while at the ED  Pt wanting to know if this is something that she needs to stay on or can this be d/c'd? Pt is completely out of the Potassium but if still taking the Lasix.  Pt has an appt with Dr Ashok Cordia 01/06/17 Cyril Mourning wants to know if she needs to refill the Potassium or just take the Lasix alone until she comes in this week. Below is a portion of the discharge summary which stated the recommendation of taking the Lasix and Potaasium - please advise Dr Annamaria Boots as Dr Ashok Cordia is off if this needs to be refilled at this time or if you feel it can wait until her appt with JN this week.   #Shortness of breath and dyspnea on exertion possibly due to acute on chronic combined heart failure with mild left ventricular dysfunction, moderate diastolic dysfunction and pulmonary hypertension  -The patient was treated with Lasix with improvement in symptoms today. Clinically feels better today. Evaluated by both pulmonologist and cardiologist. Repeated chest x-ray today with no significant change. Pulmonary recommended outpatient follow-up for pulmonary function test and repeat x-ray. Follow-up appointment already made. Cardiologist also recommended to discharge with oral Lasix and potassium chloride with outpatient follow-up.  Please advise Dr Annamaria Boots. Thanks.   Allergies as of 01/01/2017      Reactions   Latex Swelling   SEVERITY OF SWELLING NOT DEFINED.   Bextra [valdecoxib] Other (See Comments)   Stomach Pains   Motrin [ibuprofen] Other (See Comments)   Stomach pain      Medication List       Accurate as of 01/01/17 11:59 PM. Always use your most recent med list.          anastrozole 1 MG tablet Commonly known as:  ARIMIDEX Take 1 tablet (1 mg total) by mouth daily.   aspirin EC 81 MG tablet Take 81 mg by mouth daily.   CALCIUM 600+D3 PO Take 1 tablet by mouth 2 (two) times daily.   carbamide peroxide 6.5 % otic solution Commonly  known as:  DEBROX Place 1 drop into both ears daily as needed (ear wax buildup).   CENTRUM ADULTS PO Take 1 tablet by mouth daily.   cycloSPORINE 0.05 % ophthalmic emulsion Commonly known as:  RESTASIS Place 1 drop into both eyes 2 (two) times daily.   felodipine 5 MG 24 hr tablet Commonly known as:  PLENDIL Take 5 mg by mouth daily.   FISH OIL TRIPLE STRENGTH 1400 MG Caps Take 1 tablet by mouth daily.   fluticasone 50 MCG/ACT nasal spray Commonly known as:  FLONASE Place 2 sprays into the nose daily as needed for allergies.   furosemide 40 MG tablet Commonly known as:  LASIX Take 0.5 tablets (20 mg total) by mouth daily.   hydrALAZINE 50 MG tablet Commonly known as:  APRESOLINE Take 1 tablet (50 mg total) by mouth 2 (two) times daily.   LORazepam 1 MG tablet Commonly known as:  ATIVAN Take 0.5 mg by mouth every 8 (eight) hours as needed for anxiety or sleep.   losartan 100 MG tablet Commonly known as:  COZAAR Take 100 mg by mouth daily.   metoprolol succinate 25 MG 24 hr tablet Commonly known as:  TOPROL XL Take 0.5 tablets (12.5 mg total) by mouth daily.   non-metallic deodorant Misc Commonly known as:  ALRA Apply 1 application topically daily as needed.   potassium chloride SA 20 MEQ  tablet Commonly known as:  K-DUR,KLOR-CON Take 1 tablet (20 mEq total) by mouth daily.   SALONPAS PAIN RELIEF PATCH Pads Apply 1 each topically daily as needed (pain).   SYSTANE 0.4-0.3 % Soln Generic drug:  Polyethyl Glycol-Propyl Glycol Apply 1 drop to eye 2 (two) times daily.   tretinoin 0.025 % cream Commonly known as:  RETIN-A Apply 1 application topically at bedtime.

## 2017-01-05 ENCOUNTER — Ambulatory Visit (HOSPITAL_BASED_OUTPATIENT_CLINIC_OR_DEPARTMENT_OTHER): Payer: Medicare Other | Admitting: Hematology and Oncology

## 2017-01-05 ENCOUNTER — Telehealth: Payer: Self-pay | Admitting: Neurology

## 2017-01-05 ENCOUNTER — Ambulatory Visit: Payer: Medicare Other

## 2017-01-05 ENCOUNTER — Other Ambulatory Visit (HOSPITAL_BASED_OUTPATIENT_CLINIC_OR_DEPARTMENT_OTHER): Payer: Medicare Other

## 2017-01-05 ENCOUNTER — Encounter: Payer: Self-pay | Admitting: *Deleted

## 2017-01-05 DIAGNOSIS — C50212 Malignant neoplasm of upper-inner quadrant of left female breast: Secondary | ICD-10-CM

## 2017-01-05 DIAGNOSIS — R5383 Other fatigue: Secondary | ICD-10-CM

## 2017-01-05 DIAGNOSIS — G4733 Obstructive sleep apnea (adult) (pediatric): Secondary | ICD-10-CM

## 2017-01-05 DIAGNOSIS — Z170 Estrogen receptor positive status [ER+]: Principal | ICD-10-CM

## 2017-01-05 DIAGNOSIS — Z79811 Long term (current) use of aromatase inhibitors: Secondary | ICD-10-CM | POA: Diagnosis not present

## 2017-01-05 LAB — CBC WITH DIFFERENTIAL/PLATELET
BASO%: 0.9 % (ref 0.0–2.0)
BASOS ABS: 0 10*3/uL (ref 0.0–0.1)
EOS ABS: 0 10*3/uL (ref 0.0–0.5)
EOS%: 0.7 % (ref 0.0–7.0)
HEMATOCRIT: 36.9 % (ref 34.8–46.6)
HEMOGLOBIN: 12.1 g/dL (ref 11.6–15.9)
LYMPH#: 1.1 10*3/uL (ref 0.9–3.3)
LYMPH%: 26.5 % (ref 14.0–49.7)
MCH: 27.9 pg (ref 25.1–34.0)
MCHC: 32.7 g/dL (ref 31.5–36.0)
MCV: 85.5 fL (ref 79.5–101.0)
MONO#: 0.4 10*3/uL (ref 0.1–0.9)
MONO%: 8.9 % (ref 0.0–14.0)
NEUT#: 2.7 10*3/uL (ref 1.5–6.5)
NEUT%: 63 % (ref 38.4–76.8)
PLATELETS: 290 10*3/uL (ref 145–400)
RBC: 4.31 10*6/uL (ref 3.70–5.45)
RDW: 16.2 % — AB (ref 11.2–14.5)
WBC: 4.3 10*3/uL (ref 3.9–10.3)

## 2017-01-05 LAB — COMPREHENSIVE METABOLIC PANEL
ALBUMIN: 3.7 g/dL (ref 3.5–5.0)
ALK PHOS: 69 U/L (ref 40–150)
ALT: 15 U/L (ref 0–55)
ANION GAP: 9 meq/L (ref 3–11)
AST: 19 U/L (ref 5–34)
BUN: 17.1 mg/dL (ref 7.0–26.0)
CALCIUM: 10.2 mg/dL (ref 8.4–10.4)
CO2: 29 mEq/L (ref 22–29)
Chloride: 94 mEq/L — ABNORMAL LOW (ref 98–109)
Creatinine: 1 mg/dL (ref 0.6–1.1)
EGFR: 58 mL/min/{1.73_m2} — AB (ref >90)
Glucose: 90 mg/dl (ref 70–140)
POTASSIUM: 4 meq/L (ref 3.5–5.1)
Sodium: 133 mEq/L — ABNORMAL LOW (ref 136–145)
Total Bilirubin: 0.34 mg/dL (ref 0.20–1.20)
Total Protein: 7.6 g/dL (ref 6.4–8.3)

## 2017-01-05 NOTE — Procedures (Cosign Needed)
PATIENT'S NAME:  Charlotte [name], Charlotte [name] DOB:      [birth year]      MR#:    [id number]     DATE OF RECORDING: 12/29/2016 REFERRING M.D.:  Rosita Fire MD Study Performed:   CPAP  Titration HISTORY:  This 83 year old patient has fatigue, and insomnia, anxiety, unexplained weight loss, and malaise- chemotherapy related anemia and was referred for a PSG. The patient describes poor sleep quality, insomnia, Nocturia, and is known to snore. Her PSG from 11-25-2016 reached an AHI of 16.1, REM AHI of 19.4 and 77 minutes of hypoxemia.  Sleep efficiency was only 54.8 %.   The patient endorsed the Epworth Sleepiness Scale at 2/24 points. The patient's weight 131 pounds with a height of 62 (inches), resulting in a BMI of 23.9 kg/m2. The patient's neck circumference measured 14 inches.  CURRENT MEDICATIONS: Arimidex Aspirin, Calcium, Restasis eye, Tylenol, Plendil, Flonase, Apresoline, Ativan, Cozaar, Melatonin, Centrum , Fish oil Triple Strength, Systane eye, Retin-A    PROCEDURE:  This is a multichannel digital polysomnogram utilizing the SomnoStar 11.2 system.  Electrodes and sensors were applied and monitored per AASM Specifications.   EEG, EOG, Chin and Limb EMG, were sampled at 200 Hz.  ECG, Snore and Nasal Pressure, Thermal Airflow, Respiratory Effort, CPAP Flow and Pressure, Oximetry was sampled at 50 Hz. Digital video and audio were recorded.      CPAP was initiated at 5 cmH20 with heated humidity per AASM split night standards and pressure was advanced to 11/11cmH20 because of hypopneas, apneas and desaturations.  At a PAP pressure of 11 cmH20, there was a reduction of the AHI to 0.0 with improvement of the above symptoms of obstructive sleep apnea.    Lights Out was at 21:42 and Lights On at 04:59. Total recording time (TRT) was 437.5 minutes, with a total sleep time (TST) of 343 minutes. The patient's sleep latency was 25.5 minutes with 0 minutes of wake time after sleep onset. REM latency was 125.5 minutes.   The sleep efficiency was 78.4 %.    SLEEP ARCHITECTURE: WASO (Wake after sleep onset) was 83 minutes.   There were 15 minutes in Stage N1, 280.5 minutes Stage N2, 0 minutes Stage N3 and 47.5 minutes in Stage REM.  The percentage of Stage N1 was 4.4%, Stage N2 was 81.8%, Stage N3 was 0% and Stage R (REM sleep) was 13.8%.    RESPIRATORY ANALYSIS:  There was a total of 6 respiratory events: 0 obstructive apneas, 1 central apneas and 0 mixed apneas with a total of 1 apneas and an apnea index (AI) of .2 /hour. There were 5 hypopneas with a hypopnea index of .9/hour. The patient also had 7 respiratory event related arousals (RERAs).      The total APNEA/HYPOPNEA INDEX  (AHI) was 1. /hour and the total RESPIRATORY DISTURBANCE INDEX was 2.3 .hour  0 events occurred in REM sleep and 6 events in NREM. The REM AHI was 0 /hour versus a non-REM AHI of 1.2 /hour.  The patient spent 54 minutes of total sleep time in the supine position and 289 minutes in non-supine. The supine AHI was 6.7, versus a non-supine AHI of 0.0.  OXYGEN SATURATION & C02:  The baseline 02 saturation was 97%, with the lowest being 87%. Time spent below 89% saturation equaled 9 minutes.  PERIODIC LIMB MOVEMENTS:    The patient had a total of 97 Periodic Limb Movements. The Periodic Limb Movement (PLM) index was 17.0 and the PLM Arousal index  was 0 /hour. The arousals were noted as: 18 were spontaneous, 0 were associated with PLMs, 0 were associated with respiratory events. Audio and video analysis did not show any abnormal or unusual movements, behaviors, phonations or vocalizations.   The patient took 2 bathroom breaks. Snoring was not longer noted. EKG was in keeping with normal sinus rhythm (NSR). Post-study, the patient indicated that sleep was more fragmented than usual.    DIAGNOSIS 1. Obstructive Sleep Apnea, responsive to CPAP therapy. 2. Anxiety. The patient's sleep is very easily disturbed. She could not use a nasal mask, was  not tried on a nasal pillow and finally a FFM was chosen.    3. Reduced sleep efficiency, Insomnia continued through PSG and titration study, in spite of Xanax being used as sleep aid.    PLANS/RECOMMENDATIONS: Best level of CPAP to resolve OSA was 11 cm water while using a FFM. The patient was fitted with a Fisher-Paykel Simplus Full Face mask, in medium size.  Order for PAP auto titration machine with a setting from 6 through 12 cm water pressure with 2 cm EPR.  The machine is to BiPAP compatible if the need arises.  A follow up appointment will be scheduled in the Sleep Clinic at [facility].   Please call [phone] with any questions.     I certify that I have reviewed the entire raw data recording prior to the issuance of this report in accordance with the Standards of Accreditation of the American Academy of Sleep Medicine (AASM)  Larey Seat, M.D.  01-05-2017 Diplomat, American Board of Psychiatry and Neurology  Diplomat, American Board of Sleep Medicine Medical Director of [facility] at [facility], an AASM accredited facility

## 2017-01-05 NOTE — Telephone Encounter (Signed)
Pt and pt's daughter called me regarding sleep study results. I advised her that her sleep study revealed osa that was responsive to cpap therapy. Anxiety was also noted and pt was easily disturbed. Reduced sleep efficiency was noted with insomnia throughout her study. Dr. Brett Fairy recommends that pt start a cpap. Pt is agreeable to this. I advised her that I would send the order for cpap to a DME, [facility], and they will be giving the pt a call to discuss set up. I reviewed cpap compliance with the pt. Pt is agreeable to a follow up appt with Dr. Brett Fairy on 03/15/17 at 1:30pm. Pt verbalized understanding of results. Pt had no questions at this time but was encouraged to call back if questions arise.

## 2017-01-05 NOTE — Telephone Encounter (Signed)
1. Obstructive Sleep Apnea, responsive to CPAP therapy. 2. Anxiety. The patient's sleep is very easily disturbed. She could not use a nasal mask, was not tried on a nasal pillow and finally a FFM was chosen.    3. Reduced sleep efficiency, Insomnia continued through PSG and titration study, in spite of Xanax being used as sleep aid.    PLANS/RECOMMENDATIONS: Best level of CPAP to resolve OSA was 11 cm water while using a FFM. The patient was fitted with a Fisher-Paykel Simplus Full Face mask, in medium size.  Order for PAP auto titration machine with a setting from 6 through 12 cm water pressure with 2 cm EPR.  The machine is to BiPAP compatible if the need arises.  A follow up appointment will be scheduled in the Sleep Clinic at [facility].   Please call [phone] with any questions.

## 2017-01-05 NOTE — Progress Notes (Signed)
Patient Care Team: Rosita Fire, MD as PCP - General (Internal Medicine)  DIAGNOSIS:  Encounter Diagnosis  Name Primary?  . Malignant neoplasm of upper-inner quadrant of left breast in female, estrogen receptor positive ([place])     SUMMARY OF ONCOLOGIC HISTORY:   Malignant neoplasm of upper-inner quadrant of left female breast ([place])   06/02/2016 Initial Diagnosis    Left breast biopsy 11:00 position: IDC with DCIS, ER 60%, PR 0%, Ki-67 15%, HER-2 negative ratio 1.4 to      06/17/2016 Surgery    Left lumpectomy: IDC grade 2, 2.4 cm, with intermediate grade DCIS, LVID present, margins negative, 0/4 lymph nodes positive, ER 60%, PR 0%, HER-2 negative ratio 1.42, Ki-67 15%, T2 N0 stage II a      07/17/2016 - 08/14/2016 Radiation Therapy    Adjuvant radiation therapy      07/21/2016 -  Chemotherapy    Herceptin every 3 weeks 1 year along with anastrozole      12/09/2016 - 12/12/2016 Hospital Admission    Shortness of breath possibly pneumonia vs CHF       CHIEF COMPLIANT: follow-up after recent hospitalization forCHF  INTERVAL HISTORY: Charlotte [name] is a 83 year old with above-mentioned history of left breast cancer treated with lumpectomy and radiation. She was on Herceptin treatment when we noticed there was a decline in ejection fraction to 50%. We stopped therapy. Soon after she presented to the hospital with shortness of breath and was treated for possible pneumonia versus congestive heart failure. I do not think she had pneumonia because she had radiation pneumonitis. Her breathing has improved slightly. She was diagnosed also with obstructive sleep apnea and is waiting for her sleep apnea mask to arrive.she is complaining of feeling severely fatigued. She is on Lasix and she attributes her fatigue to Lasix. She had a CT scan and is here to discuss the result  REVIEW OF SYSTEMS:   Constitutional: Denies fevers, chills or abnormal weight loss Eyes: Denies blurriness of  vision Ears, nose, mouth, throat, and face: Denies mucositis or sore throat Respiratory: Denies cough, dyspnea or wheezes Cardiovascular: Denies palpitation, chest discomfort Gastrointestinal:  Denies nausea, heartburn or change in bowel habits Skin: Denies abnormal skin rashes Lymphatics: Denies new lymphadenopathy or easy bruising Neurological:Denies numbness, tingling or new weaknesses Behavioral/Psych: Mood is stable, no new changes  Extremities: No lower extremity edema  All other systems were reviewed with the patient and are negative.  I have reviewed the past medical history, past surgical history, social history and family history with the patient and they are unchanged from previous note.  ALLERGIES:  is allergic to latex; bextra [valdecoxib]; and motrin [ibuprofen].  MEDICATIONS:  Current Outpatient Prescriptions  Medication Sig Dispense Refill  . anastrozole (ARIMIDEX) 1 MG tablet Take 1 tablet (1 mg total) by mouth daily. (Patient not taking: Reported on 12/23/2016) 90 tablet 3  . aspirin EC 81 MG tablet Take 81 mg by mouth daily.    . Calcium Carbonate-Vitamin D (CALCIUM 600+D3 PO) Take 1 tablet by mouth 2 (two) times daily.    . carbamide peroxide (DEBROX) 6.5 % otic solution Place 1 drop into both ears daily as needed (ear wax buildup).    . cycloSPORINE (RESTASIS) 0.05 % ophthalmic emulsion Place 1 drop into both eyes 2 (two) times daily.    . felodipine (PLENDIL) 5 MG 24 hr tablet Take 5 mg by mouth daily.     . fluticasone (FLONASE) 50 MCG/ACT nasal spray Place 2 sprays into  the nose daily as needed for allergies.    . furosemide (LASIX) 40 MG tablet Take 0.5 tablets (20 mg total) by mouth daily. 30 tablet 0  . hydrALAZINE (APRESOLINE) 50 MG tablet Take 1 tablet (50 mg total) by mouth 2 (two) times daily. 180 tablet 3  . Liniments (SALONPAS PAIN RELIEF PATCH) PADS Apply 1 each topically daily as needed (pain).    . LORazepam (ATIVAN) 1 MG tablet Take 0.5 mg by mouth  every 8 (eight) hours as needed for anxiety or sleep.     Marland Kitchen losartan (COZAAR) 100 MG tablet Take 100 mg by mouth daily.    . metoprolol succinate (TOPROL XL) 25 MG 24 hr tablet Take 0.5 tablets (12.5 mg total) by mouth daily. 30 tablet 3  . Multiple Vitamins-Minerals (CENTRUM ADULTS PO) Take 1 tablet by mouth daily.    . non-metallic deodorant Jethro Poling) MISC Apply 1 application topically daily as needed.    . Omega-3 Fatty Acids (FISH OIL TRIPLE STRENGTH) 1400 MG CAPS Take 1 tablet by mouth daily.    Vladimir Faster Glycol-Propyl Glycol (SYSTANE) 0.4-0.3 % SOLN Apply 1 drop to eye 2 (two) times daily.    . potassium chloride SA (K-DUR,KLOR-CON) 20 MEQ tablet Take 1 tablet (20 mEq total) by mouth daily. 20 tablet 0  . tretinoin (RETIN-A) 0.025 % cream Apply 1 application topically at bedtime.   3   No current facility-administered medications for this visit.     PHYSICAL EXAMINATION: ECOG PERFORMANCE STATUS: 1 - Symptomatic but completely ambulatory  Vitals:   01/05/17 1348  BP: (!) 175/63  Pulse: 96  Resp: 18  Temp: 97.9 F (36.6 C)   Filed Weights   01/05/17 1348  Weight: 127 lb 4.8 oz (57.7 kg)    GENERAL:alert, no distress and comfortable SKIN: skin color, texture, turgor are normal, no rashes or significant lesions EYES: normal, Conjunctiva are pink and non-injected, sclera clear OROPHARYNX:no exudate, no erythema and lips, buccal mucosa, and tongue normal  NECK: supple, thyroid normal size, non-tender, without nodularity LYMPH:  no palpable lymphadenopathy in the cervical, axillary or inguinal LUNGS: clear to auscultation and percussion with normal breathing effort HEART: regular rate & rhythm and no murmurs and no lower extremity edema ABDOMEN:abdomen soft, non-tender and normal bowel sounds MUSCULOSKELETAL:no cyanosis of digits and no clubbing  NEURO: alert & oriented x 3 with fluent speech, no focal motor/sensory deficits EXTREMITIES: No lower extremity edema  LABORATORY  DATA:  I have reviewed the data as listed   Chemistry      Component Value Date/Time   NA 133 (L) 01/05/2017 1334   K 4.0 01/05/2017 1334   CL 97 (L) 12/15/2016 0746   CO2 29 01/05/2017 1334   BUN 17.1 01/05/2017 1334   CREATININE 1.0 01/05/2017 1334      Component Value Date/Time   CALCIUM 10.2 01/05/2017 1334   ALKPHOS 69 01/05/2017 1334   AST 19 01/05/2017 1334   ALT 15 01/05/2017 1334   BILITOT 0.34 01/05/2017 1334       Lab Results  Component Value Date   WBC 4.3 01/05/2017   HGB 12.1 01/05/2017   HCT 36.9 01/05/2017   MCV 85.5 01/05/2017   PLT 290 01/05/2017   NEUTROABS 2.7 01/05/2017    ASSESSMENT & PLAN:  Malignant neoplasm of upper-inner quadrant of left female breast (HCC) Left lumpectomy 06/17/2016: IDC grade 2, 2.4 cm, with intermediate grade DCIS, LVID present, margins negative, 0/4 lymph nodes positive, ER 60%, PR 0%, HER-2  negative ratio 1.42, Ki-67 15%, T2 N0 stage II a Adjuvant radiation therapy started 07/17/2016 completed 08/14/2016  Treatment plan: Adjuvant Herceptin every 3 weeks started 08/01/2017for 1 year with anastrozole started 09/01/2016 Patient's cardiologist Dr. Carlyle Dolly to do her echocardiograms every 3 months for 1 year. -------------------------------------------------------------------------------------------------------------------------------- Current treatment: Herceptin every 3 weeks + anastrozole 1 mg daily started 10/13/2016 Monitoringclosely for toxicities. Echocardiogram showed an ejection fraction of 50% on 11/17/2016  Hospitalization Dec 2017 for SOB ? CHF vs Pneumonia Patient sees Dr.Branch in [place]. Ct chest 01/04/17: radiation pneumonitis No evidence of malignancy  Will D/C Herceptin  I spent 25 minutes talking to the patient of which more than half was spent in counseling and coordination of care.  No orders of the defined types were placed in this encounter.  The patient has a good understanding of  the overall plan. she agrees with it. she will call with any problems that may develop before the next visit here.   Rulon Eisenmenger, MD 01/05/17

## 2017-01-06 ENCOUNTER — Ambulatory Visit: Payer: Self-pay | Admitting: Pulmonary Disease

## 2017-01-13 DIAGNOSIS — I272 Pulmonary hypertension, unspecified: Secondary | ICD-10-CM | POA: Diagnosis not present

## 2017-01-13 DIAGNOSIS — M6281 Muscle weakness (generalized): Secondary | ICD-10-CM | POA: Diagnosis not present

## 2017-01-13 DIAGNOSIS — Z9221 Personal history of antineoplastic chemotherapy: Secondary | ICD-10-CM | POA: Diagnosis not present

## 2017-01-13 DIAGNOSIS — I5043 Acute on chronic combined systolic (congestive) and diastolic (congestive) heart failure: Secondary | ICD-10-CM | POA: Diagnosis not present

## 2017-01-13 DIAGNOSIS — Z853 Personal history of malignant neoplasm of breast: Secondary | ICD-10-CM | POA: Diagnosis not present

## 2017-01-14 ENCOUNTER — Encounter: Payer: Self-pay | Admitting: Cardiology

## 2017-01-14 ENCOUNTER — Ambulatory Visit (INDEPENDENT_AMBULATORY_CARE_PROVIDER_SITE_OTHER): Payer: Medicare Other | Admitting: Cardiology

## 2017-01-14 VITALS — BP 130/66 | HR 87 | Ht 62.0 in | Wt 128.0 lb

## 2017-01-14 DIAGNOSIS — I427 Cardiomyopathy due to drug and external agent: Secondary | ICD-10-CM | POA: Diagnosis not present

## 2017-01-14 DIAGNOSIS — R0602 Shortness of breath: Secondary | ICD-10-CM | POA: Diagnosis not present

## 2017-01-14 DIAGNOSIS — I272 Pulmonary hypertension, unspecified: Secondary | ICD-10-CM

## 2017-01-14 MED ORDER — POTASSIUM CHLORIDE CRYS ER 20 MEQ PO TBCR: 20.0000 meq | EXTENDED_RELEASE_TABLET | Freq: Every day | ORAL | 3 refills | Status: DC

## 2017-01-14 MED ORDER — FUROSEMIDE 20 MG PO TABS: 20.0000 mg | ORAL_TABLET | Freq: Every day | ORAL | 3 refills | Status: DC | PRN

## 2017-01-14 NOTE — Patient Instructions (Signed)
Medication Instructions:  TAKE LASIX 20 MG DAILY AS NEEDED ONLY  Labwork: NONE  Testing/Procedures: Your physician has requested that you have an echocardiogram. Echocardiography is a painless test that uses sound waves to create images of your heart. It provides your doctor with information about the size and shape of your heart and how well your heart's chambers and valves are working. This procedure takes approximately one hour. There are no restrictions for this procedure.    Follow-Up: Your physician recommends that you schedule a follow-up appointment in: TO BE DETERMINED   Any Other Special Instructions Will Be Listed Below (If Applicable).     If you need a refill on your cardiac medications before your next appointment, please call your pharmacy.

## 2017-01-14 NOTE — Progress Notes (Signed)
Clinical Summary Charlotte [name] is a 83 y.o.female follow up of the following medical problems.   1. Decrease in LV systolic dysfunction - echo 10/2016 LVEF 50%, down from 60-65%. Grade II diastolic dysfunction. PASP 64.  - hereceptin started 07/21/16, anastrazole started 09/01/16.  - she has chronic fatigue that is unchanged.   - patient missed her f/u appt due to being hospitalized with pneumonia and CHF - takes lasix daily. SOB overall stable.    2. Breast cancer - history of lumpectomy 05/2016  and radiation (radiation completed 07/2016), currently on herceptin maintenance. Recently started on anastrazole.  - hereceptin started 07/21/16, anastrazole started 09/01/16.   3. Pulmonary HTN - PASP above 50 on echos since 07/2015 - likely multifactorial. She has grade II diastolic dysfunction, OSA.  - followed by pulmonary   4. OSA - CPAP coming next week.  Past Medical History:  Diagnosis Date  . Anxiety   . Arthritis   . Cancer [facility])    breast cancer  . Fatigue   . History of hyperthyroidism    resolved, no medicaions for treatment at this time  . Hypertension   . Insomnia   . OSA (obstructive sleep apnea)      Allergies  Allergen Reactions  . Latex Swelling    SEVERITY OF SWELLING NOT DEFINED.  Marland Kitchen Bextra [Valdecoxib] Other (See Comments)    Stomach Pains  . Motrin [Ibuprofen] Other (See Comments)    Stomach pain     Current Outpatient Prescriptions  Medication Sig Dispense Refill  . anastrozole (ARIMIDEX) 1 MG tablet Take 1 tablet (1 mg total) by mouth daily. (Patient not taking: Reported on 12/23/2016) 90 tablet 3  . aspirin EC 81 MG tablet Take 81 mg by mouth daily.    . Calcium Carbonate-Vitamin D (CALCIUM 600+D3 PO) Take 1 tablet by mouth 2 (two) times daily.    . carbamide peroxide (DEBROX) 6.5 % otic solution Place 1 drop into both ears daily as needed (ear wax buildup).    . cycloSPORINE (RESTASIS) 0.05 % ophthalmic emulsion Place 1 drop into both eyes 2  (two) times daily.    . felodipine (PLENDIL) 5 MG 24 hr tablet Take 5 mg by mouth daily.     . fluticasone (FLONASE) 50 MCG/ACT nasal spray Place 2 sprays into the nose daily as needed for allergies.    . furosemide (LASIX) 40 MG tablet Take 0.5 tablets (20 mg total) by mouth daily. 30 tablet 0  . hydrALAZINE (APRESOLINE) 50 MG tablet Take 1 tablet (50 mg total) by mouth 2 (two) times daily. 180 tablet 3  . Liniments ([place] PAIN RELIEF PATCH) PADS Apply 1 each topically daily as needed (pain).    . LORazepam (ATIVAN) 1 MG tablet Take 0.5 mg by mouth every 8 (eight) hours as needed for anxiety or sleep.     Marland Kitchen losartan (COZAAR) 100 MG tablet Take 100 mg by mouth daily.    . metoprolol succinate (TOPROL XL) 25 MG 24 hr tablet Take 0.5 tablets (12.5 mg total) by mouth daily. 30 tablet 3  . Multiple Vitamins-Minerals (CENTRUM ADULTS PO) Take 1 tablet by mouth daily.    . non-metallic deodorant Jethro Poling) MISC Apply 1 application topically daily as needed.    . Omega-3 Fatty Acids (FISH OIL TRIPLE STRENGTH) 1400 MG CAPS Take 1 tablet by mouth daily.    Vladimir Faster Glycol-Propyl Glycol (SYSTANE) 0.4-0.3 % SOLN Apply 1 drop to eye 2 (two) times daily.    Marland Kitchen  potassium chloride SA (K-DUR,KLOR-CON) 20 MEQ tablet Take 1 tablet (20 mEq total) by mouth daily. 20 tablet 0  . tretinoin (RETIN-A) 0.025 % cream Apply 1 application topically at bedtime.   3   No current facility-administered medications for this visit.      Past Surgical History:  Procedure Laterality Date  . BREAST LUMPECTOMY     right side  . BREAST LUMPECTOMY WITH AXILLARY LYMPH NODE BIOPSY Left 06/17/2016   Procedure: BREAST LUMPECTOMY WITH AXILLARY LYMPH NODE BIOPSY;  Surgeon: Autumn Messing III, MD;  Location: [place];  Service: General;  Laterality: Left;  . COLONOSCOPY    . DILATION AND CURETTAGE OF UTERUS    . EYE SURGERY     bilateral cataracts removed  . FOOT SURGERY    . PORTACATH PLACEMENT Right 07/16/2016   Procedure: INSERTION  PORT-A-CATH RIGHT SUBLCLAVIAN;  Surgeon: Autumn Messing III, MD;  Location: [place];  Service: General;  Laterality: Right;  . TONSILLECTOMY       Allergies  Allergen Reactions  . Latex Swelling    SEVERITY OF SWELLING NOT DEFINED.  Marland Kitchen Bextra [Valdecoxib] Other (See Comments)    Stomach Pains  . Motrin [Ibuprofen] Other (See Comments)    Stomach pain      Family History  Problem Relation Age of Onset  . Heart attack Mother   . Cancer Mother   . Hypertension Other   . Lung disease Neg Hx   . Rheumatologic disease Neg Hx      Social History Charlotte [name] reports that she is a non-smoker but has been exposed to tobacco smoke. She has never used smokeless tobacco. Charlotte [name] reports that she does not drink alcohol.   Review of Systems CONSTITUTIONAL: No weight loss, fever, chills, weakness or fatigue.  HEENT: Eyes: No visual loss, blurred vision, double vision or yellow sclerae.No hearing loss, sneezing, congestion, runny nose or sore throat.  SKIN: No rash or itching.  CARDIOVASCULAR: per hpi RESPIRATORY: No shortness of breath, cough or sputum.  GASTROINTESTINAL: No anorexia, nausea, vomiting or diarrhea. No abdominal pain or blood.  GENITOURINARY: No burning on urination, no polyuria NEUROLOGICAL: No headache, dizziness, syncope, paralysis, ataxia, numbness or tingling in the extremities. No change in bowel or bladder control.  MUSCULOSKELETAL: No muscle, back pain, joint pain or stiffness.  LYMPHATICS: No enlarged nodes. No history of splenectomy.  PSYCHIATRIC: No history of depression or anxiety.  ENDOCRINOLOGIC: No reports of sweating, cold or heat intolerance. No polyuria or polydipsia.  Marland Kitchen   Physical Examination Vitals:   01/14/17 1328  BP: 130/66  Pulse: 87   Vitals:   01/14/17 1328  Weight: 128 lb (58.1 kg)  Height: 5\' 2"  (1.575 m)    Gen: resting comfortably, no acute distress HEENT: no scleral icterus, pupils equal round and reactive, no palptable cervical  adenopathy,  CV: RRR, no m/r/g, no jvd Resp: Clear to auscultation bilaterally GI: abdomen is soft, non-tender, non-distended, normal bowel sounds, no hepatosplenomegaly MSK: extremities are warm, no edema.  Skin: warm, no rash Neuro:  no focal deficits Psych: appropriate affect   Diagnostic Studies 07/2015 echo Study Conclusions  - Left ventricle: The cavity size was normal. Wall thickness was normal. Systolic function was normal. The estimated ejection fraction was in the range of 55% to 60%. Wall motion was normal; there were no regional wall motion abnormalities. Doppler parameters are consistent with abnormal left ventricular relaxation (grade 1 diastolic dysfunction). There is evidence of elevated LA pressure. - Aortic valve:  Mildly calcified annulus. Trileaflet; mildly thickened leaflets. Valve area (VTI): 1.78 cm^2. Valve area (Vmax): 1.91 cm^2. - Mitral valve: Mildly calcified annulus. Normal thickness leaflets . There was mild regurgitation. - Left atrium: The atrium was moderately dilated. - Atrial septum: No defect or patent foramen ovale was identified. - Pulmonary arteries: Systolic pressure was moderately increased. PA peak pressure: 49 mm Hg (S). - Technically adequate study.  10/2015 CT PE IMPRESSION: 1. No acute findings identified. No evidence for acute pulmonary embolus. 2. Aortic atherosclerosis and LAD coronary artery calcification.   06/2016 echo Study Conclusions  - Left ventricle: The cavity size was normal. Wall thickness was normal. Systolic function was normal. The estimated ejection fraction was in the range of 60% to 65%. Wall motion was normal; there were no regional wall motion abnormalities. Doppler parameters are consistent with abnormal left ventricular relaxation (grade 1 diastolic dysfunction). Doppler parameters are consistent with high ventricular filling pressure. - Aortic valve: Valve area  (VTI): 2.41 cm^2. Valve area (Vmax): 2.51 cm^2. Valve area (Vmean): 2.45 cm^2. - Mitral valve: There was mild regurgitation. - Left atrium: The atrium was moderately dilated. - Right atrium: The atrium was moderately dilated. - Pulmonary arteries: Systolic pressure was moderately increased. PA peak pressure: 51 mm Hg (S). - Technically adequate study.   10/2016 echo Study Conclusions  - Left ventricle: The cavity size was normal. Wall thickness was increased in a pattern of moderate LVH. Systolic function was mildly reduced. The estimated ejection fraction was 50%. LVEF calculated at 46% by speckle tracking. Reduced global longitudinal strain of -13.8%. Features are consistent with a pseudonormal left ventricular filling pattern, with concomitant abnormal relaxation and increased filling pressure (grade 2 diastolic dysfunction). - Aortic valve: Mildly calcified annulus. Trileaflet; mildly thickened leaflets. There was trivial regurgitation. - Mitral valve: There was mild regurgitation. - Left atrium: The atrium was moderately dilated. - Right atrium: Central venous pressure (est): 3 mm Hg. - Atrial septum: No defect or patent foramen ovale was identified. - Tricuspid valve: There was mild regurgitation. - Pulmonary arteries: Systolic pressure was severely increased. PA peak pressure: 64 mm Hg (S). - Pericardium, extracardiac: There was no pericardial effusion.    Assessment and Plan   1. 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 Recent echo shows drop in LVEF from 60-65% to 50%, though still within the low normal range of function this drop correltes with her starting herceptin  - we will repeat echo. Pending results may need further medication titration. Update oncology once repeat echo is available.    2. Pulmonary HTN - likely combined left sided heart disease with her grade II diastolic  dysfunction, combined with OSA and posible underlying lung disease - continue medical management of underlying etiologies, do not see strong indication for RHC - f/u with pulmonary     Arnoldo Lenis, M.D.

## 2017-01-15 DIAGNOSIS — I5043 Acute on chronic combined systolic (congestive) and diastolic (congestive) heart failure: Secondary | ICD-10-CM | POA: Diagnosis not present

## 2017-01-15 DIAGNOSIS — Z9221 Personal history of antineoplastic chemotherapy: Secondary | ICD-10-CM | POA: Diagnosis not present

## 2017-01-15 DIAGNOSIS — M6281 Muscle weakness (generalized): Secondary | ICD-10-CM | POA: Diagnosis not present

## 2017-01-15 DIAGNOSIS — Z853 Personal history of malignant neoplasm of breast: Secondary | ICD-10-CM | POA: Diagnosis not present

## 2017-01-15 DIAGNOSIS — I272 Pulmonary hypertension, unspecified: Secondary | ICD-10-CM | POA: Diagnosis not present

## 2017-01-19 DIAGNOSIS — I272 Pulmonary hypertension, unspecified: Secondary | ICD-10-CM | POA: Diagnosis not present

## 2017-01-19 DIAGNOSIS — Z853 Personal history of malignant neoplasm of breast: Secondary | ICD-10-CM | POA: Diagnosis not present

## 2017-01-19 DIAGNOSIS — M6281 Muscle weakness (generalized): Secondary | ICD-10-CM | POA: Diagnosis not present

## 2017-01-19 DIAGNOSIS — I5043 Acute on chronic combined systolic (congestive) and diastolic (congestive) heart failure: Secondary | ICD-10-CM | POA: Diagnosis not present

## 2017-01-19 DIAGNOSIS — Z9221 Personal history of antineoplastic chemotherapy: Secondary | ICD-10-CM | POA: Diagnosis not present

## 2017-01-21 DIAGNOSIS — I272 Pulmonary hypertension, unspecified: Secondary | ICD-10-CM | POA: Diagnosis not present

## 2017-01-21 DIAGNOSIS — Z853 Personal history of malignant neoplasm of breast: Secondary | ICD-10-CM | POA: Diagnosis not present

## 2017-01-21 DIAGNOSIS — Z9221 Personal history of antineoplastic chemotherapy: Secondary | ICD-10-CM | POA: Diagnosis not present

## 2017-01-21 DIAGNOSIS — I5043 Acute on chronic combined systolic (congestive) and diastolic (congestive) heart failure: Secondary | ICD-10-CM | POA: Diagnosis not present

## 2017-01-21 DIAGNOSIS — M6281 Muscle weakness (generalized): Secondary | ICD-10-CM | POA: Diagnosis not present

## 2017-01-22 ENCOUNTER — Ambulatory Visit (HOSPITAL_COMMUNITY)
Admission: RE | Admit: 2017-01-22 | Discharge: 2017-01-22 | Disposition: A | Discharge status: Home / Self Care | Payer: Medicare Other | Source: Ambulatory Visit | Attending: Cardiology | Admitting: Cardiology

## 2017-01-22 DIAGNOSIS — R0602 Shortness of breath: Secondary | ICD-10-CM | POA: Insufficient documentation

## 2017-01-22 DIAGNOSIS — I080 Rheumatic disorders of both mitral and aortic valves: Secondary | ICD-10-CM | POA: Diagnosis not present

## 2017-01-22 LAB — ECHOCARDIOGRAM COMPLETE
CHL CUP DOP CALC LVOT VTI: 26.1 cm
CHL CUP MV DEC (S): 158
CHL CUP STROKE VOLUME: 44 mL
E/e' ratio: 11
EWDT: 158 ms
FS: 37 % (ref 28–44)
IVS/LV PW RATIO, ED: 0.93
LA diam end sys: 38 mm
LA diam index: 2.37 cm/m2
LA vol A4C: 75.5 ml
LASIZE: 38 mm
LDCA: 2.54 cm2
LV E/e' medial: 11
LV E/e'average: 11
LV PW d: 11.8 mm — AB (ref 0.6–1.1)
LV TDI E'MEDIAL: 3.37
LV dias vol: 73 mL (ref 46–106)
LV e' LATERAL: 6.42 cm/s
LVDIAVOLIN: 45 mL/m2
LVOT SV: 66 mL
LVOT diameter: 18 mm
LVOT peak grad rest: 3 mmHg
LVOTPV: 91.2 cm/s
LVSYSVOL: 29 mL (ref 14–42)
LVSYSVOLIN: 18 mL/m2
MV pk A vel: 100 m/s
MV pk E vel: 70.6 m/s
RV LATERAL S' VELOCITY: 18.1 cm/s
RV TAPSE: 25.4 mm
RV sys press: 46 mmHg
Reg peak vel: 326 cm/s
Simpson's disk: 60
TDI e' lateral: 6.42
TR max vel: 326 cm/s
VTI: 222 cm

## 2017-01-22 NOTE — Progress Notes (Signed)
*  PRELIMINARY RESULTS* Echocardiogram 2D Echocardiogram has been performed.  Charlotte [name] 01/22/2017, 12:21 PM

## 2017-01-27 DIAGNOSIS — M6281 Muscle weakness (generalized): Secondary | ICD-10-CM | POA: Diagnosis not present

## 2017-01-27 DIAGNOSIS — I5043 Acute on chronic combined systolic (congestive) and diastolic (congestive) heart failure: Secondary | ICD-10-CM | POA: Diagnosis not present

## 2017-01-27 DIAGNOSIS — Z853 Personal history of malignant neoplasm of breast: Secondary | ICD-10-CM | POA: Diagnosis not present

## 2017-01-27 DIAGNOSIS — Z9221 Personal history of antineoplastic chemotherapy: Secondary | ICD-10-CM | POA: Diagnosis not present

## 2017-01-27 DIAGNOSIS — I272 Pulmonary hypertension, unspecified: Secondary | ICD-10-CM | POA: Diagnosis not present

## 2017-01-28 ENCOUNTER — Other Ambulatory Visit: Payer: Self-pay

## 2017-01-28 DIAGNOSIS — R0602 Shortness of breath: Secondary | ICD-10-CM

## 2017-01-29 DIAGNOSIS — I5043 Acute on chronic combined systolic (congestive) and diastolic (congestive) heart failure: Secondary | ICD-10-CM | POA: Diagnosis not present

## 2017-01-29 DIAGNOSIS — Z853 Personal history of malignant neoplasm of breast: Secondary | ICD-10-CM | POA: Diagnosis not present

## 2017-01-29 DIAGNOSIS — Z9221 Personal history of antineoplastic chemotherapy: Secondary | ICD-10-CM | POA: Diagnosis not present

## 2017-01-29 DIAGNOSIS — I272 Pulmonary hypertension, unspecified: Secondary | ICD-10-CM | POA: Diagnosis not present

## 2017-01-29 DIAGNOSIS — M6281 Muscle weakness (generalized): Secondary | ICD-10-CM | POA: Diagnosis not present

## 2017-02-01 ENCOUNTER — Ambulatory Visit (HOSPITAL_COMMUNITY): Admission: RE | Admit: 2017-02-01 | Payer: Medicare Other | Source: Ambulatory Visit

## 2017-02-03 DIAGNOSIS — I5043 Acute on chronic combined systolic (congestive) and diastolic (congestive) heart failure: Secondary | ICD-10-CM | POA: Diagnosis not present

## 2017-02-03 DIAGNOSIS — Z853 Personal history of malignant neoplasm of breast: Secondary | ICD-10-CM | POA: Diagnosis not present

## 2017-02-03 DIAGNOSIS — Z9221 Personal history of antineoplastic chemotherapy: Secondary | ICD-10-CM | POA: Diagnosis not present

## 2017-02-03 DIAGNOSIS — M6281 Muscle weakness (generalized): Secondary | ICD-10-CM | POA: Diagnosis not present

## 2017-02-03 DIAGNOSIS — I272 Pulmonary hypertension, unspecified: Secondary | ICD-10-CM | POA: Diagnosis not present

## 2017-02-05 DIAGNOSIS — Z9221 Personal history of antineoplastic chemotherapy: Secondary | ICD-10-CM | POA: Diagnosis not present

## 2017-02-05 DIAGNOSIS — I272 Pulmonary hypertension, unspecified: Secondary | ICD-10-CM | POA: Diagnosis not present

## 2017-02-05 DIAGNOSIS — M6281 Muscle weakness (generalized): Secondary | ICD-10-CM | POA: Diagnosis not present

## 2017-02-05 DIAGNOSIS — I5043 Acute on chronic combined systolic (congestive) and diastolic (congestive) heart failure: Secondary | ICD-10-CM | POA: Diagnosis not present

## 2017-02-05 DIAGNOSIS — Z853 Personal history of malignant neoplasm of breast: Secondary | ICD-10-CM | POA: Diagnosis not present

## 2017-02-08 ENCOUNTER — Telehealth: Payer: Self-pay | Admitting: Cardiology

## 2017-02-08 NOTE — Telephone Encounter (Signed)
Pt very happy to stop Metoprolol,will call back with update

## 2017-02-08 NOTE — Telephone Encounter (Signed)
Pt called stating she's having side effects from her metoprolol succinate (TOPROL XL) 25 MG 24 hr tablet [id number]  Please give her a call @ [phone]

## 2017-02-08 NOTE — Addendum Note (Signed)
Addended by: Barbarann Ehlers A on: 02/08/2017 12:39 PM   Modules accepted: Orders

## 2017-02-08 NOTE — Telephone Encounter (Signed)
Pt still complaining of extreme fatigue with metoprolol.She takes 12. 5 mg at night but is very tired all the time.She reports her BP was 151/64 and 115/59 today and yesterday.HR 59-70. She really wants to stop the medication, please advice

## 2017-02-08 NOTE — Telephone Encounter (Signed)
She has had a very long history of fatigue, even before starting Toprol. Its not clear that the medication is the cause. Since her heart function is stable and she is off the herceptin I'm ok with her holding the medication for now. She needs to pay very close attention to how the fatigue feels while on the medicine compared to off, as if there is no difference at some point we may retry this medication.   Carlyle Dolly MD

## 2017-02-15 ENCOUNTER — Ambulatory Visit: Payer: Self-pay | Admitting: Acute Care

## 2017-02-15 ENCOUNTER — Emergency Department (HOSPITAL_COMMUNITY): Payer: Medicare Other

## 2017-02-15 ENCOUNTER — Emergency Department (HOSPITAL_COMMUNITY)
Admission: EM | Admit: 2017-02-15 | Discharge: 2017-02-15 | Disposition: A | Discharge status: Home / Self Care | Payer: Medicare Other | Attending: Emergency Medicine | Admitting: Emergency Medicine

## 2017-02-15 ENCOUNTER — Encounter (HOSPITAL_COMMUNITY): Payer: Self-pay

## 2017-02-15 DIAGNOSIS — I5041 Acute combined systolic (congestive) and diastolic (congestive) heart failure: Secondary | ICD-10-CM | POA: Diagnosis not present

## 2017-02-15 DIAGNOSIS — R0600 Dyspnea, unspecified: Secondary | ICD-10-CM | POA: Insufficient documentation

## 2017-02-15 DIAGNOSIS — Z853 Personal history of malignant neoplasm of breast: Secondary | ICD-10-CM | POA: Diagnosis not present

## 2017-02-15 DIAGNOSIS — Z7982 Long term (current) use of aspirin: Secondary | ICD-10-CM | POA: Insufficient documentation

## 2017-02-15 DIAGNOSIS — Z7722 Contact with and (suspected) exposure to environmental tobacco smoke (acute) (chronic): Secondary | ICD-10-CM | POA: Diagnosis not present

## 2017-02-15 DIAGNOSIS — R531 Weakness: Secondary | ICD-10-CM | POA: Diagnosis not present

## 2017-02-15 DIAGNOSIS — R0602 Shortness of breath: Secondary | ICD-10-CM | POA: Diagnosis not present

## 2017-02-15 DIAGNOSIS — Z91040 Latex allergy status: Secondary | ICD-10-CM | POA: Insufficient documentation

## 2017-02-15 DIAGNOSIS — I110 Hypertensive heart disease with heart failure: Secondary | ICD-10-CM | POA: Insufficient documentation

## 2017-02-15 DIAGNOSIS — R069 Unspecified abnormalities of breathing: Secondary | ICD-10-CM | POA: Diagnosis not present

## 2017-02-15 LAB — CBC WITH DIFFERENTIAL/PLATELET
BASOS ABS: 0 10*3/uL (ref 0.0–0.1)
Basophils Relative: 0 %
EOS PCT: 1 %
Eosinophils Absolute: 0 10*3/uL (ref 0.0–0.7)
HEMATOCRIT: 36.6 % (ref 36.0–46.0)
Hemoglobin: 12.1 g/dL (ref 12.0–15.0)
LYMPHS ABS: 1 10*3/uL (ref 0.7–4.0)
LYMPHS PCT: 18 %
MCH: 27.9 pg (ref 26.0–34.0)
MCHC: 33.1 g/dL (ref 30.0–36.0)
MCV: 84.3 fL (ref 78.0–100.0)
Monocytes Absolute: 0.4 10*3/uL (ref 0.1–1.0)
Monocytes Relative: 8 %
NEUTROS ABS: 4.1 10*3/uL (ref 1.7–7.7)
Neutrophils Relative %: 73 %
PLATELETS: 331 10*3/uL (ref 150–400)
RBC: 4.34 MIL/uL (ref 3.87–5.11)
RDW: 15.6 % — ABNORMAL HIGH (ref 11.5–15.5)
WBC: 5.5 10*3/uL (ref 4.0–10.5)

## 2017-02-15 LAB — COMPREHENSIVE METABOLIC PANEL
ALBUMIN: 3.9 g/dL (ref 3.5–5.0)
ALK PHOS: 55 U/L (ref 38–126)
ALT: 20 U/L (ref 14–54)
AST: 26 U/L (ref 15–41)
Anion gap: 7 (ref 5–15)
BILIRUBIN TOTAL: 0.3 mg/dL (ref 0.3–1.2)
BUN: 14 mg/dL (ref 6–20)
CALCIUM: 10.1 mg/dL (ref 8.9–10.3)
CO2: 30 mmol/L (ref 22–32)
CREATININE: 0.93 mg/dL (ref 0.44–1.00)
Chloride: 97 mmol/L — ABNORMAL LOW (ref 101–111)
GFR calc Af Amer: >60 mL/min (ref >60)
GFR calc non Af Amer: 54 mL/min — ABNORMAL LOW (ref >60)
GLUCOSE: 163 mg/dL — AB (ref 65–99)
Potassium: 3.9 mmol/L (ref 3.5–5.1)
SODIUM: 134 mmol/L — AB (ref 135–145)
TOTAL PROTEIN: 7.6 g/dL (ref 6.5–8.1)

## 2017-02-15 LAB — BRAIN NATRIURETIC PEPTIDE: B Natriuretic Peptide: 43.6 pg/mL (ref 0.0–100.0)

## 2017-02-15 LAB — TROPONIN I: Troponin I: <0.03 ng/mL (ref <0.03)

## 2017-02-15 MED ORDER — ALBUTEROL SULFATE (2.5 MG/3ML) 0.083% IN NEBU
2.5000 mg | INHALATION_SOLUTION | Freq: Once | RESPIRATORY_TRACT | Status: AC
  Administered 2017-02-15: 2.5 mg via RESPIRATORY_TRACT
  Filled 2017-02-15: qty 3

## 2017-02-15 MED ORDER — ALBUTEROL SULFATE (2.5 MG/3ML) 0.083% IN NEBU
5.0000 mg | INHALATION_SOLUTION | Freq: Once | RESPIRATORY_TRACT | Status: DC
  Filled 2017-02-15: qty 6

## 2017-02-15 MED ORDER — SODIUM CHLORIDE 0.9 % IV SOLN
INTRAVENOUS | Status: DC
  Administered 2017-02-15: 20 mL/h via INTRAVENOUS

## 2017-02-15 NOTE — ED Notes (Signed)
Family at bedside. 

## 2017-02-15 NOTE — ED Notes (Signed)
ED Provider at bedside. 

## 2017-02-15 NOTE — ED Triage Notes (Signed)
Per EMS, pt from home. Pt has been taken metoprolol.  Pt has this week increased shortness of breath.  Baseline unknown.  96% 2l now.  Vitals: 124/66, hr 74, resp 18, 96% 2l

## 2017-02-15 NOTE — ED Provider Notes (Signed)
[facility] DEPT Provider Note   CSN: [id number] Arrival date & time: 02/15/17  1105     History   Chief Complaint Chief Complaint  Patient presents with  . Shortness of Breath    HPI Charlotte [name] is a 84 y.o. female.  HPI Patient poor she has had problems with fatigue and shortness of breath ever since her breast surgery. She reports after she was discharged there were adjustments to her blood pressure medications which also made her fatigued and weak. She feels like the symptoms have increased over the past week. There is no associated chest pain or cough. No fever. No lower extremity swelling. Consideration had initially been off of metoprolol for a week because of side effects and then restarted it this week. Past Medical History:  Diagnosis Date  . Anxiety   . Arthritis   . Cancer [facility])    breast cancer  . Fatigue   . History of hyperthyroidism    resolved, no medicaions for treatment at this time  . Hypertension   . Insomnia   . OSA (obstructive sleep apnea)     Patient Active Problem List   Diagnosis Date Noted  . Pulmonary HTN 12/23/2016  . Pleural effusion on left 12/23/2016  . Arthritis 12/23/2016  . Acute on chronic combined systolic and diastolic congestive heart failure ([place])   . Acute respiratory failure ([place]) 12/10/2016  . SOB (shortness of breath)   . Community acquired pneumonia 12/09/2016  . Acute respiratory failure with hypoxia ([place]) 12/09/2016  . Essential hypertension 12/09/2016  . 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 Chronic insomnia 11/04/2016  . 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 OSA (obstructive sleep apnea) 11/04/2016  . Snoring 11/04/2016  . Fatigue due to treatment 11/04/2016  . Malignant neoplasm of upper-inner quadrant of left female breast ([place]) 07/01/2016  . Generalized anxiety disorder 01/28/2016  . SHOULDER, ARTHRITIS, DEGEN./OSTEO 12/23/2009  . IMPINGEMENT SYNDROME 12/23/2009  . HIGH BLOOD  PRESSURE 12/23/2009    Past Surgical History:  Procedure Laterality Date  . BREAST LUMPECTOMY     right side  . BREAST LUMPECTOMY WITH AXILLARY LYMPH NODE BIOPSY Left 06/17/2016   Procedure: BREAST LUMPECTOMY WITH AXILLARY LYMPH NODE BIOPSY;  Surgeon: Autumn Messing III, MD;  Location: [place];  Service: General;  Laterality: Left;  . COLONOSCOPY    . DILATION AND CURETTAGE OF UTERUS    . EYE SURGERY     bilateral cataracts removed  . FOOT SURGERY    . PORTACATH PLACEMENT Right 07/16/2016   Procedure: INSERTION PORT-A-CATH RIGHT SUBLCLAVIAN;  Surgeon: Autumn Messing III, MD;  Location: [place];  Service: General;  Laterality: Right;  . TONSILLECTOMY      OB History    Gravida Para Term Preterm AB Living   2 2 2     2    SAB TAB Ectopic Multiple Live Births                   Home Medications    Prior to Admission medications   Medication Sig Start Date End Date Taking? Authorizing Provider  anastrozole (ARIMIDEX) 1 MG tablet Take 1 tablet (1 mg total) by mouth daily. 10/13/16  Yes Nicholas Lose, MD  aspirin EC 81 MG tablet Take 81 mg by mouth daily.   Yes Historical Provider, MD  Calcium Carbonate-Vitamin D (CALCIUM 600+D3 PO) Take 1 tablet by mouth 2 (two) times daily.   Yes Historical Provider, MD  carbamide peroxide (DEBROX) 6.5 % otic  solution Place 5 drops into both ears daily as needed (ear wax buildup).    Yes Historical Provider, MD  cycloSPORINE (RESTASIS) 0.05 % ophthalmic emulsion Place 1 drop into both eyes 2 (two) times daily.   Yes Historical Provider, MD  felodipine (PLENDIL) 5 MG 24 hr tablet Take 5 mg by mouth daily.    Yes Historical Provider, MD  fluticasone (FLONASE) 50 MCG/ACT nasal spray Place 2 sprays into the nose daily as needed for allergies.   Yes Historical Provider, MD  furosemide (LASIX) 20 MG tablet Take 1 tablet (20 mg total) by mouth daily as needed. Patient taking differently: Take 20 mg by mouth daily as needed for fluid or edema.  01/14/17 04/14/17 Yes Arnoldo Lenis, MD  hydrALAZINE (APRESOLINE) 50 MG tablet Take 1 tablet (50 mg total) by mouth 2 (two) times daily. 06/01/16  Yes Lendon Colonel, NP  Liniments [facility] PAIN RELIEF PATCH) PADS Apply 1 each topically daily as needed (pain).   Yes Historical Provider, MD  LORazepam (ATIVAN) 1 MG tablet Take 0.5 mg by mouth every 8 (eight) hours as needed for anxiety or sleep.    Yes Historical Provider, MD  losartan (COZAAR) 100 MG tablet Take 100 mg by mouth daily.   Yes Historical Provider, MD  Multiple Vitamins-Minerals (CENTRUM ADULTS PO) Take 1 tablet by mouth daily.   Yes Historical Provider, MD  non-metallic deodorant Jethro Poling) MISC Apply 1 application topically daily.    Yes Historical Provider, MD  Omega-3 Fatty Acids (FISH OIL TRIPLE STRENGTH) 1400 MG CAPS Take 1 tablet by mouth daily.   Yes Historical Provider, MD  Polyethyl Glycol-Propyl Glycol (SYSTANE) 0.4-0.3 % SOLN Apply 1 drop to eye 2 (two) times daily.   Yes Historical Provider, MD  potassium chloride SA (K-DUR,KLOR-CON) 20 MEQ tablet Take 1 tablet (20 mEq total) by mouth daily. 01/14/17  Yes Arnoldo Lenis, MD  tretinoin (RETIN-A) 0.025 % cream Apply 1 application topically at bedtime.  06/27/15  Yes Historical Provider, MD    Family History Family History  Problem Relation Age of Onset  . Heart attack Mother   . Cancer Mother   . Hypertension Other   . Lung disease Neg Hx   . Rheumatologic disease Neg Hx     Social History Social History  Substance Use Topics  . Smoking status: Passive Smoke Exposure - Never Smoker  . Smokeless tobacco: Never Used     Comment: Husband smoked  . Alcohol use No     Allergies   Latex; Bextra [valdecoxib]; and Motrin [ibuprofen]   Review of Systems Review of Systems 10 Systems reviewed and are negative for acute change except as noted in the HPI.   Physical Exam Updated Vital Signs BP 158/66   Pulse 67   Temp 97.7 F (36.5 C) (Oral)   Resp 24   SpO2 99%   Physical Exam    Constitutional: She is oriented to person, place, and time. She appears well-developed and well-nourished. No distress.  HENT:  Head: Normocephalic and atraumatic.  Mouth/Throat: Oropharynx is clear and moist.  Eyes: Conjunctivae and EOM are normal.  Neck: Neck supple.  Cardiovascular: Normal rate and regular rhythm.   No murmur heard. Pulmonary/Chest: Effort normal and breath sounds normal. No respiratory distress.  Abdominal: Soft. There is no tenderness.  Musculoskeletal: She exhibits no edema or tenderness.  Neurological: She is alert and oriented to person, place, and time. She exhibits normal muscle tone. Coordination normal.  Skin: Skin is  warm and dry.  Psychiatric: She has a normal mood and affect.  Nursing note and vitals reviewed.    ED Treatments / Results  Labs (all labs ordered are listed, but only abnormal results are displayed) Labs Reviewed  CBC WITH DIFFERENTIAL/PLATELET - Abnormal; Notable for the following:       Result Value   RDW 15.6 (*)    All other components within normal limits  COMPREHENSIVE METABOLIC PANEL - Abnormal; Notable for the following:    Sodium 134 (*)    Chloride 97 (*)    Glucose, Bld 163 (*)    GFR calc non Af Amer 54 (*)    All other components within normal limits  TROPONIN I  BRAIN NATRIURETIC PEPTIDE    EKG  EKG Interpretation  Date/Time:  Monday February 15 2017 11:18:50 EST Ventricular Rate:  77 PR Interval:    QRS Duration: 98 QT Interval:  369 QTC Calculation: 418 R Axis:   -38 Text Interpretation:  Sinus rhythm Abnormal R-wave progression, late transition LVH with secondary repolarization abnormality no sig change from previous Confirmed by Johnney Killian, MD, Jeannie Done [phone]) on 02/15/2017 3:51:28 PM       Radiology Dg Chest 2 View  Result Date: 02/15/2017 CLINICAL DATA:  Shortness of breath history of fluid on the lungs. Patient also reports fatigue. History of breast malignancy treated with lumpectomy and axillary  lymph node biopsy and chemotherapy. EXAM: CHEST  2 VIEW COMPARISON:  Chest x-ray of December 15, 2016 and CT scan of the chest of January 04, 2017. FINDINGS: The lungs are well-expanded. No pleural effusion is observed today. The interstitial markings on the left are increased especially in the upper lobe but this is not new. There are they are less conspicuous today. The left heart border is partially obscured. The right lung is clear. The trachea is midline. The bony thorax exhibits no acute abnormality. The patient has undergone previous axillary lymph node dissection on the left. IMPRESSION: Chronic bronchitic changes, stable. Persistent increased density in the left upper lobe is less conspicuous today and may reflect post radiation change although superinfection of underlying diseased lung parenchyma could produce similar findings. Overall this region appears slightly improved. If the patient's symptoms are worsening however, a repeat chest x-ray in 3-4 weeks following anticipated antibiotic therapy would be useful to assure clearing and exclude underlying malignancy. Thoracic aortic atherosclerosis. Electronically Signed   By: David  Martinique M.D.   On: 02/15/2017 12:17    Procedures Procedures (including critical care time)  Medications Ordered in ED Medications  0.9 %  sodium chloride infusion (20 mL/hr Intravenous New Bag/Given 02/15/17 1143)  albuterol (PROVENTIL) (2.5 MG/3ML) 0.083% nebulizer solution 2.5 mg (2.5 mg Nebulization Given 02/15/17 1134)     Initial Impression / Assessment and Plan / ED Course  I have reviewed the triage vital signs and the nursing notes.  Pertinent labs & imaging results that were available during my care of the patient were reviewed by me and considered in my medical decision making (see chart for details).       Final Clinical Impressions(s) / ED Diagnoses   Final diagnoses:  Dyspnea, unspecified type  Weakness  Patient clinically well in appearance.  Diagnostic workup is within normal limits. Symptoms have been chronic for months with weakness and feeling of dyspnea particularly associated with her blood pressure medications. Blood pressures and heart rate are in normal range. She is also illustrated a log for blood pressures which all appear to be within  a good range. At this time I do not feel that medication adjustments are indicated. Patient is to follow-up with her family doctor and review her symptoms and medications.  New Prescriptions New Prescriptions   No medications on file     Charlesetta Shanks, MD 02/15/17 [phone]

## 2017-02-16 DIAGNOSIS — I272 Pulmonary hypertension, unspecified: Secondary | ICD-10-CM | POA: Diagnosis not present

## 2017-02-16 DIAGNOSIS — I10 Essential (primary) hypertension: Secondary | ICD-10-CM | POA: Diagnosis not present

## 2017-02-16 DIAGNOSIS — G4733 Obstructive sleep apnea (adult) (pediatric): Secondary | ICD-10-CM | POA: Diagnosis not present

## 2017-02-16 DIAGNOSIS — C50912 Malignant neoplasm of unspecified site of left female breast: Secondary | ICD-10-CM | POA: Diagnosis not present

## 2017-02-17 ENCOUNTER — Telehealth: Payer: Self-pay | Admitting: Adult Health

## 2017-02-17 NOTE — Telephone Encounter (Signed)
Called patient to tell them about the [facility] appt scheduled. Patients daughter is aware, Sent letter in mail as reminder.

## 2017-02-21 ENCOUNTER — Encounter: Payer: Self-pay | Admitting: Pulmonary Disease

## 2017-02-22 ENCOUNTER — Encounter: Payer: Self-pay | Admitting: Pulmonary Disease

## 2017-02-22 ENCOUNTER — Telehealth: Payer: Self-pay | Admitting: Pulmonary Disease

## 2017-02-22 ENCOUNTER — Ambulatory Visit (INDEPENDENT_AMBULATORY_CARE_PROVIDER_SITE_OTHER): Payer: Medicare Other | Admitting: Pulmonary Disease

## 2017-02-22 VITALS — BP 142/88 | HR 73 | Ht 62.0 in | Wt 127.8 lb

## 2017-02-22 DIAGNOSIS — Z9989 Dependence on other enabling machines and devices: Secondary | ICD-10-CM

## 2017-02-22 DIAGNOSIS — R918 Other nonspecific abnormal finding of lung field: Secondary | ICD-10-CM | POA: Diagnosis not present

## 2017-02-22 DIAGNOSIS — I272 Pulmonary hypertension, unspecified: Secondary | ICD-10-CM | POA: Diagnosis not present

## 2017-02-22 DIAGNOSIS — G4733 Obstructive sleep apnea (adult) (pediatric): Secondary | ICD-10-CM | POA: Diagnosis not present

## 2017-02-22 NOTE — Progress Notes (Signed)
pu

## 2017-02-22 NOTE — Patient Instructions (Signed)
   Check with your primary care physician about adjusting your blood pressure regimen to control the swings in your BP as well as do some thyroid testing.  We are referring you to Pulmonary Rehab at [facility] to help with your endurance.  I will see you back in 3 months but please call if you have any further questions or concerns.  TESTS ORDERED: 1. 40mwt on room air at next appointment

## 2017-02-22 NOTE — Progress Notes (Signed)
Subjective:    Patient ID: Charlotte [name], female    DOB: 1928/02/06, 83 y.o.   MRN: [id number]  C.C.:  Follow-up for Left Upper Lobe Opacity, OSA, & Possible Pulmonary Hypertension.  HPI  She reports she went to the ED on 2/26 and was evaluated for dyspnea at [facility]. She feels like her breathing has improved. She does feel anxiety triggers her dyspnea.   Left Upper Lobe Opacity:  She denies any chest pain or pressure. No fever or chills. Denies any consistent cough.   OSA: Currently on CPAP managed by neurology. She reports she has been on her CPAP for 2 weeks and is using it every night.   Possible Pulmonary Hypertension: Suggested by echocardiogram but likely multifactorial. Known underlying OSA as well as history of congestive heart failure. She reports her BP at home has been up to 176/76 at home even with her home blood pressure medications. She reports her weight has been stable at 128-127 pounds. She denies any lower extremity edema.   Review of Systems She reports significant fatigue & malaise. She reports poor appetite. No headaches or changes in visions. She denies any abdominal pain but does have "hard" stool with some constipation.   Allergies  Allergen Reactions  . Latex Swelling    SEVERITY OF SWELLING NOT DEFINED.  Marland Kitchen Bextra [Valdecoxib] Other (See Comments)    Stomach Pains  . Motrin [Ibuprofen] Other (See Comments)    Stomach pain    Current Outpatient Prescriptions on File Prior to Visit  Medication Sig Dispense Refill  . anastrozole (ARIMIDEX) 1 MG tablet Take 1 tablet (1 mg total) by mouth daily. 90 tablet 3  . aspirin EC 81 MG tablet Take 81 mg by mouth daily.    . Calcium Carbonate-Vitamin D (CALCIUM 600+D3 PO) Take 1 tablet by mouth 2 (two) times daily.    . carbamide peroxide (DEBROX) 6.5 % otic solution Place 5 drops into both ears daily as needed (ear wax buildup).     . cycloSPORINE (RESTASIS) 0.05 % ophthalmic emulsion Place 1 drop into both  eyes 2 (two) times daily.    . felodipine (PLENDIL) 5 MG 24 hr tablet Take 5 mg by mouth daily.     . fluticasone (FLONASE) 50 MCG/ACT nasal spray Place 2 sprays into the nose daily as needed for allergies.    . furosemide (LASIX) 20 MG tablet Take 1 tablet (20 mg total) by mouth daily as needed. (Patient taking differently: Take 20 mg by mouth daily as needed for fluid or edema. ) 90 tablet 3  . hydrALAZINE (APRESOLINE) 50 MG tablet Take 1 tablet (50 mg total) by mouth 2 (two) times daily. 180 tablet 3  . Liniments (SALONPAS PAIN RELIEF PATCH) PADS Apply 1 each topically daily as needed (pain).    . LORazepam (ATIVAN) 1 MG tablet Take 0.5 mg by mouth every 8 (eight) hours as needed for anxiety or sleep.     Marland Kitchen losartan (COZAAR) 100 MG tablet Take 100 mg by mouth daily.    . Multiple Vitamins-Minerals (CENTRUM ADULTS PO) Take 1 tablet by mouth daily.    . non-metallic deodorant Jethro Poling) MISC Apply 1 application topically daily.     . Omega-3 Fatty Acids (FISH OIL TRIPLE STRENGTH) 1400 MG CAPS Take 1 tablet by mouth daily.    Vladimir Faster Glycol-Propyl Glycol (SYSTANE) 0.4-0.3 % SOLN Apply 1 drop to eye 2 (two) times daily.    . potassium chloride SA (K-DUR,KLOR-CON) 20 MEQ  tablet Take 1 tablet (20 mEq total) by mouth daily. 30 tablet 3  . tretinoin (RETIN-A) 0.025 % cream Apply 1 application topically at bedtime.   3   No current facility-administered medications on file prior to visit.     Past Medical History:  Diagnosis Date  . Anxiety   . Arthritis   . Cancer [facility])    breast cancer  . Fatigue   . History of hyperthyroidism    resolved, no medicaions for treatment at this time  . Hypertension   . Insomnia   . OSA (obstructive sleep apnea)     Past Surgical History:  Procedure Laterality Date  . BREAST LUMPECTOMY     right side  . BREAST LUMPECTOMY WITH AXILLARY LYMPH NODE BIOPSY Left 06/17/2016   Procedure: BREAST LUMPECTOMY WITH AXILLARY LYMPH NODE BIOPSY;  Surgeon: Autumn Messing III,  MD;  Location: [place];  Service: General;  Laterality: Left;  . COLONOSCOPY    . DILATION AND CURETTAGE OF UTERUS    . EYE SURGERY     bilateral cataracts removed  . FOOT SURGERY    . PORTACATH PLACEMENT Right 07/16/2016   Procedure: INSERTION PORT-A-CATH RIGHT SUBLCLAVIAN;  Surgeon: Autumn Messing III, MD;  Location: [place];  Service: General;  Laterality: Right;  . TONSILLECTOMY      Family History  Problem Relation Age of Onset  . Heart attack Mother   . Cancer Mother   . Hypertension Other   . Lung disease Neg Hx   . Rheumatologic disease Neg Hx     Social History   Social History  . Marital status: Married    Spouse name: N/A  . Number of children: N/A  . Years of education: N/A   Social History Main Topics  . Smoking status: Passive Smoke Exposure - Never Smoker  . Smokeless tobacco: Never Used     Comment: Husband smoked  . Alcohol use No  . Drug use: No  . Sexual activity: Not Currently   Other Topics Concern  . 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 Originally from [place]. Lived for years in [place]. Moved back to [place] in 1991. Has also lived in [place]. No pets currently. No bird or mold exposure.      Objective:   Physical Exam BP (!) 142/88 (BP Location: Left Arm, Patient Position: Sitting, Cuff Size: Normal)   Pulse 73   Ht '5\' 2"'$  (1.575 m)   Wt 127 lb 12.8 oz (58 kg)   SpO2 98%   BMI 23.37 kg/m  General:  Awake. Alert. No acute distress. Daughter with patient today. Integument: Warm and dry. Without rash or bruising on exposed skin.  Extremities:  No cyanosis or clubbing.  HEENT:  Moist mucus membranes. No oral ulcers. No scleral injection or icterus. mild bilateral nasal turbinate swelling.  Cardiovascular:  Regular rate. No edema. No appreciable JVD.  Pulmonary:  Good aeration & clear to auscultation  bilaterally. Symmetric chest wall expansion. No accessory muscle use on room air. Abdomen: Soft. Normal bowel sounds. Nondistended.  Musculoskeletal:  Normal bulk and tone. No joint deformity or effusion appreciated.  CPAP COMPLIANCE DOWNLOAD 02/03 - 02/21/17:  97% usage >/= 4 hours. Minimum pressure 6cm H2O & maximum 12 cm H2O. Residual AHI 0.2 events/hr.   POLYSOMNOGRAM (11/25/16): Total AHI 16.1 events/hour with non-REM AHI 19.4 events/hour. Baseline saturation 96%  with low saturation 75%. Patient spent 77 minutes with saturation less than 89%. Patient hasn't a total of 0 periodic limb movements. Patient had isolated PVCs on EKG but otherwise normal sinus rhythm. Interestingly patient had neither REM sleep nor supine positional sleep recorded.  IMAGING CXR PA/LAT 02/15/17 (personally reviewed by me): Left upper lung opacity slightly less dense compared with imaging from December. Resolved silhouetting of the left hemidiaphragm. No pleural effusion appreciated. Heart normal in size & mediastinum normal in contour.  CT CHEST 01/04/17 (personally reviewed by me): Persistent opacification with air bronchograms suggesting consolidation within the left upper lobe. Evidence of progression compared with prior CT imaging from December 2017. Trace left pleural effusion versus pleural thickening. Unable to appreciate any pathologic mediastinal adenopathy.  CARDIAC TTE (11/17/16): LV normal in size with moderate LVH. EF 50%. Grade 2 diastolic dysfunction. LA moderately dilated & RA normal in size. RV normal in size and function. Pulmonary artery systolic pressure 64 mmHg. Trivial aortic regurgitation. Aortic root normal in size. Mild mitral regurgitation. No patent foreman ovale identified. Trivial pulmonic regurgitation. Mild tricuspid regurgitation. No pericardial effusion. Previous ejection fraction 60-65% in July 2017 with grade 1 diastolic dysfunction, preserved right ventricular size/function, & pulmonary  artery systolic pressure 51 mmHg.  LABS 11/04/15 ABG on RA:  7.649 / 22.8 / 117 / 99%  12/12/16 CRP:  <0.8 ESR:  20 ANA:  Positive (Speckled 1:160 & homogeneous 1:320) Anti-CCP:  14 RF:  <10 SCL-70:  <0.2    Assessment & Plan:  83 y.o.  female with underlying pulmonary hypertension and obstructive sleep apnea. Pulmonary hypertension is likely multifactorial from left ventricular dysfunction as well as her underlying obstructive sleep apnea. Reviewing her imaging does show residual lung opacity on the left likely due to scar tissue formation/postradiation fibrosis. No further imaging is necessary from my point of view. Her pulmonary hypertension seems to be well-controlled with diuretic therapy and I encouraged the patient to continue to use her home CPAP. She may benefit from pulmonary rehabilitation to improve her exercise tolerance. I instructed the patient contact my office if she had any new breathing problems or questions before next appointment.   1. Left Upper Lobe Opacity:  Likely scar tissue from previous radiation therapy to left breast. Continuing to follow with medical oncology. 2. OSA:  Excellent compliance with home CPAP. Recommended continued use indefinitely. Has follow-up with neurology. 3. Pulmonary Hypertension:  Appears euvolemic today. Referring patient to pulmonary rehabilitation. Checking 6 minute walk test on room air at next appointment. 4. Health Maintenance:  Status post influenza vaccine September 2017.  5. Follow-up: Return to clinic in 3 months or sooner if needed.  Sonia Baller Ashok Cordia, M.D. [facility] Pager:  [phone] After 3pm or if no response, call [phone] 2:18 PM 02/22/17

## 2017-02-22 NOTE — Telephone Encounter (Signed)
IMAGING CXR PA/LAT 02/15/17 (personally reviewed by me): Left upper lung opacity slightly less dense compared with imaging from December. Resolved silhouetting of the left hemidiaphragm. No pleural effusion appreciated. Heart normal in size & mediastinum normal in contour.  CT CHEST 01/04/17 (personally reviewed by me): Persistent opacification with air bronchograms suggesting consolidation within the left upper lobe. Evidence of progression compared with prior CT imaging from December 2017. Trace left pleural effusion versus pleural thickening. Unable to appreciate any pathologic mediastinal adenopathy.  LABS 12/12/16 CRP:  <0.8 ESR:  20 ANA:  Positive (Speckled 1:160 & homogeneous 1:320) Anti-CCP:  14 RF:  <10 SCL-70:  <0.2 

## 2017-02-23 DIAGNOSIS — R7309 Other abnormal glucose: Secondary | ICD-10-CM | POA: Diagnosis not present

## 2017-02-23 DIAGNOSIS — C50912 Malignant neoplasm of unspecified site of left female breast: Secondary | ICD-10-CM | POA: Diagnosis not present

## 2017-02-23 DIAGNOSIS — R634 Abnormal weight loss: Secondary | ICD-10-CM | POA: Diagnosis not present

## 2017-02-23 DIAGNOSIS — R8290 Unspecified abnormal findings in urine: Secondary | ICD-10-CM | POA: Diagnosis not present

## 2017-02-23 DIAGNOSIS — I10 Essential (primary) hypertension: Secondary | ICD-10-CM | POA: Diagnosis not present

## 2017-02-23 DIAGNOSIS — E559 Vitamin D deficiency, unspecified: Secondary | ICD-10-CM | POA: Diagnosis not present

## 2017-02-23 DIAGNOSIS — R300 Dysuria: Secondary | ICD-10-CM | POA: Diagnosis not present

## 2017-02-23 DIAGNOSIS — G4733 Obstructive sleep apnea (adult) (pediatric): Secondary | ICD-10-CM | POA: Diagnosis not present

## 2017-02-23 DIAGNOSIS — R42 Dizziness and giddiness: Secondary | ICD-10-CM | POA: Diagnosis not present

## 2017-02-23 DIAGNOSIS — I272 Pulmonary hypertension, unspecified: Secondary | ICD-10-CM | POA: Diagnosis not present

## 2017-02-23 DIAGNOSIS — M609 Myositis, unspecified: Secondary | ICD-10-CM | POA: Diagnosis not present

## 2017-03-09 ENCOUNTER — Telehealth: Payer: Self-pay | Admitting: Pulmonary Disease

## 2017-03-09 NOTE — Telephone Encounter (Signed)
LABS 02/23/17 BMP: 137/5.2/96/25/14/1.04/112/10.0 LFT: 4.1/7.0/0.5/48/19/14 CBC: 4.9/12.3/37.8/365 Eosinophils: 0.4

## 2017-03-13 ENCOUNTER — Encounter: Payer: Self-pay | Admitting: Neurology

## 2017-03-15 ENCOUNTER — Ambulatory Visit (INDEPENDENT_AMBULATORY_CARE_PROVIDER_SITE_OTHER): Payer: Medicare Other | Admitting: Neurology

## 2017-03-15 ENCOUNTER — Encounter: Payer: Self-pay | Admitting: Neurology

## 2017-03-15 VITALS — BP 182/81 | HR 76 | Resp 20 | Ht 62.0 in | Wt 127.0 lb

## 2017-03-15 DIAGNOSIS — F418 Other specified anxiety disorders: Secondary | ICD-10-CM | POA: Diagnosis not present

## 2017-03-15 DIAGNOSIS — F419 Anxiety disorder, unspecified: Secondary | ICD-10-CM

## 2017-03-15 DIAGNOSIS — F409 Phobic anxiety disorder, unspecified: Secondary | ICD-10-CM | POA: Diagnosis not present

## 2017-03-15 DIAGNOSIS — G4733 Obstructive sleep apnea (adult) (pediatric): Secondary | ICD-10-CM | POA: Diagnosis not present

## 2017-03-15 DIAGNOSIS — F329 Major depressive disorder, single episode, unspecified: Secondary | ICD-10-CM

## 2017-03-15 DIAGNOSIS — I10 Essential (primary) hypertension: Secondary | ICD-10-CM | POA: Diagnosis not present

## 2017-03-15 DIAGNOSIS — F5105 Insomnia due to other mental disorder: Secondary | ICD-10-CM | POA: Diagnosis not present

## 2017-03-15 DIAGNOSIS — Z9989 Dependence on other enabling machines and devices: Secondary | ICD-10-CM | POA: Diagnosis not present

## 2017-03-15 MED ORDER — SERTRALINE HCL 25 MG PO TABS: 25.0000 mg | ORAL_TABLET | Freq: Every day | ORAL | 5 refills | Status: DC

## 2017-03-15 NOTE — Patient Instructions (Signed)

## 2017-03-15 NOTE — Progress Notes (Signed)
SLEEP MED[facility]INE CLINIC   Provider:  Larey Seat, M D  Referring Provider: Rosita Fire, MD Primary Care Physician:  Rosita Fire, MD  Chief Complaint  Patient presents with  . Follow-up    cpap going well, wants to discuss anxiety    HPI:  Charlotte [name] is a 83 y.o. female , seen here as a referral  from Dr. Legrand Rams for a sleep consultation,   ON July 27th of this year a breast cancer was finally identified as cause of her discomfort, fatigue, and  insomnia, she had a whole year of anxiety , unexplained weight loss, and illness- poorly defined.  This is per patients report.  Charlotte [name] had visited the local emergency room in [place] before many times and always was sent home without a diagnosis. She underwent a mammogram which did not indicate breast cancer but turned out that her breast cancer was difficult to find as it was located in the very outer upper quadrant of the left breast. In July she underwent surgery and now just begun chemotherapy. She has also a Port-A-Cath implanted into the right chest for ongoing chemotherapy. Charlotte [name] also had suffered from another breast cancer  Before-  she underwent a lumpectomy of the right breast in 2001. She has echocardiogram every 3 month while on Chemotherapy. She feels excessively sleepy since being on chemotherapy and after radiation. Her doctors feel that she should be able to regain her appetite and alongside get some energy and enjoy her life. She feels that her insomnia and the frequent fragmentation of her sleep is a hindrance. She has anxiety and can only sleep 2-4 hours at a time and the rest of the night she spends pacing the halls. She is very anxious and her daughter describes her as almost jittery, restless, driven and pressured, to a frantic degree. She went to a psychologist to evaluate her anxiety and insomnia, but supposedly was told that she doesn't have to return and doesn't need psychiatric or psychological help. She  saw Dr. Harrington Challenger in [place]. She is now here to rule out organic causes for Insomnia.   Sleep habits are as follows:She usually takes a Tylenol PM by 10:00 but often doesn't feel that she is tired enough to go to sleep or even to retreat. She watches TV at night and sometimes may spend all day in the bedroom. She blames her fatigue for not being able to rise and do a lot of activities these days. She lives in her bedroom. This has affected her circadian rhythm and days and nights have started to look alike. Sleep can be distributed all over the day and short naps but at night she struggles to get to a continuous sustain sleep pattern. She has nocturia 3-4 times each night. She is unable to tell me a rise time -  7.30 the earliest, and always up because she needs to take medication- but goes back to bed. She may not get out of bed until 12 noon.  She also likes to read from the I-Pad while in the bedroom,  which is another screen, she likes to watch TV which is screen light penetrating the bedroom. We have discussed this today in our meeting with her daughter, who is here today. She doesn't leave the house, very rarely exposes herself to day light. She cannot muster the energy to get up in AM,   I would like for her psychologist to discuss sleep hygiene rules with her as  I will  also do today.  Sleep medical history and family sleep history:  No known apnea in the family.  Social history: married , lives with her husband , who still drives.   Interval history from 03/15/2017. Charlotte [name] is here today to follow-up on her recent sleep study. She states that she still has shortness of breath, bouts of insomnia and anxiety at night. She also tends to be agitated all day long. As her daughter tells me.  She takes twice a day Ativan usually one at night to allow her to go to sleep at all, another one in the morning when she wakes up very anxious but not daily. Ativan as prescribed by her primary care  physician. I would like for the patient to be on an SSRI such as Zoloft to help with the underlying agitation and anxiety rather than take Ativan at her age.  The patient was here to be evaluated for organic reasons of insomnia and tested in a sleep study on 11/25/2016 positive for sleep apnea. Her AHI was 16.1, her RDI was 21.4 indicating that she is a loud snorer. She slept all night in nonsupine position and he did not reach REM sleep. There was a prolonged oxygen desaturation time of 77 minutes noted but no periodic limb movements and isolated premature ventricular contractions were noted that she stayed mainly in sinus rhythm. I have followed this sleep study with a CPAP titration dated 12/29/2016, which revealed an AHI of 1.0 the patient was titrated to 11 cm water with a full face mask.  A [employer]  simplus fullface mask in medium size was used and an auto titration between 6 and 12 cm water was prescribed with 2 cm EPR.The machine was to be BiPAP compatible if the need arises.  I can now see her auto titration results her residual AHI is 0.3 which is an excellent resolution of sleep apnea, the 95th percentile pressure was 11 cm water pressure and she is using the machine daily for 8 hours and 12 minutes. She has 100% compliance for days and 97% compliance for consecutive use. I think the patient very much for her compliance in this matter.  I will have her follow up with Np in 6 month.   Review of Systems: Out of a complete 14 system review, the patient complains of only the following symptoms, and all other reviewed systems are negative. Fatigue , anxiety, panic attacks,  Much less shortness of breath sometimes with palpitations, no diaphoresis, no excessive daytime sleepiness. I also reviewed her medication some of them will make her drowsy probably into the next day. These include Vistaril, Benadryl, she is on fluticasone, hydralazine 2 times daily, lorazepam also known as  Ativan  Cozaar, melatonin. Arimidex.  Epworth score 2  Again, and her daughter disputes this. Her mother is always anxious and agitated,   Fatigue severity score  N/a   , depression score n/a    Social History   Social History  . Marital status: Married    Spouse name: N/A  . Number of children: N/A  . Years of education: N/A   Occupational History  . Not on file.   Social History Main Topics  . Smoking status: Passive Smoke Exposure - Never Smoker  . Smokeless tobacco: Never Used     Comment: Husband smoked  . Alcohol use No  . Drug use: No  . Sexual activity: Not Currently   Other Topics Concern  . 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 Originally from [place]. Lived for years in [place]. Moved back to [place] in 1991. Has also lived in [place]. No pets currently. No bird or mold exposure.    Family History  Problem Relation Age of Onset  . Heart attack Mother   . Cancer Mother   . Hypertension Other   . Lung disease Neg Hx   . Rheumatologic disease Neg Hx     Past Medical History:  Diagnosis Date  . Anxiety   . Arthritis   . Cancer [facility])    breast cancer  . Fatigue   . History of hyperthyroidism    resolved, no medicaions for treatment at this time  . Hypertension   . Insomnia   . OSA (obstructive sleep apnea)     Past Surgical History:  Procedure Laterality Date  . BREAST LUMPECTOMY     right side  . BREAST LUMPECTOMY WITH AXILLARY LYMPH NODE BIOPSY Left 06/17/2016   Procedure: BREAST LUMPECTOMY WITH AXILLARY LYMPH NODE BIOPSY;  Surgeon: Autumn Messing III, MD;  Location: [facility];  Service: General;  Laterality: Left;  . COLONOSCOPY    . DILATION AND CURETTAGE OF UTERUS    . EYE SURGERY     bilateral cataracts removed  . FOOT SURGERY    . PORTACATH PLACEMENT Right 07/16/2016   Procedure: INSERTION PORT-A-CATH RIGHT  SUBLCLAVIAN;  Surgeon: Autumn Messing III, MD;  Location: [facility];  Service: General;  Laterality: Right;  . TONSILLECTOMY      Current Outpatient Prescriptions  Medication Sig Dispense Refill  . anastrozole (ARIMIDEX) 1 MG tablet Take 1 tablet (1 mg total) by mouth daily. 90 tablet 3  . aspirin EC 81 MG tablet Take 81 mg by mouth daily.    . Calcium Carbonate-Vitamin D (CALCIUM 600+D3 PO) Take 1 tablet by mouth 2 (two) times daily.    . carbamide peroxide (DEBROX) 6.5 % otic solution Place 5 drops into both ears daily as needed (ear wax buildup).     . cycloSPORINE (RESTASIS) 0.05 % ophthalmic emulsion Place 1 drop into both eyes 2 (two) times daily.    . felodipine (PLENDIL) 5 MG 24 hr tablet Take 5 mg by mouth daily.     . fluticasone (FLONASE) 50 MCG/ACT nasal spray Place 2 sprays into the nose daily as needed for allergies.    . furosemide (LASIX) 20 MG tablet Take 1 tablet (20 mg total) by mouth daily as needed. (Patient taking differently: Take 20 mg by mouth daily as needed for fluid or edema. ) 90 tablet 3  . hydrALAZINE (APRESOLINE) 50 MG tablet Take 1 tablet (50 mg total) by mouth 2 (two) times daily. 180 tablet 3  . Liniments (SALONPAS PAIN RELIEF PATCH) PADS Apply 1 each topically daily as needed (pain).    . LORazepam (ATIVAN) 1 MG tablet Take 0.5 mg by mouth every 8 (eight) hours as needed for anxiety or sleep.     Marland Kitchen losartan (COZAAR) 100 MG tablet Take 100 mg by mouth daily.    . Multiple Vitamins-Minerals (CENTRUM ADULTS PO) Take 1 tablet by mouth daily.    . non-metallic deodorant Jethro Poling) MISC Apply 1 application topically daily.     . Omega-3 Fatty Acids (FISH OIL TRIPLE STRENGTH) 1400 MG CAPS Take 1 tablet by mouth daily.    Vladimir Faster Glycol-Propyl Glycol (SYSTANE) 0.4-0.3 % SOLN  Apply 1 drop to eye 2 (two) times daily.    . potassium chloride SA (K-DUR,KLOR-CON) 20 MEQ tablet Take 1 tablet (20 mEq total) by mouth daily. 30 tablet 3  . tretinoin (RETIN-A) 0.025 % cream Apply 1  application topically at bedtime.   3   No current facility-administered medications for this visit.      Allergies as of 03/15/2017 - Review Complete 03/15/2017  Allergen Reaction Noted  . Latex Swelling 02/02/2012  . Bextra [valdecoxib] Other (See Comments) 02/02/2012  . Motrin [ibuprofen] Other (See Comments)     Vitals: BP (!) 182/81   Pulse 76   Resp 20   Ht 5\' 2"  (1.575 m)   Wt 127 lb (57.6 kg)   BMI 23.23 kg/m  Last Weight:  Wt Readings from Last 1 Encounters:  03/15/17 127 lb (57.6 kg)   LGX:QJJH mass index is 23.23 kg/m.     Last Height:   Ht Readings from Last 1 Encounters:  03/15/17 5\' 2"  (1.575 m)    Physical exam:  General: The patient is awake, alert and appears not in acute distress. The patient is well groomed. Head: Normocephalic, atraumatic. Neck is supple. Mallampati 4,  neck circumference: 13 . 75 . Nasal airflow congested ,  Cardiovascular:  Regular rate and rhythm, without  murmurs or carotid bruit, and without distended neck veins. Respiratory: Lungs are clear to auscultation. Skin:  Without evidence of edema, or rash Trunk: BMI is 24 . The patient's posture is stooped   Neurologic exam : The patient is awake and alert, oriented to place and time.   Attention span & concentration ability appears limited Speech is fluent, with , dysphonia   Mood and affect are anxious , depressed and defensive. .  Cranial nerves: Pupils are equal and briskly reactive to light.  Extraocular movements  in vertical and horizontal planes intact and without nystagmus. Visual fields by finger perimetry are intact.  Facial sensation intact to fine touch.  Facial motor strength is symmetric and tongue and uvula move midline. Shoulder shrug was symmetrical.   Motor exam:  Normal tone, muscle bulk and symmetric strength in all extremities. Sensory:  Fine touch, pinprick and vibration were tested in all extremities. Proprioception tested in the upper extremities was  normal. Coordination: Rapid alternating movements in the fingers/hands was normal. Finger-to-nose maneuver  normal without evidence of ataxia, dysmetria or tremor. Gait and station: Patient walks without assistive device and is able unassisted to climb up to the exam table. Strength within normal limits.  Stance is stable and normal.    Deep tendon reflexes: in the  upper and lower extremities are symmetric and intact.   The patient was advised of the nature of the diagnosed sleep disorder , the treatment options and risks for general a health and wellness arising from not treating the condition.  I spent more than 35  minutes of face to face time with the patient. Greater than 50% of time was spent in counseling and coordination of care. We have discussed the diagnosis and differential and I answered the patient's questions.   I had to spend a lot of time eliciting a reliable history , and sleep rhythms and routines  at this time are not existing.  She made it to establish a bedtime I would like it to be between 10 and 11 PM. She needs to see a psychologist for chronic insomnia.  I think that a great deal of her fatigue and sleepiness is also  related to her chronic illness and to the medication treatments. There is her age to consider , too and she has been through a lot of medical problems in a short period of time.  Undoubtedly, this has created some anxiety and panic and she felt finally vindicated than the breast cancer was found but it still causes a lot of unease for her. I think that she is a candidate that would respond better to an antidepressant with antianxiety effect, rather avoiding benzodiazepine.  She has been prescribed Ativan by Dr. Legrand Rams, I have asked the patient if she could discuss this with Dr. Legrand Rams however she would prefer the medication to be prescribed here. I will be happy to provide low dose Zoloft which I considered safe for her age. I had also the pleasure to review her CT  of the chest without contrast and it shows a reduction in left pleural effusion and a decreased in volume of the right upper lobe consolidation. These are post- radiation changes. No mediastinal lymphadenopathy is noted. Malignancy could certainly also contribute to fatigue.   Assessment:  After physical and neurologic examination, review of laboratory studies,  Personal review of imaging studies, reports of other /same  Imaging studies ,  Results of polysomnography/ neurophysiology testing and pre-existing records as far as provided in visit., my assessment is   Charlotte [name] suffers from sleep apnea which has been well treated on CPAP titration with AutoSet between 6 and 12 cm water and to send meter EPR. I like for her to continue the CPAP indefinitely. She has shown a very high compliance for which I thank her. Residual AHI was 0.3. Charlotte [name] insomnia has not improved but I think by this time she is probably dependent on Ativan. I would like for her to start on a low-dose of Zoloft and discuss with Dr. Legrand Rams to how to wean her off if possible. Leave her dose should not be increased at her age. She was seen by psychologist, Dr Harrington Challenger, who seemed to be OK with ativan .     Plan:  Treatment plan and additional workup :  Zoloft 25 mg daily beginning now. I will not fill Ativan for this 83 year old patient.  She is supposed to continue using CPAP. Rv with Np in 6 month and thereafter every 12 month .    Charlotte Partridge Shota Kohrs MD  03/15/2017   CC: Rosita Fire, [address] [place], [place] [address]

## 2017-04-05 ENCOUNTER — Ambulatory Visit (HOSPITAL_BASED_OUTPATIENT_CLINIC_OR_DEPARTMENT_OTHER): Payer: Medicare Other | Admitting: Hematology and Oncology

## 2017-04-05 ENCOUNTER — Encounter: Payer: Self-pay | Admitting: Hematology and Oncology

## 2017-04-05 DIAGNOSIS — Z79811 Long term (current) use of aromatase inhibitors: Secondary | ICD-10-CM | POA: Diagnosis not present

## 2017-04-05 DIAGNOSIS — Z170 Estrogen receptor positive status [ER+]: Secondary | ICD-10-CM | POA: Diagnosis not present

## 2017-04-05 DIAGNOSIS — C50212 Malignant neoplasm of upper-inner quadrant of left female breast: Secondary | ICD-10-CM | POA: Diagnosis present

## 2017-04-05 DIAGNOSIS — F419 Anxiety disorder, unspecified: Secondary | ICD-10-CM

## 2017-04-05 NOTE — Assessment & Plan Note (Signed)
Left lumpectomy 06/17/2016: IDC grade 2, 2.4 cm, with intermediate grade DCIS, LVID present, margins negative, 0/4 lymph nodes positive, ER 60%, PR 0%, HER-2 negative ratio 1.42, Ki-67 15%, T2 N0 stage II a Adjuvant radiation therapy started 07/17/2016 completed 08/14/2016  Treatment plan:  1. Adjuvant Herceptin every 3 weeks started 07/21/2016 discontinued on 11/03/2016 due to CHF. 2. Anastrozole started 09/01/2016 Patient's cardiologist Dr. Jonathan Branch. Hospitalization Dec 2017 for SOB ? CHF vs Pneumonia; because of this we discontinued Herceptin 11/03/2016 -------------------------------------------------------------------------------------------------------------------------------- Current treatment: anastrozole 1 mg daily started 10/13/2016 Monitoringclosely for toxicities. Anastrozole toxicities:  Ct chest 01/04/17: radiation pneumonitis; No evidence of malignancy  Return to clinic in 6 months for follow-up  

## 2017-04-05 NOTE — Progress Notes (Signed)
Patient Care Team: Rosita Fire, MD as PCP - General (Internal Medicine)  DIAGNOSIS:  Encounter Diagnosis  Name Primary?  . Malignant neoplasm of upper-inner quadrant of left breast in female, estrogen receptor positive ([place])     SUMMARY OF ONCOLOGIC HISTORY:   Malignant neoplasm of upper-inner quadrant of left female breast ([place])   06/02/2016 Initial Diagnosis    Left breast biopsy 11:00 position: IDC with DCIS, ER 60%, PR 0%, Ki-67 15%, HER-2 negative ratio 1.4 to      06/17/2016 Surgery    Left lumpectomy: IDC grade 2, 2.4 cm, with intermediate grade DCIS, LVID present, margins negative, 0/4 lymph nodes positive, ER 60%, PR 0%, HER-2 negative ratio 1.42, Ki-67 15%, T2 N0 stage II a      07/17/2016 - 08/14/2016 Radiation Therapy    Adjuvant radiation therapy      07/21/2016 -  Chemotherapy    Herceptin every 3 weeks 1 year along with anastrozole      12/09/2016 - 12/12/2016 Hospital Admission    Shortness of breath possibly pneumonia vs CHF       CHIEF COMPLIANT: Follow-up on anastrozole therapy since we stopped Herceptin  INTERVAL HISTORY: Charlotte [name] is a 83 year old with above-mentioned history of left breast cancer HER-2 positive disease who could not continue Herceptin treatment because of hospitalization and concern for CHF. Although I do not believe she actually had CHF, we decided to stop it because of her elderly age and health concerns. She tells me that the cardiologist has been watching her and her heart has been doing quite well. She also meets with pulmonary for her breathing issues. She was started on Cipro machine at night and it appears to be helping her. Lately she has been dealing with anxiety disorder. They have started her on Zoloft and Ativan is being tapered currently. She has a panic/anxiety attack in the morning every day. This morning she had one such episode and had to take half a tablet of Ativan. Zoloft was started 3 weeks ago and she is hoping it  would start to kick in and help her symptoms.  REVIEW OF SYSTEMS:   Constitutional: Denies fevers, chills or abnormal weight loss Eyes: Denies blurriness of vision Ears, nose, mouth, throat, and face: Denies mucositis or sore throat Respiratory: Denies cough, dyspnea or wheezes Cardiovascular: Denies palpitation, chest discomfort Gastrointestinal:  Denies nausea, heartburn or change in bowel habits Skin: Denies abnormal skin rashes Lymphatics: Denies new lymphadenopathy or easy bruising Neurological:Denies numbness, tingling or new weaknesses Behavioral/Psych: Anxiety  Extremities: No lower extremity edema  All other systems were reviewed with the patient and are negative.  I have reviewed the past medical history, past surgical history, social history and family history with the patient and they are unchanged from previous note.  ALLERGIES:  is allergic to latex; bextra [valdecoxib]; and motrin [ibuprofen].  MEDICATIONS:  Current Outpatient Prescriptions  Medication Sig Dispense Refill  . anastrozole (ARIMIDEX) 1 MG tablet Take 1 tablet (1 mg total) by mouth daily. 90 tablet 3  . aspirin EC 81 MG tablet Take 81 mg by mouth daily.    . Calcium Carbonate-Vitamin D (CALCIUM 600+D3 PO) Take 1 tablet by mouth 2 (two) times daily.    . carbamide peroxide (DEBROX) 6.5 % otic solution Place 5 drops into both ears daily as needed (ear wax buildup).     . cycloSPORINE (RESTASIS) 0.05 % ophthalmic emulsion Place 1 drop into both eyes 2 (two) times daily.    Marland Kitchen  felodipine (PLENDIL) 5 MG 24 hr tablet Take 5 mg by mouth daily.     . fluticasone (FLONASE) 50 MCG/ACT nasal spray Place 2 sprays into the nose daily as needed for allergies.    . furosemide (LASIX) 20 MG tablet Take 1 tablet (20 mg total) by mouth daily as needed. (Patient taking differently: Take 20 mg by mouth daily as needed for fluid or edema. ) 90 tablet 3  . hydrALAZINE (APRESOLINE) 50 MG tablet Take 1 tablet (50 mg total) by mouth  2 (two) times daily. 180 tablet 3  . Liniments (SALONPAS PAIN RELIEF PATCH) PADS Apply 1 each topically daily as needed (pain).    . LORazepam (ATIVAN) 1 MG tablet Take 0.5 mg by mouth every 8 (eight) hours as needed for anxiety or sleep.     Marland Kitchen losartan (COZAAR) 100 MG tablet Take 100 mg by mouth daily.    . Multiple Vitamins-Minerals (CENTRUM ADULTS PO) Take 1 tablet by mouth daily.    . non-metallic deodorant Jethro Poling) MISC Apply 1 application topically daily.     . Omega-3 Fatty Acids (FISH OIL TRIPLE STRENGTH) 1400 MG CAPS Take 1 tablet by mouth daily.    Vladimir Faster Glycol-Propyl Glycol (SYSTANE) 0.4-0.3 % SOLN Apply 1 drop to eye 2 (two) times daily.    . potassium chloride SA (K-DUR,KLOR-CON) 20 MEQ tablet Take 1 tablet (20 mEq total) by mouth daily. 30 tablet 3  . sertraline (ZOLOFT) 25 MG tablet Take 1 tablet (25 mg total) by mouth daily. 30 tablet 5  . tretinoin (RETIN-A) 0.025 % cream Apply 1 application topically at bedtime.   3   No current facility-administered medications for this visit.     PHYSICAL EXAMINATION: ECOG PERFORMANCE STATUS: 1 - Symptomatic but completely ambulatory  There were no vitals filed for this visit. There were no vitals filed for this visit.  GENERAL:alert, no distress and comfortable SKIN: skin color, texture, turgor are normal, no rashes or significant lesions EYES: normal, Conjunctiva are pink and non-injected, sclera clear OROPHARYNX:no exudate, no erythema and lips, buccal mucosa, and tongue normal  NECK: supple, thyroid normal size, non-tender, without nodularity LYMPH:  no palpable lymphadenopathy in the cervical, axillary or inguinal LUNGS: clear to auscultation and percussion with normal breathing effort HEART: regular rate & rhythm and no murmurs and no lower extremity edema ABDOMEN:abdomen soft, non-tender and normal bowel sounds MUSCULOSKELETAL:no cyanosis of digits and no clubbing  NEURO: alert & oriented x 3 with fluent speech, no focal  motor/sensory deficits EXTREMITIES: No lower extremity edema  LABORATORY DATA:  I have reviewed the data as listed   Chemistry      Component Value Date/Time   NA 134 (L) 02/15/2017 1141   NA 133 (L) 01/05/2017 1334   K 3.9 02/15/2017 1141   K 4.0 01/05/2017 1334   CL 97 (L) 02/15/2017 1141   CO2 30 02/15/2017 1141   CO2 29 01/05/2017 1334   BUN 14 02/15/2017 1141   BUN 17.1 01/05/2017 1334   CREATININE 0.93 02/15/2017 1141   CREATININE 1.0 01/05/2017 1334      Component Value Date/Time   CALCIUM 10.1 02/15/2017 1141   CALCIUM 10.2 01/05/2017 1334   ALKPHOS 55 02/15/2017 1141   ALKPHOS 69 01/05/2017 1334   AST 26 02/15/2017 1141   AST 19 01/05/2017 1334   ALT 20 02/15/2017 1141   ALT 15 01/05/2017 1334   BILITOT 0.3 02/15/2017 1141   BILITOT 0.34 01/05/2017 1334  Lab Results  Component Value Date   WBC 5.5 02/15/2017   HGB 12.1 02/15/2017   HCT 36.6 02/15/2017   MCV 84.3 02/15/2017   PLT 331 02/15/2017   NEUTROABS 4.1 02/15/2017    ASSESSMENT & PLAN:  Malignant neoplasm of upper-inner quadrant of left female breast ([place]) Left lumpectomy 06/17/2016: IDC grade 2, 2.4 cm, with intermediate grade DCIS, LVID present, margins negative, 0/4 lymph nodes positive, ER 60%, PR 0%, HER-2 negative ratio 1.42, Ki-67 15%, T2 N0 stage II a Adjuvant radiation therapy started 07/17/2016 completed 08/14/2016  Treatment plan:  1. Adjuvant Herceptin every 3 weeks started 07/21/2016 discontinued on 11/03/2016 due to CHF. 2. Anastrozole started 09/01/2016 Patient's cardiologist Dr. Carlyle Dolly. Hospitalization Dec 2017 for SOB ? CHF vs Pneumonia; because of this we discontinued Herceptin 11/03/2016 -------------------------------------------------------------------------------------------------------------------------------- Current treatment: anastrozole 1 mg daily started 10/13/2016 Monitoringclosely for toxicities. Anastrozole toxicities: Denies any hot flashes or  myalgias.  Anxiety disorder: Currently on Zoloft. I discussed with the patient that if the Zoloft does not get her relief from anxiety, we can see if reducing the dosage of anastrozole to half a tablet daily may help. I instructed her to wait a month on Zoloft to see if it resolves her symptoms. If not she will cut down the dose of of anastrozole and see what happens.  Ct chest 01/04/17: radiation pneumonitis; No evidence of malignancy  Return to clinic in 6 months for follow-up  I spent 25 minutes talking to the patient of which more than half was spent in counseling and coordination of care.  No orders of the defined types were placed in this encounter.  The patient has a good understanding of the overall plan. she agrees with it. she will call with any problems that may develop before the next visit here.   Rulon Eisenmenger, MD 04/05/17

## 2017-04-13 ENCOUNTER — Telehealth: Payer: Self-pay | Admitting: *Deleted

## 2017-04-13 ENCOUNTER — Telehealth: Payer: Self-pay

## 2017-04-13 NOTE — Telephone Encounter (Signed)
"  I'm calling to ask Dr.Gudena if I may take half the anastrozole.  I take this at 6:00 pm daily.  We discussed decreasing dose when I was seen last week.  I started Zoloft March 26 th.  I believes it's working because I no longer have shortness of breath and shaking with the anxiety attacks.  I'm very tired, weak with no energy.  I used to just wake up tired and perk up.  Now I go all day feeling tired.  I eat and drink well.  I wear a C-Pap for sleep apnea.  Not getting a full nights sleep trying to get adjusted to that so I have good nights and bad nights.  Return number [phone]."   Will notify provider.

## 2017-04-13 NOTE — Telephone Encounter (Signed)
Called pt to let her know that per Dr.Gudena's note, pt may cut her anastrozole in half. Pt appreciates the call.no further concerns at this time.

## 2017-04-14 DIAGNOSIS — C50212 Malignant neoplasm of upper-inner quadrant of left female breast: Secondary | ICD-10-CM | POA: Diagnosis not present

## 2017-04-14 NOTE — Telephone Encounter (Signed)
Called and spoke with pt. Per Dr.Gudena's note, okay to take half a pill.

## 2017-04-15 ENCOUNTER — Encounter (HOSPITAL_COMMUNITY): Payer: Self-pay

## 2017-04-15 ENCOUNTER — Encounter (HOSPITAL_COMMUNITY)
Admission: RE | Admit: 2017-04-15 | Discharge: 2017-04-15 | Disposition: A | Discharge status: Home / Self Care | Payer: Medicare Other | Source: Ambulatory Visit | Attending: Pulmonary Disease | Admitting: Pulmonary Disease

## 2017-04-15 VITALS — BP 190/70 | HR 72 | Ht 62.0 in | Wt 125.4 lb

## 2017-04-15 DIAGNOSIS — I272 Pulmonary hypertension, unspecified: Secondary | ICD-10-CM

## 2017-04-15 NOTE — Progress Notes (Signed)
Pulmonary Individual Treatment Plan  Patient Details  Name: Charlotte [name] MRN: [id number] Date of Birth: 1928-05-31 Referring Provider:     PULMONARY REHAB OTHER RESP ORIENTATION from 04/15/2017 in [place]  Referring Provider  Dr. Ashok Cordia      Initial Encounter Date:    [facility] from 04/15/2017 in [place]  Date  04/15/17  Referring Provider  Dr. Ashok Cordia      Visit Diagnosis: Pulmonary hypertension ([place])  Patient's Home Medications on Admission:   Current Outpatient Prescriptions:  .  anastrozole (ARIMIDEX) 1 MG tablet, Take 1 tablet (1 mg total) by mouth daily. (Patient taking differently: Take 0.5 mg by mouth daily. ), Disp: 90 tablet, Rfl: 3 .  aspirin EC 81 MG tablet, Take 81 mg by mouth daily., Disp: , Rfl:  .  Calcium Carbonate-Vitamin D (CALCIUM 600+D3 PO), Take 1 tablet by mouth 2 (two) times daily., Disp: , Rfl:  .  carbamide peroxide (DEBROX) 6.5 % otic solution, Place 5 drops into both ears daily as needed (ear wax buildup). , Disp: , Rfl:  .  cycloSPORINE (RESTASIS) 0.05 % ophthalmic emulsion, Place 1 drop into both eyes 2 (two) times daily., Disp: , Rfl:  .  felodipine (PLENDIL) 5 MG 24 hr tablet, Take 5 mg by mouth daily. Takes in the afternoon around 1500, Disp: , Rfl:  .  fluticasone (FLONASE) 50 MCG/ACT nasal spray, Place 2 sprays into the nose daily as needed for allergies., Disp: , Rfl:  .  furosemide (LASIX) 20 MG tablet, Take 1 tablet (20 mg total) by mouth daily as needed. (Patient taking differently: Take 20 mg by mouth daily as needed for fluid or edema. ), Disp: 90 tablet, Rfl: 3 .  hydrALAZINE (APRESOLINE) 50 MG tablet, Take 1 tablet (50 mg total) by mouth 2 (two) times daily., Disp: 180 tablet, Rfl: 3 .  Liniments (SALONPAS PAIN RELIEF PATCH) PADS, Apply 1 each topically daily as needed (pain)., Disp: , Rfl:  .  LORazepam (ATIVAN) 1 MG tablet, Take 0.5 mg by mouth every 8 (eight)  hours as needed for anxiety or sleep. , Disp: , Rfl:  .  losartan (COZAAR) 100 MG tablet, Take 100 mg by mouth daily., Disp: , Rfl:  .  Multiple Vitamins-Minerals (CENTRUM ADULTS PO), Take 1 tablet by mouth daily., Disp: , Rfl:  .  non-metallic deodorant (ALRA) MISC, Apply 1 application topically daily. , Disp: , Rfl:  .  Omega-3 Fatty Acids (FISH OIL TRIPLE STRENGTH) 1400 MG CAPS, Take 1 tablet by mouth daily., Disp: , Rfl:  .  Polyethyl Glycol-Propyl Glycol (SYSTANE) 0.4-0.3 % SOLN, Apply 1 drop to eye 2 (two) times daily., Disp: , Rfl:  .  potassium chloride SA (K-DUR,KLOR-CON) 20 MEQ tablet, Take 1 tablet (20 mEq total) by mouth daily. (Patient taking differently: Take 20 mEq by mouth daily. Takes with lasix when needed), Disp: 30 tablet, Rfl: 3 .  sertraline (ZOLOFT) 25 MG tablet, Take 1 tablet (25 mg total) by mouth daily., Disp: 30 tablet, Rfl: 5 .  tretinoin (RETIN-A) 0.025 % cream, Apply 1 application topically at bedtime. , Disp: , Rfl: 3  Past Medical History: Past Medical History:  Diagnosis Date  . Anxiety   . Arthritis   . Cancer [facility])    breast cancer  . Fatigue   . History of hyperthyroidism    resolved, no medicaions for treatment at this time  . Hypertension   . Insomnia   .  OSA (obstructive sleep apnea)     Tobacco Use: History  Smoking Status  . Passive Smoke Exposure - Never Smoker  Smokeless Tobacco  . Never Used    Comment: Husband smoked    Labs: Recent Review Flowsheet Data    Labs for ITP Cardiac and Pulmonary Rehab Latest Ref Rng & Units 11/04/2015   PHART 7.350 - 7.450 7.649(HH)   PCO2ART 35.0 - 45.0 mmHg 22.8(L)   HCO3 20.0 - 24.0 mEq/L 29.5(H)   O2SAT % 99.0      Capillary Blood Glucose: Lab Results  Component Value Date   GLUCAP 105 (H) 12/10/2016   GLUCAP 112 (H) 12/09/2016     ADL UCSD:     Pulmonary Assessment Scores    Row Name 04/15/17 1522         ADL UCSD   ADL Phase Entry     SOB Score total 47     Rest 0     Walk  2     Stairs 3     Bath 3     Dress 3     Shop 3       CAT Score   CAT Score 3       mMRC Score   mMRC Score 12        Pulmonary Function Assessment:   Exercise Target Goals: Date: 04/15/17  Exercise Program Goal: Individual exercise prescription set with THRR, safety & activity barriers. Participant demonstrates ability to understand and report RPE using BORG scale, to self-measure pulse accurately, and to acknowledge the importance of the exercise prescription.  Exercise Prescription Goal: Starting with aerobic activity 30 plus minutes a day, 3 days per week for initial exercise prescription. Provide home exercise prescription and guidelines that participant acknowledges understanding prior to discharge.  Activity Barriers & Risk Stratification:     Activity Barriers & Cardiac Risk Stratification - 04/15/17 1524      Activity Barriers & Cardiac Risk Stratification   Activity Barriers Back Problems;Shortness of Breath   Cardiac Risk Stratification High      6 Minute Walk:     6 Minute Walk    Row Name 04/15/17 1429         6 Minute Walk   Phase Initial     Distance 950 feet     Distance % Change 0 %     Walk Time 6 minutes     # of Rest Breaks 0     MPH 1.79     METS 2.37     RPE 11     Perceived Dyspnea  9     VO2 Peak 6.31     Symptoms No     Resting HR 72 bpm     Resting BP 190/70     Max Ex. HR 102 bpm     Max Ex. BP 164/68     2 Minute Post BP 170/68        Oxygen Initial Assessment:     Oxygen Initial Assessment - 04/15/17 1524      Home Oxygen   Home Oxygen Device None   Sleep Oxygen Prescription None   Home Exercise Oxygen Prescription None   Home at Rest Exercise Oxygen Prescription None     Initial 6 min Walk   Oxygen Used None     Program Oxygen Prescription   Program Oxygen Prescription None      Oxygen Re-Evaluation:   Oxygen Discharge (Final Oxygen Re-Evaluation):   Initial  Exercise Prescription:     Initial  Exercise Prescription - 04/15/17 1400      Date of Initial Exercise RX and Referring Provider   Date 04/15/17   Referring Provider Dr. Ashok Cordia     NuStep   Level 2   SPM 6   Minutes 30   METs 1.6     Prescription Details   Frequency (times per week) 2   Duration Progress to 30 minutes of continuous aerobic without signs/symptoms of physical distress     Intensity   THRR 40-80% of Max Heartrate [phone]   Ratings of Perceived Exertion 11-13   Perceived Dyspnea 0-4     Progression   Progression Continue progressive overload as per policy without signs/symptoms or physical distress.     Resistance Training   Training Prescription Yes   Weight 1   Reps 10-15      Perform Capillary Blood Glucose checks as needed.  Exercise Prescription Changes:   Exercise Comments:   Exercise Goals and Review:      Exercise Goals    Row Name 04/15/17 1525             Exercise Goals   Increase Physical Activity Yes       Intervention Provide advice, education, support and counseling about physical activity/exercise needs.;Develop an individualized exercise prescription for aerobic and resistive training based on initial evaluation findings, risk stratification, comorbidities and participant's personal goals.       Expected Outcomes Achievement of increased cardiorespiratory fitness and enhanced flexibility, muscular endurance and strength shown through measurements of functional capacity and personal statement of participant.       Increase Strength and Stamina Yes       Intervention Provide advice, education, support and counseling about physical activity/exercise needs.;Develop an individualized exercise prescription for aerobic and resistive training based on initial evaluation findings, risk stratification, comorbidities and participant's personal goals.       Expected Outcomes Achievement of increased cardiorespiratory fitness and enhanced flexibility, muscular endurance and  strength shown through measurements of functional capacity and personal statement of participant.          Exercise Goals Re-Evaluation :   Discharge Exercise Prescription (Final Exercise Prescription Changes):   Nutrition:  Target Goals: Understanding of nutrition guidelines, daily intake of sodium 1500mg , cholesterol 200mg , calories 30% from fat and 7% or less from saturated fats, daily to have 5 or more servings of fruits and vegetables.  Biometrics:     Pre Biometrics - 04/15/17 1431      Pre Biometrics   Height 5\' 2"  (1.575 m)   Waist Circumference 29 inches   Hip Circumference 33.5 inches   Waist to Hip Ratio 0.87 %   Triceps Skinfold 13 mm   % Body Fat 32.1 %   Grip Strength --  Machine Broken   Flexibility 0 in   Single Leg Stand 2 seconds       Nutrition Therapy Plan and Nutrition Goals:   Nutrition Discharge: Rate Your Plate Scores:     Nutrition Assessments - 04/15/17 1525      Rate Your Plate Scores   Pre Score 55   Pre Score % 55 %      Nutrition Goals Re-Evaluation:   Nutrition Goals Discharge (Final Nutrition Goals Re-Evaluation):   Psychosocial: Target Goals: Acknowledge presence or absence of significant depression and/or stress, maximize coping skills, provide positive support system. Participant is able to verbalize types and ability to use techniques and skills needed for  reducing stress and depression.  Initial Review & Psychosocial Screening:     Initial Psych Review & Screening - 04/15/17 1527      Initial Review   Current issues with Current Depression;Current Anxiety/Panic     Family Dynamics   Good Support System? Yes   Comments Patient has depression and anxiety with panic attacks. She is being treated with Zoloft and Ativan.     Barriers   Psychosocial barriers to participate in program The patient should benefit from training in stress management and relaxation.;Psychosocial barriers identified (see note)      Screening Interventions   Interventions Encouraged to exercise      Quality of Life Scores:     Quality of Life - 04/15/17 1511      Quality of Life Scores   Health/Function Pre 18 %   Socioeconomic Pre 30 %   Psych/Spiritual Pre 26.57 %   Family Pre 30 %   GLOBAL Pre 23.25 %      PHQ-9: Recent Review Flowsheet Data    Depression screen [facility] 2/9 04/15/2017 09/22/2016   Decreased Interest 1 0   Down, Depressed, Hopeless 1 0   PHQ - 2 Score 2 0   Altered sleeping 3 -   Tired, decreased energy 3 -   Change in appetite 0 -   Feeling bad or failure about yourself  0 -   Trouble concentrating 1 -   Moving slowly or fidgety/restless 1 -   Suicidal thoughts 0 -   PHQ-9 Score 10 -   Difficult doing work/chores Somewhat difficult -     Interpretation of Total Score  Total Score Depression Severity:  1-4 = Minimal depression, 5-9 = Mild depression, 10-14 = Moderate depression, 15-19 = Moderately severe depression, 20-27 = Severe depression   Psychosocial Evaluation and Intervention:     Psychosocial Evaluation - 04/15/17 1528      Psychosocial Evaluation & Interventions   Interventions Relaxation education;Encouraged to exercise with the program and follow exercise prescription;Stress management education   Comments Patient's PHQ-9 score was 10 and his QOL score    Continue Psychosocial Services  Follow up required by staff      Psychosocial Re-Evaluation:   Psychosocial Discharge (Final Psychosocial Re-Evaluation):    Education: Education Goals: Education classes will be provided on a weekly basis, covering required topics. Participant will state understanding/return demonstration of topics presented.  Learning Barriers/Preferences:     Learning Barriers/Preferences - 04/15/17 1521      Learning Barriers/Preferences   Learning Barriers None   Learning Preferences Written Material;Skilled Demonstration      Education Topics: How Lungs Work and Diseases: -  Discuss the anatomy of the lungs and diseases that can affect the lungs, such as COPD.   Exercise: -Discuss the importance of exercise, FITT principles of exercise, normal and abnormal responses to exercise, and how to exercise safely.   Environmental Irritants: -Discuss types of environmental irritants and how to limit exposure to environmental irritants.   Meds/Inhalers and oxygen: - Discuss respiratory medications, definition of an inhaler and oxygen, and the proper way to use an inhaler and oxygen.   Energy Saving Techniques: - Discuss methods to conserve energy and decrease shortness of breath when performing activities of daily living.    Bronchial Hygiene / Breathing Techniques: - Discuss breathing mechanics, pursed-lip breathing technique,  proper posture, effective ways to clear airways, and other functional breathing techniques   Cleaning Equipment: - Provides group verbal and written instruction about the  health risks of elevated stress, cause of high stress, and healthy ways to reduce stress.   Nutrition I: Fats: - Discuss the types of cholesterol, what cholesterol does to the body, and how cholesterol levels can be controlled.   Nutrition II: Labels: -Discuss the different components of food labels and how to read food labels.   Respiratory Infections: - Discuss the signs and symptoms of respiratory infections, ways to prevent respiratory infections, and the importance of seeking medical treatment when having a respiratory infection.   Stress I: Signs and Symptoms: - Discuss the causes of stress, how stress may lead to anxiety and depression, and ways to limit stress.   Stress II: Relaxation: -Discuss relaxation techniques to limit stress.   Oxygen for Home/Travel: - Discuss how to prepare for travel when on oxygen and proper ways to transport and store oxygen to ensure safety.   Knowledge Questionnaire Score:     Knowledge Questionnaire Score -  04/15/17 1521      Knowledge Questionnaire Score   Pre Score 9/14      Core Components/Risk Factors/Patient Goals at Admission:     Personal Goals and Risk Factors at Admission - 04/15/17 1525      Core Components/Risk Factors/Patient Goals on Admission    Weight Management Weight Maintenance   Improve shortness of breath with ADL's Yes   Intervention Provide education, individualized exercise plan and daily activity instruction to help decrease symptoms of SOB with activities of daily living.   Expected Outcomes Short Term: Achieves a reduction of symptoms when performing activities of daily living.   Develop more efficient breathing techniques such as purse lipped breathing and diaphragmatic breathing; and practicing self-pacing with activity Yes   Intervention Provide education, demonstration and support about specific breathing techniuqes utilized for more efficient breathing. Include techniques such as pursed lipped breathing, diaphragmatic breathing and self-pacing activity.   Expected Outcomes Short Term: Participant will be able to demonstrate and use breathing techniques as needed throughout daily activities.   Hypertension Yes   Intervention Provide education on lifestyle modifcations including regular physical activity/exercise, weight management, moderate sodium restriction and increased consumption of fresh fruit, vegetables, and low fat dairy, alcohol moderation, and smoking cessation.;Monitor prescription use compliance.   Expected Outcomes Short Term: Continued assessment and intervention until BP is < 140/27mm HG in hypertensive participants. < 130/71mm HG in hypertensive participants with diabetes, heart failure or chronic kidney disease.;Long Term: Maintenance of blood pressure at goal levels.   Stress Yes   Intervention Offer individual and/or small group education and counseling on adjustment to heart disease, stress management and health-related lifestyle change. Teach  and support self-help strategies.;Refer participants experiencing significant psychosocial distress to appropriate mental health specialists for further evaluation and treatment. When possible, include family members and significant others in education/counseling sessions.   Expected Outcomes Short Term: Participant demonstrates changes in health-related behavior, relaxation and other stress management skills, ability to obtain effective social support, and compliance with psychotropic medications if prescribed.;Long Term: Emotional wellbeing is indicated by absence of clinically significant psychosocial distress or social isolation.   Personal Goal Other Yes   Personal Goal Have more energy; return to normal activities at church and be able to volunteer at soup kitchen.    Intervention Patient will attend PR exercising 2 days/wk and suppment with exercise at home 3 days/wk.   Expected Outcomes Patient will complete the program meeting her personal goals.       Core Components/Risk Factors/Patient Goals Review:  Core Components/Risk Factors/Patient Goals at Discharge (Final Review):    ITP Comments:   Comments: Patient arrived for 1st visit/orientation/education at 1230. Patient was referred to CR by [facility] due to Pulmonary Hypertension (I27.10). During orientation advised patient on arrival and appointment times what to wear, what to do before, during and after exercise. Reviewed attendance and class policy. Talked about inclement weather and class consultation policy. Pt is scheduled to return Cardiac Rehab on 04/20/17 at 1:30. Pt was advised to come to class 15 minutes before class starts. Patient was also given instructions on meeting with the dietician and attending the Family Structure classes. Pt is eager to get started. Patient participated in warm-up stretches. Patient was able to complete 6 minute walk test. Patient was measured for the equipment. Discussed equipment safety with patient.  Took patient's pre-anthropometric measurements. Patient finished visit at 1500.

## 2017-04-15 NOTE — Progress Notes (Signed)
Cardiac/Pulmonary Rehab Medication Review by a Pharmacist  Does the patient  feel that his/her medications are working for him/her?  yes  Has the patient been experiencing any side effects to the medications prescribed?  yes  Does the patient measure his/her own blood pressure or blood glucose at home?  yes   Does the patient have any problems obtaining medications due to transportation or finances?   no  Understanding of regimen: excellent Understanding of indications: excellent Potential of compliance: excellent  Questions asked to Determine Patient Understanding of Medication Regimen:  1. What is the name of the medication?  2. What is the medication used for?  3. When should it be taken?  4. How much should be taken?  5. How will you take it?  6. What side effects should you report?  Understanding Defined as: Excellent: All questions above are correct Good: Questions 1-4 are correct Fair: Questions 1-2 are correct  Poor: 1 or none of the above questions are correct   Pharmacist comments: 83 yo female seen in [facility] clinic today. She is very knowledgeable and compliant with her medications. Her daughter brought her in and is very supportive. She monitors her weight daily and blood pressure. The arimidex dose was changed 3 days ago due to "weakness and no energy". In addition, she has a little anxiety and takes lorazepam when she needs it. She is trying to wean completely off lorazepam.  Otherwise, she is doing well and tolerating her current regimen.  Thanks for the opportunity to participate in the care of this patient,  Isac Sarna, BS [place]ena [place], [place] Pharmacist Pager [phone] 04/15/2017 2:23 PM

## 2017-04-15 NOTE — Progress Notes (Signed)
Pulmonary Individual Treatment Plan  Patient Details  Name: Charlotte [name] MRN: [id number] Date of Birth: 1928/01/31 Referring Provider:     PULMONARY REHAB OTHER RESP ORIENTATION from 04/15/2017 in [place]  Referring Provider  Dr. Ashok Cordia      Initial Encounter Date:    [facility] from 04/15/2017 in [place]  Date  04/15/17  Referring Provider  Dr. Ashok Cordia      Visit Diagnosis: Pulmonary hypertension ([place])  Patient's Home Medications on Admission:   Current Outpatient Prescriptions:  .  anastrozole (ARIMIDEX) 1 MG tablet, Take 1 tablet (1 mg total) by mouth daily. (Patient taking differently: Take 0.5 mg by mouth daily. ), Disp: 90 tablet, Rfl: 3 .  aspirin EC 81 MG tablet, Take 81 mg by mouth daily., Disp: , Rfl:  .  Calcium Carbonate-Vitamin D (CALCIUM 600+D3 PO), Take 1 tablet by mouth 2 (two) times daily., Disp: , Rfl:  .  carbamide peroxide (DEBROX) 6.5 % otic solution, Place 5 drops into both ears daily as needed (ear wax buildup). , Disp: , Rfl:  .  cycloSPORINE (RESTASIS) 0.05 % ophthalmic emulsion, Place 1 drop into both eyes 2 (two) times daily., Disp: , Rfl:  .  felodipine (PLENDIL) 5 MG 24 hr tablet, Take 5 mg by mouth daily. Takes in the afternoon around 1500, Disp: , Rfl:  .  fluticasone (FLONASE) 50 MCG/ACT nasal spray, Place 2 sprays into the nose daily as needed for allergies., Disp: , Rfl:  .  furosemide (LASIX) 20 MG tablet, Take 1 tablet (20 mg total) by mouth daily as needed. (Patient taking differently: Take 20 mg by mouth daily as needed for fluid or edema. ), Disp: 90 tablet, Rfl: 3 .  hydrALAZINE (APRESOLINE) 50 MG tablet, Take 1 tablet (50 mg total) by mouth 2 (two) times daily., Disp: 180 tablet, Rfl: 3 .  Liniments (SALONPAS PAIN RELIEF PATCH) PADS, Apply 1 each topically daily as needed (pain)., Disp: , Rfl:  .  LORazepam (ATIVAN) 1 MG tablet, Take 0.5 mg by mouth every 8 (eight)  hours as needed for anxiety or sleep. , Disp: , Rfl:  .  losartan (COZAAR) 100 MG tablet, Take 100 mg by mouth daily., Disp: , Rfl:  .  Multiple Vitamins-Minerals (CENTRUM ADULTS PO), Take 1 tablet by mouth daily., Disp: , Rfl:  .  non-metallic deodorant (ALRA) MISC, Apply 1 application topically daily. , Disp: , Rfl:  .  Omega-3 Fatty Acids (FISH OIL TRIPLE STRENGTH) 1400 MG CAPS, Take 1 tablet by mouth daily., Disp: , Rfl:  .  Polyethyl Glycol-Propyl Glycol (SYSTANE) 0.4-0.3 % SOLN, Apply 1 drop to eye 2 (two) times daily., Disp: , Rfl:  .  potassium chloride SA (K-DUR,KLOR-CON) 20 MEQ tablet, Take 1 tablet (20 mEq total) by mouth daily. (Patient taking differently: Take 20 mEq by mouth daily. Takes with lasix when needed), Disp: 30 tablet, Rfl: 3 .  sertraline (ZOLOFT) 25 MG tablet, Take 1 tablet (25 mg total) by mouth daily., Disp: 30 tablet, Rfl: 5 .  tretinoin (RETIN-A) 0.025 % cream, Apply 1 application topically at bedtime. , Disp: , Rfl: 3  Past Medical History: Past Medical History:  Diagnosis Date  . Anxiety   . Arthritis   . Cancer [facility])    breast cancer  . Fatigue   . History of hyperthyroidism    resolved, no medicaions for treatment at this time  . Hypertension   . Insomnia   .  OSA (obstructive sleep apnea)     Tobacco Use: History  Smoking Status  . Passive Smoke Exposure - Never Smoker  Smokeless Tobacco  . Never Used    Comment: Husband smoked    Labs: Recent Review Flowsheet Data    Labs for ITP Cardiac and Pulmonary Rehab Latest Ref Rng & Units 11/04/2015   PHART 7.350 - 7.450 7.649(HH)   PCO2ART 35.0 - 45.0 mmHg 22.8(L)   HCO3 20.0 - 24.0 mEq/L 29.5(H)   O2SAT % 99.0      Capillary Blood Glucose: Lab Results  Component Value Date   GLUCAP 105 (H) 12/10/2016   GLUCAP 112 (H) 12/09/2016     ADL UCSD:     Pulmonary Assessment Scores    Row Name 04/15/17 1522         ADL UCSD   ADL Phase Entry     SOB Score total 47     Rest 0     Walk  2     Stairs 3     Bath 3     Dress 3     Shop 3       CAT Score   CAT Score 3       mMRC Score   mMRC Score 12        Pulmonary Function Assessment:   Exercise Target Goals: Date: 04/15/17  Exercise Program Goal: Individual exercise prescription set with THRR, safety & activity barriers. Participant demonstrates ability to understand and report RPE using BORG scale, to self-measure pulse accurately, and to acknowledge the importance of the exercise prescription.  Exercise Prescription Goal: Starting with aerobic activity 30 plus minutes a day, 3 days per week for initial exercise prescription. Provide home exercise prescription and guidelines that participant acknowledges understanding prior to discharge.  Activity Barriers & Risk Stratification:     Activity Barriers & Cardiac Risk Stratification - 04/15/17 1524      Activity Barriers & Cardiac Risk Stratification   Activity Barriers Back Problems;Shortness of Breath   Cardiac Risk Stratification High      6 Minute Walk:     6 Minute Walk    Row Name 04/15/17 1429         6 Minute Walk   Phase Initial     Distance 950 feet     Distance % Change 0 %     Walk Time 6 minutes     # of Rest Breaks 0     MPH 1.79     METS 2.37     RPE 11     Perceived Dyspnea  9     VO2 Peak 6.31     Symptoms No     Resting HR 72 bpm     Resting BP 190/70     Max Ex. HR 102 bpm     Max Ex. BP 164/68     2 Minute Post BP 170/68        Oxygen Initial Assessment:     Oxygen Initial Assessment - 04/15/17 1524      Home Oxygen   Home Oxygen Device None   Sleep Oxygen Prescription None   Home Exercise Oxygen Prescription None   Home at Rest Exercise Oxygen Prescription None     Initial 6 min Walk   Oxygen Used None     Program Oxygen Prescription   Program Oxygen Prescription None      Oxygen Re-Evaluation:   Oxygen Discharge (Final Oxygen Re-Evaluation):   Initial  Exercise Prescription:     Initial  Exercise Prescription - 04/15/17 1400      Date of Initial Exercise RX and Referring Provider   Date 04/15/17   Referring Provider Dr. Ashok Cordia     NuStep   Level 2   SPM 6   Minutes 30   METs 1.6     Prescription Details   Frequency (times per week) 2   Duration Progress to 30 minutes of continuous aerobic without signs/symptoms of physical distress     Intensity   THRR 40-80% of Max Heartrate [phone]   Ratings of Perceived Exertion 11-13   Perceived Dyspnea 0-4     Progression   Progression Continue progressive overload as per policy without signs/symptoms or physical distress.     Resistance Training   Training Prescription Yes   Weight 1   Reps 10-15      Perform Capillary Blood Glucose checks as needed.  Exercise Prescription Changes:   Exercise Comments:   Exercise Goals and Review:     Exercise Goals    Row Name 04/15/17 1525             Exercise Goals   Increase Physical Activity Yes       Intervention Provide advice, education, support and counseling about physical activity/exercise needs.;Develop an individualized exercise prescription for aerobic and resistive training based on initial evaluation findings, risk stratification, comorbidities and participant's personal goals.       Expected Outcomes Achievement of increased cardiorespiratory fitness and enhanced flexibility, muscular endurance and strength shown through measurements of functional capacity and personal statement of participant.       Increase Strength and Stamina Yes       Intervention Provide advice, education, support and counseling about physical activity/exercise needs.;Develop an individualized exercise prescription for aerobic and resistive training based on initial evaluation findings, risk stratification, comorbidities and participant's personal goals.       Expected Outcomes Achievement of increased cardiorespiratory fitness and enhanced flexibility, muscular endurance and  strength shown through measurements of functional capacity and personal statement of participant.          Exercise Goals Re-Evaluation :   Discharge Exercise Prescription (Final Exercise Prescription Changes):   Nutrition:  Target Goals: Understanding of nutrition guidelines, daily intake of sodium 1500mg , cholesterol 200mg , calories 30% from fat and 7% or less from saturated fats, daily to have 5 or more servings of fruits and vegetables.  Biometrics:     Pre Biometrics - 04/15/17 1431      Pre Biometrics   Height 5\' 2"  (1.575 m)   Waist Circumference 29 inches   Hip Circumference 33.5 inches   Waist to Hip Ratio 0.87 %   Triceps Skinfold 13 mm   % Body Fat 32.1 %   Grip Strength --  Machine Broken   Flexibility 0 in   Single Leg Stand 2 seconds       Nutrition Therapy Plan and Nutrition Goals:   Nutrition Discharge: Rate Your Plate Scores:     Nutrition Assessments - 04/15/17 1525      Rate Your Plate Scores   Pre Score 55   Pre Score % 55 %      Nutrition Goals Re-Evaluation:   Nutrition Goals Discharge (Final Nutrition Goals Re-Evaluation):   Psychosocial: Target Goals: Acknowledge presence or absence of significant depression and/or stress, maximize coping skills, provide positive support system. Participant is able to verbalize types and ability to use techniques and skills needed for reducing  stress and depression.  Initial Review & Psychosocial Screening:     Initial Psych Review & Screening - 04/15/17 1527      Initial Review   Current issues with Current Depression;Current Anxiety/Panic     Family Dynamics   Good Support System? Yes   Comments Patient has depression and anxiety with panic attacks. She is being treated with Zoloft and Ativan.     Barriers   Psychosocial barriers to participate in program The patient should benefit from training in stress management and relaxation.;Psychosocial barriers identified (see note)      Screening Interventions   Interventions Encouraged to exercise      Quality of Life Scores:     Quality of Life - 04/15/17 1511      Quality of Life Scores   Health/Function Pre 18 %   Socioeconomic Pre 30 %   Psych/Spiritual Pre 26.57 %   Family Pre 30 %   GLOBAL Pre 23.25 %      PHQ-9: Recent Review Flowsheet Data    Depression screen [facility] 2/9 04/15/2017 09/22/2016   Decreased Interest 1 0   Down, Depressed, Hopeless 1 0   PHQ - 2 Score 2 0   Altered sleeping 3 -   Tired, decreased energy 3 -   Change in appetite 0 -   Feeling bad or failure about yourself  0 -   Trouble concentrating 1 -   Moving slowly or fidgety/restless 1 -   Suicidal thoughts 0 -   PHQ-9 Score 10 -   Difficult doing work/chores Somewhat difficult -     Interpretation of Total Score  Total Score Depression Severity:  1-4 = Minimal depression, 5-9 = Mild depression, 10-14 = Moderate depression, 15-19 = Moderately severe depression, 20-27 = Severe depression   Psychosocial Evaluation and Intervention:     Psychosocial Evaluation - 04/15/17 1528      Psychosocial Evaluation & Interventions   Interventions Relaxation education;Encouraged to exercise with the program and follow exercise prescription;Stress management education   Comments Patient's PHQ-9 score was 10 and his QOL score    Continue Psychosocial Services  Follow up required by staff      Psychosocial Re-Evaluation:   Psychosocial Discharge (Final Psychosocial Re-Evaluation):    Education: Education Goals: Education classes will be provided on a weekly basis, covering required topics. Participant will state understanding/return demonstration of topics presented.  Learning Barriers/Preferences:     Learning Barriers/Preferences - 04/15/17 1521      Learning Barriers/Preferences   Learning Barriers None   Learning Preferences Written Material;Skilled Demonstration      Education Topics: How Lungs Work and Diseases: -  Discuss the anatomy of the lungs and diseases that can affect the lungs, such as COPD.   Exercise: -Discuss the importance of exercise, FITT principles of exercise, normal and abnormal responses to exercise, and how to exercise safely.   Environmental Irritants: -Discuss types of environmental irritants and how to limit exposure to environmental irritants.   Meds/Inhalers and oxygen: - Discuss respiratory medications, definition of an inhaler and oxygen, and the proper way to use an inhaler and oxygen.   Energy Saving Techniques: - Discuss methods to conserve energy and decrease shortness of breath when performing activities of daily living.    Bronchial Hygiene / Breathing Techniques: - Discuss breathing mechanics, pursed-lip breathing technique,  proper posture, effective ways to clear airways, and other functional breathing techniques   Cleaning Equipment: - Provides group verbal and written instruction about the health  risks of elevated stress, cause of high stress, and healthy ways to reduce stress.   Nutrition I: Fats: - Discuss the types of cholesterol, what cholesterol does to the body, and how cholesterol levels can be controlled.   Nutrition II: Labels: -Discuss the different components of food labels and how to read food labels.   Respiratory Infections: - Discuss the signs and symptoms of respiratory infections, ways to prevent respiratory infections, and the importance of seeking medical treatment when having a respiratory infection.   Stress I: Signs and Symptoms: - Discuss the causes of stress, how stress may lead to anxiety and depression, and ways to limit stress.   Stress II: Relaxation: -Discuss relaxation techniques to limit stress.   Oxygen for Home/Travel: - Discuss how to prepare for travel when on oxygen and proper ways to transport and store oxygen to ensure safety.   Knowledge Questionnaire Score:     Knowledge Questionnaire Score -  04/15/17 1521      Knowledge Questionnaire Score   Pre Score 9/14      Core Components/Risk Factors/Patient Goals at Admission:     Personal Goals and Risk Factors at Admission - 04/15/17 1525      Core Components/Risk Factors/Patient Goals on Admission    Weight Management Weight Maintenance   Improve shortness of breath with ADL's Yes   Intervention Provide education, individualized exercise plan and daily activity instruction to help decrease symptoms of SOB with activities of daily living.   Expected Outcomes Short Term: Achieves a reduction of symptoms when performing activities of daily living.   Develop more efficient breathing techniques such as purse lipped breathing and diaphragmatic breathing; and practicing self-pacing with activity Yes   Intervention Provide education, demonstration and support about specific breathing techniuqes utilized for more efficient breathing. Include techniques such as pursed lipped breathing, diaphragmatic breathing and self-pacing activity.   Expected Outcomes Short Term: Participant will be able to demonstrate and use breathing techniques as needed throughout daily activities.   Hypertension Yes   Intervention Provide education on lifestyle modifcations including regular physical activity/exercise, weight management, moderate sodium restriction and increased consumption of fresh fruit, vegetables, and low fat dairy, alcohol moderation, and smoking cessation.;Monitor prescription use compliance.   Expected Outcomes Short Term: Continued assessment and intervention until BP is < 140/14mm HG in hypertensive participants. < 130/32mm HG in hypertensive participants with diabetes, heart failure or chronic kidney disease.;Long Term: Maintenance of blood pressure at goal levels.   Stress Yes   Intervention Offer individual and/or small group education and counseling on adjustment to heart disease, stress management and health-related lifestyle change. Teach  and support self-help strategies.;Refer participants experiencing significant psychosocial distress to appropriate mental health specialists for further evaluation and treatment. When possible, include family members and significant others in education/counseling sessions.   Expected Outcomes Short Term: Participant demonstrates changes in health-related behavior, relaxation and other stress management skills, ability to obtain effective social support, and compliance with psychotropic medications if prescribed.;Long Term: Emotional wellbeing is indicated by absence of clinically significant psychosocial distress or social isolation.   Personal Goal Other Yes   Personal Goal Have more energy; return to normal activities at church and be able to volunteer at soup kitchen.    Intervention Patient will attend PR exercising 2 days/wk and suppment with exercise at home 3 days/wk.   Expected Outcomes Patient will complete the program meeting her personal goals.       Core Components/Risk Factors/Patient Goals Review:    Core  Components/Risk Factors/Patient Goals at Discharge (Final Review):    ITP Comments:     ITP Comments    Row Name 04/15/17 1553           ITP Comments Patient new to program. Plans to start Tuesday 04/20/17.          Comments: ITP 30 Day REVIEW Patient new to program. Plans to start Tuesday 04/20/17.

## 2017-04-16 ENCOUNTER — Emergency Department (HOSPITAL_COMMUNITY)
Admission: EM | Admit: 2017-04-16 | Discharge: 2017-04-16 | Disposition: A | Discharge status: Home / Self Care | Payer: Medicare Other | Attending: Emergency Medicine | Admitting: Emergency Medicine

## 2017-04-16 ENCOUNTER — Encounter (HOSPITAL_COMMUNITY): Payer: Self-pay

## 2017-04-16 ENCOUNTER — Other Ambulatory Visit: Payer: Self-pay

## 2017-04-16 ENCOUNTER — Emergency Department (HOSPITAL_COMMUNITY): Payer: Medicare Other

## 2017-04-16 DIAGNOSIS — Z79899 Other long term (current) drug therapy: Secondary | ICD-10-CM | POA: Diagnosis not present

## 2017-04-16 DIAGNOSIS — Z853 Personal history of malignant neoplasm of breast: Secondary | ICD-10-CM | POA: Diagnosis not present

## 2017-04-16 DIAGNOSIS — E876 Hypokalemia: Secondary | ICD-10-CM | POA: Diagnosis not present

## 2017-04-16 DIAGNOSIS — I517 Cardiomegaly: Secondary | ICD-10-CM | POA: Diagnosis not present

## 2017-04-16 DIAGNOSIS — I10 Essential (primary) hypertension: Secondary | ICD-10-CM | POA: Insufficient documentation

## 2017-04-16 DIAGNOSIS — Z7982 Long term (current) use of aspirin: Secondary | ICD-10-CM | POA: Diagnosis not present

## 2017-04-16 DIAGNOSIS — Z7722 Contact with and (suspected) exposure to environmental tobacco smoke (acute) (chronic): Secondary | ICD-10-CM | POA: Diagnosis not present

## 2017-04-16 DIAGNOSIS — F419 Anxiety disorder, unspecified: Secondary | ICD-10-CM | POA: Diagnosis not present

## 2017-04-16 DIAGNOSIS — R531 Weakness: Secondary | ICD-10-CM | POA: Diagnosis present

## 2017-04-16 DIAGNOSIS — F99 Mental disorder, not otherwise specified: Secondary | ICD-10-CM | POA: Diagnosis not present

## 2017-04-16 LAB — COMPREHENSIVE METABOLIC PANEL
ALT: 14 U/L (ref 14–54)
AST: 17 U/L (ref 15–41)
Albumin: 3.5 g/dL (ref 3.5–5.0)
Alkaline Phosphatase: 65 U/L (ref 38–126)
Anion gap: 9 (ref 5–15)
BUN: 10 mg/dL (ref 6–20)
CALCIUM: 9.2 mg/dL (ref 8.9–10.3)
CHLORIDE: 92 mmol/L — AB (ref 101–111)
CO2: 29 mmol/L (ref 22–32)
Creatinine, Ser: 0.7 mg/dL (ref 0.44–1.00)
GFR calc non Af Amer: >60 mL/min (ref >60)
GLUCOSE: 118 mg/dL — AB (ref 65–99)
Potassium: 3.1 mmol/L — ABNORMAL LOW (ref 3.5–5.1)
SODIUM: 130 mmol/L — AB (ref 135–145)
TOTAL PROTEIN: 7.4 g/dL (ref 6.5–8.1)
Total Bilirubin: 0.4 mg/dL (ref 0.3–1.2)

## 2017-04-16 LAB — CBC WITH DIFFERENTIAL/PLATELET
BASOS ABS: 0 10*3/uL (ref 0.0–0.1)
BASOS PCT: 0 %
EOS ABS: 0.1 10*3/uL (ref 0.0–0.7)
Eosinophils Relative: 1 %
HEMATOCRIT: 33.8 % — AB (ref 36.0–46.0)
Hemoglobin: 11.4 g/dL — ABNORMAL LOW (ref 12.0–15.0)
Lymphocytes Relative: 17 %
Lymphs Abs: 1.1 10*3/uL (ref 0.7–4.0)
MCH: 28.4 pg (ref 26.0–34.0)
MCHC: 33.7 g/dL (ref 30.0–36.0)
MCV: 84.1 fL (ref 78.0–100.0)
MONO ABS: 0.6 10*3/uL (ref 0.1–1.0)
Monocytes Relative: 8 %
Neutro Abs: 5 10*3/uL (ref 1.7–7.7)
Neutrophils Relative %: 74 %
PLATELETS: 401 10*3/uL — AB (ref 150–400)
RBC: 4.02 MIL/uL (ref 3.87–5.11)
RDW: 14.7 % (ref 11.5–15.5)
WBC: 6.7 10*3/uL (ref 4.0–10.5)

## 2017-04-16 LAB — TROPONIN I

## 2017-04-16 LAB — URINALYSIS, ROUTINE W REFLEX MICROSCOPIC
BILIRUBIN URINE: NEGATIVE
Glucose, UA: NEGATIVE mg/dL
Hgb urine dipstick: NEGATIVE
KETONES UR: NEGATIVE mg/dL
Leukocytes, UA: NEGATIVE
NITRITE: NEGATIVE
PROTEIN: NEGATIVE mg/dL
SPECIFIC GRAVITY, URINE: 1.003 — AB (ref 1.005–1.030)
pH: 7 (ref 5.0–8.0)

## 2017-04-16 MED ORDER — POTASSIUM CHLORIDE CRYS ER 20 MEQ PO TBCR
40.0000 meq | EXTENDED_RELEASE_TABLET | Freq: Once | ORAL | Status: AC
  Administered 2017-04-16: 40 meq via ORAL
  Filled 2017-04-16: qty 2

## 2017-04-16 MED ORDER — SODIUM CHLORIDE 0.9 % IV BOLUS (SEPSIS)
500.0000 mL | Freq: Once | INTRAVENOUS | Status: AC
  Administered 2017-04-16: 500 mL via INTRAVENOUS

## 2017-04-16 MED ORDER — POTASSIUM CHLORIDE CRYS ER 20 MEQ PO TBCR: 20.0000 meq | EXTENDED_RELEASE_TABLET | Freq: Every day | ORAL | 0 refills | Status: DC

## 2017-04-16 MED ORDER — SERTRALINE HCL 50 MG PO TABS
25.0000 mg | ORAL_TABLET | Freq: Every day | ORAL | Status: DC
  Administered 2017-04-16: 25 mg via ORAL
  Filled 2017-04-16: qty 1

## 2017-04-16 NOTE — ED Notes (Signed)
Assist pt to bathroom via wheelchair  

## 2017-04-16 NOTE — ED Provider Notes (Signed)
[facility] DEPT Provider Note   CSN: [id number] Arrival date & time: 04/16/17  1816     History   Chief Complaint Chief Complaint  Patient presents with  . Fatigue    HPI Charlotte [name] is a 83 y.o. female.  Patient complains of fatigue. No pain or shortness of breath    Weakness  Primary symptoms include no focal weakness, no loss of sensation. This is a recurrent problem. The current episode started more than 1 week ago. The problem has not changed since onset.There was no focality noted. There has been no fever. Pertinent negatives include no shortness of breath, no chest pain and no headaches. Associated medical issues do not include trauma.    Past Medical History:  Diagnosis Date  . Anxiety   . Arthritis   . Cancer [facility])    breast cancer  . Fatigue   . History of hyperthyroidism    resolved, no medicaions for treatment at this time  . Hypertension   . Insomnia   . OSA (obstructive sleep apnea)     Patient Active Problem List   Diagnosis Date Noted  . Pulmonary HTN ([place]) 12/23/2016  . Arthritis 12/23/2016  . Community acquired pneumonia 12/09/2016  . Essential hypertension 12/09/2016  . 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 Chronic insomnia 11/04/2016  . 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 OSA (obstructive sleep apnea) 11/04/2016  . Snoring 11/04/2016  . Fatigue due to treatment 11/04/2016  . Malignant neoplasm of upper-inner quadrant of left female breast ([place]) 07/01/2016  . Generalized anxiety disorder 01/28/2016  . SHOULDER, ARTHRITIS, DEGEN./OSTEO 12/23/2009  . IMPINGEMENT SYNDROME 12/23/2009  . HIGH BLOOD PRESSURE 12/23/2009    Past Surgical History:  Procedure Laterality Date  . BREAST LUMPECTOMY     right side  . BREAST LUMPECTOMY WITH AXILLARY LYMPH NODE BIOPSY Left 06/17/2016   Procedure: BREAST LUMPECTOMY WITH AXILLARY LYMPH NODE BIOPSY;  Surgeon: Autumn Messing III, MD;  Location: [place];  Service: General;   Laterality: Left;  . COLONOSCOPY    . DILATION AND CURETTAGE OF UTERUS    . EYE SURGERY     bilateral cataracts removed  . FOOT SURGERY    . PORTACATH PLACEMENT Right 07/16/2016   Procedure: INSERTION PORT-A-CATH RIGHT SUBLCLAVIAN;  Surgeon: Autumn Messing III, MD;  Location: [place];  Service: General;  Laterality: Right;  . TONSILLECTOMY      OB History    Gravida Para Term Preterm AB Living   2 2 2     2    SAB TAB Ectopic Multiple Live Births                   Home Medications    Prior to Admission medications   Medication Sig Start Date End Date Taking? Authorizing Provider  anastrozole (ARIMIDEX) 1 MG tablet Take 1 tablet (1 mg total) by mouth daily. Patient taking differently: Take 0.5 mg by mouth daily.  10/13/16  Yes Nicholas Lose, MD  aspirin EC 81 MG tablet Take 81 mg by mouth daily.   Yes Historical Provider, MD  Calcium Carbonate-Vitamin D (CALCIUM 600+D3 PO) Take 1 tablet by mouth 2 (two) times daily.   Yes Historical Provider, MD  carbamide peroxide (DEBROX) 6.5 % otic solution Place 5 drops into both ears daily as needed (ear wax buildup).    Yes Historical Provider, MD  cycloSPORINE (RESTASIS) 0.05 % ophthalmic emulsion Place 1 drop into both eyes 2 (two) times daily.   Yes Historical  Provider, MD  felodipine (PLENDIL) 5 MG 24 hr tablet Take 5 mg by mouth daily. Takes in the afternoon around 1500   Yes Historical Provider, MD  fluticasone (FLONASE) 50 MCG/ACT nasal spray Place 2 sprays into the nose daily as needed for allergies.   Yes Historical Provider, MD  furosemide (LASIX) 20 MG tablet Take 1 tablet (20 mg total) by mouth daily as needed. Patient taking differently: Take 20 mg by mouth daily as needed for fluid or edema.  01/14/17 04/16/17 Yes Arnoldo Lenis, MD  hydrALAZINE (APRESOLINE) 50 MG tablet Take 1 tablet (50 mg total) by mouth 2 (two) times daily. 06/01/16  Yes Lendon Colonel, NP  [facility] PAIN RELIEF PATCH) PADS Apply 1 each topically daily as  needed (pain).   Yes Historical Provider, MD  LORazepam (ATIVAN) 1 MG tablet Take 0.5 mg by mouth every 8 (eight) hours as needed for anxiety or sleep.    Yes Historical Provider, MD  losartan (COZAAR) 100 MG tablet Take 100 mg by mouth daily.   Yes Historical Provider, MD  Multiple Vitamins-Minerals (CENTRUM ADULTS PO) Take 1 tablet by mouth daily.   Yes Historical Provider, MD  non-metallic deodorant Jethro Poling) MISC Apply 1 application topically daily.    Yes Historical Provider, MD  Omega-3 Fatty Acids (FISH OIL TRIPLE STRENGTH) 1400 MG CAPS Take 1 tablet by mouth daily.   Yes Historical Provider, MD  Polyethyl Glycol-Propyl Glycol (SYSTANE) 0.4-0.3 % SOLN Apply 1 drop to eye 2 (two) times daily.   Yes Historical Provider, MD  sertraline (ZOLOFT) 25 MG tablet Take 1 tablet (25 mg total) by mouth daily. 03/15/17  Yes Carmen Dohmeier, MD  tretinoin (RETIN-A) 0.025 % cream Apply 1 application topically at bedtime.  06/27/15  Yes Historical Provider, MD  potassium chloride SA (K-DUR,KLOR-CON) 20 MEQ tablet Take 1 tablet (20 mEq total) by mouth daily. 04/16/17   Milton Ferguson, MD    Family History Family History  Problem Relation Age of Onset  . Heart attack Mother   . Cancer Mother   . Hypertension Other   . Lung disease Neg Hx   . Rheumatologic disease Neg Hx     Social History Social History  Substance Use Topics  . Smoking status: Passive Smoke Exposure - Never Smoker  . Smokeless tobacco: Never Used     Comment: Husband smoked  . Alcohol use No     Allergies   Latex; Bextra [valdecoxib]; and Motrin [ibuprofen]   Review of Systems Review of Systems  Constitutional: Negative for appetite change and fatigue.  HENT: Negative for congestion, ear discharge and sinus pressure.   Eyes: Negative for discharge.  Respiratory: Negative for cough and shortness of breath.   Cardiovascular: Negative for chest pain.  Gastrointestinal: Negative for abdominal pain and diarrhea.  Genitourinary:  Negative for frequency and hematuria.  Musculoskeletal: Negative for back pain.  Skin: Negative for rash.  Neurological: Positive for weakness. Negative for focal weakness, seizures and headaches.  Psychiatric/Behavioral: Negative for hallucinations.     Physical Exam Updated Vital Signs BP (!) 177/66   Pulse 64   Temp 98.6 F (37 C) (Oral)   Resp 13   Wt 125 lb (56.7 kg)   SpO2 96%   BMI 22.86 kg/m   Physical Exam  Constitutional: She is oriented to person, place, and time. She appears well-developed.  HENT:  Head: Normocephalic.  Eyes: Conjunctivae and EOM are normal. No scleral icterus.  Neck: Neck supple. No thyromegaly present.  Cardiovascular: Normal rate and regular rhythm.  Exam reveals no gallop and no friction rub.   No murmur heard. Pulmonary/Chest: No stridor. She has no wheezes. She has no rales. She exhibits no tenderness.  Abdominal: She exhibits no distension. There is no tenderness. There is no rebound.  Musculoskeletal: Normal range of motion. She exhibits no edema.  Lymphadenopathy:    She has no cervical adenopathy.  Neurological: She is oriented to person, place, and time. She exhibits normal muscle tone. Coordination normal.  Skin: No rash noted. No erythema.  Psychiatric: She has a normal mood and affect. Her behavior is normal.     ED Treatments / Results  Labs (all labs ordered are listed, but only abnormal results are displayed) Labs Reviewed  COMPREHENSIVE METABOLIC PANEL - Abnormal; Notable for the following:       Result Value   Sodium 130 (*)    Potassium 3.1 (*)    Chloride 92 (*)    Glucose, Bld 118 (*)    All other components within normal limits  CBC WITH DIFFERENTIAL/PLATELET - Abnormal; Notable for the following:    Hemoglobin 11.4 (*)    HCT 33.8 (*)    Platelets 401 (*)    All other components within normal limits  URINALYSIS, ROUTINE W REFLEX MICROSCOPIC - Abnormal; Notable for the following:    Color, Urine COLORLESS (*)     Specific Gravity, Urine 1.003 (*)    All other components within normal limits  TROPONIN I    EKG  EKG Interpretation  Date/Time:  Friday April 16 2017 20:10:11 EDT Ventricular Rate:  64 PR Interval:    QRS Duration: 107 QT Interval:  402 QTC Calculation: 415 R Axis:   -42 Text Interpretation:  Sinus rhythm Ventricular premature complex LVH with secondary repolarization abnormality Anterior Q waves, possibly due to LVH Confirmed by Christell Steinmiller  MD, Lori Popowski [phone]) on 04/16/2017 10:28:19 PM       Radiology Dg Chest 2 View  Result Date: 04/16/2017 CLINICAL DATA:  Weakness.  History of hypertension and cancer. EXAM: CHEST  2 VIEW COMPARISON:  02/15/2017.  12/15/2016. FINDINGS: Power port in place on the right with tip in the SVC 2 cm above the right atrium. Chronic cardiomegaly. Chronic aortic atherosclerosis. Chronic pulmonary markings, worse on the left than the right. This largely represents scarring related to previous radiation. No sign of new or active infiltrate. No effusion. No acute bone finding. IMPRESSION: Pulmonary scarring left more than right related to previous radiation. No active process identified. Cardiomegaly and aortic atherosclerosis. Electronically Signed   By: Nelson Chimes M.D.   On: 04/16/2017 20:54    Procedures Procedures (including critical care time)  Medications Ordered in ED Medications  sertraline (ZOLOFT) tablet 25 mg (25 mg Oral Given 04/16/17 2115)  potassium chloride SA (K-DUR,KLOR-CON) CR tablet 40 mEq (not administered)  sodium chloride 0.9 % bolus 500 mL (500 mLs Intravenous New Bag/Given 04/16/17 2059)     Initial Impression / Assessment and Plan / ED Course  I have reviewed the triage vital signs and the nursing notes.  Pertinent labs & imaging results that were available during my care of the patient were reviewed by me and considered in my medical decision making (see chart for details).     Labs show potassium was mildly low. Rest of labs  EKG and chest x-ray unremarkable. Patient will be put on potassium replacement and will follow-up with her PCP  Final Clinical Impressions(s) / ED Diagnoses  Final diagnoses:  Hypokalemia    New Prescriptions New Prescriptions   POTASSIUM CHLORIDE SA (K-DUR,KLOR-CON) 20 MEQ TABLET    Take 1 tablet (20 mEq total) by mouth daily.     Milton Ferguson, MD 04/16/17 2248

## 2017-04-16 NOTE — ED Triage Notes (Signed)
Pt brought in by EMS Pt reports that she has been extremely tired and weak. Felt as if she was going to pass out thia afternoon.  has been started on medication for anxiety. Pt takes BP twice daily per as directed by doctor . One systolic reading reading today was 211. Reports appetite is good. Denies pain

## 2017-04-16 NOTE — Discharge Instructions (Signed)
Follow up with your md next week for recheck °

## 2017-04-16 NOTE — ED Notes (Signed)
Advised pt we needed urine sample

## 2017-04-16 NOTE — ED Notes (Signed)
Checked pulse on right dorsalis pedis with dopler. Able to auscultate pulse in that area with rate of 113 bpm.

## 2017-04-20 ENCOUNTER — Encounter (HOSPITAL_COMMUNITY)
Admission: RE | Admit: 2017-04-20 | Discharge: 2017-04-20 | Disposition: A | Discharge status: Home / Self Care | Payer: Medicare Other | Source: Ambulatory Visit | Attending: Pulmonary Disease | Admitting: Pulmonary Disease

## 2017-04-20 DIAGNOSIS — I272 Pulmonary hypertension, unspecified: Secondary | ICD-10-CM | POA: Insufficient documentation

## 2017-04-20 DIAGNOSIS — E0590 Thyrotoxicosis, unspecified without thyrotoxic crisis or storm: Secondary | ICD-10-CM | POA: Insufficient documentation

## 2017-04-20 DIAGNOSIS — F419 Anxiety disorder, unspecified: Secondary | ICD-10-CM | POA: Diagnosis not present

## 2017-04-20 DIAGNOSIS — G4700 Insomnia, unspecified: Secondary | ICD-10-CM | POA: Insufficient documentation

## 2017-04-20 DIAGNOSIS — G4733 Obstructive sleep apnea (adult) (pediatric): Secondary | ICD-10-CM | POA: Diagnosis not present

## 2017-04-20 DIAGNOSIS — M1990 Unspecified osteoarthritis, unspecified site: Secondary | ICD-10-CM | POA: Diagnosis not present

## 2017-04-20 DIAGNOSIS — I10 Essential (primary) hypertension: Secondary | ICD-10-CM | POA: Diagnosis not present

## 2017-04-20 DIAGNOSIS — Z853 Personal history of malignant neoplasm of breast: Secondary | ICD-10-CM | POA: Diagnosis not present

## 2017-04-20 NOTE — Progress Notes (Signed)
Daily Session Note  Patient Details  Name: Charlotte [name] MRN: [id number] Date of Birth: Mar 15, 1928 Referring Provider:     PULMONARY REHAB OTHER RESP ORIENTATION from 04/15/2017 in [place]  Referring Provider  Dr. Ashok Cordia      Encounter Date: 04/20/2017  Check In:     Session Check In - 04/20/17 1330      Check-In   Location [facility]   Staff Present Russella Dar, MS, EP, [facility], Exercise Physiologist   Supervising physician immediately available to respond to emergencies See telemetry face sheet for immediately available MD   Medication changes reported     No   Fall or balance concerns reported    No   Tobacco Cessation --  Non-smoker   Warm-up and Cool-down Performed as group-led instruction   Resistance Training Performed No  Unable to use weights or bands.    VAD Patient? No     Pain Assessment   Currently in Pain? No/denies   Pain Score 0-No pain   Multiple Pain Sites No      Capillary Blood Glucose: No results found for this or any previous visit (from the past 24 hour(s)).    History  Smoking Status  . Passive Smoke Exposure - Never Smoker  Smokeless Tobacco  . Never Used    Comment: Husband smoked    Goals Met:  Proper associated with RPD/PD & O2 Sat Independence with exercise equipment Improved SOB with ADL's Exercise tolerated well Queuing for purse lip breathing Strength training completed today  Goals Unmet:  Not Applicable  Comments: Check out: 1430   Dr. Sinda Du is Medical Director for [facility] Pulmonary Rehab.

## 2017-04-22 ENCOUNTER — Encounter (HOSPITAL_COMMUNITY)
Admission: RE | Admit: 2017-04-22 | Discharge: 2017-04-22 | Disposition: A | Discharge status: Home / Self Care | Payer: Medicare Other | Source: Ambulatory Visit | Attending: Pulmonary Disease | Admitting: Pulmonary Disease

## 2017-04-22 DIAGNOSIS — G4733 Obstructive sleep apnea (adult) (pediatric): Secondary | ICD-10-CM | POA: Diagnosis not present

## 2017-04-22 DIAGNOSIS — G4700 Insomnia, unspecified: Secondary | ICD-10-CM | POA: Diagnosis not present

## 2017-04-22 DIAGNOSIS — F419 Anxiety disorder, unspecified: Secondary | ICD-10-CM | POA: Diagnosis not present

## 2017-04-22 DIAGNOSIS — M1990 Unspecified osteoarthritis, unspecified site: Secondary | ICD-10-CM | POA: Diagnosis not present

## 2017-04-22 DIAGNOSIS — I10 Essential (primary) hypertension: Secondary | ICD-10-CM | POA: Diagnosis not present

## 2017-04-22 DIAGNOSIS — I272 Pulmonary hypertension, unspecified: Secondary | ICD-10-CM | POA: Diagnosis not present

## 2017-04-22 NOTE — Progress Notes (Signed)
Daily Session Note  Patient Details  Name: Charlotte [name] MRN: [id number] Date of Birth: 1928-11-25 Referring Provider:     PULMONARY REHAB OTHER RESP ORIENTATION from 04/15/2017 in [place]  Referring Provider  Dr. Ashok Cordia      Encounter Date: 04/22/2017  Check In:     Session Check In - 04/22/17 1330      Check-In   Location [facility]   Staff Present Daryl Quiros Angelina Pih, MS, EP, [facility], Exercise Physiologist;Debra Wynetta Emery, RN, BSN   Supervising physician immediately available to respond to emergencies See telemetry face sheet for immediately available MD   Medication changes reported     No   Fall or balance concerns reported    No   Warm-up and Cool-down Performed as group-led instruction   Resistance Training Performed No   VAD Patient? No     Pain Assessment   Currently in Pain? No/denies   Pain Score 0-No pain   Multiple Pain Sites No      Capillary Blood Glucose: No results found for this or any previous visit (from the past 24 hour(s)).    History  Smoking Status  . Passive Smoke Exposure - Never Smoker  Smokeless Tobacco  . Never Used    Comment: Husband smoked    Goals Met:  Proper associated with RPD/PD & O2 Sat Independence with exercise equipment Improved SOB with ADL's Queuing for purse lip breathing Strength training completed today  Goals Unmet:  Not Applicable  Comments: Check Out: 1430   Dr. Sinda Du is Medical Director for [facility] Pulmonary Rehab.

## 2017-04-26 ENCOUNTER — Telehealth: Payer: Self-pay | Admitting: *Deleted

## 2017-04-26 ENCOUNTER — Telehealth: Payer: Self-pay

## 2017-04-26 DIAGNOSIS — I10 Essential (primary) hypertension: Secondary | ICD-10-CM | POA: Diagnosis not present

## 2017-04-26 DIAGNOSIS — G4733 Obstructive sleep apnea (adult) (pediatric): Secondary | ICD-10-CM | POA: Diagnosis not present

## 2017-04-26 DIAGNOSIS — R42 Dizziness and giddiness: Secondary | ICD-10-CM | POA: Diagnosis not present

## 2017-04-26 DIAGNOSIS — E559 Vitamin D deficiency, unspecified: Secondary | ICD-10-CM | POA: Diagnosis not present

## 2017-04-26 DIAGNOSIS — R300 Dysuria: Secondary | ICD-10-CM | POA: Diagnosis not present

## 2017-04-26 DIAGNOSIS — R7309 Other abnormal glucose: Secondary | ICD-10-CM | POA: Diagnosis not present

## 2017-04-26 DIAGNOSIS — C50912 Malignant neoplasm of unspecified site of left female breast: Secondary | ICD-10-CM | POA: Diagnosis not present

## 2017-04-26 DIAGNOSIS — I272 Pulmonary hypertension, unspecified: Secondary | ICD-10-CM | POA: Diagnosis not present

## 2017-04-26 DIAGNOSIS — M1990 Unspecified osteoarthritis, unspecified site: Secondary | ICD-10-CM | POA: Diagnosis not present

## 2017-04-26 DIAGNOSIS — M609 Myositis, unspecified: Secondary | ICD-10-CM | POA: Diagnosis not present

## 2017-04-26 DIAGNOSIS — R634 Abnormal weight loss: Secondary | ICD-10-CM | POA: Diagnosis not present

## 2017-04-26 DIAGNOSIS — R8290 Unspecified abnormal findings in urine: Secondary | ICD-10-CM | POA: Diagnosis not present

## 2017-04-26 DIAGNOSIS — R5383 Other fatigue: Secondary | ICD-10-CM | POA: Diagnosis not present

## 2017-04-26 NOTE — Telephone Encounter (Signed)
"  I am tired and weak.  Is there another medicine I can take instead of the half a pill of anastrozole.  Cal me at [phone].  My daughter/caretaker will be here in an hour [phone])."   Conversation slow, sounds winded.  "I just walked from  The bathroom to my bedroom.  Become short of breath when I move around.  I can hardly make it to my therapy appointments.  Anastrozole was started 10-13-2016.  I started Zoloft in March for anxiety."

## 2017-04-26 NOTE — Telephone Encounter (Signed)
Called pt back to let her know that she may stop her anastrozole for 1 week and call back to update with her symptoms of weakness. Advised pt to reach out to her pcp today to let them know of her generalized weakness and sob. Pt states that she had just started taking zoloft for anxiety at night since end of march. Pt also states that her bp medication was recently adjusted and started to have some increased in shortness of breath on exertion. Pt states that she suffers from anxiety and has not been sleeping through the night, even with zoloft and ativan. Pt wears cpap at night, as advised, but still wakes up very fatigue. Pt states that she was also recently in the ED for low potassium, which she is now supplementing at home. Encouraged pt to contact her pcp today to let them know of pt symptoms. Pt and caretaker were both on the phone and verbalized understanding. Pt will call back next Monday to update of pt symptoms. No further concerns at this time. Pt denies any chest pain, dizziness, numbness/tingling. Told pt to go to ED if symptoms gets worse or sob get worse.

## 2017-04-27 ENCOUNTER — Encounter (HOSPITAL_COMMUNITY): Admission: RE | Admit: 2017-04-27 | Payer: Medicare Other | Source: Ambulatory Visit

## 2017-04-29 ENCOUNTER — Encounter (HOSPITAL_COMMUNITY)
Admission: RE | Admit: 2017-04-29 | Discharge: 2017-04-29 | Disposition: A | Discharge status: Home / Self Care | Payer: Medicare Other | Source: Ambulatory Visit | Attending: Pulmonary Disease | Admitting: Pulmonary Disease

## 2017-04-29 DIAGNOSIS — I10 Essential (primary) hypertension: Secondary | ICD-10-CM | POA: Diagnosis not present

## 2017-04-29 DIAGNOSIS — G4700 Insomnia, unspecified: Secondary | ICD-10-CM | POA: Diagnosis not present

## 2017-04-29 DIAGNOSIS — G4733 Obstructive sleep apnea (adult) (pediatric): Secondary | ICD-10-CM | POA: Diagnosis not present

## 2017-04-29 DIAGNOSIS — F419 Anxiety disorder, unspecified: Secondary | ICD-10-CM | POA: Diagnosis not present

## 2017-04-29 DIAGNOSIS — I272 Pulmonary hypertension, unspecified: Secondary | ICD-10-CM

## 2017-04-29 DIAGNOSIS — M1990 Unspecified osteoarthritis, unspecified site: Secondary | ICD-10-CM | POA: Diagnosis not present

## 2017-04-29 NOTE — Progress Notes (Signed)
Daily Session Note  Patient Details  Name: Charlotte [name] MRN: [id number] Date of Birth: February 10, 1928 Referring Provider:     PULMONARY REHAB OTHER RESP ORIENTATION from 04/15/2017 in [place]  Referring Provider  Dr. Ashok Cordia      Encounter Date: 04/29/2017  Check In:     Session Check In - 04/29/17 1400      Check-In   Location [facility]   Staff Present Aundra Dubin, RN, BSN;Jarret Torre Luther Parody, BS, EP, Exercise Physiologist   Supervising physician immediately available to respond to emergencies See telemetry face sheet for immediately available MD   Medication changes reported     No   Fall or balance concerns reported    No   Warm-up and Cool-down Performed as group-led instruction   Resistance Training Performed Yes   VAD Patient? No     Pain Assessment   Currently in Pain? No/denies   Pain Score 0-No pain   Multiple Pain Sites No      Capillary Blood Glucose: No results found for this or any previous visit (from the past 24 hour(s)).    History  Smoking Status  . Passive Smoke Exposure - Never Smoker  Smokeless Tobacco  . Never Used    Comment: Husband smoked    Goals Met:  Independence with exercise equipment Improved SOB with ADL's Using PLB without cueing & demonstrates good technique Exercise tolerated well No report of cardiac concerns or symptoms Strength training completed today  Goals Unmet:  Not Applicable  Comments: Check out 230   Dr. Sinda Du is Medical Director for [facility] Pulmonary Rehab.

## 2017-05-03 ENCOUNTER — Telehealth: Payer: Self-pay | Admitting: *Deleted

## 2017-05-03 NOTE — Telephone Encounter (Signed)
Call from pt reporting she feels better since stopping Arimidex. Pt reports she is not as weak and tired. She has been without it for one week. Pt reports she saw PCP and the fatigue was thought to be related to Arimidex. Pt asks what she should do next. Instructed her to HOLD Arimidex until she hears back from this office. She and daughter voiced understanding. Message to MD for further instructions.

## 2017-05-03 NOTE — Telephone Encounter (Signed)
Please have her start back on it at half a pill daily Thanks

## 2017-05-04 ENCOUNTER — Encounter (HOSPITAL_COMMUNITY): Payer: Medicare Other

## 2017-05-04 NOTE — Telephone Encounter (Signed)
Spoke with patient's daughter; instructed her per Dr Lindi Adie for her mother to restart the Arimidex at a 1/2 a tablet daily; advised she can restart this on 5/21. Advised for her to keep all scheduled appointments and to call in 2-3 weeks to report how she is tolerating this. Patient's daughter verbalized understanding.

## 2017-05-06 ENCOUNTER — Encounter (HOSPITAL_COMMUNITY): Payer: Medicare Other

## 2017-05-06 DIAGNOSIS — Z853 Personal history of malignant neoplasm of breast: Secondary | ICD-10-CM | POA: Diagnosis not present

## 2017-05-06 DIAGNOSIS — Z452 Encounter for adjustment and management of vascular access device: Secondary | ICD-10-CM | POA: Diagnosis not present

## 2017-05-10 DIAGNOSIS — C50912 Malignant neoplasm of unspecified site of left female breast: Secondary | ICD-10-CM | POA: Diagnosis not present

## 2017-05-10 DIAGNOSIS — F339 Major depressive disorder, recurrent, unspecified: Secondary | ICD-10-CM | POA: Diagnosis not present

## 2017-05-10 DIAGNOSIS — I10 Essential (primary) hypertension: Secondary | ICD-10-CM | POA: Diagnosis not present

## 2017-05-10 DIAGNOSIS — F411 Generalized anxiety disorder: Secondary | ICD-10-CM | POA: Diagnosis not present

## 2017-05-11 ENCOUNTER — Encounter (HOSPITAL_COMMUNITY)
Admission: RE | Admit: 2017-05-11 | Discharge: 2017-05-11 | Disposition: A | Discharge status: Home / Self Care | Payer: Medicare Other | Source: Ambulatory Visit | Attending: Pulmonary Disease | Admitting: Pulmonary Disease

## 2017-05-13 ENCOUNTER — Encounter (HOSPITAL_COMMUNITY): Payer: Medicare Other

## 2017-05-13 NOTE — Progress Notes (Signed)
Pulmonary Individual Treatment Plan  Patient Details  Name: Charlotte [name] MRN: [id number] Date of Birth: 10-05-1928 Referring Provider:     PULMONARY REHAB OTHER RESP ORIENTATION from 04/15/2017 in [place]  Referring Provider  Dr. Ashok Cordia      Initial Encounter Date:    [place] from 04/15/2017 in [place]  Date  04/15/17  Referring Provider  Dr. Ashok Cordia      Visit Diagnosis: Pulmonary hypertension ([place])  Patient's Home Medications on Admission:   Current Outpatient Prescriptions:  .  anastrozole (ARIMIDEX) 1 MG tablet, Take 1 tablet (1 mg total) by mouth daily. (Patient taking differently: Take 0.5 mg by mouth daily. ), Disp: 90 tablet, Rfl: 3 .  aspirin EC 81 MG tablet, Take 81 mg by mouth daily., Disp: , Rfl:  .  Calcium Carbonate-Vitamin D (CALCIUM 600+D3 PO), Take 1 tablet by mouth 2 (two) times daily., Disp: , Rfl:  .  carbamide peroxide (DEBROX) 6.5 % otic solution, Place 5 drops into both ears daily as needed (ear wax buildup). , Disp: , Rfl:  .  cycloSPORINE (RESTASIS) 0.05 % ophthalmic emulsion, Place 1 drop into both eyes 2 (two) times daily., Disp: , Rfl:  .  felodipine (PLENDIL) 5 MG 24 hr tablet, Take 5 mg by mouth daily. Takes in the afternoon around 1500, Disp: , Rfl:  .  fluticasone (FLONASE) 50 MCG/ACT nasal spray, Place 2 sprays into the nose daily as needed for allergies., Disp: , Rfl:  .  furosemide (LASIX) 20 MG tablet, Take 1 tablet (20 mg total) by mouth daily as needed. (Patient taking differently: Take 20 mg by mouth daily as needed for fluid or edema. ), Disp: 90 tablet, Rfl: 3 .  hydrALAZINE (APRESOLINE) 50 MG tablet, Take 1 tablet (50 mg total) by mouth 2 (two) times daily., Disp: 180 tablet, Rfl: 3 .  Liniments (SALONPAS PAIN RELIEF PATCH) PADS, Apply 1 each topically daily as needed (pain)., Disp: , Rfl:  .  LORazepam (ATIVAN) 1 MG tablet, Take 0.5 mg by mouth every 8 (eight)  hours as needed for anxiety or sleep. , Disp: , Rfl:  .  losartan (COZAAR) 100 MG tablet, Take 100 mg by mouth daily., Disp: , Rfl:  .  Multiple Vitamins-Minerals (CENTRUM ADULTS PO), Take 1 tablet by mouth daily., Disp: , Rfl:  .  non-metallic deodorant (ALRA) MISC, Apply 1 application topically daily. , Disp: , Rfl:  .  Omega-3 Fatty Acids (FISH OIL TRIPLE STRENGTH) 1400 MG CAPS, Take 1 tablet by mouth daily., Disp: , Rfl:  .  Polyethyl Glycol-Propyl Glycol (SYSTANE) 0.4-0.3 % SOLN, Apply 1 drop to eye 2 (two) times daily., Disp: , Rfl:  .  potassium chloride SA (K-DUR,KLOR-CON) 20 MEQ tablet, Take 1 tablet (20 mEq total) by mouth daily., Disp: 3 tablet, Rfl: 0 .  sertraline (ZOLOFT) 25 MG tablet, Take 1 tablet (25 mg total) by mouth daily., Disp: 30 tablet, Rfl: 5 .  tretinoin (RETIN-A) 0.025 % cream, Apply 1 application topically at bedtime. , Disp: , Rfl: 3  Past Medical History: Past Medical History:  Diagnosis Date  . Anxiety   . Arthritis   . Cancer [facility])    breast cancer  . Fatigue   . History of hyperthyroidism    resolved, no medicaions for treatment at this time  . Hypertension   . Insomnia   . OSA (obstructive sleep apnea)     Tobacco Use: History  Smoking Status  .  Passive Smoke Exposure - Never Smoker  Smokeless Tobacco  . Never Used    Comment: Husband smoked    Labs: Recent Review Flowsheet Data    Labs for ITP Cardiac and Pulmonary Rehab Latest Ref Rng & Units 11/04/2015   PHART 7.350 - 7.450 7.649(HH)   PCO2ART 35.0 - 45.0 mmHg 22.8(L)   HCO3 20.0 - 24.0 mEq/L 29.5(H)   O2SAT % 99.0      Capillary Blood Glucose: Lab Results  Component Value Date   GLUCAP 105 (H) 12/10/2016   GLUCAP 112 (H) 12/09/2016     ADL UCSD:     Pulmonary Assessment Scores    Row Name 04/15/17 1522         ADL UCSD   ADL Phase Entry     SOB Score total 47     Rest 0     Walk 2     Stairs 3     Bath 3     Dress 3     Shop 3       CAT Score   CAT Score 3        mMRC Score   mMRC Score 12        Pulmonary Function Assessment:   Exercise Target Goals:    Exercise Program Goal: Individual exercise prescription set with THRR, safety & activity barriers. Participant demonstrates ability to understand and report RPE using BORG scale, to self-measure pulse accurately, and to acknowledge the importance of the exercise prescription.  Exercise Prescription Goal: Starting with aerobic activity 30 plus minutes a day, 3 days per week for initial exercise prescription. Provide home exercise prescription and guidelines that participant acknowledges understanding prior to discharge.  Activity Barriers & Risk Stratification:     Activity Barriers & Cardiac Risk Stratification - 04/15/17 1524      Activity Barriers & Cardiac Risk Stratification   Activity Barriers Back Problems;Shortness of Breath   Cardiac Risk Stratification High      6 Minute Walk:     6 Minute Walk    Row Name 04/15/17 1429         6 Minute Walk   Phase Initial     Distance 950 feet     Distance % Change 0 %     Walk Time 6 minutes     # of Rest Breaks 0     MPH 1.79     METS 2.37     RPE 11     Perceived Dyspnea  9     VO2 Peak 6.31     Symptoms No     Resting HR 72 bpm     Resting BP 190/70     Max Ex. HR 102 bpm     Max Ex. BP 164/68     2 Minute Post BP 170/68        Oxygen Initial Assessment:     Oxygen Initial Assessment - 04/15/17 1524      Home Oxygen   Home Oxygen Device None   Sleep Oxygen Prescription None   Home Exercise Oxygen Prescription None   Home at Rest Exercise Oxygen Prescription None     Initial 6 min Walk   Oxygen Used None     Program Oxygen Prescription   Program Oxygen Prescription None      Oxygen Re-Evaluation:   Oxygen Discharge (Final Oxygen Re-Evaluation):   Initial Exercise Prescription:     Initial Exercise Prescription - 04/15/17 1400  Date of Initial Exercise RX and Referring Provider    Date 04/15/17   Referring Provider Dr. Ashok Cordia     NuStep   Level 2   SPM 6   Minutes 30   METs 1.6     Prescription Details   Frequency (times per week) 2   Duration Progress to 30 minutes of continuous aerobic without signs/symptoms of physical distress     Intensity   THRR 40-80% of Max Heartrate [phone]   Ratings of Perceived Exertion 11-13   Perceived Dyspnea 0-4     Progression   Progression Continue progressive overload as per policy without signs/symptoms or physical distress.     Resistance Training   Training Prescription Yes   Weight 1   Reps 10-15      Perform Capillary Blood Glucose checks as needed.  Exercise Prescription Changes:      Exercise Prescription Changes    Row Name 05/10/17 1400             Response to Exercise   Blood Pressure (Admit) 126/74       Blood Pressure (Exercise) 142/68       Blood Pressure (Exit) 128/70       Heart Rate (Admit) 81 bpm       Heart Rate (Exercise) 96 bpm       Heart Rate (Exit) 80 bpm       Oxygen Saturation (Admit) 93 %       Oxygen Saturation (Exercise) 90 %       Oxygen Saturation (Exit) 92 %       Rating of Perceived Exertion (Exercise) 14       Perceived Dyspnea (Exercise) 13       Duration Progress to 30 minutes of  aerobic without signs/symptoms of physical distress       Intensity THRR unchanged         Progression   Progression Continue to progress workloads to maintain intensity without signs/symptoms of physical distress.         Resistance Training   Training Prescription Yes       Weight 1       Reps 10-15         NuStep   Level 1       SPM 10       Minutes 30       METs 3.29         Home Exercise Plan   Plans to continue exercise at Home (comment)       Frequency Add 2 additional days to program exercise sessions.          Exercise Comments:      Exercise Comments    Row Name 05/10/17 1445           Exercise Comments Patient has not increased in PR due to lack of  attendance due to health and personal issues.           Exercise Goals and Review:      Exercise Goals    Row Name 04/15/17 1525             Exercise Goals   Increase Physical Activity Yes       Intervention Provide advice, education, support and counseling about physical activity/exercise needs.;Develop an individualized exercise prescription for aerobic and resistive training based on initial evaluation findings, risk stratification, comorbidities and participant's personal goals.       Expected Outcomes Achievement of increased cardiorespiratory fitness  and enhanced flexibility, muscular endurance and strength shown through measurements of functional capacity and personal statement of participant.       Increase Strength and Stamina Yes       Intervention Provide advice, education, support and counseling about physical activity/exercise needs.;Develop an individualized exercise prescription for aerobic and resistive training based on initial evaluation findings, risk stratification, comorbidities and participant's personal goals.       Expected Outcomes Achievement of increased cardiorespiratory fitness and enhanced flexibility, muscular endurance and strength shown through measurements of functional capacity and personal statement of participant.          Exercise Goals Re-Evaluation :   Discharge Exercise Prescription (Final Exercise Prescription Changes):     Exercise Prescription Changes - 05/10/17 1400      Response to Exercise   Blood Pressure (Admit) 126/74   Blood Pressure (Exercise) 142/68   Blood Pressure (Exit) 128/70   Heart Rate (Admit) 81 bpm   Heart Rate (Exercise) 96 bpm   Heart Rate (Exit) 80 bpm   Oxygen Saturation (Admit) 93 %   Oxygen Saturation (Exercise) 90 %   Oxygen Saturation (Exit) 92 %   Rating of Perceived Exertion (Exercise) 14   Perceived Dyspnea (Exercise) 13   Duration Progress to 30 minutes of  aerobic without signs/symptoms of physical  distress   Intensity THRR unchanged     Progression   Progression Continue to progress workloads to maintain intensity without signs/symptoms of physical distress.     Resistance Training   Training Prescription Yes   Weight 1   Reps 10-15     NuStep   Level 1   SPM 10   Minutes 30   METs 3.29     Home Exercise Plan   Plans to continue exercise at Home (comment)   Frequency Add 2 additional days to program exercise sessions.      Nutrition:  Target Goals: Understanding of nutrition guidelines, daily intake of sodium 1500mg , cholesterol 200mg , calories 30% from fat and 7% or less from saturated fats, daily to have 5 or more servings of fruits and vegetables.  Biometrics:     Pre Biometrics - 04/15/17 1431      Pre Biometrics   Height 5\' 2"  (1.575 m)   Waist Circumference 29 inches   Hip Circumference 33.5 inches   Waist to Hip Ratio 0.87 %   Triceps Skinfold 13 mm   % Body Fat 32.1 %   Grip Strength --  Machine Broken   Flexibility 0 in   Single Leg Stand 2 seconds       Nutrition Therapy Plan and Nutrition Goals:   Nutrition Discharge: Rate Your Plate Scores:     Nutrition Assessments - 04/15/17 1525      Rate Your Plate Scores   Pre Score 55   Pre Score % 55 %      Nutrition Goals Re-Evaluation:   Nutrition Goals Discharge (Final Nutrition Goals Re-Evaluation):   Psychosocial: Target Goals: Acknowledge presence or absence of significant depression and/or stress, maximize coping skills, provide positive support system. Participant is able to verbalize types and ability to use techniques and skills needed for reducing stress and depression.  Initial Review & Psychosocial Screening:     Initial Psych Review & Screening - 04/15/17 1527      Initial Review   Current issues with Current Depression;Current Anxiety/Panic     Family Dynamics   Good Support System? Yes   Comments Patient has depression and anxiety  with panic attacks. She is  being treated with Zoloft and Ativan.     Barriers   Psychosocial barriers to participate in program The patient should benefit from training in stress management and relaxation.;Psychosocial barriers identified (see note)     Screening Interventions   Interventions Encouraged to exercise      Quality of Life Scores:     Quality of Life - 04/15/17 1511      Quality of Life Scores   Health/Function Pre 18 %   Socioeconomic Pre 30 %   Psych/Spiritual Pre 26.57 %   Family Pre 30 %   GLOBAL Pre 23.25 %      PHQ-9: Recent Review Flowsheet Data    Depression screen [facility] 2/9 04/15/2017 09/22/2016   Decreased Interest 1 0   Down, Depressed, Hopeless 1 0   PHQ - 2 Score 2 0   Altered sleeping 3 -   Tired, decreased energy 3 -   Change in appetite 0 -   Feeling bad or failure about yourself  0 -   Trouble concentrating 1 -   Moving slowly or fidgety/restless 1 -   Suicidal thoughts 0 -   PHQ-9 Score 10 -   Difficult doing work/chores Somewhat difficult -     Interpretation of Total Score  Total Score Depression Severity:  1-4 = Minimal depression, 5-9 = Mild depression, 10-14 = Moderate depression, 15-19 = Moderately severe depression, 20-27 = Severe depression   Psychosocial Evaluation and Intervention:     Psychosocial Evaluation - 04/15/17 1528      Psychosocial Evaluation & Interventions   Interventions Relaxation education;Encouraged to exercise with the program and follow exercise prescription;Stress management education   Comments Patient's PHQ-9 score was 10 and his QOL score    Continue Psychosocial Services  Follow up required by staff      Psychosocial Re-Evaluation:   Psychosocial Discharge (Final Psychosocial Re-Evaluation):    Education: Education Goals: Education classes will be provided on a weekly basis, covering required topics. Participant will state understanding/return demonstration of topics presented.  Learning Barriers/Preferences:      Learning Barriers/Preferences - 04/15/17 1521      Learning Barriers/Preferences   Learning Barriers None   Learning Preferences Written Material;Skilled Demonstration      Education Topics: How Lungs Work and Diseases: - Discuss the anatomy of the lungs and diseases that can affect the lungs, such as COPD.   PULMONARY REHAB OTHER RESPIRATORY from 04/29/2017 in [place]  Date  04/22/17  Educator  Lisabeth Register  Instruction Review Code  2- meets goals/outcomes      Exercise: -Discuss the importance of exercise, FITT principles of exercise, normal and abnormal responses to exercise, and how to exercise safely.   PULMONARY REHAB OTHER RESPIRATORY from 04/29/2017 in [place]  Date  04/29/17  Educator  Nils Flack  Instruction Review Code  2- meets goals/outcomes      Environmental Irritants: -Discuss types of environmental irritants and how to limit exposure to environmental irritants.   Meds/Inhalers and oxygen: - Discuss respiratory medications, definition of an inhaler and oxygen, and the proper way to use an inhaler and oxygen.   Energy Saving Techniques: - Discuss methods to conserve energy and decrease shortness of breath when performing activities of daily living.    Bronchial Hygiene / Breathing Techniques: - Discuss breathing mechanics, pursed-lip breathing technique,  proper posture, effective ways to clear airways, and other functional breathing techniques   Cleaning Equipment: -  Provides group verbal and written instruction about the health risks of elevated stress, cause of high stress, and healthy ways to reduce stress.   Nutrition I: Fats: - Discuss the types of cholesterol, what cholesterol does to the body, and how cholesterol levels can be controlled.   Nutrition II: Labels: -Discuss the different components of food labels and how to read food labels.   Respiratory Infections: - Discuss the signs  and symptoms of respiratory infections, ways to prevent respiratory infections, and the importance of seeking medical treatment when having a respiratory infection.   Stress I: Signs and Symptoms: - Discuss the causes of stress, how stress may lead to anxiety and depression, and ways to limit stress.   Stress II: Relaxation: -Discuss relaxation techniques to limit stress.   Oxygen for Home/Travel: - Discuss how to prepare for travel when on oxygen and proper ways to transport and store oxygen to ensure safety.   Knowledge Questionnaire Score:     Knowledge Questionnaire Score - 04/15/17 1521      Knowledge Questionnaire Score   Pre Score 9/14      Core Components/Risk Factors/Patient Goals at Admission:     Personal Goals and Risk Factors at Admission - 04/15/17 1525      Core Components/Risk Factors/Patient Goals on Admission    Weight Management Weight Maintenance   Improve shortness of breath with ADL's Yes   Intervention Provide education, individualized exercise plan and daily activity instruction to help decrease symptoms of SOB with activities of daily living.   Expected Outcomes Short Term: Achieves a reduction of symptoms when performing activities of daily living.   Develop more efficient breathing techniques such as purse lipped breathing and diaphragmatic breathing; and practicing self-pacing with activity Yes   Intervention Provide education, demonstration and support about specific breathing techniuqes utilized for more efficient breathing. Include techniques such as pursed lipped breathing, diaphragmatic breathing and self-pacing activity.   Expected Outcomes Short Term: Participant will be able to demonstrate and use breathing techniques as needed throughout daily activities.   Hypertension Yes   Intervention Provide education on lifestyle modifcations including regular physical activity/exercise, weight management, moderate sodium restriction and increased  consumption of fresh fruit, vegetables, and low fat dairy, alcohol moderation, and smoking cessation.;Monitor prescription use compliance.   Expected Outcomes Short Term: Continued assessment and intervention until BP is < 140/4mm HG in hypertensive participants. < 130/23mm HG in hypertensive participants with diabetes, heart failure or chronic kidney disease.;Long Term: Maintenance of blood pressure at goal levels.   Stress Yes   Intervention Offer individual and/or small group education and counseling on adjustment to heart disease, stress management and health-related lifestyle change. Teach and support self-help strategies.;Refer participants experiencing significant psychosocial distress to appropriate mental health specialists for further evaluation and treatment. When possible, include family members and significant others in education/counseling sessions.   Expected Outcomes Short Term: Participant demonstrates changes in health-related behavior, relaxation and other stress management skills, ability to obtain effective social support, and compliance with psychotropic medications if prescribed.;Long Term: Emotional wellbeing is indicated by absence of clinically significant psychosocial distress or social isolation.   Personal Goal Other Yes   Personal Goal Have more energy; return to normal activities at church and be able to volunteer at soup kitchen.    Intervention Patient will attend PR exercising 2 days/wk and suppment with exercise at home 3 days/wk.   Expected Outcomes Patient will complete the program meeting her personal goals.  Core Components/Risk Factors/Patient Goals Review:    Core Components/Risk Factors/Patient Goals at Discharge (Final Review):    ITP Comments:     ITP Comments    Row Name 04/15/17 1553 05/13/17 1433         ITP Comments Patient new to program. Plans to start Tuesday 04/20/17. Patient new to program completing 4 sessions. She has missed the  last 2 weeks. She had a port-a-cath removed last week. Will continue to monitor for progress.          Comments: ITP 30 Day REVIEW Patient new to program completing 4 sessions. She has missed the last 2 weeks. She had a port-a-cath removed last week. Will continue to monitor for progress.

## 2017-05-18 ENCOUNTER — Encounter (HOSPITAL_COMMUNITY): Payer: Medicare Other

## 2017-05-20 ENCOUNTER — Encounter (HOSPITAL_COMMUNITY): Payer: Medicare Other

## 2017-05-25 ENCOUNTER — Encounter (HOSPITAL_COMMUNITY)
Admission: RE | Admit: 2017-05-25 | Discharge: 2017-05-25 | Disposition: A | Discharge status: Home / Self Care | Payer: Medicare Other | Source: Ambulatory Visit | Attending: Pulmonary Disease | Admitting: Pulmonary Disease

## 2017-05-25 DIAGNOSIS — I10 Essential (primary) hypertension: Secondary | ICD-10-CM | POA: Insufficient documentation

## 2017-05-25 DIAGNOSIS — Z853 Personal history of malignant neoplasm of breast: Secondary | ICD-10-CM | POA: Insufficient documentation

## 2017-05-25 DIAGNOSIS — E0590 Thyrotoxicosis, unspecified without thyrotoxic crisis or storm: Secondary | ICD-10-CM | POA: Insufficient documentation

## 2017-05-25 DIAGNOSIS — M1990 Unspecified osteoarthritis, unspecified site: Secondary | ICD-10-CM | POA: Insufficient documentation

## 2017-05-25 DIAGNOSIS — G4733 Obstructive sleep apnea (adult) (pediatric): Secondary | ICD-10-CM | POA: Insufficient documentation

## 2017-05-25 DIAGNOSIS — I272 Pulmonary hypertension, unspecified: Secondary | ICD-10-CM | POA: Insufficient documentation

## 2017-05-25 DIAGNOSIS — G4700 Insomnia, unspecified: Secondary | ICD-10-CM | POA: Insufficient documentation

## 2017-05-25 DIAGNOSIS — F419 Anxiety disorder, unspecified: Secondary | ICD-10-CM | POA: Insufficient documentation

## 2017-05-27 ENCOUNTER — Encounter: Payer: Self-pay | Admitting: Pulmonary Disease

## 2017-05-27 ENCOUNTER — Ambulatory Visit (INDEPENDENT_AMBULATORY_CARE_PROVIDER_SITE_OTHER): Payer: Medicare Other | Admitting: Pulmonary Disease

## 2017-05-27 ENCOUNTER — Encounter (HOSPITAL_COMMUNITY): Payer: Medicare Other

## 2017-05-27 VITALS — BP 180/80 | HR 67 | Ht 62.0 in | Wt 121.2 lb

## 2017-05-27 DIAGNOSIS — Z9989 Dependence on other enabling machines and devices: Secondary | ICD-10-CM

## 2017-05-27 DIAGNOSIS — I272 Pulmonary hypertension, unspecified: Secondary | ICD-10-CM

## 2017-05-27 DIAGNOSIS — R918 Other nonspecific abnormal finding of lung field: Secondary | ICD-10-CM | POA: Diagnosis not present

## 2017-05-27 DIAGNOSIS — I2729 Other secondary pulmonary hypertension: Secondary | ICD-10-CM

## 2017-05-27 DIAGNOSIS — G4733 Obstructive sleep apnea (adult) (pediatric): Secondary | ICD-10-CM | POA: Diagnosis not present

## 2017-05-27 NOTE — Addendum Note (Signed)
Encounter addended by: Dwana Melena, RN on: 05/27/2017  3:30 PM<BR>    Actions taken: Visit Navigator Flowsheet section accepted, Sign clinical note

## 2017-05-27 NOTE — Progress Notes (Signed)
Discharge Summary  Patient Details  Name: Charlotte [name] MRN: [id number] Date of Birth: Nov 25, 1928 Referring Provider:     PULMONARY REHAB OTHER RESP ORIENTATION from 04/15/2017 in [place]  Referring Provider  Dr. Ashok Cordia       Number of Visits: 4  Reason for Discharge:  Early Exit:  Personal  Smoking History:  History  Smoking Status  . Passive Smoke Exposure - Never Smoker  Smokeless Tobacco  . Never Used    Comment: Husband smoked    Diagnosis:  Pulmonary hypertension ([place])  ADL [facility]:     Pulmonary Assessment Scores    Row Name 04/15/17 1522         ADL [facility]   ADL Phase Entry     SOB Score total 47     Rest 0     Walk 2     Stairs 3     Bath 3     Dress 3     Shop 3       CAT Score   CAT Score 3       mMRC Score   mMRC Score 12        Initial Exercise Prescription:     Initial Exercise Prescription - 04/15/17 1400      Date of Initial Exercise RX and Referring Provider   Date 04/15/17   Referring Provider Dr. Ashok Cordia     NuStep   Level 2   SPM 6   Minutes 30   METs 1.6     Prescription Details   Frequency (times per week) 2   Duration Progress to 30 minutes of continuous aerobic without signs/symptoms of physical distress     Intensity   THRR 40-80% of Max Heartrate [phone]   Ratings of Perceived Exertion 11-13   Perceived Dyspnea 0-4     Progression   Progression Continue progressive overload as per policy without signs/symptoms or physical distress.     Resistance Training   Training Prescription Yes   Weight 1   Reps 10-15      Discharge Exercise Prescription (Final Exercise Prescription Changes):     Exercise Prescription Changes - 05/10/17 1400      Response to Exercise   Blood Pressure (Admit) 126/74   Blood Pressure (Exercise) 142/68   Blood Pressure (Exit) 128/70   Heart Rate (Admit) 81 bpm   Heart Rate (Exercise) 96 bpm   Heart Rate (Exit) 80 bpm   Oxygen Saturation (Admit) 93 %   Oxygen Saturation (Exercise) 90 %   Oxygen Saturation (Exit) 92 %   Rating of Perceived Exertion (Exercise) 14   Perceived Dyspnea (Exercise) 13   Duration Progress to 30 minutes of  aerobic without signs/symptoms of physical distress   Intensity THRR unchanged     Progression   Progression Continue to progress workloads to maintain intensity without signs/symptoms of physical distress.     Resistance Training   Training Prescription Yes   Weight 1   Reps 10-15     NuStep   Level 1   SPM 10   Minutes 30   METs 3.29     Home Exercise Plan   Plans to continue exercise at Home (comment)   Frequency Add 2 additional days to program exercise sessions.      Functional Capacity:     6 Minute Walk    Row Name 04/15/17 1429         6 Minute Walk   Phase Initial  Distance 950 feet     Distance % Change 0 %     Walk Time 6 minutes     # of Rest Breaks 0     MPH 1.79     METS 2.37     RPE 11     Perceived Dyspnea  9     VO2 Peak 6.31     Symptoms No     Resting HR 72 bpm     Resting BP 190/70     Max Ex. HR 102 bpm     Max Ex. BP 164/68     2 Minute Post BP 170/68        Psychological, QOL, Others - Outcomes: PHQ 2/9: Depression screen [facility] 2/9 04/15/2017 09/22/2016  Decreased Interest 1 0  Down, Depressed, Hopeless 1 0  PHQ - 2 Score 2 0  Altered sleeping 3 -  Tired, decreased energy 3 -  Change in appetite 0 -  Feeling bad or failure about yourself  0 -  Trouble concentrating 1 -  Moving slowly or fidgety/restless 1 -  Suicidal thoughts 0 -  PHQ-9 Score 10 -  Difficult doing work/chores Somewhat difficult -    Quality of Life:     Quality of Life - 04/15/17 1511      Quality of Life Scores   Health/Function Pre 18 %   Socioeconomic Pre 30 %   Psych/Spiritual Pre 26.57 %   Family Pre 30 %   GLOBAL Pre 23.25 %      Personal Goals: Goals established at orientation with interventions provided to work toward goal.     Personal Goals and Risk  Factors at Admission - 04/15/17 1525      Core Components/Risk Factors/Patient Goals on Admission    Weight Management Weight Maintenance   Improve shortness of breath with ADL's Yes   Intervention Provide education, individualized exercise plan and daily activity instruction to help decrease symptoms of SOB with activities of daily living.   Expected Outcomes Short Term: Achieves a reduction of symptoms when performing activities of daily living.   Develop more efficient breathing techniques such as purse lipped breathing and diaphragmatic breathing; and practicing self-pacing with activity Yes   Intervention Provide education, demonstration and support about specific breathing techniuqes utilized for more efficient breathing. Include techniques such as pursed lipped breathing, diaphragmatic breathing and self-pacing activity.   Expected Outcomes Short Term: Participant will be able to demonstrate and use breathing techniques as needed throughout daily activities.   Hypertension Yes   Intervention Provide education on lifestyle modifcations including regular physical activity/exercise, weight management, moderate sodium restriction and increased consumption of fresh fruit, vegetables, and low fat dairy, alcohol moderation, and smoking cessation.;Monitor prescription use compliance.   Expected Outcomes Short Term: Continued assessment and intervention until BP is < 140/54mm HG in hypertensive participants. < 130/21mm HG in hypertensive participants with diabetes, heart failure or chronic kidney disease.;Long Term: Maintenance of blood pressure at goal levels.   Stress Yes   Intervention Offer individual and/or small group education and counseling on adjustment to heart disease, stress management and health-related lifestyle change. Teach and support self-help strategies.;Refer participants experiencing significant psychosocial distress to appropriate mental health specialists for further evaluation and  treatment. When possible, include family members and significant others in education/counseling sessions.   Expected Outcomes Short Term: Participant demonstrates changes in health-related behavior, relaxation and other stress management skills, ability to obtain effective social support, and compliance with psychotropic medications if prescribed.;Long Term:  Emotional wellbeing is indicated by absence of clinically significant psychosocial distress or social isolation.   Personal Goal Other Yes   Personal Goal Have more energy; return to normal activities at church and be able to volunteer at soup kitchen.    Intervention Patient will attend PR exercising 2 days/wk and suppment with exercise at home 3 days/wk.   Expected Outcomes Patient will complete the program meeting her personal goals.        Personal Goals Discharge:   Nutrition & Weight - Outcomes:     Pre Biometrics - 04/15/17 1431      Pre Biometrics   Height 5\' 2"  (1.575 m)   Waist Circumference 29 inches   Hip Circumference 33.5 inches   Waist to Hip Ratio 0.87 %   Triceps Skinfold 13 mm   % Body Fat 32.1 %   Grip Strength --  Machine Broken   Flexibility 0 in   Single Leg Stand 2 seconds       Nutrition:   Nutrition Discharge:     Nutrition Assessments - 04/15/17 1525      Rate Your Plate Scores   Pre Score 55   Pre Score % 55 %      Education Questionnaire Score:     Knowledge Questionnaire Score - 04/15/17 1521      Knowledge Questionnaire Score   Pre Score 9/14

## 2017-05-27 NOTE — Addendum Note (Signed)
Encounter addended by: Dwana Melena, RN on: 05/27/2017  3:42 PM<BR>    Actions taken: Episode resolved

## 2017-05-27 NOTE — Progress Notes (Signed)
Result reviewed. 

## 2017-05-27 NOTE — Patient Instructions (Signed)
   Call pulmonary rehab and let them know you are wanting to wait on returning to rehab.  I will see you back in 6 months or sooner if needed.  Call me if you have any new breathing problems or questions before your next appointment.

## 2017-05-27 NOTE — Progress Notes (Signed)
Pulmonary Individual Treatment Plan  Patient Details  Name: Charlotte [name] MRN: [id number] Date of Birth: [birth year] Referring Provider:     PULMONARY REHAB OTHER RESP ORIENTATION from 04/15/2017 in [place]  Referring Provider  Dr. Ashok Cordia      Initial Encounter Date:    [place] from 04/15/2017 in [place]  Date  04/15/17  Referring Provider  Dr. Ashok Cordia      Visit Diagnosis: Pulmonary hypertension ([place])  Patient's Home Medications on Admission:   Current Outpatient Prescriptions:  .  anastrozole (ARIMIDEX) 1 MG tablet, Take 1 tablet (1 mg total) by mouth daily. (Patient taking differently: Take 0.5 mg by mouth daily. ), Disp: 90 tablet, Rfl: 3 .  aspirin EC 81 MG tablet, Take 81 mg by mouth daily., Disp: , Rfl:  .  Calcium Carbonate-Vitamin D (CALCIUM 600+D3 PO), Take 1 tablet by mouth 2 (two) times daily., Disp: , Rfl:  .  carbamide peroxide (DEBROX) 6.5 % otic solution, Place 5 drops into both ears daily as needed (ear wax buildup). , Disp: , Rfl:  .  cycloSPORINE (RESTASIS) 0.05 % ophthalmic emulsion, Place 1 drop into both eyes 2 (two) times daily., Disp: , Rfl:  .  felodipine (PLENDIL) 5 MG 24 hr tablet, Take 5 mg by mouth daily. Takes in the afternoon around 1500, Disp: , Rfl:  .  fluticasone (FLONASE) 50 MCG/ACT nasal spray, Place 2 sprays into the nose daily as needed for allergies., Disp: , Rfl:  .  furosemide (LASIX) 20 MG tablet, Take 1 tablet (20 mg total) by mouth daily as needed. (Patient taking differently: Take 20 mg by mouth daily as needed for fluid or edema. ), Disp: 90 tablet, Rfl: 3 .  hydrALAZINE (APRESOLINE) 50 MG tablet, Take 1 tablet (50 mg total) by mouth 2 (two) times daily. (Patient taking differently: Take 50 mg by mouth 2 (two) times daily. ), Disp: 180 tablet, Rfl: 3 .  Liniments (SALONPAS PAIN RELIEF PATCH) PADS, Apply 1 each topically daily as needed (pain)., Disp: , Rfl:  .   LORazepam (ATIVAN) 1 MG tablet, Take 0.5 mg by mouth every 8 (eight) hours as needed for anxiety or sleep. , Disp: , Rfl:  .  losartan (COZAAR) 100 MG tablet, Take 100 mg by mouth daily., Disp: , Rfl:  .  Multiple Vitamins-Minerals (CENTRUM ADULTS PO), Take 1 tablet by mouth daily., Disp: , Rfl:  .  non-metallic deodorant (ALRA) MISC, Apply 1 application topically daily. , Disp: , Rfl:  .  Omega-3 Fatty Acids (FISH OIL TRIPLE STRENGTH) 1400 MG CAPS, Take 1 tablet by mouth daily., Disp: , Rfl:  .  Polyethyl Glycol-Propyl Glycol (SYSTANE) 0.4-0.3 % SOLN, Apply 1 drop to eye 2 (two) times daily., Disp: , Rfl:  .  potassium chloride SA (K-DUR,KLOR-CON) 20 MEQ tablet, Take 1 tablet (20 mEq total) by mouth daily. (Patient not taking: Reported on 05/27/2017), Disp: 3 tablet, Rfl: 0 .  sertraline (ZOLOFT) 25 MG tablet, Take 1 tablet (25 mg total) by mouth daily., Disp: 30 tablet, Rfl: 5 .  tretinoin (RETIN-A) 0.025 % cream, Apply 1 application topically at bedtime. , Disp: , Rfl: 3  Past Medical History: Past Medical History:  Diagnosis Date  . Anxiety   . Arthritis   . Cancer [facility])    breast cancer  . Fatigue   . History of hyperthyroidism    resolved, no medicaions for treatment at this time  . Hypertension   .  Insomnia   . OSA (obstructive sleep apnea)     Tobacco Use: History  Smoking Status  . Passive Smoke Exposure - Never Smoker  Smokeless Tobacco  . Never Used    Comment: Husband smoked    Labs: Recent Review Flowsheet Data    Labs for ITP Cardiac and Pulmonary Rehab Latest Ref Rng & Units 11/04/2015   PHART 7.350 - 7.450 7.649(HH)   PCO2ART 35.0 - 45.0 mmHg 22.8(L)   HCO3 20.0 - 24.0 mEq/L 29.5(H)   O2SAT % 99.0      Capillary Blood Glucose: Lab Results  Component Value Date   GLUCAP 105 (H) 12/10/2016   GLUCAP 112 (H) 12/09/2016     ADL UCSD:     Pulmonary Assessment Scores    Row Name 04/15/17 1522         ADL UCSD   ADL Phase Entry     SOB Score total  47     Rest 0     Walk 2     Stairs 3     Bath 3     Dress 3     Shop 3       CAT Score   CAT Score 3       mMRC Score   mMRC Score 12        Pulmonary Function Assessment:   Exercise Target Goals:    Exercise Program Goal: Individual exercise prescription set with THRR, safety & activity barriers. Participant demonstrates ability to understand and report RPE using BORG scale, to self-measure pulse accurately, and to acknowledge the importance of the exercise prescription.  Exercise Prescription Goal: Starting with aerobic activity 30 plus minutes a day, 3 days per week for initial exercise prescription. Provide home exercise prescription and guidelines that participant acknowledges understanding prior to discharge.  Activity Barriers & Risk Stratification:     Activity Barriers & Cardiac Risk Stratification - 04/15/17 1524      Activity Barriers & Cardiac Risk Stratification   Activity Barriers Back Problems;Shortness of Breath   Cardiac Risk Stratification High      6 Minute Walk:     6 Minute Walk    Row Name 04/15/17 1429         6 Minute Walk   Phase Initial     Distance 950 feet     Distance % Change 0 %     Walk Time 6 minutes     # of Rest Breaks 0     MPH 1.79     METS 2.37     RPE 11     Perceived Dyspnea  9     VO2 Peak 6.31     Symptoms No     Resting HR 72 bpm     Resting BP 190/70     Max Ex. HR 102 bpm     Max Ex. BP 164/68     2 Minute Post BP 170/68        Oxygen Initial Assessment:     Oxygen Initial Assessment - 04/15/17 1524      Home Oxygen   Home Oxygen Device None   Sleep Oxygen Prescription None   Home Exercise Oxygen Prescription None   Home at Rest Exercise Oxygen Prescription None     Initial 6 min Walk   Oxygen Used None     Program Oxygen Prescription   Program Oxygen Prescription None      Oxygen Re-Evaluation:   Oxygen Discharge (Final Oxygen  Re-Evaluation):   Initial Exercise Prescription:      Initial Exercise Prescription - 04/15/17 1400      Date of Initial Exercise RX and Referring Provider   Date 04/15/17   Referring Provider Dr. Ashok Cordia     NuStep   Level 2   SPM 6   Minutes 30   METs 1.6     Prescription Details   Frequency (times per week) 2   Duration Progress to 30 minutes of continuous aerobic without signs/symptoms of physical distress     Intensity   THRR 40-80% of Max Heartrate [phone]   Ratings of Perceived Exertion 11-13   Perceived Dyspnea 0-4     Progression   Progression Continue progressive overload as per policy without signs/symptoms or physical distress.     Resistance Training   Training Prescription Yes   Weight 1   Reps 10-15      Perform Capillary Blood Glucose checks as needed.  Exercise Prescription Changes:      Exercise Prescription Changes    Row Name 05/10/17 1400             Response to Exercise   Blood Pressure (Admit) 126/74       Blood Pressure (Exercise) 142/68       Blood Pressure (Exit) 128/70       Heart Rate (Admit) 81 bpm       Heart Rate (Exercise) 96 bpm       Heart Rate (Exit) 80 bpm       Oxygen Saturation (Admit) 93 %       Oxygen Saturation (Exercise) 90 %       Oxygen Saturation (Exit) 92 %       Rating of Perceived Exertion (Exercise) 14       Perceived Dyspnea (Exercise) 13       Duration Progress to 30 minutes of  aerobic without signs/symptoms of physical distress       Intensity THRR unchanged         Progression   Progression Continue to progress workloads to maintain intensity without signs/symptoms of physical distress.         Resistance Training   Training Prescription Yes       Weight 1       Reps 10-15         NuStep   Level 1       SPM 10       Minutes 30       METs 3.29         Home Exercise Plan   Plans to continue exercise at Home (comment)       Frequency Add 2 additional days to program exercise sessions.          Exercise Comments:      Exercise  Comments    Row Name 05/10/17 1445 05/25/17 0755         Exercise Comments Patient has not increased in PR due to lack of attendance due to health and personal issues.  Patient has not returned since last progression period.          Exercise Goals and Review:      Exercise Goals    Row Name 04/15/17 1525             Exercise Goals   Increase Physical Activity Yes       Intervention Provide advice, education, support and counseling about physical activity/exercise needs.;Develop an individualized exercise prescription for  aerobic and resistive training based on initial evaluation findings, risk stratification, comorbidities and participant's personal goals.       Expected Outcomes Achievement of increased cardiorespiratory fitness and enhanced flexibility, muscular endurance and strength shown through measurements of functional capacity and personal statement of participant.       Increase Strength and Stamina Yes       Intervention Provide advice, education, support and counseling about physical activity/exercise needs.;Develop an individualized exercise prescription for aerobic and resistive training based on initial evaluation findings, risk stratification, comorbidities and participant's personal goals.       Expected Outcomes Achievement of increased cardiorespiratory fitness and enhanced flexibility, muscular endurance and strength shown through measurements of functional capacity and personal statement of participant.          Exercise Goals Re-Evaluation :   Discharge Exercise Prescription (Final Exercise Prescription Changes):     Exercise Prescription Changes - 05/10/17 1400      Response to Exercise   Blood Pressure (Admit) 126/74   Blood Pressure (Exercise) 142/68   Blood Pressure (Exit) 128/70   Heart Rate (Admit) 81 bpm   Heart Rate (Exercise) 96 bpm   Heart Rate (Exit) 80 bpm   Oxygen Saturation (Admit) 93 %   Oxygen Saturation (Exercise) 90 %   Oxygen  Saturation (Exit) 92 %   Rating of Perceived Exertion (Exercise) 14   Perceived Dyspnea (Exercise) 13   Duration Progress to 30 minutes of  aerobic without signs/symptoms of physical distress   Intensity THRR unchanged     Progression   Progression Continue to progress workloads to maintain intensity without signs/symptoms of physical distress.     Resistance Training   Training Prescription Yes   Weight 1   Reps 10-15     NuStep   Level 1   SPM 10   Minutes 30   METs 3.29     Home Exercise Plan   Plans to continue exercise at Home (comment)   Frequency Add 2 additional days to program exercise sessions.      Nutrition:  Target Goals: Understanding of nutrition guidelines, daily intake of sodium 1500mg , cholesterol 200mg , calories 30% from fat and 7% or less from saturated fats, daily to have 5 or more servings of fruits and vegetables.  Biometrics:     Pre Biometrics - 04/15/17 1431      Pre Biometrics   Height 5\' 2"  (1.575 m)   Waist Circumference 29 inches   Hip Circumference 33.5 inches   Waist to Hip Ratio 0.87 %   Triceps Skinfold 13 mm   % Body Fat 32.1 %   Grip Strength --  Machine Broken   Flexibility 0 in   Single Leg Stand 2 seconds       Nutrition Therapy Plan and Nutrition Goals:   Nutrition Discharge: Rate Your Plate Scores:     Nutrition Assessments - 04/15/17 1525      Rate Your Plate Scores   Pre Score 55   Pre Score % 55 %      Nutrition Goals Re-Evaluation:   Nutrition Goals Discharge (Final Nutrition Goals Re-Evaluation):   Psychosocial: Target Goals: Acknowledge presence or absence of significant depression and/or stress, maximize coping skills, provide positive support system. Participant is able to verbalize types and ability to use techniques and skills needed for reducing stress and depression.  Initial Review & Psychosocial Screening:     Initial Psych Review & Screening - 04/15/17 1527      Initial  Review    Current issues with Current Depression;Current Anxiety/Panic     Family Dynamics   Good Support System? Yes   Comments Patient has depression and anxiety with panic attacks. She is being treated with Zoloft and Ativan.     Barriers   Psychosocial barriers to participate in program The patient should benefit from training in stress management and relaxation.;Psychosocial barriers identified (see note)     Screening Interventions   Interventions Encouraged to exercise      Quality of Life Scores:     Quality of Life - 04/15/17 1511      Quality of Life Scores   Health/Function Pre 18 %   Socioeconomic Pre 30 %   Psych/Spiritual Pre 26.57 %   Family Pre 30 %   GLOBAL Pre 23.25 %      PHQ-9: Recent Review Flowsheet Data    Depression screen [facility] 2/9 04/15/2017 09/22/2016   Decreased Interest 1 0   Down, Depressed, Hopeless 1 0   PHQ - 2 Score 2 0   Altered sleeping 3 -   Tired, decreased energy 3 -   Change in appetite 0 -   Feeling bad or failure about yourself  0 -   Trouble concentrating 1 -   Moving slowly or fidgety/restless 1 -   Suicidal thoughts 0 -   PHQ-9 Score 10 -   Difficult doing work/chores Somewhat difficult -     Interpretation of Total Score  Total Score Depression Severity:  1-4 = Minimal depression, 5-9 = Mild depression, 10-14 = Moderate depression, 15-19 = Moderately severe depression, 20-27 = Severe depression   Psychosocial Evaluation and Intervention:     Psychosocial Evaluation - 04/15/17 1528      Psychosocial Evaluation & Interventions   Interventions Relaxation education;Encouraged to exercise with the program and follow exercise prescription;Stress management education   Comments Patient's PHQ-9 score was 10 and his QOL score    Continue Psychosocial Services  Follow up required by staff      Psychosocial Re-Evaluation:   Psychosocial Discharge (Final Psychosocial Re-Evaluation):    Education: Education Goals: Education  classes will be provided on a weekly basis, covering required topics. Participant will state understanding/return demonstration of topics presented.  Learning Barriers/Preferences:     Learning Barriers/Preferences - 04/15/17 1521      Learning Barriers/Preferences   Learning Barriers None   Learning Preferences Written Material;Skilled Demonstration      Education Topics: How Lungs Work and Diseases: - Discuss the anatomy of the lungs and diseases that can affect the lungs, such as COPD.   PULMONARY REHAB OTHER RESPIRATORY from 04/29/2017 in [place]  Date  04/22/17  Educator  Lisabeth Register  Instruction Review Code  2- meets goals/outcomes      Exercise: -Discuss the importance of exercise, FITT principles of exercise, normal and abnormal responses to exercise, and how to exercise safely.   PULMONARY REHAB OTHER RESPIRATORY from 04/29/2017 in [place]  Date  04/29/17  Educator  Nils Flack  Instruction Review Code  2- meets goals/outcomes      Environmental Irritants: -Discuss types of environmental irritants and how to limit exposure to environmental irritants.   Meds/Inhalers and oxygen: - Discuss respiratory medications, definition of an inhaler and oxygen, and the proper way to use an inhaler and oxygen.   Energy Saving Techniques: - Discuss methods to conserve energy and decrease shortness of breath when performing activities of daily living.  Bronchial Hygiene / Breathing Techniques: - Discuss breathing mechanics, pursed-lip breathing technique,  proper posture, effective ways to clear airways, and other functional breathing techniques   Cleaning Equipment: - Provides group verbal and written instruction about the health risks of elevated stress, cause of high stress, and healthy ways to reduce stress.   Nutrition I: Fats: - Discuss the types of cholesterol, what cholesterol does to the body, and how  cholesterol levels can be controlled.   Nutrition II: Labels: -Discuss the different components of food labels and how to read food labels.   Respiratory Infections: - Discuss the signs and symptoms of respiratory infections, ways to prevent respiratory infections, and the importance of seeking medical treatment when having a respiratory infection.   Stress I: Signs and Symptoms: - Discuss the causes of stress, how stress may lead to anxiety and depression, and ways to limit stress.   Stress II: Relaxation: -Discuss relaxation techniques to limit stress.   Oxygen for Home/Travel: - Discuss how to prepare for travel when on oxygen and proper ways to transport and store oxygen to ensure safety.   Knowledge Questionnaire Score:     Knowledge Questionnaire Score - 04/15/17 1521      Knowledge Questionnaire Score   Pre Score 9/14      Core Components/Risk Factors/Patient Goals at Admission:     Personal Goals and Risk Factors at Admission - 04/15/17 1525      Core Components/Risk Factors/Patient Goals on Admission    Weight Management Weight Maintenance   Improve shortness of breath with ADL's Yes   Intervention Provide education, individualized exercise plan and daily activity instruction to help decrease symptoms of SOB with activities of daily living.   Expected Outcomes Short Term: Achieves a reduction of symptoms when performing activities of daily living.   Develop more efficient breathing techniques such as purse lipped breathing and diaphragmatic breathing; and practicing self-pacing with activity Yes   Intervention Provide education, demonstration and support about specific breathing techniuqes utilized for more efficient breathing. Include techniques such as pursed lipped breathing, diaphragmatic breathing and self-pacing activity.   Expected Outcomes Short Term: Participant will be able to demonstrate and use breathing techniques as needed throughout daily  activities.   Hypertension Yes   Intervention Provide education on lifestyle modifcations including regular physical activity/exercise, weight management, moderate sodium restriction and increased consumption of fresh fruit, vegetables, and low fat dairy, alcohol moderation, and smoking cessation.;Monitor prescription use compliance.   Expected Outcomes Short Term: Continued assessment and intervention until BP is < 140/51mm HG in hypertensive participants. < 130/60mm HG in hypertensive participants with diabetes, heart failure or chronic kidney disease.;Long Term: Maintenance of blood pressure at goal levels.   Stress Yes   Intervention Offer individual and/or small group education and counseling on adjustment to heart disease, stress management and health-related lifestyle change. Teach and support self-help strategies.;Refer participants experiencing significant psychosocial distress to appropriate mental health specialists for further evaluation and treatment. When possible, include family members and significant others in education/counseling sessions.   Expected Outcomes Short Term: Participant demonstrates changes in health-related behavior, relaxation and other stress management skills, ability to obtain effective social support, and compliance with psychotropic medications if prescribed.;Long Term: Emotional wellbeing is indicated by absence of clinically significant psychosocial distress or social isolation.   Personal Goal Other Yes   Personal Goal Have more energy; return to normal activities at church and be able to volunteer at soup kitchen.    Intervention Patient will attend PR  exercising 2 days/wk and suppment with exercise at home 3 days/wk.   Expected Outcomes Patient will complete the program meeting her personal goals.       Core Components/Risk Factors/Patient Goals Review:    Core Components/Risk Factors/Patient Goals at Discharge (Final Review):    ITP Comments:     ITP  Comments    Row Name 04/15/17 1553 05/13/17 1433 05/27/17 1523       ITP Comments Patient new to program. Plans to start Tuesday 04/20/17. Patient new to program completing 4 sessions. She has missed the last 2 weeks. She had a port-a-cath removed last week. Will continue to monitor for progress.  Patient stopped attending after completing 4 sessions. She said she was not up to doing the program at this time.         Comments: Patient stopped coming to Pulmonary Rehab on 04/29/17 after completing 4 sessions stating she did not feel up to doing the program at this time. Doctor will be informed.

## 2017-05-27 NOTE — Progress Notes (Signed)
Subjective:    Patient ID: Charlotte [name], female    DOB: June 27, 1928, 83 y.o.   MRN: [id number]  C.C.:  Follow-up for Pulmonary Hypertension, OSA, and Left Upper Lobe Opacity.  HPI  Pulmonary hypertension: As per echocardiogram and likely multifactorial. Weight previously stable at 127-128 pounds. She was referred to pulmonary rehab at last appointment. She reports she stopped going to rehab feeling like it was too much activity for her after her port was removed. She reports normal breathing that seems to be improving except during her panic attacks. No coughing or wheezing. She denies any new leg swelling.   OSA: Currently on CPAP therapy. Managed by neurology. She reports she is using her CPAP but does have trouble falling back asleep when she gets up to use the bathroom.   Left upper lobe opacity: Likely due to previous breast cancer and XRT. Followed by medical oncology. Defer further follow-up imaging to medical oncology.  Review of Systems She reports she is having more anxiety and panic attacks. She denies any chest pain or pressure. No fever or chills.   Allergies  Allergen Reactions  . Latex Swelling    SEVERITY OF SWELLING NOT DEFINED.  Marland Kitchen Bextra [Valdecoxib] Other (See Comments)    Stomach Pains  . Motrin [Ibuprofen] Other (See Comments)    Stomach pain    Current Outpatient Prescriptions on File Prior to Visit  Medication Sig Dispense Refill  . anastrozole (ARIMIDEX) 1 MG tablet Take 1 tablet (1 mg total) by mouth daily. (Patient taking differently: Take 0.5 mg by mouth daily. ) 90 tablet 3  . aspirin EC 81 MG tablet Take 81 mg by mouth daily.    . Calcium Carbonate-Vitamin D (CALCIUM 600+D3 PO) Take 1 tablet by mouth 2 (two) times daily.    . carbamide peroxide (DEBROX) 6.5 % otic solution Place 5 drops into both ears daily as needed (ear wax buildup).     . cycloSPORINE (RESTASIS) 0.05 % ophthalmic emulsion Place 1 drop into both eyes 2 (two) times daily.    .  felodipine (PLENDIL) 5 MG 24 hr tablet Take 5 mg by mouth daily. Takes in the afternoon around 1500    . fluticasone (FLONASE) 50 MCG/ACT nasal spray Place 2 sprays into the nose daily as needed for allergies.    . hydrALAZINE (APRESOLINE) 50 MG tablet Take 1 tablet (50 mg total) by mouth 2 (two) times daily. (Patient taking differently: Take 50 mg by mouth 2 (two) times daily. ) 180 tablet 3  . Liniments ([place] PAIN RELIEF PATCH) PADS Apply 1 each topically daily as needed (pain).    . LORazepam (ATIVAN) 1 MG tablet Take 0.5 mg by mouth every 8 (eight) hours as needed for anxiety or sleep.     Marland Kitchen losartan (COZAAR) 100 MG tablet Take 100 mg by mouth daily.    . non-metallic deodorant Jethro Poling) MISC Apply 1 application topically daily.     . Omega-3 Fatty Acids (FISH OIL TRIPLE STRENGTH) 1400 MG CAPS Take 1 tablet by mouth daily.    Vladimir Faster Glycol-Propyl Glycol (SYSTANE) 0.4-0.3 % SOLN Apply 1 drop to eye 2 (two) times daily.    . sertraline (ZOLOFT) 25 MG tablet Take 1 tablet (25 mg total) by mouth daily. 30 tablet 5  . tretinoin (RETIN-A) 0.025 % cream Apply 1 application topically at bedtime.   3  . furosemide (LASIX) 20 MG tablet Take 1 tablet (20 mg total) by mouth daily as needed. (Patient taking  differently: Take 20 mg by mouth daily as needed for fluid or edema. ) 90 tablet 3  . Multiple Vitamins-Minerals (CENTRUM ADULTS PO) Take 1 tablet by mouth daily.    . potassium chloride SA (K-DUR,KLOR-CON) 20 MEQ tablet Take 1 tablet (20 mEq total) by mouth daily. (Patient not taking: Reported on 05/27/2017) 3 tablet 0   No current facility-administered medications on file prior to visit.     Past Medical History:  Diagnosis Date  . Anxiety   . Arthritis   . Cancer [facility])    breast cancer  . Fatigue   . History of hyperthyroidism    resolved, no medicaions for treatment at this time  . Hypertension   . Insomnia   . OSA (obstructive sleep apnea)     Past Surgical History:  Procedure  Laterality Date  . BREAST LUMPECTOMY     right side  . BREAST LUMPECTOMY WITH AXILLARY LYMPH NODE BIOPSY Left 06/17/2016   Procedure: BREAST LUMPECTOMY WITH AXILLARY LYMPH NODE BIOPSY;  Surgeon: Autumn Messing III, MD;  Location: [place];  Service: General;  Laterality: Left;  . COLONOSCOPY    . DILATION AND CURETTAGE OF UTERUS    . EYE SURGERY     bilateral cataracts removed  . FOOT SURGERY    . PORTACATH PLACEMENT Right 07/16/2016   Procedure: INSERTION PORT-A-CATH RIGHT SUBLCLAVIAN;  Surgeon: Autumn Messing III, MD;  Location: [place];  Service: General;  Laterality: Right;  . TONSILLECTOMY      Family History  Problem Relation Age of Onset  . Heart attack Mother   . Cancer Mother   . Hypertension Other   . Lung disease Neg Hx   . Rheumatologic disease Neg Hx     Social History   Social History  . Marital status: Married    Spouse name: N/A  . Number of children: N/A  . Years of education: N/A   Social History Main Topics  . Smoking status: Passive Smoke Exposure - Never Smoker  . Smokeless tobacco: Never Used     Comment: Husband smoked  . Alcohol use No  . Drug use: No  . Sexual activity: Not Currently   Other Topics Concern  . 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 Originally from [place]. Lived for years in [place]. Moved back to [place] in 1991. Has also lived in [place]. No pets currently. No bird or mold exposure.      Objective:   Physical Exam BP (!) 180/80 (BP Location: Right Arm, Patient Position: Sitting, Cuff Size: Normal)   Pulse 67   Ht _0  (1.575 m)   Wt 121 lb 3.2 oz (55 kg)   SpO2 98%   BMI 22.17 kg/m   General:  Elderly female. Awake. Alert. No distress. Daughter with patient again today.  Integument:  Warm & dry. No rash on exposed skin.  Extremities:  No cyanosis or clubbing.  HEENT:  Minimal nasal turbinate  swelling. No oral ulcers. Moist mucous membranes.  Cardiovascular:  Regular rate. No edema. Unable to appreciate JVD. Pulmonary:  Clear to auscultation. Good aeration bilaterally. No accessory muscle use.  Abdomen: Soft. Normal bowel sounds. Nondistended.  Musculoskeletal:  Normal bulk and tone. No joint deformity or effusion appreciated.  6MWT 05/27/17:  Walked 324 meters / Baseline Sat 99% on RA / Nadir Sat 96% on RA  CPAP COMPLIANCE  DOWNLOAD 02/03 - 02/21/17:  97% usage >/= 4 hours. Minimum pressure 6cm H2O & maximum 12 cm H2O. Residual AHI 0.2 events/hr.   POLYSOMNOGRAM (11/25/16): Total AHI 16.1 events/hour with non-REM AHI 19.4 events/hour. Baseline saturation 96% with low saturation 75%. Patient spent 77 minutes with saturation less than 89%. Patient hasn't a total of 0 periodic limb movements. Patient had isolated PVCs on EKG but otherwise normal sinus rhythm. Interestingly patient had neither REM sleep nor supine positional sleep recorded.  IMAGING CXR PA/LAT 02/15/17 (previously reviewed by me): Left upper lung opacity slightly less dense compared with imaging from December. Resolved silhouetting of the left hemidiaphragm. No pleural effusion appreciated. Heart normal in size & mediastinum normal in contour.  CT CHEST 01/04/17 (previously reviewed by me): Persistent opacification with air bronchograms suggesting consolidation within the left upper lobe. Evidence of progression compared with prior CT imaging from December 2017. Trace left pleural effusion versus pleural thickening. Unable to appreciate any pathologic mediastinal adenopathy.  CARDIAC TTE (11/17/16): LV normal in size with moderate LVH. EF 50%. Grade 2 diastolic dysfunction. LA moderately dilated & RA normal in size. RV normal in size and function. Pulmonary artery systolic pressure 64 mmHg. Trivial aortic regurgitation. Aortic root normal in size. Mild mitral regurgitation. No patent foreman ovale identified. Trivial  pulmonic regurgitation. Mild tricuspid regurgitation. No pericardial effusion. Previous ejection fraction 60-65% in July 2017 with grade 1 diastolic dysfunction, preserved right ventricular size/function, & pulmonary artery systolic pressure 51 mmHg.  LABS 11/04/15 ABG on RA:  7.649 / 22.8 / 117 / 99%  12/12/16 CRP:  <0.8 ESR:  20 ANA:  Positive (Speckled 1:160 & homogeneous 1:320) Anti-CCP:  14 RF:  <10 SCL-70:  <0.2    Assessment & Plan:  83 y.o.  female with pulmonary hypertension and obstructive sleep apnea. No evidence of oxygen requirement on her 6 minute walk test today. This is encouraging as is her continued euvolemic status. I see no need to initiate pulmonary arterial vasodilator therapy nor do I see a need to consider right heart catheterization at this time. We did discuss the fact that her pulmonary hypertension is likely due to her sleep apnea in conjunction with her left ventricular diastolic congestive heart failure. I instructed the patient contact my office if she had any new breathing problems or questions before her next appointment.  1. Pulmonary hypertension:  Continuing treatment of OSA with CPAP and optimization of blood pressure. Continuing Lasix for intermittent edema. 2. OSA: Continuing CPAP therapy. Managed by neurology.  3. Left upper lobe opacity: Defer further follow-up imaging to medical oncology. 4. Health maintenance: Status post influenza vaccine September 2017. patient reports she did have Pneumovax after the age of 70.  61. Follow-up: Return to clinic in  6 months or sooner if needed.   Sonia Baller Ashok Cordia, M.D. [facility] Pager:  [phone] After 3pm or if no response, call [phone] 12:16 PM 05/27/17

## 2017-06-01 ENCOUNTER — Encounter (HOSPITAL_COMMUNITY): Payer: Medicare Other

## 2017-06-03 ENCOUNTER — Encounter (HOSPITAL_COMMUNITY): Payer: Medicare Other

## 2017-06-08 ENCOUNTER — Encounter (HOSPITAL_COMMUNITY): Payer: Medicare Other

## 2017-06-09 ENCOUNTER — Other Ambulatory Visit: Payer: Self-pay | Admitting: Hematology and Oncology

## 2017-06-09 DIAGNOSIS — Z853 Personal history of malignant neoplasm of breast: Secondary | ICD-10-CM

## 2017-06-10 ENCOUNTER — Encounter (HOSPITAL_COMMUNITY): Payer: Medicare Other

## 2017-06-11 DIAGNOSIS — F321 Major depressive disorder, single episode, moderate: Secondary | ICD-10-CM | POA: Diagnosis not present

## 2017-06-15 ENCOUNTER — Encounter (HOSPITAL_COMMUNITY): Payer: Medicare Other

## 2017-06-15 ENCOUNTER — Ambulatory Visit (HOSPITAL_COMMUNITY)
Admission: RE | Admit: 2017-06-15 | Discharge: 2017-06-15 | Disposition: A | Discharge status: Home / Self Care | Payer: Medicare Other | Source: Ambulatory Visit | Attending: Hematology and Oncology | Admitting: Hematology and Oncology

## 2017-06-15 DIAGNOSIS — Z853 Personal history of malignant neoplasm of breast: Secondary | ICD-10-CM | POA: Diagnosis not present

## 2017-06-15 DIAGNOSIS — C50212 Malignant neoplasm of upper-inner quadrant of left female breast: Secondary | ICD-10-CM

## 2017-06-15 DIAGNOSIS — R928 Other abnormal and inconclusive findings on diagnostic imaging of breast: Secondary | ICD-10-CM | POA: Diagnosis not present

## 2017-06-15 DIAGNOSIS — Z170 Estrogen receptor positive status [ER+]: Secondary | ICD-10-CM

## 2017-06-17 ENCOUNTER — Encounter (HOSPITAL_COMMUNITY): Payer: Medicare Other

## 2017-06-21 ENCOUNTER — Emergency Department (HOSPITAL_COMMUNITY): Payer: Medicare Other

## 2017-06-21 ENCOUNTER — Emergency Department (HOSPITAL_COMMUNITY)
Admission: EM | Admit: 2017-06-21 | Discharge: 2017-06-21 | Disposition: A | Discharge status: Home / Self Care | Payer: Medicare Other | Attending: Emergency Medicine | Admitting: Emergency Medicine

## 2017-06-21 ENCOUNTER — Encounter (HOSPITAL_COMMUNITY): Payer: Self-pay

## 2017-06-21 DIAGNOSIS — R0602 Shortness of breath: Secondary | ICD-10-CM | POA: Diagnosis not present

## 2017-06-21 DIAGNOSIS — Z79899 Other long term (current) drug therapy: Secondary | ICD-10-CM | POA: Insufficient documentation

## 2017-06-21 DIAGNOSIS — R531 Weakness: Secondary | ICD-10-CM | POA: Diagnosis not present

## 2017-06-21 DIAGNOSIS — Z853 Personal history of malignant neoplasm of breast: Secondary | ICD-10-CM | POA: Insufficient documentation

## 2017-06-21 DIAGNOSIS — Z7722 Contact with and (suspected) exposure to environmental tobacco smoke (acute) (chronic): Secondary | ICD-10-CM | POA: Diagnosis not present

## 2017-06-21 DIAGNOSIS — R404 Transient alteration of awareness: Secondary | ICD-10-CM | POA: Diagnosis not present

## 2017-06-21 DIAGNOSIS — Z7982 Long term (current) use of aspirin: Secondary | ICD-10-CM | POA: Diagnosis not present

## 2017-06-21 DIAGNOSIS — Z91040 Latex allergy status: Secondary | ICD-10-CM | POA: Diagnosis not present

## 2017-06-21 DIAGNOSIS — I272 Pulmonary hypertension, unspecified: Secondary | ICD-10-CM | POA: Diagnosis not present

## 2017-06-21 DIAGNOSIS — I10 Essential (primary) hypertension: Secondary | ICD-10-CM | POA: Diagnosis not present

## 2017-06-21 HISTORY — DX: Depression, unspecified: F32.A

## 2017-06-21 HISTORY — DX: Pulmonary hypertension, unspecified: I27.20

## 2017-06-21 HISTORY — DX: Major depressive disorder, single episode, unspecified: F32.9

## 2017-06-21 LAB — COMPREHENSIVE METABOLIC PANEL
ALBUMIN: 3.6 g/dL (ref 3.5–5.0)
ALT: 19 U/L (ref 14–54)
ANION GAP: 8 (ref 5–15)
AST: 21 U/L (ref 15–41)
Alkaline Phosphatase: 60 U/L (ref 38–126)
BILIRUBIN TOTAL: 0.4 mg/dL (ref 0.3–1.2)
BUN: 14 mg/dL (ref 6–20)
CO2: 30 mmol/L (ref 22–32)
Calcium: 9.4 mg/dL (ref 8.9–10.3)
Chloride: 94 mmol/L — ABNORMAL LOW (ref 101–111)
Creatinine, Ser: 0.78 mg/dL (ref 0.44–1.00)
GFR calc Af Amer: >60 mL/min (ref >60)
GFR calc non Af Amer: >60 mL/min (ref >60)
GLUCOSE: 95 mg/dL (ref 65–99)
POTASSIUM: 3.5 mmol/L (ref 3.5–5.1)
SODIUM: 132 mmol/L — AB (ref 135–145)
TOTAL PROTEIN: 7.2 g/dL (ref 6.5–8.1)

## 2017-06-21 LAB — CBC WITH DIFFERENTIAL/PLATELET
BASOS ABS: 0 10*3/uL (ref 0.0–0.1)
Basophils Relative: 0 %
Eosinophils Absolute: 0.1 10*3/uL (ref 0.0–0.7)
Eosinophils Relative: 1 %
HEMATOCRIT: 36.3 % (ref 36.0–46.0)
Hemoglobin: 12 g/dL (ref 12.0–15.0)
LYMPHS PCT: 28 %
Lymphs Abs: 1.4 10*3/uL (ref 0.7–4.0)
MCH: 27.3 pg (ref 26.0–34.0)
MCHC: 33.1 g/dL (ref 30.0–36.0)
MCV: 82.5 fL (ref 78.0–100.0)
MONO ABS: 0.4 10*3/uL (ref 0.1–1.0)
Monocytes Relative: 8 %
NEUTROS ABS: 3.3 10*3/uL (ref 1.7–7.7)
Neutrophils Relative %: 63 %
Platelets: 322 10*3/uL (ref 150–400)
RBC: 4.4 MIL/uL (ref 3.87–5.11)
RDW: 16.7 % — ABNORMAL HIGH (ref 11.5–15.5)
WBC: 5.2 10*3/uL (ref 4.0–10.5)

## 2017-06-21 LAB — URINALYSIS, ROUTINE W REFLEX MICROSCOPIC
Bilirubin Urine: NEGATIVE
Glucose, UA: NEGATIVE mg/dL
Hgb urine dipstick: NEGATIVE
Ketones, ur: NEGATIVE mg/dL
LEUKOCYTES UA: NEGATIVE
NITRITE: NEGATIVE
Protein, ur: NEGATIVE mg/dL
SPECIFIC GRAVITY, URINE: 1.002 — AB (ref 1.005–1.030)
pH: 8 (ref 5.0–8.0)

## 2017-06-21 LAB — BRAIN NATRIURETIC PEPTIDE: B Natriuretic Peptide: 58 pg/mL (ref 0.0–100.0)

## 2017-06-21 LAB — TROPONIN I: Troponin I: <0.03 ng/mL (ref <0.03)

## 2017-06-21 LAB — D-DIMER, QUANTITATIVE (NOT AT ARMC): D DIMER QUANT: 2.12 ug{FEU}/mL — AB (ref 0.00–0.50)

## 2017-06-21 MED ORDER — IOPAMIDOL (ISOVUE-300) INJECTION 61%: 100.0000 mL | Freq: Once | INTRAVENOUS | Status: DC | PRN

## 2017-06-21 MED ORDER — HYDRALAZINE HCL 20 MG/ML IJ SOLN
10.0000 mg | INTRAMUSCULAR | Status: AC
  Administered 2017-06-21: 10 mg via INTRAVENOUS
  Filled 2017-06-21: qty 1

## 2017-06-21 MED ORDER — HYDROGEN PEROXIDE 3 % EX SOLN
CUTANEOUS | Status: AC
  Filled 2017-06-21: qty 473

## 2017-06-21 MED ORDER — IOPAMIDOL (ISOVUE-370) INJECTION 76%
100.0000 mL | Freq: Once | INTRAVENOUS | Status: AC | PRN
  Administered 2017-06-21: 100 mL via INTRAVENOUS

## 2017-06-21 NOTE — ED Notes (Signed)
Pt transported to CT ?

## 2017-06-21 NOTE — Discharge Instructions (Signed)

## 2017-06-21 NOTE — ED Triage Notes (Addendum)
Pt reports that she woke up around 5 am and was ok, then she took her Zoloft and became SOB and felt like she couldn't catch her breath. No cough or CP. Sats upon EMS arrival 96. EMS did start O2 at that time Uses cpap at night. Pt states her zoloft was increased for 25 mg to 50 mg 2 weeks ago. Also felt extremely weak

## 2017-06-21 NOTE — ED Provider Notes (Signed)
[facility] DEPT Provider Note   CSN: [id number] Arrival date & time: 06/21/17  1110     History   Chief Complaint Chief Complaint  Patient presents with  . Shortness of Breath    HPI Charlotte [name] is a 83 y.o. female.  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 She reports chronic weakness, she is chronically immobile, she has had 1 week of increasing amounts of weakness. She was told recently that she was depressed, she was started on Zoloft as she was tapered off of Ativan and during the course of this time she has become progressively short of breath and generally weak. There is no coughing, no fever, no swelling of the lower extremities and she denies any chest pain. She is currently taking 50 mg of Zoloft after tapering up to this over the last 2 weeks. The patient denies any sore throat, dysuria, diarrhea, rectal bleeding or any other complaints. The generalized weakness is definitely general, there is no focal weakness, no difficulty with speech or vision. Family members are at the room and agree with the patient's report.  Past Medical History:  Diagnosis Date  . Anxiety   . Arthritis   . Cancer [facility])    breast cancer  . Depression   . Fatigue   . History of hyperthyroidism    resolved, no medicaions for treatment at this time  . Hypertension   . Insomnia   . OSA (obstructive sleep apnea)   . Pulmonary hypertension [facility])     Patient Active Problem List   Diagnosis Date Noted  . Pulmonary HTN ([place]) 12/23/2016  . Arthritis 12/23/2016  . Essential hypertension 12/09/2016  . 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 Chronic insomnia 11/04/2016  . 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 OSA  (obstructive sleep apnea) 11/04/2016  . Snoring 11/04/2016  . Fatigue due to treatment 11/04/2016  . Malignant neoplasm of upper-inner quadrant of left female breast ([place]) 07/01/2016  . Generalized anxiety disorder 01/28/2016  . SHOULDER, ARTHRITIS, DEGEN./OSTEO 12/23/2009  . IMPINGEMENT SYNDROME 12/23/2009  . HIGH BLOOD PRESSURE 12/23/2009    Past Surgical History:  Procedure Laterality Date  . BREAST LUMPECTOMY     right side  . BREAST LUMPECTOMY WITH AXILLARY LYMPH NODE BIOPSY Left 06/17/2016   Procedure: BREAST LUMPECTOMY WITH AXILLARY LYMPH NODE BIOPSY;  Surgeon: Autumn Messing III, MD;  Location: [place];  Service: General;  Laterality: Left;  . COLONOSCOPY    . DILATION AND CURETTAGE OF UTERUS    . EYE SURGERY     bilateral cataracts removed  . FOOT SURGERY    . PORTACATH PLACEMENT Right 07/16/2016   Procedure: INSERTION PORT-A-CATH RIGHT SUBLCLAVIAN;  Surgeon: Autumn Messing III, MD;  Location: [place];  Service: General;  Laterality: Right;  . TONSILLECTOMY      OB History    Gravida Para Term Preterm AB Living   2 2 2     2    SAB TAB Ectopic Multiple Live Births                   Home Medications    Prior to Admission medications   Medication Sig Start Date End Date Taking? Authorizing Provider  anastrozole (ARIMIDEX) 1 MG tablet Take 1 tablet (  1 mg total) by mouth daily. Patient taking differently: Take 0.5 mg by mouth daily.  10/13/16  Yes Nicholas Lose, MD  aspirin EC 81 MG tablet Take 81 mg by mouth daily.   Yes [provider]  Calcium Carbonate-Vitamin D (CALCIUM 600+D3 PO) Take 1 tablet by mouth 2 (two) times daily.   Yes [provider]  carbamide peroxide (DEBROX) 6.5 % otic solution Place 5 drops into both ears daily as needed (ear wax buildup).    Yes [provider]  cycloSPORINE (RESTASIS) 0.05 % ophthalmic emulsion Place 1 drop into both eyes 2 (two) times daily.   Yes [provider]  felodipine (PLENDIL) 5 MG 24 hr tablet Take  5 mg by mouth daily. Takes in the afternoon around 1500   Yes [provider]  fluticasone (FLONASE) 50 MCG/ACT nasal spray Place 2 sprays into the nose daily as needed for allergies.   Yes [provider]  furosemide (LASIX) 20 MG tablet Take 1 tablet (20 mg total) by mouth daily as needed. Patient taking differently: Take 20 mg by mouth daily as needed for fluid or edema.  01/14/17 06/21/17 Yes BranchAlphonse Guild, MD  hydrALAZINE (APRESOLINE) 50 MG tablet Take 1 tablet (50 mg total) by mouth 2 (two) times daily. Patient taking differently: Take 50 mg by mouth 2 (two) times daily.  06/01/16  Yes Lendon Colonel, NP  Liniments [facility] PAIN RELIEF PATCH) PADS Apply 1 each topically daily as needed (pain).   Yes [provider]  LORazepam (ATIVAN) 1 MG tablet Take 0.5 mg by mouth every 8 (eight) hours as needed for anxiety or sleep.    Yes [provider]  losartan (COZAAR) 100 MG tablet Take 100 mg by mouth daily.   Yes [provider]  Multiple Vitamins-Minerals (CENTRUM ADULTS PO) Take 1 tablet by mouth daily.   Yes [provider]  non-metallic deodorant Jethro Poling) MISC Apply 1 application topically daily.    Yes [provider]  Omega-3 Fatty Acids (FISH OIL TRIPLE STRENGTH) 1400 MG CAPS Take 1 tablet by mouth daily.   Yes [provider]  Polyethyl Glycol-Propyl Glycol (SYSTANE) 0.4-0.3 % SOLN Apply 1 drop to eye 2 (two) times daily.   Yes [provider]  potassium chloride SA (K-DUR,KLOR-CON) 20 MEQ tablet Take 1 tablet (20 mEq total) by mouth daily. 04/16/17  Yes Milton Ferguson, MD  sertraline (ZOLOFT) 25 MG tablet Take 1 tablet (25 mg total) by mouth daily. Patient taking differently: Take 50 mg by mouth daily.  03/15/17  Yes Dohmeier, Asencion Partridge, MD  tretinoin (RETIN-A) 0.025 % cream Apply 1 application topically at bedtime.  06/27/15  Yes [provider]    Family History Family History  Problem Relation  Age of Onset  . Heart attack Mother   . Cancer Mother   . Hypertension Other   . Lung disease Neg Hx   . Rheumatologic disease Neg Hx     Social History Social History  Substance Use Topics  . Smoking status: Passive Smoke Exposure - Never Smoker  . Smokeless tobacco: Never Used     Comment: Husband smoked  . Alcohol use No     Allergies   Latex; Bextra [valdecoxib]; and Motrin [ibuprofen]   Review of Systems Review of Systems  All other systems reviewed and are negative.    Physical Exam Updated Vital Signs BP (!) 176/64   Pulse 63   Temp 98.1 F (36.7 C) (Oral)   Resp Marland Kitchen)  27   Ht 5\' 2"  (1.575 m)   Wt 56.7 kg (125 lb)   SpO2 100%   BMI 22.86 kg/m   Physical Exam  Constitutional: She appears well-developed and well-nourished. No distress.  HENT:  Head: Normocephalic and atraumatic.  Mouth/Throat: Oropharynx is clear and moist. No oropharyngeal exudate.  Mucous members are moist  Eyes: Conjunctivae and EOM are normal. Pupils are equal, round, and reactive to light. Right eye exhibits no discharge. Left eye exhibits no discharge. No scleral icterus.  Neck: Normal range of motion. Neck supple. No JVD present. No thyromegaly present.  Cardiovascular: Normal rate, regular rhythm, normal heart sounds and intact distal pulses.  Exam reveals no gallop and no friction rub.   No murmur heard. Strong pulses at the bilateral radial arteries, no JVD or peripheral edema. No ectopy  Pulmonary/Chest: Effort normal and breath sounds normal. No respiratory distress. She has no wheezes. She has no rales.  Lungs are clear without any rales rhonchi or wheezing.  Abdominal: Soft. Bowel sounds are normal. She exhibits no distension and no mass. There is no tenderness.  Musculoskeletal: Normal range of motion. She exhibits no edema or tenderness.  Lymphadenopathy:    She has no cervical adenopathy.  Neurological: She is alert. Coordination normal.  The patient's speech is clear,  coordination is normal, strength is normal in all 4 extremities when in the supine position. There is no facial nerve deficits, no facial droop, no other cranial nerves III through XII deficits.  Skin: Skin is warm and dry. No rash noted. No erythema.  Psychiatric: She has a normal mood and affect. Her behavior is normal.  Nursing note and vitals reviewed.    ED Treatments / Results  Labs (all labs ordered are listed, but only abnormal results are displayed) Labs Reviewed  COMPREHENSIVE METABOLIC PANEL - Abnormal; Notable for the following:       Result Value   Sodium 132 (*)    Chloride 94 (*)    All other components within normal limits  CBC WITH DIFFERENTIAL/PLATELET - Abnormal; Notable for the following:    RDW 16.7 (*)    All other components within normal limits  D-DIMER, QUANTITATIVE (NOT AT [facility]) - Abnormal; Notable for the following:    D-Dimer, Quant 2.12 (*)    All other components within normal limits  URINALYSIS, ROUTINE W REFLEX MICROSCOPIC - Abnormal; Notable for the following:    Color, Urine STRAW (*)    Specific Gravity, Urine 1.002 (*)    All other components within normal limits  TROPONIN I  BRAIN NATRIURETIC PEPTIDE    EKG  EKG Interpretation  Date/Time:  Monday June 21 2017 11:21:59 EDT Ventricular Rate:  58 PR Interval:    QRS Duration: 101 QT Interval:  475 QTC Calculation: 467 R Axis:   -24 Text Interpretation:  Sinus rhythm Atrial premature complex LVH with secondary repolarization abnormality Anterior Q waves, possibly due to LVH since last tracing no significant change Confirmed by Noemi Chapel [phone]) on 06/21/2017 11:26:22 AM       Radiology Dg Chest 2 View  Result Date: 06/21/2017 CLINICAL DATA:  Shortness of breath and weakness. EXAM: CHEST  2 VIEW COMPARISON:  04/16/2017 FINDINGS: Enlarged cardiac silhouette. Tortuosity and calcific atherosclerotic disease of the aorta. There is no evidence of focal airspace consolidation, pleural  effusion or pneumothorax. Osseous structures are without acute abnormality. Hyperdense appearance of the left chest wall/breast, likely post left lumpectomy IMPRESSION: Enlarged cardiac silhouette. Calcific atherosclerotic disease of  the aorta. Hyperdense appearance of the left chest wall/ breast, post lumpectomy. Please correlate to recent mammography. Electronically Signed   By: Fidela Salisbury M.D.   On: 06/21/2017 12:47   Ct Angio Chest Pe W And/or Wo Contrast  Result Date: 06/21/2017 CLINICAL DATA:  Shortness of breath after taking Zoloft. EXAM: CT ANGIOGRAPHY CHEST WITH CONTRAST TECHNIQUE: Multidetector CT imaging of the chest was performed using the standard protocol during bolus administration of intravenous contrast. Multiplanar CT image reconstructions and MIPs were obtained to evaluate the vascular anatomy. CONTRAST:  100 cc Isovue 370 intravenous COMPARISON:  01/04/2017 FINDINGS: Cardiovascular: Satisfactory opacification of the pulmonary arteries to the segmental level. No evidence of pulmonary embolism. Chronic cardiomegaly. No pericardial effusion. Atherosclerotic calcification of the aorta. Mediastinum/Nodes: Negative for adenopathy. Post treatment changes to the left breast and axilla. Lungs/Pleura: Trace left pleural effusion/pleural thickening, stable. There is subpleural fibrosis and traction bronchiectasis in the lingula, likely related to patient's breast radiotherapy Nonspecific subpleural reticulation at the bases, at least partially atelectasis in the setting of low lung volumes. There is no edema, consolidation, effusion, or pneumothorax. Upper Abdomen: No acute finding. Musculoskeletal: No acute or aggressive finding. Review of the MIP images confirms the above findings. IMPRESSION: 1. Negative for pulmonary embolism or other acute finding. 2. Chronic cardiomegaly. 3. Aortic Atherosclerosis (ICD10-I70.0). 4. 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  Electronically Signed   By: Monte Fantasia M.D.   On: 06/21/2017 15:33    Procedures Procedures (including critical care time)  Medications Ordered in ED Medications  iopamidol (ISOVUE-300) 61 % injection 100 mL (not administered)  hydrALAZINE (APRESOLINE) injection 10 mg (not administered)  iopamidol (ISOVUE-370) 76 % injection 100 mL (100 mLs Intravenous Contrast Given 06/21/17 1516)     Initial Impression / Assessment and Plan / ED Course  I have reviewed the triage vital signs and the nursing notes.  Pertinent labs & imaging results that were available during my care of the patient were reviewed by me and considered in my medical decision making (see chart for details).     The etiology of the patient's symptoms is unclear. I suspect this is going to be related to a benign process however with her recently starting medications this could be medication related. She has pulmonary hypertension but no other abnormal lung findings. We'll obtain chest x-ray him a labs, reevaluate.  The pt has had a non acute CT - she has no PE, no PNA and her labs are reassuring with normal trop, BNP and CBC / CMP - she has ECG without acute ischemia and has had BP which has been elevated though not severely - will be given dose of hydralazine  Pt given copy of all her results after I discussed the resulst with her and familiy with her permission - they will f/u closely with PCP.  Stable for d/c.  Final Clinical Impressions(s) / ED Diagnoses   Final diagnoses:  Pulmonary hypertension ([place])  Essential hypertension  Shortness of breath    New Prescriptions New Prescriptions   No medications on file     Noemi Chapel, MD 06/21/17 1545

## 2017-06-22 ENCOUNTER — Encounter (HOSPITAL_COMMUNITY): Payer: Medicare Other

## 2017-06-24 ENCOUNTER — Encounter (HOSPITAL_COMMUNITY): Payer: Medicare Other

## 2017-06-29 ENCOUNTER — Encounter (HOSPITAL_COMMUNITY): Payer: Medicare Other

## 2017-07-01 ENCOUNTER — Encounter (HOSPITAL_COMMUNITY): Payer: Medicare Other

## 2017-07-02 DIAGNOSIS — F321 Major depressive disorder, single episode, moderate: Secondary | ICD-10-CM | POA: Diagnosis not present

## 2017-07-06 ENCOUNTER — Encounter (HOSPITAL_COMMUNITY): Payer: Medicare Other

## 2017-07-08 ENCOUNTER — Encounter (HOSPITAL_COMMUNITY): Payer: Medicare Other

## 2017-07-13 ENCOUNTER — Encounter (HOSPITAL_COMMUNITY): Payer: Medicare Other

## 2017-07-15 ENCOUNTER — Encounter: Payer: Self-pay | Admitting: Adult Health

## 2017-07-15 ENCOUNTER — Encounter (HOSPITAL_COMMUNITY): Payer: Medicare Other

## 2017-07-20 ENCOUNTER — Encounter (HOSPITAL_COMMUNITY): Payer: Medicare Other

## 2017-07-21 ENCOUNTER — Ambulatory Visit (HOSPITAL_BASED_OUTPATIENT_CLINIC_OR_DEPARTMENT_OTHER): Payer: Medicare Other | Admitting: Adult Health

## 2017-07-21 ENCOUNTER — Encounter: Payer: Self-pay | Admitting: Adult Health

## 2017-07-21 VITALS — BP 200/68 | HR 72 | Temp 98.0°F | Resp 18 | Ht 62.0 in | Wt 125.8 lb

## 2017-07-21 DIAGNOSIS — Z79811 Long term (current) use of aromatase inhibitors: Secondary | ICD-10-CM | POA: Diagnosis not present

## 2017-07-21 DIAGNOSIS — Z170 Estrogen receptor positive status [ER+]: Secondary | ICD-10-CM | POA: Diagnosis not present

## 2017-07-21 DIAGNOSIS — C50212 Malignant neoplasm of upper-inner quadrant of left female breast: Secondary | ICD-10-CM

## 2017-07-21 NOTE — Progress Notes (Signed)
CLINIC:  Survivorship   REASON FOR VISIT:  Routine follow-up post-treatment for a recent history of breast cancer.  BRIEF ONCOLOGIC HISTORY:    Malignant neoplasm of upper-inner quadrant of left female breast ([place])   06/02/2016 Initial Diagnosis    Left breast biopsy 11:00 position: IDC with DCIS, ER 60%, PR 0%, Ki-67 15%, HER-2 negative ratio 1.4 to      06/17/2016 Surgery    Left lumpectomy: IDC grade 2, 2.4 cm, with intermediate grade DCIS, LVID present, margins negative, 0/4 lymph nodes positive, ER 60%, PR 0%, HER-2 positive ratio 2.21, Ki-67 15%, T2 N0 stage II a      07/17/2016 - 08/14/2016 Radiation Therapy    Adjuvant radiation therapy      07/21/2016 - 11/06/2016 Chemotherapy    Herceptin every 3 weeks 1 year along with anastrozole      12/09/2016 - 12/12/2016 Hospital Admission    Shortness of breath possibly pneumonia vs CHF       INTERVAL HISTORY:  Charlotte [name] presents to the [facility] today for our initial meeting to review her survivorship care plan detailing her treatment course for breast cancer, as well as monitoring long-term side effects of that treatment, education regarding health maintenance, screening, and overall wellness and health promotion.     Overall, Charlotte [name] reports feeling quite well.  She denies any issues today.  She is up to date with her cancer screenings and sees her PCP regularly.  She is taking Anastrozole daily and does have some mild joint aches and pains, but is otherwise without any concerns today.      REVIEW OF SYSTEMS:  Review of Systems  Constitutional: Negative for appetite change, chills, fatigue, fever and unexpected weight change.  HENT:   Negative for hearing loss, lump/mass and mouth sores.   Eyes: Negative for eye problems and icterus.  Respiratory: Negative for chest tightness, cough and shortness of breath.   Cardiovascular: Negative for chest pain, leg swelling and palpitations.  Gastrointestinal:  Negative for abdominal distention and abdominal pain.  Endocrine: Negative for hot flashes.  Musculoskeletal: Positive for arthralgias.  Skin: Negative for itching and rash.  Neurological: Negative for dizziness, extremity weakness, headaches and numbness.  Hematological: Negative for adenopathy. Does not bruise/bleed easily.  Psychiatric/Behavioral: Negative for depression. The patient is not nervous/anxious.   Breast: Denies any new nodularity, masses, tenderness, nipple changes, or nipple discharge.      ONCOLOGY TREATMENT TEAM:  1. Surgeon:  Dr. Marlou Starks at [facility] 2. Medical Oncologist: Dr. Lindi Adie  3. Radiation Oncologist: Dr. Lisbeth Renshaw    PAST MEDICAL/SURGICAL HISTORY:  Past Medical History:  Diagnosis Date  . Anxiety   . Arthritis   . Cancer [facility])    breast cancer  . Depression   . Fatigue   . History of hyperthyroidism    resolved, no medicaions for treatment at this time  . Hypertension   . Insomnia   . OSA (obstructive sleep apnea)   . Pulmonary hypertension ([place])    Past Surgical History:  Procedure Laterality Date  . BREAST LUMPECTOMY     right side  . BREAST LUMPECTOMY WITH AXILLARY LYMPH NODE BIOPSY Left 06/17/2016   Procedure: BREAST LUMPECTOMY WITH AXILLARY LYMPH NODE BIOPSY;  Surgeon: Autumn Messing III, MD;  Location: [place];  Service: General;  Laterality: Left;  . COLONOSCOPY    . DILATION AND CURETTAGE OF UTERUS    . EYE SURGERY     bilateral cataracts removed  .  FOOT SURGERY    . PORTACATH PLACEMENT Right 07/16/2016   Procedure: INSERTION PORT-A-CATH RIGHT SUBLCLAVIAN;  Surgeon: Autumn Messing III, MD;  Location: [place];  Service: General;  Laterality: Right;  . TONSILLECTOMY       ALLERGIES:  Allergies  Allergen Reactions  . Latex Swelling    SEVERITY OF SWELLING NOT DEFINED.  Marland Kitchen Bextra [Valdecoxib] Other (See Comments)    Stomach Pains  . Motrin [Ibuprofen] Other (See Comments)    Stomach pain     CURRENT MEDICATIONS:  Outpatient  Encounter Prescriptions as of 07/21/2017  Medication Sig  . anastrozole (ARIMIDEX) 1 MG tablet Take 1 tablet (1 mg total) by mouth daily. (Patient taking differently: Take 0.5 mg by mouth daily. )  . aspirin EC 81 MG tablet Take 81 mg by mouth daily.  . Calcium Carbonate-Vitamin D (CALCIUM 600+D3 PO) Take 1 tablet by mouth 2 (two) times daily.  . carbamide peroxide (DEBROX) 6.5 % otic solution Place 5 drops into both ears daily as needed (ear wax buildup).   . cycloSPORINE (RESTASIS) 0.05 % ophthalmic emulsion Place 1 drop into both eyes 2 (two) times daily.  . felodipine (PLENDIL) 5 MG 24 hr tablet Take 5 mg by mouth daily. Takes in the afternoon around 1500  . hydrALAZINE (APRESOLINE) 50 MG tablet Take 1 tablet (50 mg total) by mouth 2 (two) times daily. (Patient taking differently: Take 50 mg by mouth 2 (two) times daily. )  . Liniments (SALONPAS PAIN RELIEF PATCH) PADS Apply 1 each topically daily as needed (pain).  . LORazepam (ATIVAN) 1 MG tablet Take 0.5 mg by mouth every 8 (eight) hours as needed for anxiety or sleep.   Marland Kitchen losartan (COZAAR) 100 MG tablet Take 100 mg by mouth daily.  . Multiple Vitamins-Minerals (CENTRUM ADULTS PO) Take 1 tablet by mouth daily.  . non-metallic deodorant Jethro Poling) MISC Apply 1 application topically daily.   . Omega-3 Fatty Acids (FISH OIL TRIPLE STRENGTH) 1400 MG CAPS Take 1 tablet by mouth daily.  Vladimir Faster Glycol-Propyl Glycol (SYSTANE) 0.4-0.3 % SOLN Apply 1 drop to eye 2 (two) times daily.  . sertraline (ZOLOFT) 25 MG tablet Take 1 tablet (25 mg total) by mouth daily. (Patient taking differently: Take 50 mg by mouth daily. )  . traZODone (DESYREL) 50 MG tablet Take 50 mg by mouth at bedtime.  . tretinoin (RETIN-A) 0.025 % cream Apply 1 application topically at bedtime.   . fluticasone (FLONASE) 50 MCG/ACT nasal spray Place 2 sprays into the nose daily as needed for allergies.  . potassium chloride SA (K-DUR,KLOR-CON) 20 MEQ tablet Take 1 tablet (20 mEq  total) by mouth daily. (Patient not taking: Reported on 07/21/2017)  . [DISCONTINUED] furosemide (LASIX) 20 MG tablet Take 1 tablet (20 mg total) by mouth daily as needed. (Patient taking differently: Take 20 mg by mouth daily as needed for fluid or edema. )   No facility-administered encounter medications on file as of 07/21/2017.      ONCOLOGIC FAMILY HISTORY:  Family History  Problem Relation Age of Onset  . Heart attack Mother   . Cancer Mother   . Hypertension Other   . Lung disease Neg Hx   . Rheumatologic disease Neg Hx      GENETIC COUNSELING/TESTING: Not at this time  SOCIAL HISTORY:  Charlotte [name] is married and lives with her husband and son in [place], [place].  She has 2 children and they live in [place].  Ms. Tryon is currently retired.  She denies any current or history of tobacco, alcohol, or illicit drug use.     PHYSICAL EXAMINATION:  Vital Signs:   Vitals:   07/21/17 0924  BP: (!) 200/68  Pulse: 72  Resp: 18  Temp: 98 F (36.7 C)   Filed Weights   07/21/17 0924  Weight: 125 lb 12.8 oz (57.1 kg)   General: Well-nourished, well-appearing female in no acute distress.  She is accompanied by her daughter today.   HEENT: Head is normocephalic.  Pupils equal and reactive to light. Conjunctivae clear without exudate.  Sclerae anicteric. Oral mucosa is pink, moist.  Oropharynx is pink without lesions or erythema.  Lymph: No cervical, supraclavicular, or infraclavicular lymphadenopathy noted on palpation.  Cardiovascular: Regular rate and rhythm.Marland Kitchen Respiratory: Clear to auscultation bilaterally. Chest expansion symmetric; breathing non-labored.  GI: Abdomen soft and round; non-tender, non-distended. Bowel sounds normoactive.  GU: Deferred.  Neuro: No focal deficits. Steady gait.  Psych: Mood and affect normal and appropriate for situation.  Extremities: No edema. MSK: No focal spinal tenderness to palpation.  Full range of motion in bilateral upper  extremities Skin: Warm and dry.  LABORATORY DATA:  None for this visit.  DIAGNOSTIC IMAGING:  None for this visit.      ASSESSMENT AND PLAN:  Ms.. [name] is a pleasant 83 y.o. female with Stage IIA left breast invasive ductal carcinoma, ER+/PR-/HER2+, diagnosed in 05/2016, treated with lumpectomy, adjuvant radiation therapy, adjuvant Herceptin x 3 months (stopped due to side effects) and anti-estrogen therapy with Anastrozole starting in 07/2016.  She presents to the Survivorship Clinic for our initial meeting and routine follow-up post-completion of treatment for breast cancer.    1. Stage IIA left breast cancer:  Charlotte [name] is continuing to recover from definitive treatment for breast cancer. She will follow-up with her medical oncologist, Dr. Lindi Adie in October, 2018 with history and physical exam per surveillance protocol.  She will continue her anti-estrogen therapy with Anastrozole. Thus far, she is tolerating the Anastrozole well, with minimal side effects. Today, a comprehensive survivorship care plan and treatment summary was reviewed with the patient today detailing her breast cancer diagnosis, treatment course, potential late/long-term effects of treatment, appropriate follow-up care with recommendations for the future, and patient education resources.  A copy of this summary, along with a letter will be sent to the patient's primary care provider via mail/fax/In Basket message after today's visit.    2. Bone health:  Given Charlotte [name]'s age/history of breast cancer and her current treatment regimen including anti-estrogen therapy with Anastrozole, she is at risk for bone demineralization.  She does not know what a DEXA scan is or whether or not she has had one before.  Given her age and the fact that she is on a calcium/vitamin d supplement, she will check with her PCP and see if they have done one on her.  In the meantime, she was encouraged to increase her consumption of foods rich in  calcium, as well as increase her weight-bearing activities.  She was given education on specific activities to promote bone health.  3. Cancer screening:  Due to Charlotte [name]'s history and her age, she should receive screening for skin cancers.  The information and recommendations are listed on the patient's comprehensive care plan/treatment summary and were reviewed in detail with the patient.    4. Health maintenance and wellness promotion: Charlotte [name] was encouraged to consume 5-7 servings of fruits and vegetables per day. We reviewed the "Nutrition Rainbow" handout, as  well as the handout "Take Control of Your Health and Reduce Your Cancer Risk" from the [facility].  She was also encouraged to engage in moderate to vigorous exercise for 30 minutes per day most days of the week. We discussed the [employer] fitness program, which is designed for cancer survivors to help them become more physically fit after cancer treatments.  She was instructed to limit her alcohol consumption and continue to abstain from tobacco use.     5. Support services/counseling: It is not uncommon for this period of the patient's cancer care trajectory to be one of many emotions and stressors.  We discussed an opportunity for her to participate in the next session of [facility] ("Finding Your New Normal") support group series designed for patients after they have completed treatment.   Charlotte [name] was encouraged to take advantage of our many other support services programs, support groups, and/or counseling in coping with her new life as a cancer survivor after completing anti-cancer treatment.  She was offered support today through active listening and expressive supportive counseling.  She was given information regarding our available services and encouraged to contact me with any questions or for help enrolling in any of our support group/programs.    Dispo:   -Return to cancer center in October for follow up with Dr.  Lindi Adie  -Mammogram due in 05/2018 -She is welcome to return back to the Survivorship Clinic at any time; no additional follow-up needed at this time.  -Consider referral back to survivorship as a long-term survivor for continued surveillance  A total of (30) minutes of face-to-face time was spent with this patient with greater than 50% of that time in counseling and care-coordination.   Gardenia Phlegm, NP Survivorship Program [facility] [phone]   Note: PRIMARY CARE PROVIDER Rosita Fire, [place] [phone]

## 2017-07-22 ENCOUNTER — Encounter (HOSPITAL_COMMUNITY): Payer: Medicare Other

## 2017-07-27 ENCOUNTER — Encounter (HOSPITAL_COMMUNITY): Payer: Medicare Other

## 2017-07-29 ENCOUNTER — Encounter (HOSPITAL_COMMUNITY): Payer: Medicare Other

## 2017-08-03 ENCOUNTER — Encounter (HOSPITAL_COMMUNITY): Payer: Medicare Other

## 2017-08-04 DIAGNOSIS — Z961 Presence of intraocular lens: Secondary | ICD-10-CM | POA: Diagnosis not present

## 2017-08-04 DIAGNOSIS — H04123 Dry eye syndrome of bilateral lacrimal glands: Secondary | ICD-10-CM | POA: Diagnosis not present

## 2017-08-04 DIAGNOSIS — H6123 Impacted cerumen, bilateral: Secondary | ICD-10-CM | POA: Diagnosis not present

## 2017-08-05 ENCOUNTER — Encounter (HOSPITAL_COMMUNITY): Payer: Medicare Other

## 2017-08-09 ENCOUNTER — Other Ambulatory Visit (HOSPITAL_COMMUNITY): Payer: Self-pay | Admitting: Internal Medicine

## 2017-08-09 DIAGNOSIS — Z780 Asymptomatic menopausal state: Secondary | ICD-10-CM

## 2017-08-09 DIAGNOSIS — Z1389 Encounter for screening for other disorder: Secondary | ICD-10-CM | POA: Diagnosis not present

## 2017-08-09 DIAGNOSIS — I10 Essential (primary) hypertension: Secondary | ICD-10-CM | POA: Diagnosis not present

## 2017-08-09 DIAGNOSIS — G4733 Obstructive sleep apnea (adult) (pediatric): Secondary | ICD-10-CM | POA: Diagnosis not present

## 2017-08-09 DIAGNOSIS — F339 Major depressive disorder, recurrent, unspecified: Secondary | ICD-10-CM | POA: Diagnosis not present

## 2017-08-09 DIAGNOSIS — C50912 Malignant neoplasm of unspecified site of left female breast: Secondary | ICD-10-CM | POA: Diagnosis not present

## 2017-08-10 ENCOUNTER — Encounter (HOSPITAL_COMMUNITY): Payer: Medicare Other

## 2017-08-12 ENCOUNTER — Ambulatory Visit (HOSPITAL_COMMUNITY)
Admission: RE | Admit: 2017-08-12 | Discharge: 2017-08-12 | Disposition: A | Discharge status: Home / Self Care | Payer: Medicare Other | Source: Ambulatory Visit | Attending: Internal Medicine | Admitting: Internal Medicine

## 2017-08-12 ENCOUNTER — Encounter (HOSPITAL_COMMUNITY): Payer: Medicare Other

## 2017-08-12 DIAGNOSIS — Z780 Asymptomatic menopausal state: Secondary | ICD-10-CM | POA: Insufficient documentation

## 2017-08-12 DIAGNOSIS — M8588 Other specified disorders of bone density and structure, other site: Secondary | ICD-10-CM | POA: Insufficient documentation

## 2017-08-12 DIAGNOSIS — M810 Age-related osteoporosis without current pathological fracture: Secondary | ICD-10-CM | POA: Diagnosis not present

## 2017-08-17 ENCOUNTER — Encounter (HOSPITAL_COMMUNITY): Payer: Medicare Other

## 2017-08-19 ENCOUNTER — Encounter (HOSPITAL_COMMUNITY): Payer: Medicare Other

## 2017-08-31 ENCOUNTER — Other Ambulatory Visit: Payer: Self-pay | Admitting: Hematology and Oncology

## 2017-08-31 DIAGNOSIS — F321 Major depressive disorder, single episode, moderate: Secondary | ICD-10-CM | POA: Diagnosis not present

## 2017-09-09 ENCOUNTER — Encounter (HOSPITAL_COMMUNITY): Payer: Self-pay | Admitting: Emergency Medicine

## 2017-09-09 ENCOUNTER — Emergency Department (HOSPITAL_COMMUNITY): Payer: Medicare Other

## 2017-09-09 ENCOUNTER — Emergency Department (HOSPITAL_COMMUNITY)
Admission: EM | Admit: 2017-09-09 | Discharge: 2017-09-09 | Disposition: A | Discharge status: Home / Self Care | Payer: Medicare Other | Attending: Emergency Medicine | Admitting: Emergency Medicine

## 2017-09-09 DIAGNOSIS — I10 Essential (primary) hypertension: Secondary | ICD-10-CM | POA: Diagnosis not present

## 2017-09-09 DIAGNOSIS — E876 Hypokalemia: Secondary | ICD-10-CM

## 2017-09-09 DIAGNOSIS — Z7722 Contact with and (suspected) exposure to environmental tobacco smoke (acute) (chronic): Secondary | ICD-10-CM | POA: Diagnosis not present

## 2017-09-09 DIAGNOSIS — Z853 Personal history of malignant neoplasm of breast: Secondary | ICD-10-CM | POA: Insufficient documentation

## 2017-09-09 DIAGNOSIS — R0602 Shortness of breath: Secondary | ICD-10-CM | POA: Insufficient documentation

## 2017-09-09 DIAGNOSIS — Z79899 Other long term (current) drug therapy: Secondary | ICD-10-CM | POA: Insufficient documentation

## 2017-09-09 DIAGNOSIS — Z7982 Long term (current) use of aspirin: Secondary | ICD-10-CM | POA: Diagnosis not present

## 2017-09-09 DIAGNOSIS — Z91040 Latex allergy status: Secondary | ICD-10-CM | POA: Insufficient documentation

## 2017-09-09 LAB — BRAIN NATRIURETIC PEPTIDE: B NATRIURETIC PEPTIDE 5: 77 pg/mL (ref 0.0–100.0)

## 2017-09-09 LAB — COMPREHENSIVE METABOLIC PANEL
ALK PHOS: 61 U/L (ref 38–126)
ALT: 17 U/L (ref 14–54)
AST: 22 U/L (ref 15–41)
Albumin: 4 g/dL (ref 3.5–5.0)
Anion gap: 13 (ref 5–15)
BUN: 16 mg/dL (ref 6–20)
CALCIUM: 9.6 mg/dL (ref 8.9–10.3)
CO2: 25 mmol/L (ref 22–32)
CREATININE: 0.9 mg/dL (ref 0.44–1.00)
Chloride: 97 mmol/L — ABNORMAL LOW (ref 101–111)
GFR, EST NON AFRICAN AMERICAN: 56 mL/min — AB (ref >60)
Glucose, Bld: 110 mg/dL — ABNORMAL HIGH (ref 65–99)
Potassium: 3.1 mmol/L — ABNORMAL LOW (ref 3.5–5.1)
Sodium: 135 mmol/L (ref 135–145)
Total Bilirubin: 0.7 mg/dL (ref 0.3–1.2)
Total Protein: 7.9 g/dL (ref 6.5–8.1)

## 2017-09-09 LAB — CBC WITH DIFFERENTIAL/PLATELET
Basophils Absolute: 0 10*3/uL (ref 0.0–0.1)
Basophils Relative: 0 %
Eosinophils Absolute: 0.1 10*3/uL (ref 0.0–0.7)
Eosinophils Relative: 1 %
HCT: 36.6 % (ref 36.0–46.0)
HEMOGLOBIN: 12.1 g/dL (ref 12.0–15.0)
LYMPHS ABS: 2.4 10*3/uL (ref 0.7–4.0)
LYMPHS PCT: 37 %
MCH: 26.9 pg (ref 26.0–34.0)
MCHC: 33.1 g/dL (ref 30.0–36.0)
MCV: 81.5 fL (ref 78.0–100.0)
Monocytes Absolute: 0.4 10*3/uL (ref 0.1–1.0)
Monocytes Relative: 6 %
NEUTROS ABS: 3.7 10*3/uL (ref 1.7–7.7)
NEUTROS PCT: 56 %
Platelets: 328 10*3/uL (ref 150–400)
RBC: 4.49 MIL/uL (ref 3.87–5.11)
RDW: 16 % — ABNORMAL HIGH (ref 11.5–15.5)
WBC: 6.6 10*3/uL (ref 4.0–10.5)

## 2017-09-09 LAB — TROPONIN I

## 2017-09-09 MED ORDER — LORAZEPAM 0.5 MG PO TABS
0.5000 mg | ORAL_TABLET | Freq: Once | ORAL | Status: AC
  Administered 2017-09-09: 0.5 mg via ORAL
  Filled 2017-09-09: qty 1

## 2017-09-09 MED ORDER — HYDRALAZINE HCL 50 MG PO TABS: 50.0000 mg | ORAL_TABLET | Freq: Three times a day (TID) | ORAL | 1 refills | Status: DC

## 2017-09-09 MED ORDER — POTASSIUM CHLORIDE CRYS ER 20 MEQ PO TBCR
40.0000 meq | EXTENDED_RELEASE_TABLET | Freq: Once | ORAL | Status: AC
  Administered 2017-09-09: 40 meq via ORAL
  Filled 2017-09-09: qty 2

## 2017-09-09 NOTE — ED Triage Notes (Signed)
Patient states she wears a cpap at night and woke up short of breath. Patient breathing labored.

## 2017-09-09 NOTE — ED Notes (Signed)
Family at bedside. 

## 2017-09-09 NOTE — Discharge Instructions (Signed)
I discussed the management of her blood pressure with the doctor who was covering for Dr. Legrand Rams who agreed that you should change your hydralazine to 3 times per day.  Please check your blood pressure 2 hours after taking her medication the morning.  Return to the emergency department for severe or worsening difficulty breathing.

## 2017-09-09 NOTE — ED Provider Notes (Signed)
[facility] DEPT Provider Note   CSN: [id number] Arrival date & time: 09/09/17  0718     History   Chief Complaint Chief Complaint  Patient presents with  . Shortness of Breath    HPI Charlotte [name] is a 83 y.o. female.  HPI  Had SOB that started at 5:30 AM - no sick contacts.  Has hx of pulm htn and sleep apnea / anxiety, has had a new sleep aid in the last 3 days - "Belsomra" - BRCA as well.    The family reports that yesterday she was in her normal state of health - awoke with SOB this morning -   Sx are constant since this morning - nothing makes better or worse, no associated fevers, coughing or swelling of the legs.  Past Medical History:  Diagnosis Date  . Anxiety   . Arthritis   . Cancer [facility])    breast cancer  . Depression   . Fatigue   . History of hyperthyroidism    resolved, no medicaions for treatment at this time  . Hypertension   . Insomnia   . OSA (obstructive sleep apnea)   . Pulmonary hypertension [facility])     Patient Active Problem List   Diagnosis Date Noted  . Pulmonary HTN ([place]) 12/23/2016  . Arthritis 12/23/2016  . Essential hypertension 12/09/2016  . 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 Chronic insomnia 11/04/2016  . 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 OSA (obstructive sleep apnea) 11/04/2016  . Snoring 11/04/2016  . Fatigue due to treatment 11/04/2016  . Malignant neoplasm of upper-inner quadrant of left female breast ([place]) 07/01/2016  . Generalized anxiety disorder 01/28/2016  . SHOULDER, ARTHRITIS, DEGEN./OSTEO 12/23/2009  . IMPINGEMENT SYNDROME 12/23/2009  . HIGH BLOOD PRESSURE 12/23/2009    Past Surgical History:  Procedure Laterality Date  . BREAST LUMPECTOMY     right side  . BREAST LUMPECTOMY WITH AXILLARY LYMPH NODE BIOPSY Left 06/17/2016   Procedure: BREAST LUMPECTOMY WITH AXILLARY LYMPH NODE BIOPSY;  Surgeon: Autumn Messing III, MD;  Location: [place];  Service: General;  Laterality: Left;  .  COLONOSCOPY    . DILATION AND CURETTAGE OF UTERUS    . EYE SURGERY     bilateral cataracts removed  . FOOT SURGERY    . PORTACATH PLACEMENT Right 07/16/2016   Procedure: INSERTION PORT-A-CATH RIGHT SUBLCLAVIAN;  Surgeon: Autumn Messing III, MD;  Location: [place];  Service: General;  Laterality: Right;  . TONSILLECTOMY      OB History    Gravida Para Term Preterm AB Living   2 2 2     2    SAB TAB Ectopic Multiple Live Births                   Home Medications    Prior to Admission medications   Medication Sig Start Date End Date Taking? Authorizing Provider  anastrozole (ARIMIDEX) 1 MG tablet TAKE ONE TABLET BY MOUTH ONCE DAILY. 09/01/17  Yes Nicholas Lose, MD  aspirin EC 81 MG tablet Take 81 mg by mouth daily.   Yes [provider]  BELSOMRA 5 MG TABS Take 1 tablet by mouth daily. 09/02/17  Yes [provider]  Calcium Carbonate-Vitamin D (CALCIUM 600+D3 PO) Take 1 tablet by mouth 2 (two) times daily.   Yes [provider]  carbamide peroxide (DEBROX) 6.5 % otic solution Place 5 drops into both ears daily as needed (ear wax buildup).    Yes [provider]  cycloSPORINE (RESTASIS) 0.05 % ophthalmic emulsion Place 1 drop into both eyes 2 (two) times daily.   Yes [provider]  felodipine (PLENDIL) 5 MG 24 hr tablet Take 5 mg by mouth daily. Takes in the afternoon around 1500   Yes [provider]  fluticasone (FLONASE) 50 MCG/ACT nasal spray Place 2 sprays into the nose daily as needed for allergies.   Yes [provider]  Liniments (SALONPAS PAIN RELIEF PATCH) PADS Apply 1 each topically daily as needed (pain).   Yes [provider]  losartan (COZAAR) 100 MG tablet Take 100 mg by mouth daily.   Yes [provider]  non-metallic deodorant Jethro Poling) MISC Apply 1 application topically daily.    Yes [provider]  Omega-3 Fatty Acids (FISH OIL TRIPLE STRENGTH) 1400 MG CAPS Take 1 tablet by mouth daily.    Yes [provider]  Polyethyl Glycol-Propyl Glycol (SYSTANE) 0.4-0.3 % SOLN Apply 1 drop to eye 2 (two) times daily.   Yes [provider]  potassium chloride SA (K-DUR,KLOR-CON) 20 MEQ tablet Take 1 tablet (20 mEq total) by mouth daily. 04/16/17  Yes Milton Ferguson, MD  sertraline (ZOLOFT) 100 MG tablet Take 1 tablet by mouth daily. 08/13/17  Yes [provider]  tretinoin (RETIN-A) 0.025 % cream Apply 1 application topically at bedtime.  06/27/15  Yes [provider]  hydrALAZINE (APRESOLINE) 50 MG tablet Take 1 tablet (50 mg total) by mouth 3 (three) times daily. 09/09/17   Noemi Chapel, MD    Family History Family History  Problem Relation Age of Onset  . Heart attack Mother   . Cancer Mother   . Hypertension Other   . Lung disease Neg Hx   . Rheumatologic disease Neg Hx     Social History Social History  Substance Use Topics  . Smoking status: Passive Smoke Exposure - Never Smoker  . Smokeless tobacco: Never Used     Comment: Husband smoked  . Alcohol use No     Allergies   Latex; Bextra [valdecoxib]; and Motrin [ibuprofen]   Review of Systems Review of Systems  All other systems reviewed and are negative.    Physical Exam Updated Vital Signs BP (!) 169/75 (BP Location: Left Arm)   Pulse (!) 57   Temp 98.1 F (36.7 C) (Oral)   Resp 16   SpO2 98%   Physical Exam  Constitutional: She appears well-developed and well-nourished. No distress.  HENT:  Head: Normocephalic and atraumatic.  Mouth/Throat: Oropharynx is clear and moist. No oropharyngeal exudate.  Eyes: Pupils are equal, round, and reactive to light. Conjunctivae and EOM are normal. Right eye exhibits no discharge. Left eye exhibits no discharge. No scleral icterus.  Neck: Normal range of motion. Neck supple. No JVD present. No thyromegaly present.  Cardiovascular: Normal rate, regular rhythm, normal heart sounds and intact distal pulses.  Exam reveals no gallop and no  friction rub.   No murmur heard. Pulmonary/Chest: Effort normal and breath sounds normal. No respiratory distress. She has no wheezes. She has no rales.  Mild tachypnea  Abdominal: Soft. Bowel sounds are normal. She exhibits no distension and no mass. There is no tenderness.  Musculoskeletal: Normal range of motion. She exhibits no edema or tenderness.  Lymphadenopathy:    She has no cervical adenopathy.  Neurological: She is alert. Coordination normal.  Skin: Skin is warm and dry. No rash noted. No erythema.  Psychiatric: She has a normal mood and affect. Her behavior is normal.  Nursing note and vitals reviewed.    ED Treatments / Results  Labs (all labs ordered are listed, but only abnormal results are displayed) Labs Reviewed  CBC WITH DIFFERENTIAL/PLATELET - Abnormal; Notable for the following:       Result Value   RDW 16.0 (*)    All other components within normal limits  COMPREHENSIVE METABOLIC PANEL - Abnormal; Notable for the following:    Potassium 3.1 (*)    Chloride 97 (*)    Glucose, Bld 110 (*)    GFR calc non Af Amer 56 (*)    All other components within normal limits  BRAIN NATRIURETIC PEPTIDE  TROPONIN I    EKG  EKG Interpretation  Date/Time:  Thursday September 09 2017 07:29:57 EDT Ventricular Rate:  62 PR Interval:    QRS Duration: 107 QT Interval:  420 QTC Calculation: 427 R Axis:   -40 Text Interpretation:  Sinus rhythm Left anterior fascicular block LVH with secondary repolarization abnormality Anterior Q waves, possibly due to LVH Baseline wander in lead(s) V6 since last tracing no significant change Confirmed by Noemi Chapel [id number]) on 09/09/2017 7:54:50 AM       Radiology Dg Chest 2 View  Result Date: 09/09/2017 CLINICAL DATA:  Short of breath EXAM: CHEST  2 VIEW COMPARISON:  06/21/2017 FINDINGS: Mild cardiomegaly. Fibrotic changes in the left lung are stable. Right lung is clear. No pneumothorax. No pleural effusion. Stable thoracic spine.  IMPRESSION: Cardiomegaly without edema.  Stable fibrosis in the left lung. Electronically Signed   By: Marybelle Killings M.D.   On: 09/09/2017 08:09    Procedures Procedures (including critical care time)  Medications Ordered in ED Medications  LORazepam (ATIVAN) tablet 0.5 mg (0.5 mg Oral Given 09/09/17 0926)  potassium chloride SA (K-DUR,KLOR-CON) CR tablet 40 mEq (40 mEq Oral Given 09/09/17 0925)     Initial Impression / Assessment and Plan / ED Course  I have reviewed the triage vital signs and the nursing notes.  Pertinent labs & imaging results that were available during my care of the patient were reviewed by me and considered in my medical decision making (see chart for details).  The patient actually has normal lung sounds, she has a normal cardiac rhythm however she does have occasional ectopy. Her shortness of breath could be multifactorial but certainly has recently started a new sleep aid, she has been underdosed on her blood pressure medications taking her hydralazine orally twice a day which could be a reason for her significant hypertension that she is noting at home. She does report she has had measurements as high as 220, she is 200/70 at this time. She does not appear to be in any distress, her x-ray shows some cardiomegaly without edema, will check a bedside ultrasound to make sure she does not have pericardial effusion. The patient is in agreement with the workup.  Have d/w Dr. Josephine Cables covering physician - agreeable to change Hydralazine to TID.  SOB resolved with 1/2 mg of ativan PO - doing well, no acute findings - no leukocytosis.  Mild hypo K  Pt aware of findings Stable for d/c I did bedside US - there is no pericardial effusion familiy in agreement.  Final Clinical Impressions(s) / ED Diagnoses   Final diagnoses:  SOB (shortness of breath)  Hypokalemia    New Prescriptions New Prescriptions   HYDRALAZINE (APRESOLINE) 50 MG TABLET    Take 1 tablet (50 mg total)  by mouth 3 (three) times daily.  Noemi Chapel, MD 09/09/17 [phone]

## 2017-09-14 NOTE — Progress Notes (Signed)
[facility]  PATIENT: Charlotte [name] DOB: 03-14-1928   REASON FOR VISIT: Obstructive sleep apnea with follow-up CPAP compliance HISTORY FROM: Patient and daughter    HISTORY OF PRESENT ILLNESS:Charlotte [name] is a 83 y.o. female , seen here as a referral  from Dr. Legrand Rams for a sleep consultation,   ON July 27th of this year a breast cancer was finally identified as cause of her discomfort, fatigue, and  insomnia, she had a whole year of anxiety , unexplained weight loss, and illness- poorly defined.  This is per patients report.  Charlotte [name] had visited the local emergency room in [place] before many times and always was sent home without a diagnosis. She underwent a mammogram which did not indicate breast cancer but turned out that her breast cancer was difficult to find as it was located in the very outer upper quadrant of the left breast. In July she underwent surgery and now just begun chemotherapy. She has also a Port-A-Cath implanted into the right chest for ongoing chemotherapy. Charlotte [name] also had suffered from another breast cancer  Before-  she underwent a lumpectomy of the right breast in 2001. She has echocardiogram every 3 month while on Chemotherapy. She feels excessively sleepy since being on chemotherapy and after radiation. Her doctors feel that she should be able to regain her appetite and alongside get some energy and enjoy her life. She feels that her insomnia and the frequent fragmentation of her sleep is a hindrance. She has anxiety and can only sleep 2-4 hours at a time and the rest of the night she spends pacing the halls. She is very anxious and her daughter describes her as almost jittery, restless, driven and pressured, to a frantic degree. She went to a psychologist to evaluate her anxiety and insomnia, but supposedly was told that she doesn't have to return and doesn't need psychiatric or psychological help. She saw Dr. Harrington Challenger in [place]. She is  now here to rule out organic causes for Insomnia.   Sleep habits are as follows:She usually takes a Tylenol PM by 10:00 but often doesn't feel that she is tired enough to go to sleep or even to retreat. She watches TV at night and sometimes may spend all day in the bedroom. She blames her fatigue for not being able to rise and do a lot of activities these days. She lives in her bedroom. This has affected her circadian rhythm and days and nights have started to look alike. Sleep can be distributed all over the day and short naps but at night she struggles to get to a continuous sustain sleep pattern. She has nocturia 3-4 times each night. She is unable to tell me a rise time -  7.30 the earliest, and always up because she needs to take medication- but goes back to bed. She may not get out of bed until 12 noon.  She also likes to read from the I-Pad while in the bedroom,  which is another screen, she likes to watch TV which is screen light penetrating the bedroom. We have discussed this today in our meeting with her daughter, who is here today. She doesn't leave the house, very rarely exposes herself to day light. She cannot muster the energy to get up in AM,   I would like for her psychologist to discuss sleep hygiene rules with her as  I will also do today.  Sleep medical history and family sleep history:  No known apnea  in the family.  Social history: married , lives with her husband , who still drives.   Interval history from 03/15/2017. Charlotte [name] is here today to follow-up on her recent sleep study. She states that she still has shortness of breath, bouts of insomnia and anxiety at night. She also tends to be agitated all day long. As her daughter tells me.  She takes twice a day Ativan usually one at night to allow her to go to sleep at all, another one in the morning when she wakes up very anxious but not daily. Ativan as prescribed by her primary care physician. I would like for the  patient to be on an SSRI such as Zoloft to help with the underlying agitation and anxiety rather than take Ativan at her age.  The patient was here to be evaluated for organic reasons of insomnia and tested in a sleep study on 11/25/2016 positive for sleep apnea. Her AHI was 16.1, her RDI was 21.4 indicating that she is a loud snorer. She slept all night in nonsupine position and he did not reach REM sleep. There was a prolonged oxygen desaturation time of 77 minutes noted but no periodic limb movements and isolated premature ventricular contractions were noted that she stayed mainly in sinus rhythm. I have followed this sleep study with a CPAP titration dated 12/29/2016, which revealed an AHI of 1.0 the patient was titrated to 11 cm water with a full face mask.  A [employer]  simplus fullface mask in medium size was used and an auto titration between 6 and 12 cm water was prescribed with 2 cm EPR.The machine was to be BiPAP compatible if the need arises. UPDATE 09/26/2018CM Charlotte [name], 83 year old female returns for follow-up history of obstructive sleep apnea using CPAP. Compliance data dated 08/15/2017 - 9 /24/ 2018 shows 100% compliance greater than 4 hours for 30 days. Average usage 8 hours 6 minutes. BiPAP pressure 6-12 cm. EPR level 2. AHI 1.1.Ess 3. She was recently seen in the emergency room for shortness of breath. No changes to her medication except for increase in her blood pressure medication which she did not take this morning and blood pressure noted to be elevated. She was encouraged to take her medication as she gets home. She returns for reevaluation  REVIEW OF SYSTEMS: Full 14 system review of systems performed and notable only for those listed, all others are neg:  Constitutional: neg  Cardiovascular: neg Ear/Nose/Throat: neg  Skin: neg Eyes: neg Respiratory: Shortness of breath cough Gastroitestinal: neg  Hematology/Lymphatic: neg  Endocrine:  neg Musculoskeletal:neg Allergy/Immunology: neg Neurological: neg Psychiatric: Depression and anxiety Sleep : Obstructive sleep apnea with CPAP ALLERGIES: Allergies  Allergen Reactions  . Latex Swelling    SEVERITY OF SWELLING NOT DEFINED.  Marland Kitchen Bextra [Valdecoxib] Other (See Comments)    Stomach Pains  . Motrin [Ibuprofen] Other (See Comments)    Stomach pain    HOME MEDICATIONS: Outpatient Medications Prior to Visit  Medication Sig Dispense Refill  . anastrozole (ARIMIDEX) 1 MG tablet TAKE ONE TABLET BY MOUTH ONCE DAILY. 90 tablet 0  . aspirin EC 81 MG tablet Take 81 mg by mouth daily.    . BELSOMRA 5 MG TABS Take 1 tablet by mouth daily.  1  . Calcium Carbonate-Vitamin D (CALCIUM 600+D3 PO) Take 1 tablet by mouth 2 (two) times daily.    . carbamide peroxide (DEBROX) 6.5 % otic solution Place 5 drops into both ears daily as needed (ear  wax buildup).     . cycloSPORINE (RESTASIS) 0.05 % ophthalmic emulsion Place 1 drop into both eyes 2 (two) times daily.    . felodipine (PLENDIL) 5 MG 24 hr tablet Take 5 mg by mouth daily. Takes in the afternoon around 1500    . fluticasone (FLONASE) 50 MCG/ACT nasal spray Place 2 sprays into the nose daily as needed for allergies.    . hydrALAZINE (APRESOLINE) 50 MG tablet Take 1 tablet (50 mg total) by mouth 3 (three) times daily. 90 tablet 1  . Liniments (SALONPAS PAIN RELIEF PATCH) PADS Apply 1 each topically daily as needed (pain).    Marland Kitchen losartan (COZAAR) 100 MG tablet Take 100 mg by mouth daily.    . non-metallic deodorant Jethro Poling) MISC Apply 1 application topically daily.     . Omega-3 Fatty Acids (FISH OIL TRIPLE STRENGTH) 1400 MG CAPS Take 1 tablet by mouth daily.    Vladimir Faster Glycol-Propyl Glycol (SYSTANE) 0.4-0.3 % SOLN Apply 1 drop to eye 2 (two) times daily.    . potassium chloride SA (K-DUR,KLOR-CON) 20 MEQ tablet Take 1 tablet (20 mEq total) by mouth daily. 3 tablet 0  . sertraline (ZOLOFT) 100 MG tablet Take 1 tablet by mouth daily.  0   . tretinoin (RETIN-A) 0.025 % cream Apply 1 application topically at bedtime.   3   No facility-administered medications prior to visit.     PAST MEDICAL HISTORY: Past Medical History:  Diagnosis Date  . Anxiety   . Arthritis   . Cancer [facility])    breast cancer  . Depression   . Fatigue   . History of hyperthyroidism    resolved, no medicaions for treatment at this time  . Hypertension   . Insomnia   . OSA (obstructive sleep apnea)   . Pulmonary hypertension ([place])     PAST SURGICAL HISTORY: Past Surgical History:  Procedure Laterality Date  . BREAST LUMPECTOMY     right side  . BREAST LUMPECTOMY WITH AXILLARY LYMPH NODE BIOPSY Left 06/17/2016   Procedure: BREAST LUMPECTOMY WITH AXILLARY LYMPH NODE BIOPSY;  Surgeon: Autumn Messing III, MD;  Location: [facility];  Service: General;  Laterality: Left;  . COLONOSCOPY    . DILATION AND CURETTAGE OF UTERUS    . EYE SURGERY     bilateral cataracts removed  . FOOT SURGERY    . PORTACATH PLACEMENT Right 07/16/2016   Procedure: INSERTION PORT-A-CATH RIGHT SUBLCLAVIAN;  Surgeon: Autumn Messing III, MD;  Location: [facility];  Service: General;  Laterality: Right;  . TONSILLECTOMY      FAMILY HISTORY: Family History  Problem Relation Age of Onset  . Heart attack Mother   . Cancer Mother   . Hypertension Other   . Lung disease Neg Hx   . Rheumatologic disease Neg Hx     SOCIAL HISTORY: Social History   Social History  . Marital status: Married    Spouse name: N/A  . Number of children: N/A  . Years of education: N/A   Occupational History  . Not on file.   Social History Main Topics  . Smoking status: Passive Smoke Exposure - Never Smoker  . Smokeless tobacco: Never Used     Comment: Husband smoked  . Alcohol use No  . Drug use: No  . Sexual activity: Not Currently   Other Topics Concern  . 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 Originally from [place]. Lived for years in [place]. Moved back to [place] in 1991. Has also lived in [place]. No pets currently. No bird or mold exposure.     PHYSICAL EXAM  Vitals:   09/15/17 1052  BP: (!) 180/80   Pulse: 73  Weight: 123 lb 3.2 oz (55.9 kg)   Body mass index is 22.53 kg/m.  Generalized: Well developed, in no acute distress  Head: normocephalic and atraumatic,. Oropharynx benign  Neck: Supple,   Musculoskeletal: No deformity   Neurological examination   Mentation: Alert oriented to time, place, history taking. Attention span and concentration appropriate. Recent and remote memory intact.  Follows all commands speech and language fluent. ESS 3  Cranial nerve II-XII: Pupils were equal round reactive to light extraocular movements were full, visual field were full on confrontational test. Facial sensation and strength were normal. hearing was intact to finger rubbing bilaterally. Uvula tongue midline. head turning and shoulder shrug were normal and symmetric.Tongue protrusion into cheek strength was normal. Motor: normal bulk and tone, full strength in the BUE, BLE,  Sensory: normal and symmetric to light touch, on the face arms and legs Coordination: finger-nose-finger, heel-to-shin bilaterally, no dysmetria Reflexes: Symmetric upper and lower plantar responses were flexor bilaterally. Gait and Station: Rising up from seated position without assistance, normal stance,  moderate stride, good arm swing, smooth turning, able to perform tiptoe, and heel walking without difficulty. Tandem gait is steady. No assistive device  DIAGNOSTIC DATA (LABS, IMAGING, TESTING) - I reviewed patient records, labs, notes, testing and imaging myself where available.  Lab Results  Component Value Date   WBC 6.6 09/09/2017   HGB 12.1 09/09/2017   HCT 36.6 09/09/2017   MCV 81.5 09/09/2017   PLT 328 09/09/2017      Component Value  Date/Time   NA 135 09/09/2017 0810   NA 133 (L) 01/05/2017 1334   K 3.1 (L) 09/09/2017 0810   K 4.0 01/05/2017 1334   CL 97 (L) 09/09/2017 0810   CO2 25 09/09/2017 0810   CO2 29 01/05/2017 1334   GLUCOSE 110 (H) 09/09/2017 0810   GLUCOSE 90 01/05/2017 1334   BUN 16 09/09/2017 0810   BUN 17.1 01/05/2017 1334   CREATININE 0.90 09/09/2017 0810   CREATININE 1.0 01/05/2017 1334   CALCIUM 9.6 09/09/2017 0810   CALCIUM 10.2 01/05/2017 1334   PROT 7.9 09/09/2017 0810   PROT 7.6 01/05/2017 1334   ALBUMIN 4.0 09/09/2017 0810   ALBUMIN 3.7 01/05/2017 1334   AST 22 09/09/2017 0810   AST 19 01/05/2017 1334   ALT 17 09/09/2017 0810   ALT 15 01/05/2017 1334   ALKPHOS 61 09/09/2017 0810   ALKPHOS 69 01/05/2017 1334   BILITOT 0.7 09/09/2017 0810   BILITOT 0.34 01/05/2017 1334   GFRNONAA 56 (L) 09/09/2017 0810   GFRAA >60 09/09/2017 0810    ASSESSMENT AND PLAN Mrs. Poulter 83 year old has  sleep apnea which has been well treated on CPAP titration with AutoSet between 6 and 12 cm water.Compliance data dated 08/15/2017 - 9 /24/ 2018 shows 100% compliance greater than 4 hours for 30 days. Average usage 8 hours 6 minutes. BiPAP pressure 6-12 cm. EPR level 2. AHI 1.    Plan: CPAP compliance excellent results explained to patient Continue Zoloft for anxiety Follow up yearly I spent 15 minutes in total face to face time with the patient more than 50% of which was spent counseling and coordination  of care, reviewing test results reviewing medications and discussing and reviewing the diagnosis of obstructive sleep apnea and sleep Compliance with CPAP Dennie Bible, [facility], [facility], APRN  [facility] [address] [place], [place] [address] [phone]

## 2017-09-15 ENCOUNTER — Encounter: Payer: Self-pay | Admitting: Nurse Practitioner

## 2017-09-15 ENCOUNTER — Ambulatory Visit (INDEPENDENT_AMBULATORY_CARE_PROVIDER_SITE_OTHER): Payer: Medicare Other | Admitting: Nurse Practitioner

## 2017-09-15 VITALS — BP 180/80 | HR 73 | Wt 123.2 lb

## 2017-09-15 DIAGNOSIS — Z8679 Personal history of other diseases of the circulatory system: Secondary | ICD-10-CM | POA: Diagnosis not present

## 2017-09-15 DIAGNOSIS — G4733 Obstructive sleep apnea (adult) (pediatric): Secondary | ICD-10-CM | POA: Diagnosis not present

## 2017-09-15 NOTE — Patient Instructions (Signed)
CPAP compliance excellent Follow up yearly

## 2017-09-15 NOTE — Progress Notes (Signed)
I agree with the assessment and plan as directed by NP .The patient is known to me .   Yancy Hascall, MD  

## 2017-09-23 DIAGNOSIS — F411 Generalized anxiety disorder: Secondary | ICD-10-CM | POA: Diagnosis not present

## 2017-09-23 DIAGNOSIS — R7309 Other abnormal glucose: Secondary | ICD-10-CM | POA: Diagnosis not present

## 2017-09-23 DIAGNOSIS — F339 Major depressive disorder, recurrent, unspecified: Secondary | ICD-10-CM | POA: Diagnosis not present

## 2017-09-23 DIAGNOSIS — R42 Dizziness and giddiness: Secondary | ICD-10-CM | POA: Diagnosis not present

## 2017-09-23 DIAGNOSIS — R634 Abnormal weight loss: Secondary | ICD-10-CM | POA: Diagnosis not present

## 2017-09-23 DIAGNOSIS — G4733 Obstructive sleep apnea (adult) (pediatric): Secondary | ICD-10-CM | POA: Diagnosis not present

## 2017-09-23 DIAGNOSIS — I10 Essential (primary) hypertension: Secondary | ICD-10-CM | POA: Diagnosis not present

## 2017-09-23 DIAGNOSIS — M609 Myositis, unspecified: Secondary | ICD-10-CM | POA: Diagnosis not present

## 2017-09-23 DIAGNOSIS — E559 Vitamin D deficiency, unspecified: Secondary | ICD-10-CM | POA: Diagnosis not present

## 2017-09-23 DIAGNOSIS — R300 Dysuria: Secondary | ICD-10-CM | POA: Diagnosis not present

## 2017-09-23 DIAGNOSIS — R8290 Unspecified abnormal findings in urine: Secondary | ICD-10-CM | POA: Diagnosis not present

## 2017-09-23 DIAGNOSIS — I272 Pulmonary hypertension, unspecified: Secondary | ICD-10-CM | POA: Diagnosis not present

## 2017-09-23 DIAGNOSIS — Z23 Encounter for immunization: Secondary | ICD-10-CM | POA: Diagnosis not present

## 2017-09-27 ENCOUNTER — Telehealth: Payer: Self-pay | Admitting: Cardiology

## 2017-09-27 NOTE — Telephone Encounter (Signed)
Pt would like to speak w/ someone about her hydrALAZINE (APRESOLINE) 50 MG tablet [[id number]]  States it's making her weak and tired.

## 2017-09-28 ENCOUNTER — Telehealth: Payer: Self-pay | Admitting: Hematology and Oncology

## 2017-09-28 ENCOUNTER — Ambulatory Visit (HOSPITAL_BASED_OUTPATIENT_CLINIC_OR_DEPARTMENT_OTHER): Payer: Medicare Other | Admitting: Hematology and Oncology

## 2017-09-28 DIAGNOSIS — Z170 Estrogen receptor positive status [ER+]: Secondary | ICD-10-CM

## 2017-09-28 DIAGNOSIS — C50212 Malignant neoplasm of upper-inner quadrant of left female breast: Secondary | ICD-10-CM

## 2017-09-28 DIAGNOSIS — F419 Anxiety disorder, unspecified: Secondary | ICD-10-CM | POA: Diagnosis not present

## 2017-09-28 DIAGNOSIS — Z79811 Long term (current) use of aromatase inhibitors: Secondary | ICD-10-CM | POA: Diagnosis not present

## 2017-09-28 MED ORDER — ANASTROZOLE 1 MG PO TABS: 0.5000 mg | ORAL_TABLET | Freq: Every day | ORAL | 1 refills | Status: DC

## 2017-09-28 NOTE — Progress Notes (Signed)
Patient Care Team: Rosita Fire, MD as PCP - General (Internal Medicine) Nicholas Lose, MD as Consulting Physician (Hematology and Oncology) Kyung Rudd, MD as Consulting Physician (Radiation Oncology) Jovita Kussmaul, MD as Consulting Physician (General Surgery) Delice Bison Charlestine Massed, NP as Nurse Practitioner (Hematology and Oncology)  DIAGNOSIS:  Encounter Diagnosis  Name Primary?  . Malignant neoplasm of upper-inner quadrant of left breast in female, estrogen receptor positive ([place])     SUMMARY OF ONCOLOGIC HISTORY:   Malignant neoplasm of upper-inner quadrant of left female breast ([place])   06/02/2016 Initial Diagnosis    Left breast biopsy 11:00 position: IDC with DCIS, ER 60%, PR 0%, Ki-67 15%, HER-2 negative ratio 1.4 to      06/17/2016 Surgery    Left lumpectomy: IDC grade 2, 2.4 cm, with intermediate grade DCIS, LVID present, margins negative, 0/4 lymph nodes positive, ER 60%, PR 0%, HER-2 positive ratio 2.21, Ki-67 15%, T2 N0 stage II a      07/17/2016 - 08/14/2016 Radiation Therapy    Adjuvant radiation therapy      07/21/2016 - 11/06/2016 Chemotherapy    Herceptin every 3 weeks 1 year along with anastrozole      12/09/2016 - 12/12/2016 Hospital Admission    Shortness of breath possibly pneumonia vs CHF       CHIEF COMPLIANT: Follow-up on anastrozole therapy  INTERVAL HISTORY: Charlotte [name] is a 83 year old with above-mentioned history of left breast cancer who is currently on half a tablet of anastrozole therapy for breast cancer risk reduction. She continues to have trouble with her blood pressure. Her blood pressures been in the 200s and has been taking hydralazine. In spite of that her blood pressures remain high. She is complaining of frequent urination that she is experiencing because of the hydralazine. She has appointments to see a cardiologist to discuss this issue further. Because of the hydralazine induced urination, she feels weak and fatigued. She  tells me that when we reduced the dosage of anastrozole from 1 tablet to half a tablet [name] fact felt a lot better.  REVIEW OF SYSTEMS:   Constitutional: Denies fevers, chills or abnormal weight loss Eyes: Denies blurriness of vision Ears, nose, mouth, throat, and face: Denies mucositis or sore throat Respiratory: Denies cough, dyspnea or wheezes Cardiovascular: Denies palpitation, chest discomfort Gastrointestinal:  Denies nausea, heartburn or change in bowel habits Skin: Denies abnormal skin rashes Lymphatics: Denies new lymphadenopathy or easy bruising Neurological:Denies numbness, tingling or new weaknesses Behavioral/Psych: Mood is stable, no new changes  Extremities: No lower extremity edema Breast:  denies any pain or lumps or nodules in either breasts All other systems were reviewed with the patient and are negative.  I have reviewed the past medical history, past surgical history, social history and family history with the patient and they are unchanged from previous note.  ALLERGIES:  is allergic to latex; bextra [valdecoxib]; and motrin [ibuprofen].  MEDICATIONS:  Current Outpatient Prescriptions  Medication Sig Dispense Refill  . anastrozole (ARIMIDEX) 1 MG tablet Take 0.5 tablets (0.5 mg total) by mouth daily. 90 tablet 1  . aspirin EC 81 MG tablet Take 81 mg by mouth daily.    . BELSOMRA 5 MG TABS Take 1 tablet by mouth daily.  1  . Calcium Carbonate-Vitamin D (CALCIUM 600+D3 PO) Take 1 tablet by mouth 2 (two) times daily.    . carbamide peroxide (DEBROX) 6.5 % otic solution Place 5 drops into both ears daily as needed (ear wax buildup).     Marland Kitchen  cycloSPORINE (RESTASIS) 0.05 % ophthalmic emulsion Place 1 drop into both eyes 2 (two) times daily.    . felodipine (PLENDIL) 5 MG 24 hr tablet Take 5 mg by mouth daily. Takes in the afternoon around 1500    . fluticasone (FLONASE) 50 MCG/ACT nasal spray Place 2 sprays into the nose daily as needed for allergies.    . hydrALAZINE  (APRESOLINE) 50 MG tablet Take 1 tablet (50 mg total) by mouth 3 (three) times daily. 90 tablet 1  . Liniments (SALONPAS PAIN RELIEF PATCH) PADS Apply 1 each topically daily as needed (pain).    Marland Kitchen losartan (COZAAR) 100 MG tablet Take 100 mg by mouth daily.    . non-metallic deodorant Jethro Poling) MISC Apply 1 application topically daily.     . Omega-3 Fatty Acids (FISH OIL TRIPLE STRENGTH) 1400 MG CAPS Take 1 tablet by mouth daily.    Vladimir Faster Glycol-Propyl Glycol (SYSTANE) 0.4-0.3 % SOLN Apply 1 drop to eye 2 (two) times daily.    . potassium chloride SA (K-DUR,KLOR-CON) 20 MEQ tablet Take 1 tablet (20 mEq total) by mouth daily. 3 tablet 0  . sertraline (ZOLOFT) 100 MG tablet Take 1 tablet by mouth daily.  0  . tretinoin (RETIN-A) 0.025 % cream Apply 1 application topically at bedtime.   3   No current facility-administered medications for this visit.     PHYSICAL EXAMINATION: ECOG PERFORMANCE STATUS: 2 - Symptomatic, <50% confined to bed  Vitals:   09/28/17 1410  BP: (!) 189/60  Pulse: 75  Resp: 18  Temp: 98.5 F (36.9 C)  SpO2: 98%   Filed Weights   09/28/17 1410  Weight: 125 lb 1.6 oz (56.7 kg)    GENERAL:alert, no distress and comfortable SKIN: skin color, texture, turgor are normal, no rashes or significant lesions EYES: normal, Conjunctiva are pink and non-injected, sclera clear OROPHARYNX:no exudate, no erythema and lips, buccal mucosa, and tongue normal  NECK: supple, thyroid normal size, non-tender, without nodularity LYMPH:  no palpable lymphadenopathy in the cervical, axillary or inguinal LUNGS: clear to auscultation and percussion with normal breathing effort HEART: regular rate & rhythm  and no lower extremity edema, Prominent ejection systolic murmur in the mitral area ABDOMEN:abdomen soft, non-tender and normal bowel sounds MUSCULOSKELETAL:no cyanosis of digits and no clubbing  NEURO: alert & oriented x 3 with fluent speech, no focal motor/sensory  deficits EXTREMITIES: No lower extremity edema BREAST: No palpable masses or nodules in either right or left breasts. No palpable axillary supraclavicular or infraclavicular adenopathy no breast tenderness or nipple discharge. (exam performed in the presence of a chaperone)  LABORATORY DATA:  I have reviewed the data as listed   Chemistry      Component Value Date/Time   NA 135 09/09/2017 0810   NA 133 (L) 01/05/2017 1334   K 3.1 (L) 09/09/2017 0810   K 4.0 01/05/2017 1334   CL 97 (L) 09/09/2017 0810   CO2 25 09/09/2017 0810   CO2 29 01/05/2017 1334   BUN 16 09/09/2017 0810   BUN 17.1 01/05/2017 1334   CREATININE 0.90 09/09/2017 0810   CREATININE 1.0 01/05/2017 1334      Component Value Date/Time   CALCIUM 9.6 09/09/2017 0810   CALCIUM 10.2 01/05/2017 1334   ALKPHOS 61 09/09/2017 0810   ALKPHOS 69 01/05/2017 1334   AST 22 09/09/2017 0810   AST 19 01/05/2017 1334   ALT 17 09/09/2017 0810   ALT 15 01/05/2017 1334   BILITOT 0.7 09/09/2017 0810  BILITOT 0.34 01/05/2017 1334       Lab Results  Component Value Date   WBC 6.6 09/09/2017   HGB 12.1 09/09/2017   HCT 36.6 09/09/2017   MCV 81.5 09/09/2017   PLT 328 09/09/2017   NEUTROABS 3.7 09/09/2017    ASSESSMENT & PLAN:  Malignant neoplasm of upper-inner quadrant of left female breast ([place]) Left lumpectomy 06/17/2016: IDC grade 2, 2.4 cm, with intermediate grade DCIS, LVID present, margins negative, 0/4 lymph nodes positive, ER 60%, PR 0%, HER-2 negative ratio 1.42, Ki-67 15%, T2 N0 stage II a Adjuvant radiation therapy started 07/17/2016 completed 08/14/2016  Treatment plan:  1. Adjuvant Herceptin every 3 weeks started 07/21/2016 discontinued on 11/03/2016 due to CHF. 2. Anastrozole started 09/01/2016 Patient's cardiologist Dr. Carlyle Dolly. Hospitalization Dec 2017 for SOB ? CHF vs Pneumonia; because of this we discontinued Herceptin  11/03/2016 -------------------------------------------------------------------------------------------------------------------------------- Current treatment: anastrozole 1 mg daily started 10/13/2016, Reduced dosage to half a tablet daily Monitoringclosely for toxicities.  Anastrozole toxicities: Denies any hot flashes or myalgias.  Breast cancer surveillance: 1. Breast exam done 09/28/2017: Benign 2. mammogram 06/15/2017: Benign  Anxiety disorder: Currently on Zoloft.  Return to clinic in 1 year for follow-up   I spent 25 minutes talking to the patient of which more than half was spent in counseling and coordination of care.  No orders of the defined types were placed in this encounter.  The patient has a good understanding of the overall plan. she agrees with it. she will call with any problems that may develop before the next visit here.   Rulon Eisenmenger, MD 09/28/17

## 2017-09-28 NOTE — Telephone Encounter (Signed)
Scheduled appts per 10/9 los - gave patient a calendar at check out.

## 2017-09-28 NOTE — Telephone Encounter (Signed)
Patient has apt next week with Dr.Branch and will discuss with him

## 2017-09-28 NOTE — Assessment & Plan Note (Addendum)
Left lumpectomy 06/17/2016: IDC grade 2, 2.4 cm, with intermediate grade DCIS, LVID present, margins negative, 0/4 lymph nodes positive, ER 60%, PR 0%, HER-2 negative ratio 1.42, Ki-67 15%, T2 N0 stage II a Adjuvant radiation therapy started 07/17/2016 completed 08/14/2016  Treatment plan:  1. Adjuvant Herceptin every 3 weeks started 07/21/2016 discontinued on 11/03/2016 due to CHF. 2. Anastrozole started 09/01/2016 Patient's cardiologist Dr. Carlyle Dolly. Hospitalization Dec 2017 for SOB ? CHF vs Pneumonia; because of this we discontinued Herceptin 11/03/2016 -------------------------------------------------------------------------------------------------------------------------------- Current treatment: anastrozole 1 mg daily started 10/13/2016 Monitoringclosely for toxicities.  Anastrozole toxicities: Denies any hot flashes or myalgias.  Breast cancer surveillance: 1. Breast exam done 09/28/2017: Benign 2. mammogram 06/15/2017: Benign  Anxiety disorder: Currently on Zoloft.  Return to clinic in 1 year for follow-up

## 2017-10-06 ENCOUNTER — Encounter: Payer: Self-pay | Admitting: Cardiology

## 2017-10-06 ENCOUNTER — Ambulatory Visit (INDEPENDENT_AMBULATORY_CARE_PROVIDER_SITE_OTHER): Payer: Medicare Other | Admitting: Cardiology

## 2017-10-06 VITALS — BP 190/68 | HR 69 | Ht 62.0 in | Wt 126.8 lb

## 2017-10-06 DIAGNOSIS — I427 Cardiomyopathy due to drug and external agent: Secondary | ICD-10-CM | POA: Diagnosis not present

## 2017-10-06 DIAGNOSIS — I272 Pulmonary hypertension, unspecified: Secondary | ICD-10-CM | POA: Diagnosis not present

## 2017-10-06 DIAGNOSIS — I10 Essential (primary) hypertension: Secondary | ICD-10-CM

## 2017-10-06 DIAGNOSIS — Z79899 Other long term (current) drug therapy: Secondary | ICD-10-CM

## 2017-10-06 MED ORDER — FELODIPINE ER 10 MG PO TB24: 10.0000 mg | ORAL_TABLET | Freq: Every day | ORAL | 3 refills | Status: AC

## 2017-10-06 MED ORDER — HYDRALAZINE HCL 25 MG PO TABS: 25.0000 mg | ORAL_TABLET | Freq: Three times a day (TID) | ORAL | 3 refills | Status: AC

## 2017-10-06 MED ORDER — SPIRONOLACTONE 25 MG PO TABS
25.0000 mg | ORAL_TABLET | Freq: Every day | ORAL | 3 refills | Status: DC
Start: 2017-10-06 — End: 2017-10-27

## 2017-10-06 NOTE — Patient Instructions (Signed)
Medication Instructions:  INCREASE FELODIPINE 10 MG DAILY  START ALDACTONE 25 MG DAILY   DECREASE HYDRALAZINE to 25 MG - THREE TIMERS DAILY   Labwork: 2 WEEKS  BMET  Testing/Procedures: NONE  Follow-Up: Your physician recommends that you schedule a follow-up appointment in: 3 MONTHS    Any Other Special Instructions Will Be Listed Below (If Applicable). Your physician has requested that you regularly monitor and record your blood pressure readings at home. Please use the same machine at the same time of day to check your readings and record them to bring to your follow-up visit. - 2 WEEK BLOOD PRESSURE LOG_   If you need a refill on your cardiac medications before your next appointment, please call your pharmacy.

## 2017-10-06 NOTE — Progress Notes (Signed)
Clinical Summary Charlotte [name] is a 83 y.o.female seen today for follow up of the following medical problems.    1. Decrease in LVsystolic dysfunction - echo 10/2016 LVEF 50%, down from 60-65%. Grade II diastolic dysfunction. PASP 64.  - hereceptin started 07/21/16, anastrazole started 09/01/16.   - herceptin has been stopped - denies any recent SOB/DOE/LE edema. She has chronic fatigue she reports is worst since starting hydralazine.     2. Breast cancer - history of lumpectomy 05/2016 and radiation (radiation completed 07/2016), currently on herceptin maintenance. Recently started on anastrazole.  - hereceptin started 07/21/16, anastrazole started 09/01/16.  - now off hereptin  3. Pulmonary HTN - PASP above 50 on echos since 07/2015 - likely multifactorial. She has grade II diastolic dysfunction, OSA.  - followed by pulmonary   4. OSA - on cpap  5. HTN - fatigue with beta blockers  - reports fatigue on hydralazine, with lowered doses not improved  Past Medical History:  Diagnosis Date  . Anxiety   . Arthritis   . Cancer [facility])    breast cancer  . Depression   . Fatigue   . History of hyperthyroidism    resolved, no medicaions for treatment at this time  . Hypertension   . Insomnia   . OSA (obstructive sleep apnea)   . Pulmonary hypertension (HCC)      Allergies  Allergen Reactions  . Latex Swelling    SEVERITY OF SWELLING NOT DEFINED.  Marland Kitchen Bextra [Valdecoxib] Other (See Comments)    Stomach Pains  . Motrin [Ibuprofen] Other (See Comments)    Stomach pain     Current Outpatient Prescriptions  Medication Sig Dispense Refill  . anastrozole (ARIMIDEX) 1 MG tablet Take 0.5 tablets (0.5 mg total) by mouth daily. 90 tablet 1  . aspirin EC 81 MG tablet Take 81 mg by mouth daily.    . BELSOMRA 5 MG TABS Take 1 tablet by mouth daily.  1  . Calcium Carbonate-Vitamin D (CALCIUM 600+D3 PO) Take 1 tablet by mouth 2 (two) times daily.    . carbamide peroxide  (DEBROX) 6.5 % otic solution Place 5 drops into both ears daily as needed (ear wax buildup).     . cycloSPORINE (RESTASIS) 0.05 % ophthalmic emulsion Place 1 drop into both eyes 2 (two) times daily.    . felodipine (PLENDIL) 5 MG 24 hr tablet Take 5 mg by mouth daily. Takes in the afternoon around 1500    . fluticasone (FLONASE) 50 MCG/ACT nasal spray Place 2 sprays into the nose daily as needed for allergies.    . hydrALAZINE (APRESOLINE) 50 MG tablet Take 1 tablet (50 mg total) by mouth 3 (three) times daily. 90 tablet 1  . Liniments (SALONPAS PAIN RELIEF PATCH) PADS Apply 1 each topically daily as needed (pain).    Marland Kitchen losartan (COZAAR) 100 MG tablet Take 100 mg by mouth daily.    . non-metallic deodorant Jethro Poling) MISC Apply 1 application topically daily.     . Omega-3 Fatty Acids (FISH OIL TRIPLE STRENGTH) 1400 MG CAPS Take 1 tablet by mouth daily.    Vladimir Faster Glycol-Propyl Glycol (SYSTANE) 0.4-0.3 % SOLN Apply 1 drop to eye 2 (two) times daily.    . potassium chloride SA (K-DUR,KLOR-CON) 20 MEQ tablet Take 1 tablet (20 mEq total) by mouth daily. 3 tablet 0  . sertraline (ZOLOFT) 100 MG tablet Take 1 tablet by mouth daily.  0  . tretinoin (RETIN-A) 0.025 % cream Apply  1 application topically at bedtime.   3   No current facility-administered medications for this visit.      Past Surgical History:  Procedure Laterality Date  . BREAST LUMPECTOMY     right side  . BREAST LUMPECTOMY WITH AXILLARY LYMPH NODE BIOPSY Left 06/17/2016   Procedure: BREAST LUMPECTOMY WITH AXILLARY LYMPH NODE BIOPSY;  Surgeon: Autumn Messing III, MD;  Location: [facility];  Service: General;  Laterality: Left;  . COLONOSCOPY    . DILATION AND CURETTAGE OF UTERUS    . EYE SURGERY     bilateral cataracts removed  . FOOT SURGERY    . PORTACATH PLACEMENT Right 07/16/2016   Procedure: INSERTION PORT-A-CATH RIGHT SUBLCLAVIAN;  Surgeon: Autumn Messing III, MD;  Location: [facility];  Service: General;  Laterality: Right;  . TONSILLECTOMY        Allergies  Allergen Reactions  . Latex Swelling    SEVERITY OF SWELLING NOT DEFINED.  Marland Kitchen Bextra [Valdecoxib] Other (See Comments)    Stomach Pains  . Motrin [Ibuprofen] Other (See Comments)    Stomach pain      Family History  Problem Relation Age of Onset  . Heart attack Mother   . Cancer Mother   . Hypertension Other   . Lung disease Neg Hx   . Rheumatologic disease Neg Hx      Social History Charlotte [name] reports that she is a non-smoker but has been exposed to tobacco smoke. She has never used smokeless tobacco. Charlotte [name] reports that she does not drink alcohol.   Review of Systems CONSTITUTIONAL: +fatigue  HEENT: Eyes: No visual loss, blurred vision, double vision or yellow sclerae.No hearing loss, sneezing, congestion, runny nose or sore throat.  SKIN: No rash or itching.  CARDIOVASCULAR: per hpi RESPIRATORY: No shortness of breath, cough or sputum.  GASTROINTESTINAL: No anorexia, nausea, vomiting or diarrhea. No abdominal pain or blood.  GENITOURINARY: No burning on urination, no polyuria NEUROLOGICAL: No headache, dizziness, syncope, paralysis, ataxia, numbness or tingling in the extremities. No change in bowel or bladder control.  MUSCULOSKELETAL: No muscle, back pain, joint pain or stiffness.  LYMPHATICS: No enlarged nodes. No history of splenectomy.  PSYCHIATRIC: No history of depression or anxiety.  ENDOCRINOLOGIC: No reports of sweating, cold or heat intolerance. No polyuria or polydipsia.  Marland Kitchen   Physical Examination Vitals:   10/06/17 1434 10/06/17 1445  BP: (!) 190/78 (!) 190/68  Pulse: 71 69  SpO2: 96% 98%   Vitals:   10/06/17 1434  Weight: 126 lb 12.8 oz (57.5 kg)  Height: 5\' 2"  (1.575 m)    Gen: resting comfortably, no acute distress HEENT: no scleral icterus, pupils equal round and reactive, no palptable cervical adenopathy,  CV: RRR, no m/r/g, no jvd Resp: Clear to auscultation bilaterally GI: abdomen is soft, non-tender,  non-distended, normal bowel sounds, no hepatosplenomegaly MSK: extremities are warm, no edema.  Skin: warm, no rash Neuro:  no focal deficits Psych: appropriate affect   Diagnostic Studies 07/2015 echo Study Conclusions  - Left ventricle: The cavity size was normal. Wall thickness was normal. Systolic function was normal. The estimated ejection fraction was in the range of 55% to 60%. Wall motion was normal; there were no regional wall motion abnormalities. Doppler parameters are consistent with abnormal left ventricular relaxation (grade 1 diastolic dysfunction). There is evidence of elevated LA pressure. - Aortic valve: Mildly calcified annulus. Trileaflet; mildly thickened leaflets. Valve area (VTI): 1.78 cm^2. Valve area (Vmax): 1.91 cm^2. - Mitral valve: Mildly calcified  annulus. Normal thickness leaflets . There was mild regurgitation. - Left atrium: The atrium was moderately dilated. - Atrial septum: No defect or patent foramen ovale was identified. - Pulmonary arteries: Systolic pressure was moderately increased. PA peak pressure: 49 mm Hg (S). - Technically adequate study.  10/2015 CT PE IMPRESSION: 1. No acute findings identified. No evidence for acute pulmonary embolus. 2. Aortic atherosclerosis and LAD coronary artery calcification.   06/2016 echo Study Conclusions  - Left ventricle: The cavity size was normal. Wall thickness was normal. Systolic function was normal. The estimated ejection fraction was in the range of 60% to 65%. Wall motion was normal; there were no regional wall motion abnormalities. Doppler parameters are consistent with abnormal left ventricular relaxation (grade 1 diastolic dysfunction). Doppler parameters are consistent with high ventricular filling pressure. - Aortic valve: Valve area (VTI): 2.41 cm^2. Valve area (Vmax): 2.51 cm^2. Valve area (Vmean): 2.45 cm^2. - Mitral valve: There was mild  regurgitation. - Left atrium: The atrium was moderately dilated. - Right atrium: The atrium was moderately dilated. - Pulmonary arteries: Systolic pressure was moderately increased. PA peak pressure: 51 mm Hg (S). - Technically adequate study.   10/2016 echo Study Conclusions  - Left ventricle: The cavity size was normal. Wall thickness was increased in a pattern of moderate LVH. Systolic function was mildly reduced. The estimated ejection fraction was 50%. LVEF calculated at 46% by speckle tracking. Reduced global longitudinal strain of -13.8%. Features are consistent with a pseudonormal left ventricular filling pattern, with concomitant abnormal relaxation and increased filling pressure (grade 2 diastolic dysfunction). - Aortic valve: Mildly calcified annulus. Trileaflet; mildly thickened leaflets. There was trivial regurgitation. - Mitral valve: There was mild regurgitation. - Left atrium: The atrium was moderately dilated. - Right atrium: Central venous pressure (est): 3 mm Hg. - Atrial septum: No defect or patent foramen ovale was identified. - Tricuspid valve: There was mild regurgitation. - Pulmonary arteries: Systolic pressure was severely increased. PA peak pressure: 64 mm Hg (S). - Pericardium, extracardiac: There was no pericardial effusion.   01/2017 echo Study Conclusions  - Left ventricle: Apical windows are difficult. Some foreshortening   makes evaluation of LVEF difficult LVEF is probably normal to   mildly depressed   Would recomm ordering limited echo with Definity to further   define. The cavity size was normal. Wall thickness was normal.   Doppler parameters are consistent with abnormal left ventricular   relaxation (grade 1 diastolic dysfunction). - Aortic valve: There was trivial regurgitation. - Mitral valve: There was mild regurgitation. - Left atrium: The atrium was moderately dilated. - Pulmonary arteries: PA peak  pressure: 46 mm Hg (S).   Assessment and Plan  1. 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 Recent echo shows drop in LVEF from 60-65% to 50%, though still within the low normal range of function this drop correltes with her starting herceptin. Now off herceptin - appears euvolemic. Chronic generalized fatigue, no specific cardiopulmonary symptoms - continue current meds   2. Pulmonary HTN - likely combined left sided heart disease with her grade II diastolic dysfunction, combined with OSA and posible underlying lung disease -continue management of underlying conditions, no indication for pulmonary vasodilators.   3. HTN - elevated in clinic - side effects to beta blockers. Not tolerating higher dose hydralazine. We will lower to 25mg  tid, start aldactone 25mg  daily. Check BMET in 2 weeks. Submit bp log in 2 weeks.  - increase felodopine to  10mg       Arnoldo Lenis, M.D.

## 2017-10-20 DIAGNOSIS — Z79899 Other long term (current) drug therapy: Secondary | ICD-10-CM | POA: Diagnosis not present

## 2017-10-20 LAB — BASIC METABOLIC PANEL
BUN / CREAT RATIO: 24 (calc) — AB (ref 6–22)
BUN: 22 mg/dL (ref 7–25)
CHLORIDE: 97 mmol/L — AB (ref 98–110)
CO2: 28 mmol/L (ref 20–32)
Calcium: 9.9 mg/dL (ref 8.6–10.4)
Creat: 0.91 mg/dL — ABNORMAL HIGH (ref 0.60–0.88)
Glucose, Bld: 89 mg/dL (ref 65–139)
Potassium: 5 mmol/L (ref 3.5–5.3)
SODIUM: 133 mmol/L — AB (ref 135–146)

## 2017-10-22 DIAGNOSIS — C50212 Malignant neoplasm of upper-inner quadrant of left female breast: Secondary | ICD-10-CM | POA: Diagnosis not present

## 2017-10-27 ENCOUNTER — Telehealth: Payer: Self-pay | Admitting: Cardiology

## 2017-10-27 MED ORDER — SPIRONOLACTONE 25 MG PO TABS
12.5000 mg | ORAL_TABLET | Freq: Every day | ORAL | 3 refills | Status: DC
Start: 2017-10-27 — End: 2018-01-18

## 2017-10-27 NOTE — Telephone Encounter (Signed)
Patient c/o feeling fatigue one week after staring spironolactone and pain in her left hip that moves down into her leg. Patient is able to do own ADL's. Patient admits to feeing more fatigue upon waking up and said it improves as she moves around.   Patient said she brought BP readings for review on 10/20/17 and since that time her SBP has been around 117 & DBP 47-56. Patient admits to taking her BP a different time of the day. Patient advised to continue monitoring her BP and that she should take it at the same time each day and as needed for increased fatigue.   No c/o dizziness, chest pain or sob.   Patient advised that spironolactone can cause some fatigue but it usually subsides after several weeks of being on it. Patient advised to contact her PCP about her hip pain and being more fatigue in the mornings to determine if this is of a musculoskeletal nature.    Patient verbalized understanding of plan.

## 2017-10-27 NOTE — Telephone Encounter (Signed)
Patient informed and verbalized understanding of plan. 

## 2017-10-27 NOTE — Telephone Encounter (Signed)
Can lower aldactone to 12.5mg  daily   Zandra Abts MD

## 2017-10-27 NOTE — Telephone Encounter (Signed)
Patient would like to speak with nurse regarding Aldactone. States that she has been having side effects since starting this medicine. / tg

## 2017-11-01 DIAGNOSIS — C50912 Malignant neoplasm of unspecified site of left female breast: Secondary | ICD-10-CM | POA: Diagnosis not present

## 2017-11-01 DIAGNOSIS — I10 Essential (primary) hypertension: Secondary | ICD-10-CM | POA: Diagnosis not present

## 2017-11-01 DIAGNOSIS — M4800 Spinal stenosis, site unspecified: Secondary | ICD-10-CM | POA: Diagnosis not present

## 2017-11-01 DIAGNOSIS — F339 Major depressive disorder, recurrent, unspecified: Secondary | ICD-10-CM | POA: Diagnosis not present

## 2017-11-15 ENCOUNTER — Ambulatory Visit (HOSPITAL_COMMUNITY)
Admission: RE | Admit: 2017-11-15 | Discharge: 2017-11-15 | Disposition: A | Discharge status: Home / Self Care | Payer: Medicare Other | Source: Ambulatory Visit | Attending: Internal Medicine | Admitting: Internal Medicine

## 2017-11-15 ENCOUNTER — Other Ambulatory Visit (HOSPITAL_COMMUNITY): Payer: Self-pay | Admitting: Internal Medicine

## 2017-11-15 DIAGNOSIS — M1612 Unilateral primary osteoarthritis, left hip: Secondary | ICD-10-CM | POA: Insufficient documentation

## 2017-11-15 DIAGNOSIS — M25552 Pain in left hip: Secondary | ICD-10-CM | POA: Diagnosis present

## 2017-12-01 ENCOUNTER — Telehealth: Payer: Self-pay | Admitting: Cardiology

## 2017-12-01 NOTE — Telephone Encounter (Signed)
Would like to speak w/ someone concerning her medications

## 2017-12-01 NOTE — Telephone Encounter (Signed)
Returned pt call. She states that on the 12.5 mg of spironolactone she is still feeling weak. She asks if she could take it every other day instead of daily. I advised her not to change dosage until Dr. Harl Bowie approves it. She states that her blood pressure has been staying pretty good lately. (130's/ 60's) Please advise.

## 2017-12-02 NOTE — Telephone Encounter (Signed)
Every other day is ok for now. Im not convinced the medicine is causing her chronic weakness, but we will see with the change   Zandra Abts MD

## 2017-12-02 NOTE — Telephone Encounter (Signed)
Pt made aware. She voiced understanding.  

## 2017-12-08 ENCOUNTER — Encounter: Payer: Self-pay | Admitting: Orthopaedic Surgery

## 2017-12-08 ENCOUNTER — Ambulatory Visit (INDEPENDENT_AMBULATORY_CARE_PROVIDER_SITE_OTHER): Payer: Medicare Other | Admitting: Orthopaedic Surgery

## 2017-12-08 ENCOUNTER — Ambulatory Visit (INDEPENDENT_AMBULATORY_CARE_PROVIDER_SITE_OTHER): Payer: Medicare Other

## 2017-12-08 VITALS — BP 152/63 | HR 77 | Temp 98.3°F | Ht 63.0 in | Wt 126.0 lb

## 2017-12-08 DIAGNOSIS — M5442 Lumbago with sciatica, left side: Secondary | ICD-10-CM | POA: Diagnosis not present

## 2017-12-08 DIAGNOSIS — G8929 Other chronic pain: Secondary | ICD-10-CM

## 2017-12-08 MED ORDER — PREDNISONE 5 MG (21) PO TBPK: ORAL_TABLET | ORAL | 0 refills | Status: DC

## 2017-12-08 NOTE — Progress Notes (Signed)
Subjective:    Patient ID: Charlotte [name], female    DOB: 1928/06/05, 83 y.o.   MRN: [id number]  HPI She comes in complaining of pain in the left hip.  She was referred by Dr. Legrand Rams.  X-rays done last month showed mild degenerative changes of both hips.  However, on further questioning her and talking with her the pain is of the left lower back, not the hip.  She has long history of lower back pain, spinal stenosis.  She has had MRIs in the past.  She has no trauma.  She has been unable to take NSAIDs as she had reaction to ibuprofen with facial and tongue swelling.    She showed me multiple records about her back, prior xray reports, MRI reports, etc.  She has pain at times to the left lateral foot.  She has no weakness, no bowel or bladder problems.  She had PT in 2017 and has several sheets of exercises to do for her back.  She does these every other day.  She does not want to go back to PT now.     Review of Systems  Respiratory: Positive for shortness of breath. Negative for cough.   Cardiovascular: Negative for chest pain and leg swelling.  Endocrine: Positive for cold intolerance.  Musculoskeletal: Positive for arthralgias, back pain and myalgias.  Allergic/Immunologic: Positive for environmental allergies.  Psychiatric/Behavioral: The patient is nervous/anxious.   All other systems reviewed and are negative.  Past Medical History:  Diagnosis Date  . Anxiety   . Arthritis   . Cancer [facility])    breast cancer  . Depression   . Fatigue   . History of hyperthyroidism    resolved, no medicaions for treatment at this time  . Hypertension   . Insomnia   . OSA (obstructive sleep apnea)   . Pulmonary hypertension ([place])     Past Surgical History:  Procedure Laterality Date  . BREAST LUMPECTOMY     right side  . BREAST LUMPECTOMY WITH AXILLARY LYMPH NODE BIOPSY Left 06/17/2016   Procedure: BREAST LUMPECTOMY WITH AXILLARY LYMPH NODE BIOPSY;  Surgeon: Autumn Messing III, MD;   Location: [facility];  Service: General;  Laterality: Left;  . COLONOSCOPY    . DILATION AND CURETTAGE OF UTERUS    . EYE SURGERY     bilateral cataracts removed  . FOOT SURGERY    . PORTACATH PLACEMENT Right 07/16/2016   Procedure: INSERTION PORT-A-CATH RIGHT SUBLCLAVIAN;  Surgeon: Autumn Messing III, MD;  Location: [facility];  Service: General;  Laterality: Right;  . TONSILLECTOMY      Current Outpatient Medications on File Prior to Visit  Medication Sig Dispense Refill  . acetaminophen (TYLENOL) 650 MG CR tablet Take 650 mg by mouth every 8 (eight) hours as needed for pain.    Marland Kitchen anastrozole (ARIMIDEX) 1 MG tablet Take 0.5 tablets (0.5 mg total) by mouth daily. 90 tablet 1  . aspirin EC 81 MG tablet Take 81 mg by mouth daily.    . BELSOMRA 5 MG TABS Take 1 tablet by mouth daily.  1  . Calcium Carbonate-Vitamin D (CALCIUM 600+D3 PO) Take 1 tablet by mouth 2 (two) times daily.    . carbamide peroxide (DEBROX) 6.5 % otic solution Place 5 drops into both ears daily as needed (ear wax buildup).     . cycloSPORINE (RESTASIS) 0.05 % ophthalmic emulsion Place 1 drop into both eyes 2 (two) times daily.    . felodipine (PLENDIL) 10  MG 24 hr tablet Take 1 tablet (10 mg total) by mouth daily. 90 tablet 3  . fluticasone (FLONASE) 50 MCG/ACT nasal spray Place 2 sprays into the nose daily as needed for allergies.    . hydrALAZINE (APRESOLINE) 25 MG tablet Take 1 tablet (25 mg total) by mouth 3 (three) times daily. 270 tablet 3  . Liniments (SALONPAS PAIN RELIEF PATCH) PADS Apply 1 each topically daily as needed (pain).    Marland Kitchen losartan (COZAAR) 100 MG tablet Take 100 mg by mouth daily.    . Omega-3 Fatty Acids (FISH OIL TRIPLE STRENGTH) 1400 MG CAPS Take 1 tablet by mouth daily.    Vladimir Faster Glycol-Propyl Glycol (SYSTANE) 0.4-0.3 % SOLN Apply 1 drop to eye 2 (two) times daily.    . potassium chloride SA (K-DUR,KLOR-CON) 20 MEQ tablet Take 1 tablet (20 mEq total) by mouth daily. 3 tablet 0  . sertraline (ZOLOFT) 100  MG tablet Take 1 tablet by mouth daily.  0  . spironolactone (ALDACTONE) 25 MG tablet Take 0.5 tablets (12.5 mg total) daily by mouth. 45 tablet 3  . tretinoin (RETIN-A) 0.025 % cream Apply 1 application topically at bedtime.   3   No current facility-administered medications on file prior to visit.     Social History   Socioeconomic History  . Marital status: Married    Spouse name: Not on file  . Number of children: Not on file  . Years of education: Not on file  . Highest education level: Not on file  Social Needs  . Financial resource strain: Not on file  . Food insecurity - worry: Not on file  . Food insecurity - inability: Not on file  . Transportation needs - medical: Not on file  . Transportation needs - non-medical: Not on file  Occupational History  . Not on file  Tobacco Use  . Smoking status: Passive Smoke Exposure - Never Smoker  . Smokeless tobacco: Never Used  . Tobacco comment: Husband smoked  Substance and Sexual Activity  . Alcohol use: No    Alcohol/week: 0.0 oz  . Drug use: No  . Sexual activity: Not Currently  Other Topics Concern  . 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 Originally from [place]. Lived for years in [place]. Moved back to [place] in 1991. Has also lived in [place]. No pets currently. No bird or mold exposure.    Family History  Problem Relation Age of Onset  . Heart attack Mother   . Cancer Mother   . Heart disease Mother   . Hypertension Other   . Cancer Sister   . Heart disease Maternal Grandmother   . Lung disease Neg Hx   . Rheumatologic disease Neg Hx     BP (!) 152/63   Pulse 77   Temp 98.3 F (36.8 C)   Ht 5\' 3"  (1.6 m)   Wt 126 lb (57.2 kg)   BMI 22.32 kg/m      Objective:   Physical Exam  Constitutional: She is oriented to person, place, and time. She appears  well-developed and well-nourished.  HENT:  Head: Normocephalic and atraumatic.  Eyes: Conjunctivae and EOM are normal. Pupils are equal, round, and reactive to light.  Neck: Normal range of motion. Neck supple.  Cardiovascular: Normal rate, regular rhythm and intact distal pulses.  Pulmonary/Chest: Effort normal.  Abdominal: Soft.  Musculoskeletal: She exhibits tenderness (She has pain of the lumbar spine with lumbar scolosis apex at L3, no spasm, ROM forward 35 degrees, extension 5, lateral bend normal, NV intact, SLR negative.).  Neurological: She is alert and oriented to person, place, and time. She displays normal reflexes. No cranial nerve deficit. She exhibits normal muscle tone. Coordination normal.  Skin: Skin is warm and dry.  Psychiatric: She has a normal mood and affect. Her behavior is normal. Judgment and thought content normal.  Vitals reviewed.   X-rays of the lumbar spine were done, reported separately.  She has marked degenerative changes and scoliosis.      Assessment & Plan:   Encounter Diagnosis  Name Primary?  . Chronic left-sided low back pain with left-sided sciatica Yes   She cannot take any NSAID.  I have given Rx for Prednisone.  She may need new MRI if not improving.  She may need to reconsider PT.  Return in three weeks.  Call if any problem.  Precautions discussed.  Electronically Signed Sanjuana Kava, MD 12/19/20182:56 PM

## 2017-12-22 ENCOUNTER — Other Ambulatory Visit: Payer: Self-pay | Admitting: Cardiology

## 2017-12-30 ENCOUNTER — Encounter: Payer: Self-pay | Admitting: Orthopaedic Surgery

## 2017-12-30 ENCOUNTER — Ambulatory Visit (INDEPENDENT_AMBULATORY_CARE_PROVIDER_SITE_OTHER): Payer: Medicare Other | Admitting: Orthopaedic Surgery

## 2017-12-30 VITALS — BP 176/56 | HR 76 | Ht 63.0 in | Wt 128.0 lb

## 2017-12-30 DIAGNOSIS — G8929 Other chronic pain: Secondary | ICD-10-CM

## 2017-12-30 DIAGNOSIS — M5442 Lumbago with sciatica, left side: Secondary | ICD-10-CM

## 2017-12-30 NOTE — Progress Notes (Signed)
CC:  I am so much better  She has little pain of her back.  She is doing much better.  She has no numbness.  She uses her cane and has a good gait. ROM of the back is full with no pain.  NV intact.  Encounter Diagnosis  Name Primary?  . Chronic left-sided low back pain with left-sided sciatica Yes   I will see her as needed.  Continue exercises.  Call if any problem.  Precautions discussed.   Electronically Signed Sanjuana Kava, MD 1/10/20193:05 PM

## 2018-01-07 IMAGING — CT CT CHEST W/O CM
1 of 2 series · 13 of 32 positions shown, 18 images · non-contrast
Comparison: Chest CT 11/04/2015.  Chest x-ray 05/19/2016.

CLINICAL DATA: 87-year-old female former smoker with history of
breast cancer diagnosed 20 years ago. Abnormal chest x-ray from
05/19/2016 demonstrating small nodule in the right upper lobe.
Followup study.

EXAM:
CT CHEST WITHOUT CONTRAST
TECHNIQUE: Multidetector CT imaging of the chest was performed following the
standard protocol without IV contrast.

[Series 2: routine chest wo without · axial · non-contrast · 0.55mm/px · z∈[+900,+1148]mm · 13 of 144 slices shown, 18 images]
[im 10/144  soft-tissue]
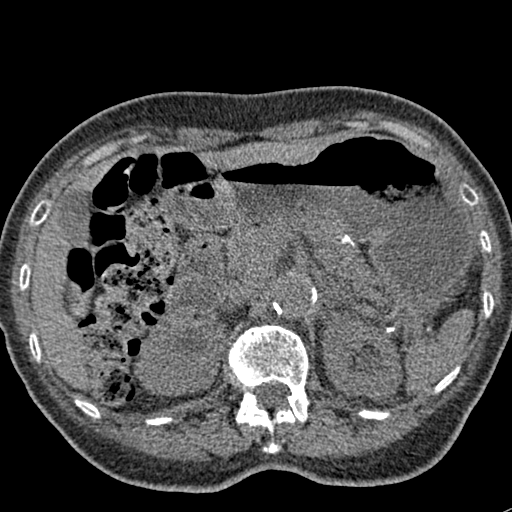
[im 10/144  bone]
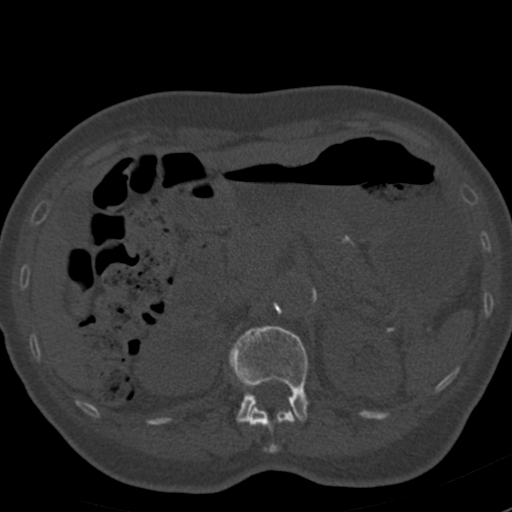
[im 20/144  soft-tissue]
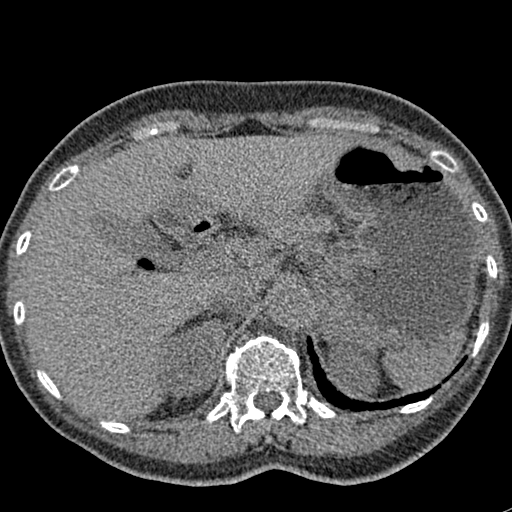
[im 29/144  soft-tissue]
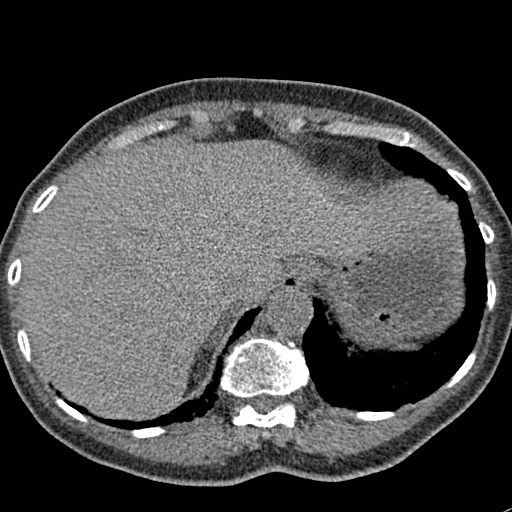
[im 48/144  soft-tissue]
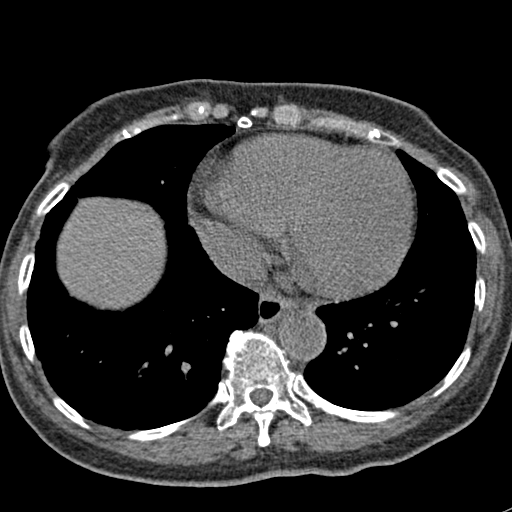
[im 58/144  soft-tissue]
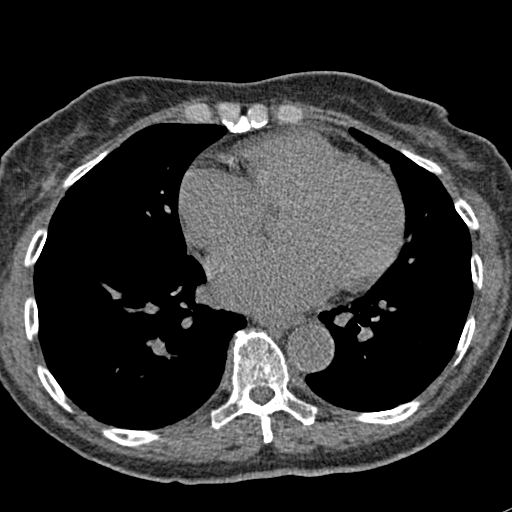
[im 67/144  soft-tissue]
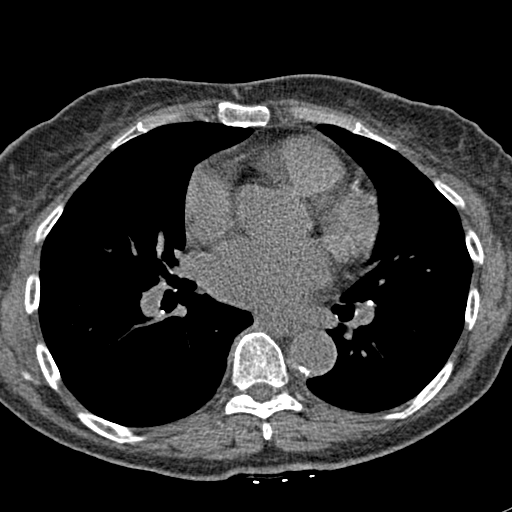
[im 77/144  soft-tissue]
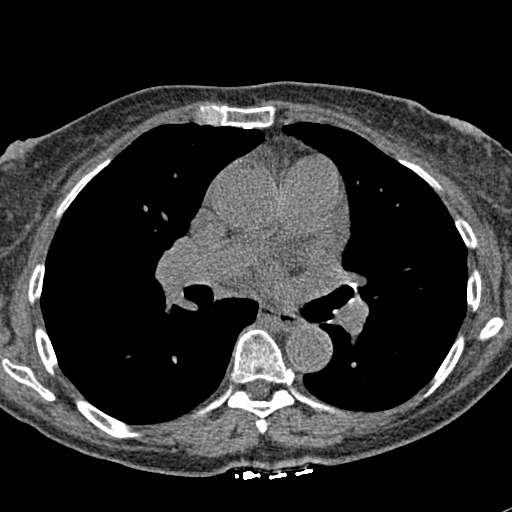
[im 86/144  soft-tissue]
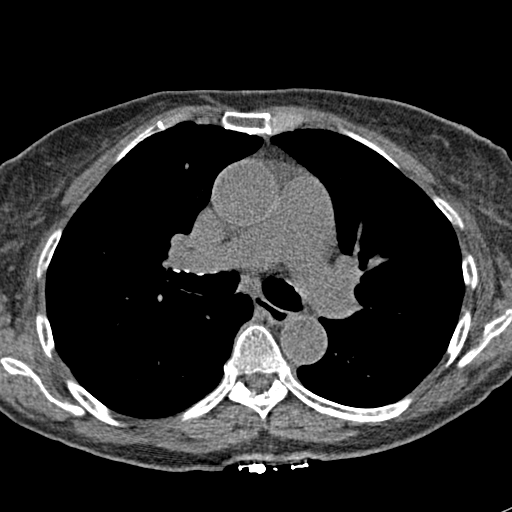
[im 96/144  soft-tissue]
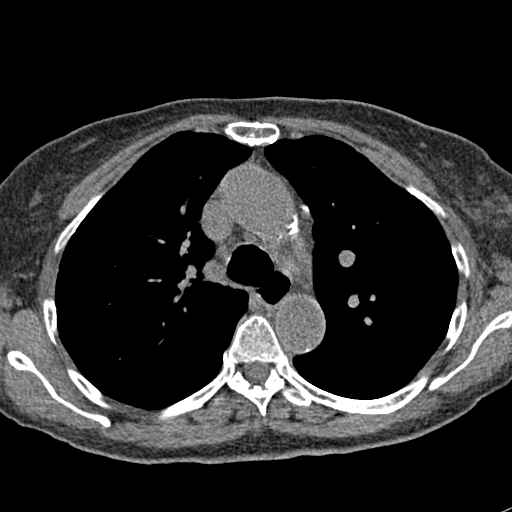
[im 96/144  bone]
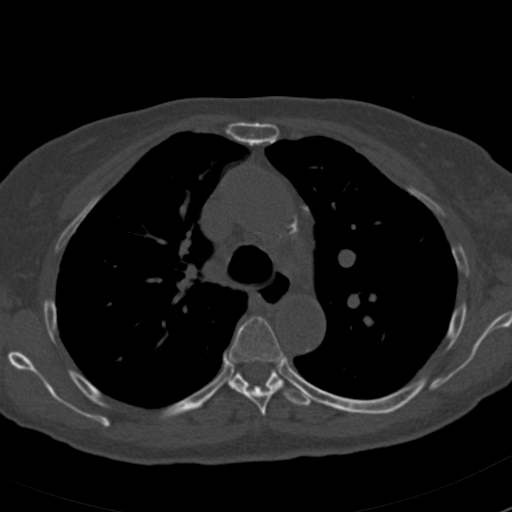
[im 105/144  lung]
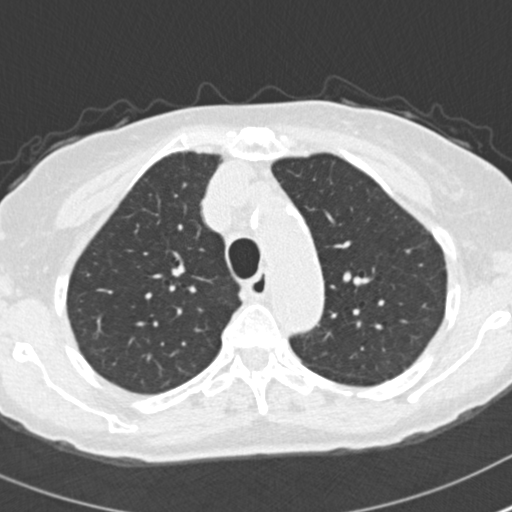
[im 115/144  soft-tissue]
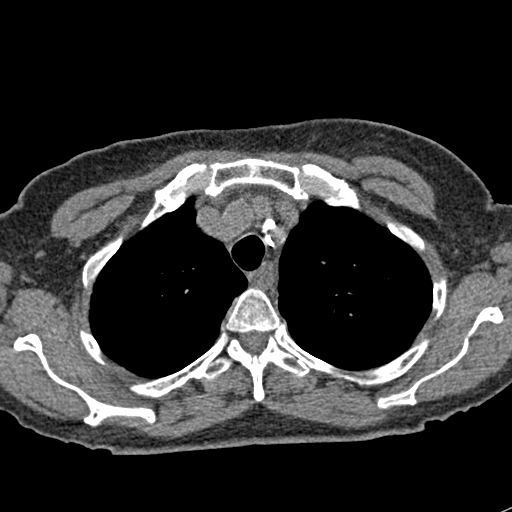
[im 115/144  lung]
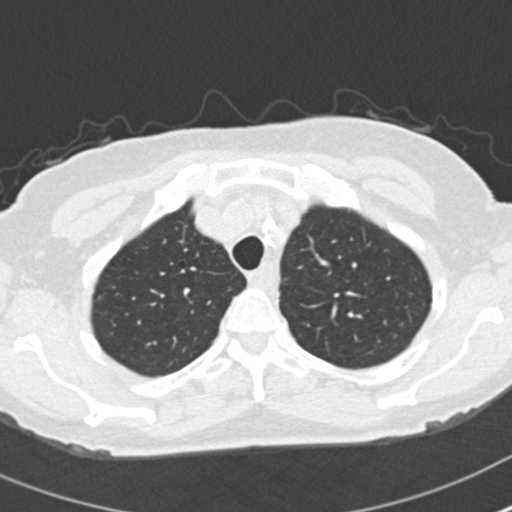
[im 124/144  soft-tissue]
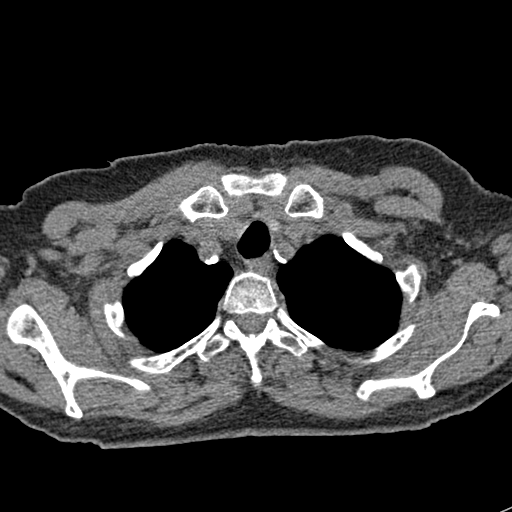
[im 124/144  lung]
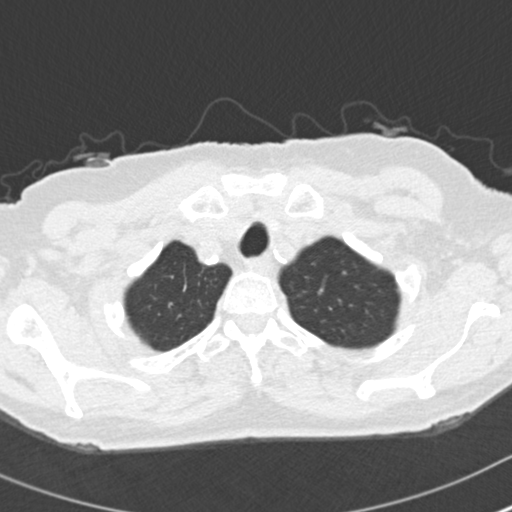
[im 134/144  soft-tissue]
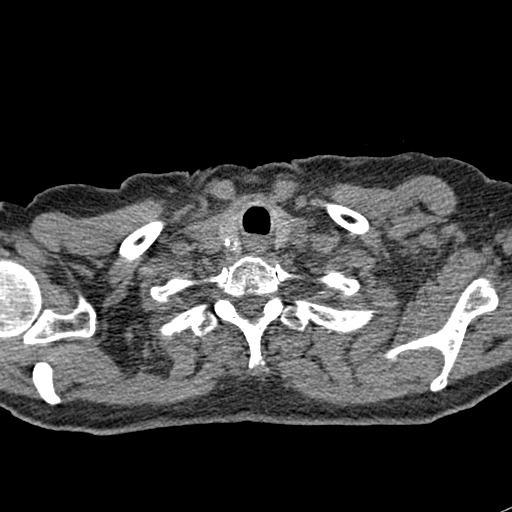
[im 134/144  lung]
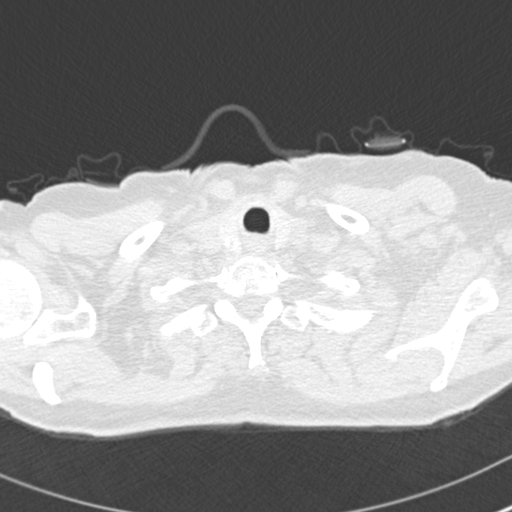

[13 of 32 positions shown; findings below may reference images not displayed]

FINDINGS: Mediastinum/Lymph Nodes: Heart size is normal. There is no
significant pericardial fluid, thickening or pericardial
calcification. There is atherosclerosis of the thoracic aorta, the
great vessels of the mediastinum and the coronary arteries,
including calcified atherosclerotic plaque in the left main, left
anterior descending and right coronary arteries. No pathologically
enlarged mediastinal or hilar lymph nodes. Please note that accurate
exclusion of hilar adenopathy is limited on noncontrast CT scans.
Dilatation of the pulmonic trunk (3.5 cm in diameter), suggestive of
pulmonary arterial hypertension. Esophagus is unremarkable in
appearance. No axillary lymphadenopathy.

Lungs/Pleura: No suspicious appearing pulmonary nodule or mass.
Specifically, no finding to account for the perceived new nodular
opacity in the right upper lobe on recent chest x-ray. There is
diffuse bronchial thickening with a relatively widespread
cylindrical bronchiectasis, most evident in the lower lobes of the
lungs bilaterally. Some patchy areas of ground-glass attenuation and
septal thickening are also noted throughout the basal portions of
both lungs. No acute consolidative airspace disease. No pleural
effusions.

Upper Abdomen: There is an incompletely visualized 3.5 cm
low-attenuation lesion in the interpolar region of the right kidney,
which is incompletely characterized but likely to represent a cyst.
Atherosclerosis.

Musculoskeletal/Soft Tissues: 1.4 x 1.4 x 2.4 cm soft tissue
attenuation (52 HU) lesion in the medial aspect of the left breast
(image 35 of series 2 and coronal image 15 of series 4). There are
no aggressive appearing lytic or blastic lesions noted in the
visualized portions of the skeleton.
IMPRESSION: 1. No suspicious appearing pulmonary nodules or masses identified.
2. However, there is a 1.4 x 1.4 x 2.4 cm soft tissue nodule in the
medial aspect of the left breast. Correlation with mammography
and/or breast ultrasound is suggested in the near future to better
evaluate this finding and exclude recurrent breast neoplasm.
3. There is a spectrum of imaging findings in the lungs which may
indicate interstitial lung disease, including bronchiectasis, areas
of ground-glass attenuation and some areas of septal thickening.
Repeat evaluation with high-resolution chest CT is suggested in 6-12
months to assess for temporal changes in the appearance of the lung
parenchyma if clinically appropriate.
4. Dilatation of the pulmonic trunk (3.5 cm in diameter), which may
suggest pulmonary arterial hypertension.
5. Atherosclerosis, including left main and 2 vessel coronary artery
disease.
6. Additional incidental findings, as above.
These results will be called to the ordering clinician or
representative by the Radiologist Assistant, and communication
documented in the PACS or zVision Dashboard.

## 2018-01-17 ENCOUNTER — Telehealth: Payer: Self-pay | Admitting: Orthopaedic Surgery

## 2018-01-17 NOTE — Telephone Encounter (Signed)
Patient called to relay that she finished the Prednisone, and that she has followed Dr's instructions and said her hip pain is still continuing.  Asking is she to come in or is there another recommendation?  If medication, pharmacy is [facility].

## 2018-01-18 ENCOUNTER — Ambulatory Visit: Payer: Medicare Other | Admitting: Cardiology

## 2018-01-18 ENCOUNTER — Encounter: Payer: Self-pay | Admitting: Cardiology

## 2018-01-18 VITALS — BP 166/80 | HR 72 | Ht 62.0 in | Wt 128.0 lb

## 2018-01-18 DIAGNOSIS — I10 Essential (primary) hypertension: Secondary | ICD-10-CM

## 2018-01-18 DIAGNOSIS — I272 Pulmonary hypertension, unspecified: Secondary | ICD-10-CM | POA: Diagnosis not present

## 2018-01-18 DIAGNOSIS — R7309 Other abnormal glucose: Secondary | ICD-10-CM

## 2018-01-18 DIAGNOSIS — E785 Hyperlipidemia, unspecified: Secondary | ICD-10-CM

## 2018-01-18 DIAGNOSIS — Z79899 Other long term (current) drug therapy: Secondary | ICD-10-CM | POA: Diagnosis not present

## 2018-01-18 NOTE — Telephone Encounter (Signed)
Called patient to relay, left voice mail message.

## 2018-01-18 NOTE — Telephone Encounter (Signed)
No other recommendations at this point.

## 2018-01-18 NOTE — Telephone Encounter (Signed)
Patient returned call; aware.

## 2018-01-18 NOTE — Patient Instructions (Signed)
Medication Instructions:  Your physician recommends that you continue on your current medications as directed. Please refer to the Current Medication list given to you today.   Labwork: ASAP   Testing/Procedures: NONE  Follow-Up: Your physician wants you to follow-up in: 6 MONTHS.  You will receive a reminder letter in the mail two months in advance. If you don't receive a letter, please call our office to schedule the follow-up appointment.   Any Other Special Instructions Will Be Listed Below (If Applicable).     If you need a refill on your cardiac medications before your next appointment, please call your pharmacy.   

## 2018-01-18 NOTE — Progress Notes (Signed)
Clinical Summary Charlotte [name] is a 84 y.o.female seen today for follow up of the following medical problems.    1. Decrease in LVsystolic dysfunction - echo 10/2016 LVEF 50%, down from 60-65%. Grade II diastolic dysfunction. PASP 64.  - hereceptin started 07/21/16, anastrazole started 09/01/16.   - herceptin has been stopped  - no recent SOB/DOE. Ongoing generalized fatigue. Exertion mainly limited by hip pain.   2. Breast cancer - history of lumpectomy 05/2016 and radiation (radiation completed 07/2016) - hereceptin started 07/21/16, anastrazole started 09/01/16.  - now off hereptin - followed by oncology  3. Pulmonary HTN - PASP above 50 on echos since 07/2015 - likely multifactorial. She has grade II diastolic dysfunction, OSA.  - followed by pulmonary   4. OSA - on cpap  5. HTN - fatigue with beta blockers  - reports fatigue on hydralazine, with lowered doses not improved  - aldactone 25mg  caused fatigue and hip pain per her report. We lowered to 12.5mg  daily. 12.5mg  continued to cause weakness and we changed to every other day dosing.  - she is unsure what happened to her lasix. Has not had any recent edema.  - home bps 130s-160s/60s-80s Past Medical History:  Diagnosis Date  . Anxiety   . Arthritis   . Cancer [facility])    breast cancer  . Depression   . Fatigue   . History of hyperthyroidism    resolved, no medicaions for treatment at this time  . Hypertension   . Insomnia   . OSA (obstructive sleep apnea)   . Pulmonary hypertension (HCC)      Allergies  Allergen Reactions  . Latex Swelling    SEVERITY OF SWELLING NOT DEFINED.  Marland Kitchen Bextra [Valdecoxib] Other (See Comments)    Stomach Pains  . Motrin [Ibuprofen] Other (See Comments)    Stomach pain     Current Outpatient Medications  Medication Sig Dispense Refill  . acetaminophen (TYLENOL) 650 MG CR tablet Take 650 mg by mouth every 8 (eight) hours as needed for pain.    Marland Kitchen anastrozole (ARIMIDEX)  1 MG tablet Take 0.5 tablets (0.5 mg total) by mouth daily. 90 tablet 1  . aspirin EC 81 MG tablet Take 81 mg by mouth daily.    . BELSOMRA 5 MG TABS Take 1 tablet by mouth daily.  1  . Calcium Carbonate-Vitamin D (CALCIUM 600+D3 PO) Take 1 tablet by mouth 2 (two) times daily.    . carbamide peroxide (DEBROX) 6.5 % otic solution Place 5 drops into both ears daily as needed (ear wax buildup).     . cycloSPORINE (RESTASIS) 0.05 % ophthalmic emulsion Place 1 drop into both eyes 2 (two) times daily.    . felodipine (PLENDIL) 10 MG 24 hr tablet Take 1 tablet (10 mg total) by mouth daily. 90 tablet 3  . fluticasone (FLONASE) 50 MCG/ACT nasal spray Place 2 sprays into the nose daily as needed for allergies.    . hydrALAZINE (APRESOLINE) 25 MG tablet Take 1 tablet (25 mg total) by mouth 3 (three) times daily. 270 tablet 3  . Liniments (SALONPAS PAIN RELIEF PATCH) PADS Apply 1 each topically daily as needed (pain).    Marland Kitchen losartan (COZAAR) 100 MG tablet Take 100 mg by mouth daily.    . Omega-3 Fatty Acids (FISH OIL TRIPLE STRENGTH) 1400 MG CAPS Take 1 tablet by mouth daily.    Vladimir Faster Glycol-Propyl Glycol (SYSTANE) 0.4-0.3 % SOLN Apply 1 drop to eye 2 (two) times  daily.    . potassium chloride SA (K-DUR,KLOR-CON) 20 MEQ tablet TAKE (1) TABLET BY MOUTH DAILY. 30 tablet 0  . predniSONE (STERAPRED UNI-PAK 21 TAB) 5 MG (21) TBPK tablet Take 6 pills first day; 5 pills second day; 4 pills third day; 3 pills fourth day; 2 pills next day and 1 pill last day. 21 tablet 0  . sertraline (ZOLOFT) 100 MG tablet Take 1 tablet by mouth daily.  0  . spironolactone (ALDACTONE) 25 MG tablet Take 0.5 tablets (12.5 mg total) daily by mouth. 45 tablet 3  . tretinoin (RETIN-A) 0.025 % cream Apply 1 application topically at bedtime.   3   No current facility-administered medications for this visit.      Past Surgical History:  Procedure Laterality Date  . BREAST LUMPECTOMY     right side  . BREAST LUMPECTOMY WITH  AXILLARY LYMPH NODE BIOPSY Left 06/17/2016   Procedure: BREAST LUMPECTOMY WITH AXILLARY LYMPH NODE BIOPSY;  Surgeon: Autumn Messing III, MD;  Location: [facility];  Service: General;  Laterality: Left;  . COLONOSCOPY    . DILATION AND CURETTAGE OF UTERUS    . EYE SURGERY     bilateral cataracts removed  . FOOT SURGERY    . PORTACATH PLACEMENT Right 07/16/2016   Procedure: INSERTION PORT-A-CATH RIGHT SUBLCLAVIAN;  Surgeon: Autumn Messing III, MD;  Location: [facility];  Service: General;  Laterality: Right;  . TONSILLECTOMY       Allergies  Allergen Reactions  . Latex Swelling    SEVERITY OF SWELLING NOT DEFINED.  Marland Kitchen Bextra [Valdecoxib] Other (See Comments)    Stomach Pains  . Motrin [Ibuprofen] Other (See Comments)    Stomach pain      Family History  Problem Relation Age of Onset  . Heart attack Mother   . Cancer Mother   . Heart disease Mother   . Hypertension Other   . Cancer Sister   . Heart disease Maternal Grandmother   . Lung disease Neg Hx   . Rheumatologic disease Neg Hx      Social History Charlotte [name] reports that she is a non-smoker but has been exposed to tobacco smoke. she has never used smokeless tobacco. Charlotte [name] reports that she does not drink alcohol.   Review of Systems CONSTITUTIONAL: No weight loss, fever, chills, weakness or fatigue.  HEENT: Eyes: No visual loss, blurred vision, double vision or yellow sclerae.No hearing loss, sneezing, congestion, runny nose or sore throat.  SKIN: No rash or itching.  CARDIOVASCULAR: per hpi RESPIRATORY: No shortness of breath, cough or sputum.  GASTROINTESTINAL: No anorexia, nausea, vomiting or diarrhea. No abdominal pain or blood.  GENITOURINARY: No burning on urination, no polyuria NEUROLOGICAL: No headache, dizziness, syncope, paralysis, ataxia, numbness or tingling in the extremities. No change in bowel or bladder control.  MUSCULOSKELETAL: +hip pain  LYMPHATICS: No enlarged nodes. No history of splenectomy.  PSYCHIATRIC: No  history of depression or anxiety.  ENDOCRINOLOGIC: No reports of sweating, cold or heat intolerance. No polyuria or polydipsia.  Marland Kitchen   Physical Examination Vitals:   01/18/18 1124  BP: (!) 166/80  Pulse: 72  SpO2: 96%   Vitals:   01/18/18 1124  Weight: 128 lb (58.1 kg)  Height: 5\' 2"  (1.575 m)    Gen: resting comfortably, no acute distress HEENT: no scleral icterus, pupils equal round and reactive, no palptable cervical adenopathy,  CV: RRR, no m/r/g, no jvd Resp: Clear to auscultation bilaterally GI: abdomen is soft, non-tender, non-distended, normal bowel  sounds, no hepatosplenomegaly MSK: extremities are warm, no edema.  Skin: warm, no rash Neuro:  no focal deficits Psych: appropriate affect   Diagnostic Studies 07/2015 echo Study Conclusions  - Left ventricle: The cavity size was normal. Wall thickness was normal. Systolic function was normal. The estimated ejection fraction was in the range of 55% to 60%. Wall motion was normal; there were no regional wall motion abnormalities. Doppler parameters are consistent with abnormal left ventricular relaxation (grade 1 diastolic dysfunction). There is evidence of elevated LA pressure. - Aortic valve: Mildly calcified annulus. Trileaflet; mildly thickened leaflets. Valve area (VTI): 1.78 cm^2. Valve area (Vmax): 1.91 cm^2. - Mitral valve: Mildly calcified annulus. Normal thickness leaflets . There was mild regurgitation. - Left atrium: The atrium was moderately dilated. - Atrial septum: No defect or patent foramen ovale was identified. - Pulmonary arteries: Systolic pressure was moderately increased. PA peak pressure: 49 mm Hg (S). - Technically adequate study.  10/2015 CT PE IMPRESSION: 1. No acute findings identified. No evidence for acute pulmonary embolus. 2. Aortic atherosclerosis and LAD coronary artery calcification.   06/2016 echo Study Conclusions  - Left ventricle: The cavity size  was normal. Wall thickness was normal. Systolic function was normal. The estimated ejection fraction was in the range of 60% to 65%. Wall motion was normal; there were no regional wall motion abnormalities. Doppler parameters are consistent with abnormal left ventricular relaxation (grade 1 diastolic dysfunction). Doppler parameters are consistent with high ventricular filling pressure. - Aortic valve: Valve area (VTI): 2.41 cm^2. Valve area (Vmax): 2.51 cm^2. Valve area (Vmean): 2.45 cm^2. - Mitral valve: There was mild regurgitation. - Left atrium: The atrium was moderately dilated. - Right atrium: The atrium was moderately dilated. - Pulmonary arteries: Systolic pressure was moderately increased. PA peak pressure: 51 mm Hg (S). - Technically adequate study.   10/2016 echo Study Conclusions  - Left ventricle: The cavity size was normal. Wall thickness was increased in a pattern of moderate LVH. Systolic function was mildly reduced. The estimated ejection fraction was 50%. LVEF calculated at 46% by speckle tracking. Reduced global longitudinal strain of -13.8%. Features are consistent with a pseudonormal left ventricular filling pattern, with concomitant abnormal relaxation and increased filling pressure (grade 2 diastolic dysfunction). - Aortic valve: Mildly calcified annulus. Trileaflet; mildly thickened leaflets. There was trivial regurgitation. - Mitral valve: There was mild regurgitation. - Left atrium: The atrium was moderately dilated. - Right atrium: Central venous pressure (est): 3 mm Hg. - Atrial septum: No defect or patent foramen ovale was identified. - Tricuspid valve: There was mild regurgitation. - Pulmonary arteries: Systolic pressure was severely increased. PA peak pressure: 64 mm Hg (S). - Pericardium, extracardiac: There was no pericardial effusion.   01/2017 echo Study Conclusions  - Left ventricle: Apical windows  are difficult. Some foreshortening makes evaluation of LVEF difficult LVEF is probably normal to mildly depressed Would recomm ordering limited echo with Definity to further define. The cavity size was normal. Wall thickness was normal. Doppler parameters are consistent with abnormal left ventricular relaxation (grade 1 diastolic dysfunction). - Aortic valve: There was trivial regurgitation. - Mitral valve: There was mild regurgitation. - Left atrium: The atrium was moderately dilated. - Pulmonary arteries: PA peak pressure: 46 mm Hg (S).     Assessment and Plan  1. 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 Recent echo shows drop in LVEF from 60-65% to 50%, though still within the low normal range of function  this drop correltes with her starting herceptin. Now off herceptin - no recent symptoms, continue to monintor.    2. Pulmonary HTN - likely combined left sided heart disease with her grade II diastolic dysfunction, combined with OSA and posible underlying lung disease - no recent symptoms, continue to monitor.   3. HTN - elevated in clinic, home bp's better but still elevated. Accepting higher bp's for her, therapy has been limited due to side effects on multiple meds   Check annual labs      Arnoldo Lenis, M.D.

## 2018-01-20 ENCOUNTER — Encounter: Payer: Self-pay | Admitting: Cardiology

## 2018-01-20 DIAGNOSIS — R002 Palpitations: Secondary | ICD-10-CM | POA: Diagnosis not present

## 2018-01-20 DIAGNOSIS — E785 Hyperlipidemia, unspecified: Secondary | ICD-10-CM | POA: Diagnosis not present

## 2018-01-20 DIAGNOSIS — Z79899 Other long term (current) drug therapy: Secondary | ICD-10-CM | POA: Diagnosis not present

## 2018-01-20 DIAGNOSIS — R7309 Other abnormal glucose: Secondary | ICD-10-CM | POA: Diagnosis not present

## 2018-01-21 ENCOUNTER — Other Ambulatory Visit: Payer: Self-pay | Admitting: Cardiology

## 2018-01-21 LAB — TSH: TSH: 1.58 m[IU]/L (ref 0.40–4.50)

## 2018-01-21 LAB — COMPREHENSIVE METABOLIC PANEL
AG RATIO: 1.2 (calc) (ref 1.0–2.5)
ALKALINE PHOSPHATASE (APISO): 60 U/L (ref 33–130)
ALT: 20 U/L (ref 6–29)
AST: 22 U/L (ref 10–35)
Albumin: 4.1 g/dL (ref 3.6–5.1)
BILIRUBIN TOTAL: 0.5 mg/dL (ref 0.2–1.2)
BUN/Creatinine Ratio: 16 (calc) (ref 6–22)
BUN: 15 mg/dL (ref 7–25)
CALCIUM: 10.2 mg/dL (ref 8.6–10.4)
CO2: 32 mmol/L (ref 20–32)
Chloride: 97 mmol/L — ABNORMAL LOW (ref 98–110)
Creat: 0.93 mg/dL — ABNORMAL HIGH (ref 0.60–0.88)
Globulin: 3.3 g/dL (calc) (ref 1.9–3.7)
Glucose, Bld: 106 mg/dL — ABNORMAL HIGH (ref 65–99)
Potassium: 4.1 mmol/L (ref 3.5–5.3)
SODIUM: 136 mmol/L (ref 135–146)
TOTAL PROTEIN: 7.4 g/dL (ref 6.1–8.1)

## 2018-01-21 LAB — MAGNESIUM: MAGNESIUM: 1.8 mg/dL (ref 1.5–2.5)

## 2018-01-21 LAB — CBC WITH DIFFERENTIAL/PLATELET
BASOS ABS: 22 {cells}/uL (ref 0–200)
Basophils Relative: 0.4 %
EOS ABS: 39 {cells}/uL (ref 15–500)
Eosinophils Relative: 0.7 %
HCT: 34.6 % — ABNORMAL LOW (ref 35.0–45.0)
HEMOGLOBIN: 11.6 g/dL — AB (ref 11.7–15.5)
LYMPHS ABS: 1568 {cells}/uL (ref 850–3900)
MCH: 28.2 pg (ref 27.0–33.0)
MCHC: 33.5 g/dL (ref 32.0–36.0)
MCV: 84.2 fL (ref 80.0–100.0)
MPV: 9.7 fL (ref 7.5–12.5)
Monocytes Relative: 8.6 %
NEUTROS ABS: 3489 {cells}/uL (ref 1500–7800)
NEUTROS PCT: 62.3 %
Platelets: 348 10*3/uL (ref 140–400)
RBC: 4.11 10*6/uL (ref 3.80–5.10)
RDW: 14.8 % (ref 11.0–15.0)
Total Lymphocyte: 28 %
WBC mixed population: 482 cells/uL (ref 200–950)
WBC: 5.6 10*3/uL (ref 3.8–10.8)

## 2018-01-21 LAB — LIPID PANEL
Cholesterol: 189 mg/dL (ref <200)
HDL: 112 mg/dL (ref >50)
LDL Cholesterol (Calc): 57 mg/dL (calc)
NON-HDL CHOLESTEROL (CALC): 77 mg/dL (ref <130)
Total CHOL/HDL Ratio: 1.7 (calc) (ref <5.0)
Triglycerides: 117 mg/dL (ref <150)

## 2018-01-21 LAB — HEMOGLOBIN A1C
EAG (MMOL/L): 7 (calc)
Hgb A1c MFr Bld: 6 % of total Hgb — ABNORMAL HIGH (ref <5.7)
Mean Plasma Glucose: 126 (calc)

## 2018-02-02 DIAGNOSIS — M1990 Unspecified osteoarthritis, unspecified site: Secondary | ICD-10-CM | POA: Diagnosis not present

## 2018-02-02 DIAGNOSIS — F339 Major depressive disorder, recurrent, unspecified: Secondary | ICD-10-CM | POA: Diagnosis not present

## 2018-02-02 DIAGNOSIS — C50912 Malignant neoplasm of unspecified site of left female breast: Secondary | ICD-10-CM | POA: Diagnosis not present

## 2018-02-02 DIAGNOSIS — I10 Essential (primary) hypertension: Secondary | ICD-10-CM | POA: Diagnosis not present

## 2018-02-03 DIAGNOSIS — F321 Major depressive disorder, single episode, moderate: Secondary | ICD-10-CM | POA: Diagnosis not present

## 2018-02-12 ENCOUNTER — Other Ambulatory Visit: Payer: Self-pay | Admitting: Cardiology

## 2018-02-23 ENCOUNTER — Other Ambulatory Visit: Payer: Self-pay | Admitting: Cardiology

## 2018-02-28 IMAGING — CR DG CHEST 1V PORT
1 series · 1 of 1 positions shown · non-contrast
Comparison: Chest radiograph May 19, 2016; chest CT May 25, 2016

CLINICAL DATA: Port-A-Cath placement

EXAM:
PORTABLE CHEST 1 VIEW

[AP]
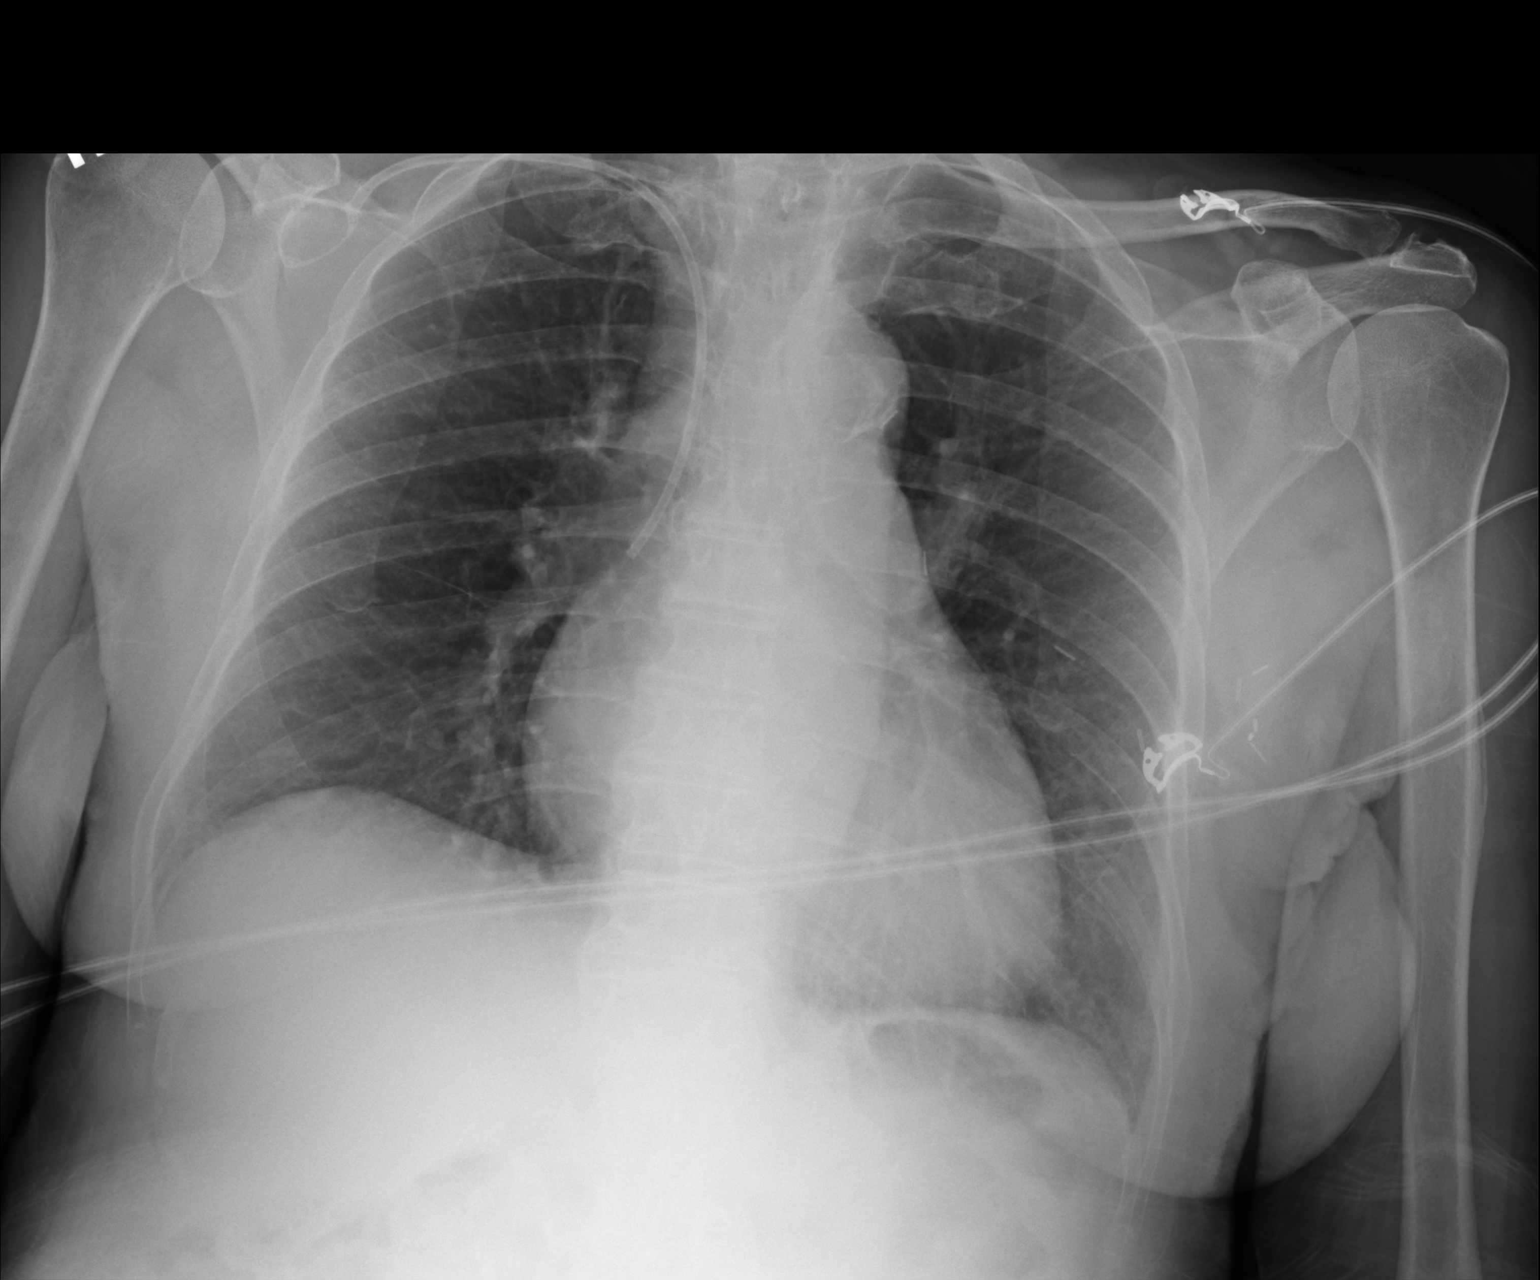

[1 of 1 positions shown; findings below may reference images not displayed]

FINDINGS: Port-A-Cath tip is in the superior vena cava. No pneumothorax. There
is no edema or consolidation. Heart is upper normal in size with
pulmonary vascularity within normal limits. There is atherosclerotic
calcification in the aorta. There are surgical clips over the left
breast region.
IMPRESSION: Port-A-Cath tip in superior vena cava. No pneumothorax. No edema or
consolidation. Aortic atherosclerosis. Stable cardiac silhouette.

## 2018-03-28 ENCOUNTER — Other Ambulatory Visit: Payer: Self-pay | Admitting: Cardiology

## 2018-04-13 DIAGNOSIS — M545 Low back pain: Secondary | ICD-10-CM | POA: Diagnosis not present

## 2018-04-13 DIAGNOSIS — I10 Essential (primary) hypertension: Secondary | ICD-10-CM | POA: Diagnosis not present

## 2018-04-26 ENCOUNTER — Encounter (HOSPITAL_COMMUNITY): Payer: Self-pay

## 2018-04-26 ENCOUNTER — Emergency Department (HOSPITAL_COMMUNITY): Payer: Medicare Other

## 2018-04-26 ENCOUNTER — Other Ambulatory Visit: Payer: Self-pay

## 2018-04-26 ENCOUNTER — Emergency Department (HOSPITAL_COMMUNITY)
Admission: EM | Admit: 2018-04-26 | Discharge: 2018-04-26 | Disposition: A | Discharge status: Home / Self Care | Payer: Medicare Other | Attending: Emergency Medicine | Admitting: Emergency Medicine

## 2018-04-26 DIAGNOSIS — Z7722 Contact with and (suspected) exposure to environmental tobacco smoke (acute) (chronic): Secondary | ICD-10-CM | POA: Insufficient documentation

## 2018-04-26 DIAGNOSIS — I10 Essential (primary) hypertension: Secondary | ICD-10-CM | POA: Diagnosis not present

## 2018-04-26 DIAGNOSIS — R531 Weakness: Secondary | ICD-10-CM | POA: Diagnosis not present

## 2018-04-26 DIAGNOSIS — Z853 Personal history of malignant neoplasm of breast: Secondary | ICD-10-CM | POA: Insufficient documentation

## 2018-04-26 DIAGNOSIS — H6123 Impacted cerumen, bilateral: Secondary | ICD-10-CM | POA: Insufficient documentation

## 2018-04-26 DIAGNOSIS — Z91040 Latex allergy status: Secondary | ICD-10-CM | POA: Insufficient documentation

## 2018-04-26 DIAGNOSIS — R0602 Shortness of breath: Secondary | ICD-10-CM | POA: Diagnosis not present

## 2018-04-26 DIAGNOSIS — D649 Anemia, unspecified: Secondary | ICD-10-CM | POA: Diagnosis not present

## 2018-04-26 DIAGNOSIS — H90A11 Conductive hearing loss, unilateral, right ear with restricted hearing on the contralateral side: Secondary | ICD-10-CM | POA: Diagnosis not present

## 2018-04-26 HISTORY — DX: Dorsalgia, unspecified: M54.9

## 2018-04-26 LAB — BASIC METABOLIC PANEL
Anion gap: 9 (ref 5–15)
BUN: 16 mg/dL (ref 6–20)
CO2: 29 mmol/L (ref 22–32)
CREATININE: 0.9 mg/dL (ref 0.44–1.00)
Calcium: 9.5 mg/dL (ref 8.9–10.3)
Chloride: 93 mmol/L — ABNORMAL LOW (ref 101–111)
GFR calc non Af Amer: 56 mL/min — ABNORMAL LOW (ref >60)
Glucose, Bld: 119 mg/dL — ABNORMAL HIGH (ref 65–99)
POTASSIUM: 3.7 mmol/L (ref 3.5–5.1)
Sodium: 131 mmol/L — ABNORMAL LOW (ref 135–145)

## 2018-04-26 LAB — URINALYSIS, ROUTINE W REFLEX MICROSCOPIC
Bilirubin Urine: NEGATIVE
Glucose, UA: NEGATIVE mg/dL
HGB URINE DIPSTICK: NEGATIVE
Ketones, ur: NEGATIVE mg/dL
LEUKOCYTES UA: NEGATIVE
Nitrite: NEGATIVE
Protein, ur: NEGATIVE mg/dL
SPECIFIC GRAVITY, URINE: 1.003 — AB (ref 1.005–1.030)
pH: 8 (ref 5.0–8.0)

## 2018-04-26 LAB — HEPATIC FUNCTION PANEL
ALBUMIN: 3.8 g/dL (ref 3.5–5.0)
ALK PHOS: 45 U/L (ref 38–126)
ALT: 23 U/L (ref 14–54)
AST: 22 U/L (ref 15–41)
Bilirubin, Direct: <0.1 mg/dL — ABNORMAL LOW (ref 0.1–0.5)
TOTAL PROTEIN: 6.7 g/dL (ref 6.5–8.1)
Total Bilirubin: 0.7 mg/dL (ref 0.3–1.2)

## 2018-04-26 LAB — CBC WITH DIFFERENTIAL/PLATELET
Basophils Absolute: 0 10*3/uL (ref 0.0–0.1)
Basophils Relative: 0 %
Eosinophils Absolute: 0.1 10*3/uL (ref 0.0–0.7)
Eosinophils Relative: 1 %
HCT: 28.6 % — ABNORMAL LOW (ref 36.0–46.0)
HEMOGLOBIN: 9.4 g/dL — AB (ref 12.0–15.0)
LYMPHS ABS: 1.4 10*3/uL (ref 0.7–4.0)
Lymphocytes Relative: 23 %
MCH: 29 pg (ref 26.0–34.0)
MCHC: 32.9 g/dL (ref 30.0–36.0)
MCV: 88.3 fL (ref 78.0–100.0)
MONOS PCT: 9 %
Monocytes Absolute: 0.6 10*3/uL (ref 0.1–1.0)
NEUTROS ABS: 4.1 10*3/uL (ref 1.7–7.7)
NEUTROS PCT: 67 %
Platelets: 282 10*3/uL (ref 150–400)
RBC: 3.24 MIL/uL — AB (ref 3.87–5.11)
RDW: 15.2 % (ref 11.5–15.5)
WBC: 6.2 10*3/uL (ref 4.0–10.5)

## 2018-04-26 LAB — TSH: TSH: 1.619 u[IU]/mL (ref 0.350–4.500)

## 2018-04-26 LAB — TROPONIN I: Troponin I: <0.03 ng/mL (ref <0.03)

## 2018-04-26 MED ORDER — SODIUM CHLORIDE 0.9 % IV BOLUS
500.0000 mL | Freq: Once | INTRAVENOUS | Status: AC
  Administered 2018-04-26: 500 mL via INTRAVENOUS

## 2018-04-26 NOTE — ED Notes (Signed)
Pt was informed that we need a urine sample. Pt states that she can not urinate at this time. 

## 2018-04-26 NOTE — ED Notes (Signed)
Pt's daughter now calling facility to see if it's possible to get patient in today

## 2018-04-26 NOTE — ED Provider Notes (Signed)
Emergency Department Provider Note   I have reviewed the triage vital signs and the nursing notes.   Charlotte  Chief Complaint Shortness of Breath   HPI TAKERRA [name] is a 84 y.o. female with PMH of OSA, Pulmonary HTN, HTN, and arthritis department for evaluation of generalized weakness with baseline shortness of breath and one episode of dizziness with blurred vision.  Patient states that she was prescribed a short course of prednisone in late April for sciatica.  She had also been taking naproxen for pain.  Her sciatica symptoms have improved but shortly after stopping the prednisone after a 5-day course she felt more weak than normal.  Her generalized weakness has progressively worsened the past 7 days.  She notices some shortness of breath denies any chest or abdominal pain. Denies any weakness or numbness.  She reports one episode while getting out of the shower where she felt lightheaded and had momentary blurred vision but both symptoms resolved after stopping to rest.  She does note some blockage in the right ear but denies any ear ringing, vertigo, or pain at this time.  She has been using eardrops as recommended by her primary care physician.   Past Medical Charlotte:  Diagnosis Date  . Anxiety   . Arthritis   . Back pain   . Cancer ([place])    breast cancer x 2  . Depression   . Fatigue   . Charlotte of hyperthyroidism    resolved, no medicaions for treatment at this time  . Hypertension   . Insomnia   . OSA (obstructive sleep apnea)   . Pulmonary hypertension [facility])     Patient Active Problem List   Diagnosis Date Noted  . Pulmonary HTN ([place]) 12/23/2016  . Arthritis 12/23/2016  . Essential hypertension 12/09/2016  . 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 Chronic insomnia 11/04/2016  . 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 OSA (obstructive sleep apnea) 11/04/2016  . Snoring 11/04/2016  . Fatigue due to treatment 11/04/2016  . Malignant  neoplasm of upper-inner quadrant of left female breast ([place]) 07/01/2016  . Generalized anxiety disorder 01/28/2016  . SHOULDER, ARTHRITIS, DEGEN./OSTEO 12/23/2009  . IMPINGEMENT SYNDROME 12/23/2009  . Charlotte of cardiovascular disorder 12/23/2009    Past Surgical Charlotte:  Procedure Laterality Date  . BREAST LUMPECTOMY     right side  . BREAST LUMPECTOMY WITH AXILLARY LYMPH NODE BIOPSY Left 06/17/2016   Procedure: BREAST LUMPECTOMY WITH AXILLARY LYMPH NODE BIOPSY;  Surgeon: Autumn Messing III, MD;  Location: [place];  Service: General;  Laterality: Left;  . COLONOSCOPY    . DILATION AND CURETTAGE OF UTERUS    . EYE SURGERY     bilateral cataracts removed  . FOOT SURGERY    . PORTACATH PLACEMENT Right 07/16/2016   Procedure: INSERTION PORT-A-CATH RIGHT SUBLCLAVIAN;  Surgeon: Autumn Messing III, MD;  Location: [place];  Service: General;  Laterality: Right;  . TONSILLECTOMY      Current Outpatient Rx  . Order #: [id number] Class: Normal  . Order #: [id number] Class: Historical Med  . Order #: [id number] Class: Historical Med  . Order #: [id number] Class: Historical Med  . Order #: [id number] Class: Historical Med  . Order #: [id number] Class: Historical Med  . Order #: [id number] Class: Normal  . Order #: [id number] Class: Historical Med  . Order #: [id number] Class: Normal  . Order #: [id number] Class: Historical Med  . Order #: [id number] Class: Historical Med  . Order #: [id number] Class: Historical Med  . Order #: [id number] Class: Historical Med  .  Order #: [id number] Class: Historical Med  . Order #: [id number] Class: Normal  . Order #: [id number] Class: Historical Med  . Order #: [id number] Class: Normal  . Order #: [id number] Class: Historical Med    Allergies Latex; Bextra [valdecoxib]; and Motrin [ibuprofen]  Family Charlotte  Problem Relation Age of Onset  . Heart attack Mother   . Cancer Mother   . Heart disease Mother   . Hypertension Other   . Cancer Sister   . Heart disease Maternal Grandmother   . Lung  disease Neg Hx   . Rheumatologic disease Neg Hx     Social Charlotte Social Charlotte   Tobacco Use  . Smoking status: Passive Smoke Exposure - Never Smoker  . Smokeless tobacco: Never Used  . Tobacco comment: Husband smoked  Substance Use Topics  . Alcohol use: No    Alcohol/week: 0.0 oz  . Drug use: No    Review of Systems  Constitutional: No fever/chills. Positive generalized weakness (acute on chronic per family).  Eyes: No visual changes. ENT: No sore throat. Positive ear wax impaction (right).  Cardiovascular: Denies chest pain. Respiratory: Positive shortness of breath (baseline) Gastrointestinal: No abdominal pain.  No nausea, no vomiting.  No diarrhea.  No constipation. Genitourinary: Negative for dysuria. Musculoskeletal: Negative for back pain. Skin: Negative for rash. Neurological: Negative for headaches, focal weakness or numbness.  10-point ROS otherwise negative.  ____________________________________________   PHYSICAL EXAM:  VITAL SIGNS: ED Triage Vitals  Enc Vitals Group     BP 04/26/18 0958 (!) 177/56     Pulse Rate 04/26/18 0958 (!) 55     Resp 04/26/18 0958 12     Temp 04/26/18 0958 97.8 F (36.6 C)     Temp Source 04/26/18 0958 Oral     SpO2 04/26/18 0958 99 %     Weight 04/26/18 0953 124 lb (56.2 kg)     Height 04/26/18 0953 5\' 3"  (1.6 m)     Pain Score 04/26/18 0953 0   Constitutional: Alert and oriented. Well appearing and in no acute distress. Eyes: Conjunctivae are normal. PERRL. EOMI. Head: Atraumatic. Ears:  Patient with bilateral cerumen impaction without canal erythema.  Nose: No congestion/rhinnorhea. Mouth/Throat: Mucous membranes are slightly dry.  Neck: No stridor.  Cardiovascular: Bradycardia. Good peripheral circulation. Grossly normal heart sounds.   Respiratory: Normal respiratory effort.  No retractions. Lungs CTAB. Gastrointestinal: Soft and nontender. No distention.  Musculoskeletal: No lower extremity tenderness nor  edema. No gross deformities of extremities. Neurologic:  Normal speech and language. No gross focal neurologic deficits are appreciated. Normal CN exam 2-12. No pronator drift.  Skin:  Skin is warm, dry and intact. No rash noted.  ____________________________________________   LABS (all labs ordered are listed, but only abnormal results are displayed)  Labs Reviewed  CBC WITH DIFFERENTIAL/PLATELET - Abnormal; Notable for the following components:      Result Value   RBC 3.24 (*)    Hemoglobin 9.4 (*)    HCT 28.6 (*)    All other components within normal limits  BASIC METABOLIC PANEL - Abnormal; Notable for the following components:   Sodium 131 (*)    Chloride 93 (*)    Glucose, Bld 119 (*)    GFR calc non Af Amer 56 (*)    All other components within normal limits  URINALYSIS, ROUTINE W REFLEX MICROSCOPIC - Abnormal; Notable for the following components:   Color, Urine COLORLESS (*)    Specific Gravity, Urine 1.003 (*)    All other  components within normal limits  HEPATIC FUNCTION PANEL - Abnormal; Notable for the following components:   Bilirubin, Direct <0.1 (*)    All other components within normal limits  TROPONIN I  TSH   ____________________________________________  EKG   EKG Interpretation  Date/Time:  Tuesday Apr 26 2018 09:54:46 EDT Ventricular Rate:  57 PR Interval:    QRS Duration: 104 QT Interval:  415 QTC Calculation: 404 R Axis:   -20 Text Interpretation:  Sinus rhythm Atrial premature complex LVH with secondary repolarization abnormality No STEMI.  Confirmed by Nanda Quinton [id number]) on 04/26/2018 10:06:22 AM       ____________________________________________  RADIOLOGY  Dg Chest 2 View  Result Date: 04/26/2018 CLINICAL DATA:  Shortness of breath and weakness. Charlotte of breast malignancy, pulmonary hypertension. The patient was on prednisone last week for sciatic symptoms. EXAM: CHEST - 2 VIEW COMPARISON:  PA and lateral chest x-ray of September 09, 2017 FINDINGS: The lungs are adequately inflated. There is no acute infiltrate. The left perihilar fibrotic changes are stable. The heart is top-normal in size. The pulmonary vascularity is not engorged. There is calcification in the wall of the aortic arch. The trachea is midline. The bony thorax exhibits no acute abnormality. IMPRESSION: Chronic interstitial changes, stable. No acute infiltrate or pleural effusion. Stable cardiomegaly without pulmonary edema. Thoracic aortic atherosclerosis. Electronically Signed   By: David  Martinique M.D.   On: 04/26/2018 11:28    ____________________________________________   PROCEDURES  Procedure(s) performed:   Procedures  None ____________________________________________   INITIAL IMPRESSION / ASSESSMENT AND PLAN / ED COURSE  Pertinent labs & imaging results that were available during my care of the patient were reviewed by me and considered in my medical decision making (see chart for details).  The emergency department for evaluation of generalized weakness with what sounds like shortness of breath that is at baseline.  She had one episode of blurry vision that lasted several seconds and improved with rest.  She has been feeling more weak and lightheaded with standing.  Doubt this is related to prednisone given multiple days since stopping this medication.  Plan for screening labs, chest x-ray, gentle IV fluids, and reassess.  Patient labs and CXR reviewed. Nursing attempted cerumen impaction removal without success. Afebrile here. Baseline bradycardia. Plan for PCP follow up and continued ear drops and irrigation of ears at home. Did provide ENT follow up PRN for cerumen impaction treatment if symptoms continue. Nothing on exam to suggest acute CVA. Patient does have some mild anemia worse than baseline but not near level requiring transfusion. Hemoccult negative. Plan for PCP follow up regarding this for further treatment/work up as an outpatient.  Discussed the outpatient follow up plan and impression with patient and family who are comfortable with the plan.   At this time, I do not feel there is any life-threatening condition present. I have reviewed and discussed all results (EKG, imaging, lab, urine as appropriate), exam findings with patient. I have reviewed nursing notes and appropriate previous records.  I feel the patient is safe to be discharged home without further emergent workup. Discussed usual and customary return precautions. Patient and family (if present) verbalize understanding and are comfortable with this plan.  Patient will follow-up with their primary care provider. If they do not have a primary care provider, information for follow-up has been provided to them. All questions have been answered.  ____________________________________________  FINAL CLINICAL IMPRESSION(S) / ED DIAGNOSES  Final diagnoses:  Generalized weakness  Bilateral  impacted cerumen  Anemia, unspecified type    MEDICATIONS GIVEN DURING THIS VISIT:  Medications  sodium chloride 0.9 % bolus 500 mL (0 mLs Intravenous Stopped 04/26/18 1202)    Note:  This document was prepared using Dragon voice recognition software and may include unintentional dictation errors.  Nanda Quinton, MD Emergency Medicine    Luby Seamans, [facility], MD 04/26/18 [phone]

## 2018-04-26 NOTE — ED Triage Notes (Signed)
Pt was taking prednisone last week for sciatica.  Reports she took it for 5 days and since being off the medication she has had generalized weakness, blurred vision, r ear stopped up and sob.

## 2018-04-26 NOTE — Discharge Instructions (Signed)
You were seen in the ED today with weakness. You do have some anemia which may be causing some of your symptoms. You do not require a blood transfusion at this time but you will need to call your PCP today or first thing tomorrow to discuss this result. If you decide to stop taking your fluid pill, do not stop for more that 4-5 days without discussing with your Cardiologist further. Return to the ED with any new or worsening symptoms.

## 2018-04-26 NOTE — ED Notes (Signed)
Bilateral ear irrigation complete. Small amount of wax returned from both ears. Dr Laverta Baltimore notified and states will refer her to ENT.

## 2018-04-26 NOTE — ED Notes (Signed)
Pt ambulated to restroom. 

## 2018-05-03 DIAGNOSIS — I10 Essential (primary) hypertension: Secondary | ICD-10-CM | POA: Diagnosis not present

## 2018-05-03 DIAGNOSIS — R531 Weakness: Secondary | ICD-10-CM | POA: Diagnosis not present

## 2018-05-03 DIAGNOSIS — E559 Vitamin D deficiency, unspecified: Secondary | ICD-10-CM | POA: Diagnosis not present

## 2018-05-03 DIAGNOSIS — R634 Abnormal weight loss: Secondary | ICD-10-CM | POA: Diagnosis not present

## 2018-05-03 DIAGNOSIS — E538 Deficiency of other specified B group vitamins: Secondary | ICD-10-CM | POA: Diagnosis not present

## 2018-05-03 DIAGNOSIS — F411 Generalized anxiety disorder: Secondary | ICD-10-CM | POA: Diagnosis not present

## 2018-05-03 DIAGNOSIS — D649 Anemia, unspecified: Secondary | ICD-10-CM | POA: Diagnosis not present

## 2018-05-05 DIAGNOSIS — C50212 Malignant neoplasm of upper-inner quadrant of left female breast: Secondary | ICD-10-CM | POA: Diagnosis not present

## 2018-05-12 ENCOUNTER — Telehealth: Payer: Self-pay | Admitting: Hematology and Oncology

## 2018-05-12 NOTE — Telephone Encounter (Signed)
Scheduled appt per 5/22 sch msg - left vm for pt re appts.

## 2018-05-18 ENCOUNTER — Inpatient Hospital Stay: Payer: Medicare Other

## 2018-05-18 ENCOUNTER — Inpatient Hospital Stay: Payer: Medicare Other | Attending: Hematology and Oncology | Admitting: Hematology and Oncology

## 2018-05-18 ENCOUNTER — Ambulatory Visit: Payer: Self-pay

## 2018-05-18 VITALS — BP 148/45 | HR 77 | Temp 98.3°F | Resp 17 | Ht 63.0 in | Wt 129.0 lb

## 2018-05-18 DIAGNOSIS — Z923 Personal history of irradiation: Secondary | ICD-10-CM | POA: Diagnosis not present

## 2018-05-18 DIAGNOSIS — D649 Anemia, unspecified: Secondary | ICD-10-CM

## 2018-05-18 DIAGNOSIS — C50212 Malignant neoplasm of upper-inner quadrant of left female breast: Secondary | ICD-10-CM | POA: Insufficient documentation

## 2018-05-18 DIAGNOSIS — F419 Anxiety disorder, unspecified: Secondary | ICD-10-CM | POA: Diagnosis not present

## 2018-05-18 DIAGNOSIS — Z79811 Long term (current) use of aromatase inhibitors: Secondary | ICD-10-CM | POA: Insufficient documentation

## 2018-05-18 DIAGNOSIS — X58XXXA Exposure to other specified factors, initial encounter: Secondary | ICD-10-CM | POA: Diagnosis not present

## 2018-05-18 DIAGNOSIS — Z9221 Personal history of antineoplastic chemotherapy: Secondary | ICD-10-CM

## 2018-05-18 DIAGNOSIS — E538 Deficiency of other specified B group vitamins: Secondary | ICD-10-CM | POA: Diagnosis not present

## 2018-05-18 DIAGNOSIS — T733XXA Exhaustion due to excessive exertion, initial encounter: Secondary | ICD-10-CM | POA: Insufficient documentation

## 2018-05-18 DIAGNOSIS — Z170 Estrogen receptor positive status [ER+]: Secondary | ICD-10-CM

## 2018-05-18 LAB — CBC WITH DIFFERENTIAL (CANCER CENTER ONLY)
Basophils Absolute: 0 10*3/uL (ref 0.0–0.1)
Basophils Relative: 0 %
EOS ABS: 0 10*3/uL (ref 0.0–0.5)
EOS PCT: 0 %
HCT: 33.6 % — ABNORMAL LOW (ref 34.8–46.6)
HEMOGLOBIN: 10.8 g/dL — AB (ref 11.6–15.9)
LYMPHS PCT: 16 %
Lymphs Abs: 1.1 10*3/uL (ref 0.9–3.3)
MCH: 28.9 pg (ref 25.1–34.0)
MCHC: 32.1 g/dL (ref 31.5–36.0)
MCV: 89.8 fL (ref 79.5–101.0)
MONOS PCT: 6 %
Monocytes Absolute: 0.4 10*3/uL (ref 0.1–0.9)
Neutro Abs: 5.2 10*3/uL (ref 1.5–6.5)
Neutrophils Relative %: 78 %
PLATELETS: 373 10*3/uL (ref 145–400)
RBC: 3.74 MIL/uL (ref 3.70–5.45)
RDW: 17 % — ABNORMAL HIGH (ref 11.2–14.5)
WBC Count: 6.7 10*3/uL (ref 3.9–10.3)

## 2018-05-18 LAB — RETICULOCYTES
RBC.: 3.74 MIL/uL (ref 3.70–5.45)
RETIC CT PCT: 3.7 % — AB (ref 0.7–2.1)
Retic Count, Absolute: 138.4 10*3/uL — ABNORMAL HIGH (ref 33.7–90.7)

## 2018-05-18 LAB — DIRECT ANTIGLOBULIN TEST (NOT AT ARMC)
DAT, COMPLEMENT: NEGATIVE
DAT, IgG: NEGATIVE

## 2018-05-18 LAB — VITAMIN B12: VITAMIN B 12: 538 pg/mL (ref 180–914)

## 2018-05-18 LAB — FOLATE: Folate: 20.2 ng/mL (ref >5.9)

## 2018-05-18 LAB — LACTATE DEHYDROGENASE: LDH: 192 U/L (ref 125–245)

## 2018-05-18 NOTE — Progress Notes (Signed)
Patient Care Team: Rosita Fire, MD as PCP - General (Internal Medicine) Nicholas Lose, MD as Consulting Physician (Hematology and Oncology) Kyung Rudd, MD as Consulting Physician (Radiation Oncology) Jovita Kussmaul, MD as Consulting Physician (General Surgery) Delice Bison Charlestine Massed, NP as Nurse Practitioner (Hematology and Oncology)  DIAGNOSIS:  Encounter Diagnoses  Name Primary?  . Malignant neoplasm of upper-inner quadrant of left breast in female, estrogen receptor positive ([place])   . Normocytic anemia Yes  . Fatigue due to excessive exertion, initial encounter   . Vitamin B 12 deficiency     SUMMARY OF ONCOLOGIC HISTORY:   Malignant neoplasm of upper-inner quadrant of left female breast ([place])   06/02/2016 Initial Diagnosis    Left breast biopsy 11:00 position: IDC with DCIS, ER 60%, PR 0%, Ki-67 15%, HER-2 negative ratio 1.4 to      06/17/2016 Surgery    Left lumpectomy: IDC grade 2, 2.4 cm, with intermediate grade DCIS, LVID present, margins negative, 0/4 lymph nodes positive, ER 60%, PR 0%, HER-2 positive ratio 2.21, Ki-67 15%, T2 N0 stage II a      07/17/2016 - 08/14/2016 Radiation Therapy    Adjuvant radiation therapy      07/21/2016 - 11/06/2016 Chemotherapy    Herceptin every 3 weeks 1 year along with anastrozole      12/09/2016 - 12/12/2016 Hospital Admission    Shortness of breath possibly pneumonia vs CHF       CHIEF COMPLIANT: Follow-up on anastrozole therapy  INTERVAL HISTORY: Charlotte [name] is a 84 year old lady with above-mentioned history of left breast cancer treated with lumpectomy radiation is currently on anastrozole therapy.  She has had problems with heart failure versus pneumonia.  Recent chest x-ray showed chronic interstitial changes on 04/26/2018.  She denies any problems from anastrozole therapy.  She was also diagnosed with normocytic anemia and was asked to see [place] for this condition.  REVIEW OF SYSTEMS:   Constitutional: Denies fevers,  chills or abnormal weight loss Eyes: Denies blurriness of vision Ears, nose, mouth, throat, and face: Denies mucositis or sore throat Respiratory: Shortness of breath to minimal exertion Cardiovascular: Denies palpitation, chest discomfort Gastrointestinal:  Denies nausea, heartburn or change in bowel habits Skin: Denies abnormal skin rashes Lymphatics: Denies new lymphadenopathy or easy bruising Neurological:Denies numbness, tingling or new weaknesses Behavioral/Psych: Mood is stable, no new changes  Extremities: No lower extremity edema Breast:  denies any pain or lumps or nodules in either breasts All other systems were reviewed with the patient and are negative.  I have reviewed the past medical history, past surgical history, social history and family history with the patient and they are unchanged from previous note.  ALLERGIES:  is allergic to latex; bextra [valdecoxib]; and motrin [ibuprofen].  MEDICATIONS:  Current Outpatient Medications  Medication Sig Dispense Refill  . anastrozole (ARIMIDEX) 1 MG tablet Take 0.5 tablets (0.5 mg total) by mouth daily. 90 tablet 1  . aspirin EC 81 MG tablet Take 81 mg by mouth daily.    . BELSOMRA 5 MG TABS Take 1 tablet by mouth daily.  1  . Calcium Carbonate-Vitamin D (CALCIUM 600+D3 PO) Take 1 tablet by mouth 2 (two) times daily.    . carbamide peroxide (DEBROX) 6.5 % otic solution Place 5 drops into both ears daily as needed (ear wax buildup).     . cycloSPORINE (RESTASIS) 0.05 % ophthalmic emulsion Place 1 drop into both eyes 2 (two) times daily.    . felodipine (PLENDIL) 10 MG 24  hr tablet Take 1 tablet (10 mg total) by mouth daily. 90 tablet 3  . fluticasone (FLONASE) 50 MCG/ACT nasal spray Place 2 sprays into the nose daily as needed for allergies.    . hydrALAZINE (APRESOLINE) 25 MG tablet Take 1 tablet (25 mg total) by mouth 3 (three) times daily. 270 tablet 3  . Liniments (SALONPAS PAIN RELIEF PATCH) PADS Apply 1 each topically  daily as needed (pain).    Marland Kitchen losartan (COZAAR) 100 MG tablet Take 100 mg by mouth daily.    . Omega-3 Fatty Acids (FISH OIL TRIPLE STRENGTH) 1400 MG CAPS Take 1 tablet by mouth daily.    Vladimir Faster Glycol-Propyl Glycol (SYSTANE) 0.4-0.3 % SOLN Apply 1 drop to eye 2 (two) times daily.    . potassium chloride SA (K-DUR,KLOR-CON) 20 MEQ tablet TAKE (1) TABLET BY MOUTH DAILY. 30 tablet 6  . sertraline (ZOLOFT) 50 MG tablet Take 50 mg by mouth daily.    Marland Kitchen spironolactone (ALDACTONE) 25 MG tablet TAKE ONE TABLET BY MOUTH ONCE DAILY. (Patient taking differently: TAKE ONE TABLET BY MOUTH EVERY OTHER DAILY.) 90 tablet 3  . tretinoin (RETIN-A) 0.025 % cream Apply 1 application topically at bedtime.   3   No current facility-administered medications for this visit.     PHYSICAL EXAMINATION: ECOG PERFORMANCE STATUS: 1 - Symptomatic but completely ambulatory  Vitals:   05/18/18 1455  BP: (!) 148/45  Pulse: 77  Resp: 17  Temp: 98.3 F (36.8 C)  SpO2: 97%   Filed Weights   05/18/18 1455  Weight: 129 lb (58.5 kg)    GENERAL:alert, no distress and comfortable SKIN: skin color, texture, turgor are normal, no rashes or significant lesions EYES: normal, Conjunctiva are pink and non-injected, sclera clear OROPHARYNX:no exudate, no erythema and lips, buccal mucosa, and tongue normal  NECK: supple, thyroid normal size, non-tender, without nodularity LYMPH:  no palpable lymphadenopathy in the cervical, axillary or inguinal LUNGS: clear to auscultation and percussion with normal breathing effort HEART: regular rate & rhythm and no murmurs and no lower extremity edema ABDOMEN:abdomen soft, non-tender and normal bowel sounds MUSCULOSKELETAL:no cyanosis of digits and no clubbing  NEURO: alert & oriented x 3 with fluent speech, no focal motor/sensory deficits EXTREMITIES: No lower extremity edema BREAST: No palpable masses or nodules in either right or left breasts. No palpable axillary supraclavicular  or infraclavicular adenopathy no breast tenderness or nipple discharge. (exam performed in the presence of a chaperone)  LABORATORY DATA:  I have reviewed the data as listed CMP Latest Ref Rng & Units 04/26/2018 01/20/2018 10/20/2017  Glucose 65 - 99 mg/dL 119(H) 106(H) 89  BUN 6 - 20 mg/dL _0 Creatinine 0.44 - 1.00 mg/dL 0.90 0.93(H) 0.91(H)  Sodium 135 - 145 mmol/L 131(L) 136 133(L)  Potassium 3.5 - 5.1 mmol/L 3.7 4.1 5.0  Chloride 101 - 111 mmol/L 93(L) 97(L) 97(L)  CO2 22 - 32 mmol/L 29 32 28  Calcium 8.9 - 10.3 mg/dL 9.5 10.2 9.9  Total Protein 6.5 - 8.1 g/dL 6.7 7.4 -  Total Bilirubin 0.3 - 1.2 mg/dL 0.7 0.5 -  Alkaline Phos 38 - 126 U/L 45 - -  AST 15 - 41 U/L 22 22 -  ALT 14 - 54 U/L 23 20 -    Lab Results  Component Value Date   WBC 6.7 05/18/2018   HGB 10.8 (L) 05/18/2018   HCT 33.6 (L) 05/18/2018   MCV 89.8 05/18/2018   PLT 373 05/18/2018   NEUTROABS  5.2 05/18/2018    ASSESSMENT & PLAN:  Malignant neoplasm of upper-inner quadrant of left female breast ([place]) Left lumpectomy 06/17/2016: IDC grade 2, 2.4 cm, with intermediate grade DCIS, LVID present, margins negative, 0/4 lymph nodes positive, ER 60%, PR 0%, HER-2 negative ratio 1.42, Ki-67 15%, T2 N0 stage II a Adjuvant radiation therapy started 07/17/2016 completed 08/14/2016  Treatment plan:  1. Adjuvant Herceptin every 3 weeks started 07/21/2016 discontinued on 11/03/2016 due to CHF. 2. Anastrozole started 09/01/2016 Patient's cardiologist Dr. Carlyle Dolly. Hospitalization Dec 2017 for SOB ? CHF vs Pneumonia; because of this we discontinued Herceptin 11/03/2016 -------------------------------------------------------------------------------------------------------------------------------- Current treatment: anastrozole 1 mg daily started 10/13/2016, Reduced dosage to half a tablet daily Monitoringclosely for toxicities.  Anastrozole toxicities: Denies any hot flashes or myalgias.  Breast cancer  surveillance: 1. Breast exam done 05/18/2018: Benign 2. mammogram 06/15/2017: Benign  Anxiety disorder: Currently on Zoloft.  Return to clinic in October at her previously scheduled time for follow-up    Normocytic anemia Differential diagnosis: Lead loss, iron deficiency and B12 and folate deficiencies, myeloma, bone marrow disorders, anemia of chronic disease  Work-up recommended: CBC with differential, iron studies, N98, folic acid, reticulocyte count, haptoglobin, LDH, SPEP Repeat hemoglobin today is 10.8, LDH is normal the rest of the work-up is pending I will call the patient with the results of this test tomorrow.  We will discuss the rest of the results when they come back.    Orders Placed This Encounter  Procedures  . Ferritin    Standing Status:   Future    Number of Occurrences:   1    Standing Expiration Date:   05/18/2019  . Iron and TIBC    Standing Status:   Future    Number of Occurrences:   1    Standing Expiration Date:   05/18/2019  . Reticulocytes    Standing Status:   Future    Number of Occurrences:   1    Standing Expiration Date:   05/19/2019  . CBC with Differential (Cancer Center Only)    Standing Status:   Future    Number of Occurrences:   1    Standing Expiration Date:   05/19/2019  . Lactate dehydrogenase (LDH)    Standing Status:   Future    Number of Occurrences:   1    Standing Expiration Date:   05/18/2019  . Multiple Myeloma Panel (SPEP&IFE w/QIG)    Standing Status:   Future    Number of Occurrences:   1    Standing Expiration Date:   06/22/2019  . Vitamin B12    Standing Status:   Future    Number of Occurrences:   1    Standing Expiration Date:   05/18/2019  . Folate, Serum    Standing Status:   Future    Number of Occurrences:   1    Standing Expiration Date:   05/18/2019  . Direct antiglobulin test ([facility])    Standing Status:   Future    Number of Occurrences:   1    Standing Expiration Date:   05/18/2019   The patient has a  good understanding of the overall plan. she agrees with it. she will call with any problems that may develop before the next visit here.   Harriette Ohara, MD 05/18/18

## 2018-05-18 NOTE — Assessment & Plan Note (Signed)
Left lumpectomy 06/17/2016: IDC grade 2, 2.4 cm, with intermediate grade DCIS, LVID present, margins negative, 0/4 lymph nodes positive, ER 60%, PR 0%, HER-2 negative ratio 1.42, Ki-67 15%, T2 N0 stage II a Adjuvant radiation therapy started 07/17/2016 completed 08/14/2016  Treatment plan:  1. Adjuvant Herceptin every 3 weeks started 07/21/2016 discontinued on 11/03/2016 due to CHF. 2. Anastrozole started 09/01/2016 Patient's cardiologist Dr. Carlyle Dolly. Hospitalization Dec 2017 for SOB ? CHF vs Pneumonia; because of this we discontinued Herceptin 11/03/2016 -------------------------------------------------------------------------------------------------------------------------------- Current treatment: anastrozole 1 mg daily started 10/13/2016, Reduced dosage to half a tablet daily Monitoringclosely for toxicities.  Anastrozole toxicities: Denies any hot flashes or myalgias.  Breast cancer surveillance: 1. Breast exam done 05/18/2018: Benign 2. mammogram 06/15/2017: Benign  Anxiety disorder: Currently on Zoloft.  Return to clinic in 1 year for follow-up

## 2018-05-18 NOTE — Assessment & Plan Note (Signed)
Differential diagnosis: Lead loss, iron deficiency and B12 and folate deficiencies, myeloma, bone marrow disorders  Work-up recommended: CBC with differential, iron studies, W11, folic acid, reticulocyte count, haptoglobin, LDH, SPEP  I will call the patient with the results of this test tomorrow.  We will discuss the rest of the results when they come back.

## 2018-05-19 ENCOUNTER — Telehealth: Payer: Self-pay | Admitting: *Deleted

## 2018-05-19 LAB — IRON AND TIBC
IRON: 234 ug/dL — AB (ref 41–142)
Saturation Ratios: 54 % (ref 21–57)
TIBC: 431 ug/dL (ref 236–444)
UIBC: 198 ug/dL

## 2018-05-19 LAB — FERRITIN: Ferritin: 26 ng/mL (ref 9–269)

## 2018-05-19 NOTE — Telephone Encounter (Signed)
Received call from pt asking about lab results.  Informed that anemia looked a little better & waiting on myeloma test.  Informed message to be sent to Dr Lindi Adie for any further comments.

## 2018-05-22 LAB — MULTIPLE MYELOMA PANEL, SERUM
ALPHA2 GLOB SERPL ELPH-MCNC: 0.9 g/dL (ref 0.4–1.0)
Albumin SerPl Elph-Mcnc: 4 g/dL (ref 2.9–4.4)
Albumin/Glob SerPl: 1.3 (ref 0.7–1.7)
Alpha 1: 0.2 g/dL (ref 0.0–0.4)
B-Globulin SerPl Elph-Mcnc: 1 g/dL (ref 0.7–1.3)
Gamma Glob SerPl Elph-Mcnc: 1 g/dL (ref 0.4–1.8)
Globulin, Total: 3.1 g/dL (ref 2.2–3.9)
IGM (IMMUNOGLOBULIN M), SRM: 132 mg/dL (ref 26–217)
IgA: 211 mg/dL (ref 64–422)
IgG (Immunoglobin G), Serum: 1115 mg/dL (ref 700–1600)
TOTAL PROTEIN ELP: 7.1 g/dL (ref 6.0–8.5)

## 2018-05-25 ENCOUNTER — Telehealth: Payer: Self-pay

## 2018-05-25 NOTE — Telephone Encounter (Signed)
Returned pt's call regarding her blood work from 5/29 and she would like to see if Dr Lindi Adie has got her lab results back.  I let her know that Dr Lindi Adie would review the results and the nurse or him will call her back.  No other needs per pt at this time.

## 2018-05-30 ENCOUNTER — Telehealth: Payer: Self-pay | Admitting: Hematology and Oncology

## 2018-05-30 NOTE — Telephone Encounter (Signed)
I informed the patient that her blood work showed an improvement in the hemoglobin up to 10.8 g. The remainder of the blood work including iron studies, H03, folic acid, serum protein electrophoresis, hemolysis all of which came back normal. Patient was happy to hear that. No immediate intervention necessary. I believe that the anemia is related to anemia of chronic disease.

## 2018-05-31 DIAGNOSIS — M5416 Radiculopathy, lumbar region: Secondary | ICD-10-CM | POA: Diagnosis not present

## 2018-05-31 DIAGNOSIS — I10 Essential (primary) hypertension: Secondary | ICD-10-CM | POA: Diagnosis not present

## 2018-06-03 ENCOUNTER — Other Ambulatory Visit: Payer: Self-pay | Admitting: Neurological Surgery

## 2018-06-03 ENCOUNTER — Other Ambulatory Visit: Payer: Self-pay | Admitting: Hematology and Oncology

## 2018-06-03 DIAGNOSIS — M5416 Radiculopathy, lumbar region: Secondary | ICD-10-CM

## 2018-06-03 DIAGNOSIS — Z1231 Encounter for screening mammogram for malignant neoplasm of breast: Secondary | ICD-10-CM

## 2018-06-07 ENCOUNTER — Other Ambulatory Visit (HOSPITAL_COMMUNITY): Payer: Self-pay | Admitting: Internal Medicine

## 2018-06-07 DIAGNOSIS — C50912 Malignant neoplasm of unspecified site of left female breast: Principal | ICD-10-CM

## 2018-06-07 DIAGNOSIS — C50911 Malignant neoplasm of unspecified site of right female breast: Secondary | ICD-10-CM

## 2018-06-16 ENCOUNTER — Ambulatory Visit
Admission: RE | Admit: 2018-06-16 | Discharge: 2018-06-16 | Disposition: A | Discharge status: Home / Self Care | Payer: Medicare Other | Source: Ambulatory Visit | Attending: Neurological Surgery | Admitting: Neurological Surgery

## 2018-06-16 DIAGNOSIS — M48061 Spinal stenosis, lumbar region without neurogenic claudication: Secondary | ICD-10-CM | POA: Diagnosis not present

## 2018-06-16 DIAGNOSIS — M5416 Radiculopathy, lumbar region: Secondary | ICD-10-CM

## 2018-06-20 DIAGNOSIS — M4316 Spondylolisthesis, lumbar region: Secondary | ICD-10-CM | POA: Diagnosis not present

## 2018-06-20 DIAGNOSIS — M48062 Spinal stenosis, lumbar region with neurogenic claudication: Secondary | ICD-10-CM | POA: Diagnosis not present

## 2018-06-20 DIAGNOSIS — Z681 Body mass index (BMI) 19 or less, adult: Secondary | ICD-10-CM | POA: Diagnosis not present

## 2018-06-20 DIAGNOSIS — I10 Essential (primary) hypertension: Secondary | ICD-10-CM | POA: Diagnosis not present

## 2018-06-21 ENCOUNTER — Encounter (HOSPITAL_COMMUNITY): Payer: Self-pay

## 2018-06-21 ENCOUNTER — Ambulatory Visit (HOSPITAL_COMMUNITY)
Admission: RE | Admit: 2018-06-21 | Discharge: 2018-06-21 | Disposition: A | Discharge status: Home / Self Care | Payer: Medicare Other | Source: Ambulatory Visit | Attending: Internal Medicine | Admitting: Internal Medicine

## 2018-06-21 DIAGNOSIS — Z853 Personal history of malignant neoplasm of breast: Secondary | ICD-10-CM | POA: Diagnosis not present

## 2018-06-21 DIAGNOSIS — R922 Inconclusive mammogram: Secondary | ICD-10-CM | POA: Diagnosis not present

## 2018-06-21 DIAGNOSIS — C50911 Malignant neoplasm of unspecified site of right female breast: Secondary | ICD-10-CM

## 2018-06-21 DIAGNOSIS — C50912 Malignant neoplasm of unspecified site of left female breast: Secondary | ICD-10-CM | POA: Diagnosis not present

## 2018-06-21 HISTORY — DX: Personal history of irradiation: Z92.3

## 2018-07-01 ENCOUNTER — Other Ambulatory Visit: Payer: Self-pay | Admitting: Cardiology

## 2018-07-14 DIAGNOSIS — M4316 Spondylolisthesis, lumbar region: Secondary | ICD-10-CM | POA: Diagnosis not present

## 2018-07-14 DIAGNOSIS — I10 Essential (primary) hypertension: Secondary | ICD-10-CM | POA: Diagnosis not present

## 2018-07-14 DIAGNOSIS — M48062 Spinal stenosis, lumbar region with neurogenic claudication: Secondary | ICD-10-CM | POA: Diagnosis not present

## 2018-07-14 DIAGNOSIS — M5416 Radiculopathy, lumbar region: Secondary | ICD-10-CM | POA: Diagnosis not present

## 2018-08-02 DIAGNOSIS — M48062 Spinal stenosis, lumbar region with neurogenic claudication: Secondary | ICD-10-CM | POA: Diagnosis not present

## 2018-08-02 DIAGNOSIS — F324 Major depressive disorder, single episode, in partial remission: Secondary | ICD-10-CM | POA: Diagnosis not present

## 2018-08-02 DIAGNOSIS — M5416 Radiculopathy, lumbar region: Secondary | ICD-10-CM | POA: Diagnosis not present

## 2018-08-10 DIAGNOSIS — H04123 Dry eye syndrome of bilateral lacrimal glands: Secondary | ICD-10-CM | POA: Diagnosis not present

## 2018-08-10 DIAGNOSIS — Z961 Presence of intraocular lens: Secondary | ICD-10-CM | POA: Diagnosis not present

## 2018-08-19 DIAGNOSIS — M5416 Radiculopathy, lumbar region: Secondary | ICD-10-CM | POA: Diagnosis not present

## 2018-08-19 DIAGNOSIS — M48062 Spinal stenosis, lumbar region with neurogenic claudication: Secondary | ICD-10-CM | POA: Diagnosis not present

## 2018-09-06 DIAGNOSIS — R300 Dysuria: Secondary | ICD-10-CM | POA: Diagnosis not present

## 2018-09-06 DIAGNOSIS — M1990 Unspecified osteoarthritis, unspecified site: Secondary | ICD-10-CM | POA: Diagnosis not present

## 2018-09-06 DIAGNOSIS — D649 Anemia, unspecified: Secondary | ICD-10-CM | POA: Diagnosis not present

## 2018-09-06 DIAGNOSIS — Z1331 Encounter for screening for depression: Secondary | ICD-10-CM | POA: Diagnosis not present

## 2018-09-06 DIAGNOSIS — I10 Essential (primary) hypertension: Secondary | ICD-10-CM | POA: Diagnosis not present

## 2018-09-06 DIAGNOSIS — Z1389 Encounter for screening for other disorder: Secondary | ICD-10-CM | POA: Diagnosis not present

## 2018-09-06 DIAGNOSIS — F339 Major depressive disorder, recurrent, unspecified: Secondary | ICD-10-CM | POA: Diagnosis not present

## 2018-09-06 DIAGNOSIS — G4733 Obstructive sleep apnea (adult) (pediatric): Secondary | ICD-10-CM | POA: Diagnosis not present

## 2018-09-06 DIAGNOSIS — R8290 Unspecified abnormal findings in urine: Secondary | ICD-10-CM | POA: Diagnosis not present

## 2018-09-06 DIAGNOSIS — R634 Abnormal weight loss: Secondary | ICD-10-CM | POA: Diagnosis not present

## 2018-09-06 DIAGNOSIS — M609 Myositis, unspecified: Secondary | ICD-10-CM | POA: Diagnosis not present

## 2018-09-06 DIAGNOSIS — E559 Vitamin D deficiency, unspecified: Secondary | ICD-10-CM | POA: Diagnosis not present

## 2018-09-06 DIAGNOSIS — C50912 Malignant neoplasm of unspecified site of left female breast: Secondary | ICD-10-CM | POA: Diagnosis not present

## 2018-09-06 DIAGNOSIS — R42 Dizziness and giddiness: Secondary | ICD-10-CM | POA: Diagnosis not present

## 2018-09-06 DIAGNOSIS — Z0000 Encounter for general adult medical examination without abnormal findings: Secondary | ICD-10-CM | POA: Diagnosis not present

## 2018-09-06 DIAGNOSIS — E538 Deficiency of other specified B group vitamins: Secondary | ICD-10-CM | POA: Diagnosis not present

## 2018-09-06 DIAGNOSIS — R7309 Other abnormal glucose: Secondary | ICD-10-CM | POA: Diagnosis not present

## 2018-09-07 DIAGNOSIS — M5416 Radiculopathy, lumbar region: Secondary | ICD-10-CM | POA: Diagnosis not present

## 2018-09-07 DIAGNOSIS — M48062 Spinal stenosis, lumbar region with neurogenic claudication: Secondary | ICD-10-CM | POA: Diagnosis not present

## 2018-09-16 ENCOUNTER — Ambulatory Visit: Payer: Medicare Other | Admitting: Nurse Practitioner

## 2018-09-18 ENCOUNTER — Encounter: Payer: Self-pay | Admitting: Nurse Practitioner

## 2018-09-19 NOTE — Progress Notes (Signed)
[facility]  PATIENT: ABYGAIL GALENO DOB: 1928/11/14  REASON FOR VISIT Obstructive sleep apnea with follow-up CPAP compliance HISTORY FROM: Patient and daughter    HISTORY OF PRESENT ILLNESS:Corby C Yera is a 84 y.o. female , seen here as a referral  from Dr. Legrand Rams for a sleep consultation,   ON July 27th of this year a breast cancer was finally identified as cause of her discomfort, fatigue, and  insomnia, she had a whole year of anxiety , unexplained weight loss, and illness- poorly defined.  This is per patients report.  Mrs. Takaki had visited the local emergency room in [place] before many times and always was sent home without a diagnosis. She underwent a mammogram which did not indicate breast cancer but turned out that her breast cancer was difficult to find as it was located in the very outer upper quadrant of the left breast. In July she underwent surgery and now just begun chemotherapy. She has also a Port-A-Cath implanted into the right chest for ongoing chemotherapy. Mrs. Lunden also had suffered from another breast cancer  Before-  she underwent a lumpectomy of the right breast in 2001. She has echocardiogram every 3 month while on Chemotherapy. She feels excessively sleepy since being on chemotherapy and after radiation. Her doctors feel that she should be able to regain her appetite and alongside get some energy and enjoy her life. She feels that her insomnia and the frequent fragmentation of her sleep is a hindrance. She has anxiety and can only sleep 2-4 hours at a time and the rest of the night she spends pacing the halls. She is very anxious and her daughter describes her as almost jittery, restless, driven and pressured, to a frantic degree. She went to a psychologist to evaluate her anxiety and insomnia, but supposedly was told that she doesn't have to return and doesn't need psychiatric or psychological help. She saw Dr. Harrington Challenger in [place]. She is now  here to rule out organic causes for Insomnia.   Sleep habits are as follows:She usually takes a Tylenol PM by 10:00 but often doesn't feel that she is tired enough to go to sleep or even to retreat. She watches TV at night and sometimes may spend all day in the bedroom. She blames her fatigue for not being able to rise and do a lot of activities these days. She lives in her bedroom. This has affected her circadian rhythm and days and nights have started to look alike. Sleep can be distributed all over the day and short naps but at night she struggles to get to a continuous sustain sleep pattern. She has nocturia 3-4 times each night. She is unable to tell me a rise time -  7.30 the earliest, and always up because she needs to take medication- but goes back to bed. She may not get out of bed until 12 noon.  She also likes to read from the I-Pad while in the bedroom,  which is another screen, she likes to watch TV which is screen light penetrating the bedroom. We have discussed this today in our meeting with her daughter, who is here today. She doesn't leave the house, very rarely exposes herself to day light. She cannot muster the energy to get up in AM,   I would like for her psychologist to discuss sleep hygiene rules with her as  I will also do today.  Sleep medical history and family sleep history:  No known apnea in  the family.  Social history: married , lives with her husband , who still drives.   Interval history from 03/15/2017. Mrs. Kaleb Linquist is here today to follow-up on her recent sleep study. She states that she still has shortness of breath, bouts of insomnia and anxiety at night. She also tends to be agitated all day long. As her daughter tells me.  She takes twice a day Ativan usually one at night to allow her to go to sleep at all, another one in the morning when she wakes up very anxious but not daily. Ativan as prescribed by her primary care physician. I would like for the patient  to be on an SSRI such as Zoloft to help with the underlying agitation and anxiety rather than take Ativan at her age.  The patient was here to be evaluated for organic reasons of insomnia and tested in a sleep study on 11/25/2016 positive for sleep apnea. Her AHI was 16.1, her RDI was 21.4 indicating that she is a loud snorer. She slept all night in nonsupine position and he did not reach REM sleep. There was a prolonged oxygen desaturation time of 77 minutes noted but no periodic limb movements and isolated premature ventricular contractions were noted that she stayed mainly in sinus rhythm. I have followed this sleep study with a CPAP titration dated 12/29/2016, which revealed an AHI of 1.0 the patient was titrated to 11 cm water with a full face mask.  A [employer]  simplus fullface mask in medium size was used and an auto titration between 6 and 12 cm water was prescribed with 2 cm EPR.The machine was to be BiPAP compatible if the need arises. UPDATE 09/26/2018CM Ms. Devilla, 84 year old female returns for follow-up history of obstructive sleep apnea using CPAP. Compliance data dated 08/15/2017 - 9 /24/ 2018 shows 100% compliance greater than 4 hours for 30 days. Average usage 8 hours 6 minutes. BiPAP pressure 6-12 cm. EPR level 2. AHI 1.1.Ess 3. She was recently seen in the emergency room for shortness of breath. No changes to her medication except for increase in her blood pressure medication which she did not take this morning and blood pressure noted to be elevated. She was encouraged to take her medication as she gets home. She returns for reevaluation UPDATE 10/1/2019CM Ms. Ebel, 84 year old female returns for follow-up with history of obstructive sleep apnea using CPAP.  She has nasal pillows.  She denies any difficulty with using her's machine.  Compliance data dated 08/20/2018-09/18/2018 shows compliance greater than 4 hours at 100%.  Average usage 8 hours 24 minutes EPR level 2.  AHI 0.8.   ESS 3.  She returns for reevaluation REVIEW OF SYSTEMS: Full 14 system review of systems performed and notable only for those listed, all others are neg:  Constitutional: neg  Cardiovascular: neg Ear/Nose/Throat: neg  Skin: neg Eyes: neg Respiratory: neg Gastroitestinal: neg  Hematology/Lymphatic: Easy bruising Endocrine: neg Musculoskeletal: Back pain walking difficulty Allergy/Immunology: neg Neurological: neg Psychiatric: Depression and anxiety Sleep : Obstructive sleep apnea with CPAP ALLERGIES: Allergies  Allergen Reactions  . Latex Swelling    SEVERITY OF SWELLING NOT DEFINED.  Marland Kitchen Bextra [Valdecoxib] Other (See Comments)    Stomach Pains  . Motrin [Ibuprofen] Other (See Comments)    Stomach pain    HOME MEDICATIONS: Outpatient Medications Prior to Visit  Medication Sig Dispense Refill  . anastrozole (ARIMIDEX) 1 MG tablet Take 0.5 tablets (0.5 mg total) by mouth daily. 90 tablet 1  .  aspirin EC 81 MG tablet Take 81 mg by mouth daily.    . BELSOMRA 5 MG TABS Take 1 tablet by mouth daily.  1  . Calcium Carbonate-Vitamin D (CALCIUM 600+D3 PO) Take 1 tablet by mouth 2 (two) times daily.    . carbamide peroxide (DEBROX) 6.5 % otic solution Place 5 drops into both ears daily as needed (ear wax buildup).     . cycloSPORINE (RESTASIS) 0.05 % ophthalmic emulsion Place 1 drop into both eyes 2 (two) times daily.    . felodipine (PLENDIL) 10 MG 24 hr tablet Take 1 tablet (10 mg total) by mouth daily. 90 tablet 3  . felodipine (PLENDIL) 10 MG 24 hr tablet TAKE ONE TABLET BY MOUTH ONCE DAILY. 90 tablet 0  . fluticasone (FLONASE) 50 MCG/ACT nasal spray Place 2 sprays into the nose daily as needed for allergies.    . hydrALAZINE (APRESOLINE) 25 MG tablet Take 1 tablet (25 mg total) by mouth 3 (three) times daily. 270 tablet 3  . Liniments (SALONPAS PAIN RELIEF PATCH) PADS Apply 1 each topically daily as needed (pain).    Marland Kitchen losartan (COZAAR) 100 MG tablet Take 100 mg by mouth daily.      . Omega-3 Fatty Acids (FISH OIL TRIPLE STRENGTH) 1400 MG CAPS Take 1 tablet by mouth daily.    Vladimir Faster Glycol-Propyl Glycol (SYSTANE) 0.4-0.3 % SOLN Apply 1 drop to eye 2 (two) times daily.    . potassium chloride SA (K-DUR,KLOR-CON) 20 MEQ tablet TAKE (1) TABLET BY MOUTH DAILY. 30 tablet 6  . sertraline (ZOLOFT) 50 MG tablet Take 50 mg by mouth daily.    Marland Kitchen spironolactone (ALDACTONE) 25 MG tablet TAKE ONE TABLET BY MOUTH ONCE DAILY. (Patient taking differently: TAKE ONE TABLET BY MOUTH EVERY OTHER DAILY.) 90 tablet 3  . tretinoin (RETIN-A) 0.025 % cream Apply 1 application topically at bedtime.   3   No facility-administered medications prior to visit.     PAST MEDICAL HISTORY: Past Medical History:  Diagnosis Date  . Anxiety   . Arthritis   . Back pain   . Cancer ([place])    breast cancer x 2  . Depression   . Fatigue   . History of hyperthyroidism    resolved, no medicaions for treatment at this time  . Hypertension   . Insomnia   . OSA (obstructive sleep apnea)   . Personal history of radiation therapy 2017  . Pulmonary hypertension ([place])     PAST SURGICAL HISTORY: Past Surgical History:  Procedure Laterality Date  . BREAST LUMPECTOMY     right side  . BREAST LUMPECTOMY WITH AXILLARY LYMPH NODE BIOPSY Left 06/17/2016   Procedure: BREAST LUMPECTOMY WITH AXILLARY LYMPH NODE BIOPSY;  Surgeon: Autumn Messing III, MD;  Location: [facility];  Service: General;  Laterality: Left;  . COLONOSCOPY    . DILATION AND CURETTAGE OF UTERUS    . EYE SURGERY     bilateral cataracts removed  . FOOT SURGERY    . PORTACATH PLACEMENT Right 07/16/2016   Procedure: INSERTION PORT-A-CATH RIGHT SUBLCLAVIAN;  Surgeon: Autumn Messing III, MD;  Location: [facility];  Service: General;  Laterality: Right;  . TONSILLECTOMY      FAMILY HISTORY: Family History  Problem Relation Age of Onset  . Heart attack Mother   . Cancer Mother   . Heart disease Mother   . Hypertension Other   . Cancer Sister   . Heart  disease Maternal Grandmother   . Lung disease  Neg Hx   . Rheumatologic disease Neg Hx     SOCIAL HISTORY: Social History   Socioeconomic History  . Marital status: Married    Spouse name: Not on file  . Number of children: Not on file  . Years of education: Not on file  . Highest education level: Not on file  Occupational History  . Not on file  Social Needs  . Financial resource strain: Not on file  . Food insecurity:    Worry: Not on file    Inability: Not on file  . Transportation needs:    Medical: Not on file    Non-medical: Not on file  Tobacco Use  . Smoking status: Passive Smoke Exposure - Never Smoker  . Smokeless tobacco: Never Used  . Tobacco comment: Husband smoked  Substance and Sexual Activity  . Alcohol use: No    Alcohol/week: 0.0 standard drinks  . Drug use: No  . Sexual activity: Not Currently  Lifestyle  . Physical activity:    Days per week: Not on file    Minutes per session: Not on file  . Stress: Not on file  Relationships  . Social connections:    Talks on phone: Not on file    Gets together: Not on file    Attends religious service: Not on file    Active member of club or organization: Not on file    Attends meetings of clubs or organizations: Not on file    Relationship status: Not on file  . Intimate partner violence:    Fear of current or ex partner: Not on file    Emotionally abused: Not on file    Physically abused: Not on file    Forced sexual activity: Not on file  Other Topics Concern  . 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 Originally from [place]. Lived for years in [place]. Moved back to [place] in 1991. Has also lived in [place]. No pets currently. No bird or mold exposure.     PHYSICAL EXAM  Vitals:   09/15/17 1052  BP: (!) 180/80   Pulse: 73  Weight: 123 lb 3.2 oz  (55.9 kg)   There is no height or weight on file to calculate BMI.  Generalized: Well developed, in no acute distress  Head: normocephalic and atraumatic,. Oropharynx benign mallopatti4 Neck: Supple, circumference 15 Lungs clear Musculoskeletal: No deformity  Skin no rash or edema  Neurological examination   Mentation: Alert oriented to time, place, history taking. Attention span and concentration appropriate. Recent and remote memory intact.  Follows all commands speech and language fluent. ESS 3  Cranial nerve II-XII: Pupils were equal round reactive to light extraocular movements were full, visual field were full on confrontational test. Facial sensation and strength were normal. hearing was intact to finger rubbing bilaterally. Uvula tongue midline. head turning and shoulder shrug were normal and symmetric.Tongue protrusion into cheek strength was normal. Motor: normal bulk and tone, full strength in the BUE, BLE,  Sensory: normal and symmetric to light touch, on the face arms and legs Coordination: finger-nose-finger, heel-to-shin bilaterally, no dysmetria Gait and Station: Rising up from seated position without assistance, normal stance,  moderate stride,  smooth turning, ambulates with single-point cane  DIAGNOSTIC DATA (LABS, IMAGING, TESTING) - I reviewed patient records, labs, notes, testing and imaging myself where available.  Lab Results  Component Value Date   WBC 6.7 05/18/2018   HGB 10.8 (L) 05/18/2018   HCT 33.6 (L) 05/18/2018   MCV 89.8 05/18/2018   PLT 373 05/18/2018      Component Value Date/Time   NA 131 (L) 04/26/2018 1005   NA 133 (L) 01/05/2017 1334   K 3.7 04/26/2018 1005   K 4.0 01/05/2017 1334   CL 93 (L) 04/26/2018 1005   CO2 29 04/26/2018 1005   CO2 29 01/05/2017 1334   GLUCOSE 119 (H) 04/26/2018 1005   GLUCOSE 90 01/05/2017 1334   BUN 16 04/26/2018 1005   BUN 17.1 01/05/2017 1334   CREATININE 0.90 04/26/2018 1005   CREATININE 0.93 (H)  01/20/2018 0917   CREATININE 1.0 01/05/2017 1334   CALCIUM 9.5 04/26/2018 1005   CALCIUM 10.2 01/05/2017 1334   PROT 6.7 04/26/2018 1005   PROT 7.6 01/05/2017 1334   ALBUMIN 3.8 04/26/2018 1005   ALBUMIN 3.7 01/05/2017 1334   AST 22 04/26/2018 1005   AST 19 01/05/2017 1334   ALT 23 04/26/2018 1005   ALT 15 01/05/2017 1334   ALKPHOS 45 04/26/2018 1005   ALKPHOS 69 01/05/2017 1334   BILITOT 0.7 04/26/2018 1005   BILITOT 0.34 01/05/2017 1334   GFRNONAA 56 (L) 04/26/2018 1005   GFRAA >60 04/26/2018 1005    ASSESSMENT AND PLAN Mrs. Pownall 84 year old has  sleep apnea which has been well treated on CPAP titration with AutoSet between 6 and 12 cm water.Compliance data dated 08/20/2018-09/18/2018 shows compliance greater than 4 hours at 100%.  Average usage 8 hours 24 minutes EPR level 2.  AHI 0.8.  ESS 3.   Plan: CPAP compliance 100% reviewed data with patient and daughter Continue same settings  follow up yearly I spent 15 minutes in total face to face time with the patient more than 50% of which was spent counseling and coordination of care, reviewing test results reviewing medications and discussing and reviewing the diagnosis of obstructive sleep apnea and compliance with CPAP which is excellent today Dennie Bible, [facility], [facility], APRN  [facility] [address], [place], [place] [address] [phone]

## 2018-09-20 ENCOUNTER — Ambulatory Visit: Payer: Medicare Other | Admitting: Nurse Practitioner

## 2018-09-20 ENCOUNTER — Encounter: Payer: Self-pay | Admitting: Nurse Practitioner

## 2018-09-20 VITALS — BP 172/64 | HR 66 | Ht 63.0 in | Wt 125.6 lb

## 2018-09-20 DIAGNOSIS — G4733 Obstructive sleep apnea (adult) (pediatric): Secondary | ICD-10-CM

## 2018-09-20 NOTE — Patient Instructions (Signed)
CPAP compliance 100%  Continue same settings  follow up yearly

## 2018-09-24 DIAGNOSIS — Z23 Encounter for immunization: Secondary | ICD-10-CM | POA: Diagnosis not present

## 2018-09-29 ENCOUNTER — Telehealth: Payer: Self-pay | Admitting: Hematology and Oncology

## 2018-09-29 ENCOUNTER — Inpatient Hospital Stay: Payer: Medicare Other | Attending: Hematology and Oncology | Admitting: Hematology and Oncology

## 2018-09-29 DIAGNOSIS — Z170 Estrogen receptor positive status [ER+]: Secondary | ICD-10-CM | POA: Insufficient documentation

## 2018-09-29 DIAGNOSIS — F419 Anxiety disorder, unspecified: Secondary | ICD-10-CM | POA: Insufficient documentation

## 2018-09-29 DIAGNOSIS — C50212 Malignant neoplasm of upper-inner quadrant of left female breast: Secondary | ICD-10-CM | POA: Insufficient documentation

## 2018-09-29 DIAGNOSIS — M5430 Sciatica, unspecified side: Secondary | ICD-10-CM | POA: Insufficient documentation

## 2018-09-29 DIAGNOSIS — Z79811 Long term (current) use of aromatase inhibitors: Secondary | ICD-10-CM | POA: Diagnosis not present

## 2018-09-29 DIAGNOSIS — D649 Anemia, unspecified: Secondary | ICD-10-CM | POA: Insufficient documentation

## 2018-09-29 MED ORDER — ANASTROZOLE 1 MG PO TABS: 0.5000 mg | ORAL_TABLET | Freq: Every day | ORAL | 3 refills | Status: AC

## 2018-09-29 NOTE — Telephone Encounter (Signed)
Gave pt avs and calendar  °

## 2018-09-29 NOTE — Assessment & Plan Note (Signed)
Blood work including iron studies, T19, folic acid, serum protein electrophoresis, hemolysis: Normal. Most likely patient has anemia of chronic disease  Lab review: 09/29/2018:

## 2018-09-29 NOTE — Assessment & Plan Note (Signed)
Left lumpectomy 06/17/2016: IDC grade 2, 2.4 cm, with intermediate grade DCIS, LVID present, margins negative, 0/4 lymph nodes positive, ER 60%, PR 0%, HER-2 negative ratio 1.42, Ki-67 15%, T2 N0 stage II a Adjuvant radiation therapy started 07/17/2016 completed 08/14/2016  Treatment plan:  1. Adjuvant Herceptin every 3 weeks started 07/21/2016 discontinued on 11/03/2016 due to CHF. 2. Anastrozole started 09/01/2016 Patient's cardiologist Dr. Carlyle Dolly. Hospitalization Dec 2017 for SOB ? CHF vs Pneumonia; because of this we discontinued Herceptin 11/03/2016 -------------------------------------------------------------------------------------------------------------------------------- Current treatment: anastrozole 1 mg daily started 10/13/2016, Reduced dosage to half a tablet daily Monitoringclosely for toxicities.  Anastrozole toxicities: Denies any hot flashes or myalgias.  Breast cancer surveillance: 1.Breast exam 09/29/2018: Benign 2.mammogram 06/21/2018: Benign breast density category C  Anxiety disorder: Currently on Zoloft.

## 2018-09-29 NOTE — Progress Notes (Signed)
 Patient Care Team: Fanta, Tesfaye, MD as PCP - General (Internal Medicine) Gudena, Vinay, MD as Consulting Physician (Hematology and Oncology) Moody, John, MD as Consulting Physician (Radiation Oncology) Toth, Paul III, MD as Consulting Physician (General Surgery) Causey, Lindsey Cornetto, NP as Nurse Practitioner (Hematology and Oncology)  DIAGNOSIS:  Encounter Diagnoses  Name Primary?  . Malignant neoplasm of upper-inner quadrant of left breast in female, estrogen receptor positive (HCC)   . Normocytic anemia     SUMMARY OF ONCOLOGIC HISTORY:   Malignant neoplasm of upper-inner quadrant of left female breast (HCC)   06/02/2016 Initial Diagnosis    Left breast biopsy 11:00 position: IDC with DCIS, ER 60%, PR 0%, Ki-67 15%, HER-2 negative ratio 1.4 to    06/17/2016 Surgery    Left lumpectomy: IDC grade 2, 2.4 cm, with intermediate grade DCIS, LVID present, margins negative, 0/4 lymph nodes positive, ER 60%, PR 0%, HER-2 positive ratio 2.21, Ki-67 15%, T2 N0 stage II a    07/17/2016 - 08/14/2016 Radiation Therapy    Adjuvant radiation therapy    07/21/2016 - 11/06/2016 Chemotherapy    Herceptin every 3 weeks 1 year along with anastrozole    12/09/2016 - 12/12/2016 Hospital Admission    Shortness of breath possibly pneumonia vs CHF     CHIEF COMPLIANT: Surveillance of breast cancer, complaining of sciatic pain  INTERVAL HISTORY: Charlotte [name] is a 84-year-old with above-mentioned history of left breast cancer underwent lumpectomy radiation and the received Herceptin and is currently on anastrozole.  She is tolerating anastrozole extremely well.  She is complaining of sciatica pain for which she is seeing orthopedics.  Previously she was also anemic and we had worked up her anemia and felt that it was anemia of chronic disease.  She tells me that her primary care has been checking her hemoglobin and that she has been doing fine.  We do not have copies of the recent blood work at her  PCP.  REVIEW OF SYSTEMS:   Constitutional: Denies fevers, chills or abnormal weight loss Eyes: Denies blurriness of vision Ears, nose, mouth, throat, and face: Denies mucositis or sore throat Respiratory: Denies cough, dyspnea or wheezes Cardiovascular: Denies palpitation, chest discomfort Gastrointestinal:  Denies nausea, heartburn or change in bowel habits Skin: Denies abnormal skin rashes Lymphatics: Denies new lymphadenopathy or easy bruising Neurological:Denies numbness, tingling or new weaknesses Behavioral/Psych: Mood is stable, no new changes  Extremities: Left leg pain due to sciatica Breast:  denies any pain or lumps or nodules in either breasts All other systems were reviewed with the patient and are negative.  I have reviewed the past medical history, past surgical history, social history and family history with the patient and they are unchanged from previous note.  ALLERGIES:  is allergic to latex; bextra [valdecoxib]; and motrin [ibuprofen].  MEDICATIONS:  Current Outpatient Medications  Medication Sig Dispense Refill  . anastrozole (ARIMIDEX) 1 MG tablet Take 0.5 tablets (0.5 mg total) by mouth daily. 90 tablet 1  . aspirin EC 81 MG tablet Take 81 mg by mouth daily.    . BELSOMRA 5 MG TABS Take 1 tablet by mouth daily.  1  . Calcium Carbonate-Vitamin D (CALCIUM 600+D3 PO) Take 1 tablet by mouth 2 (two) times daily.    . carbamide peroxide (DEBROX) 6.5 % otic solution Place 5 drops into both ears daily as needed (ear wax buildup).     . cycloSPORINE (RESTASIS) 0.05 % ophthalmic emulsion Place 1 drop into both eyes 2 (two)   times daily.    . felodipine (PLENDIL) 10 MG 24 hr tablet Take 1 tablet (10 mg total) by mouth daily. 90 tablet 3  . fluticasone (FLONASE) 50 MCG/ACT nasal spray Place 2 sprays into the nose daily as needed for allergies.    . hydrALAZINE (APRESOLINE) 25 MG tablet Take 1 tablet (25 mg total) by mouth 3 (three) times daily. 270 tablet 3  . Liniments  (SALONPAS PAIN RELIEF PATCH) PADS Apply 1 each topically daily as needed (pain).    Marland Kitchen losartan (COZAAR) 100 MG tablet Take 100 mg by mouth daily.    . Omega-3 Fatty Acids (FISH OIL TRIPLE STRENGTH) 1400 MG CAPS Take 1 tablet by mouth daily.    Vladimir Faster Glycol-Propyl Glycol (SYSTANE) 0.4-0.3 % SOLN Apply 1 drop to eye 2 (two) times daily.    . potassium chloride SA (K-DUR,KLOR-CON) 20 MEQ tablet TAKE (1) TABLET BY MOUTH DAILY. 30 tablet 6  . sertraline (ZOLOFT) 50 MG tablet Take 50 mg by mouth daily.    Marland Kitchen spironolactone (ALDACTONE) 25 MG tablet TAKE ONE TABLET BY MOUTH ONCE DAILY. (Patient taking differently: 12.5 mg. Every other day.) 90 tablet 3  . tretinoin (RETIN-A) 0.025 % cream Apply 1 application topically at bedtime.   3   No current facility-administered medications for this visit.     PHYSICAL EXAMINATION: ECOG PERFORMANCE STATUS: 2 - Symptomatic, <50% confined to bed  Vitals:   09/29/18 1334  BP: (!) 159/52  Pulse: 80  Resp: 17  Temp: 98.6 F (37 C)  SpO2: 97%   Filed Weights   09/29/18 1334  Weight: 127 lb 4.8 oz (57.7 kg)    GENERAL:alert, no distress and comfortable SKIN: skin color, texture, turgor are normal, no rashes or significant lesions EYES: normal, Conjunctiva are pink and non-injected, sclera clear OROPHARYNX:no exudate, no erythema and lips, buccal mucosa, and tongue normal  NECK: supple, thyroid normal size, non-tender, without nodularity LYMPH:  no palpable lymphadenopathy in the cervical, axillary or inguinal LUNGS: clear to auscultation and percussion with normal breathing effort HEART: regular rate & rhythm and no murmurs and no lower extremity edema ABDOMEN:abdomen soft, non-tender and normal bowel sounds MUSCULOSKELETAL:no cyanosis of digits and no clubbing  NEURO: alert & oriented x 3 with fluent speech, no focal motor/sensory deficits EXTREMITIES: No lower extremity edema BREAST: No palpable masses or nodules in either right or left  breasts. No palpable axillary supraclavicular or infraclavicular adenopathy no breast tenderness or nipple discharge. (exam performed in the presence of a chaperone)  LABORATORY DATA:  I have reviewed the data as listed CMP Latest Ref Rng & Units 04/26/2018 01/20/2018 10/20/2017  Glucose 65 - 99 mg/dL 119(H) 106(H) 89  BUN 6 - 20 mg/dL _0 Creatinine 0.44 - 1.00 mg/dL 0.90 0.93(H) 0.91(H)  Sodium 135 - 145 mmol/L 131(L) 136 133(L)  Potassium 3.5 - 5.1 mmol/L 3.7 4.1 5.0  Chloride 101 - 111 mmol/L 93(L) 97(L) 97(L)  CO2 22 - 32 mmol/L 29 32 28  Calcium 8.9 - 10.3 mg/dL 9.5 10.2 9.9  Total Protein 6.5 - 8.1 g/dL 6.7 7.4 -  Total Bilirubin 0.3 - 1.2 mg/dL 0.7 0.5 -  Alkaline Phos 38 - 126 U/L 45 - -  AST 15 - 41 U/L 22 22 -  ALT 14 - 54 U/L 23 20 -    Lab Results  Component Value Date   WBC 6.7 05/18/2018   HGB 10.8 (L) 05/18/2018   HCT 33.6 (L) 05/18/2018  MCV 89.8 05/18/2018   PLT 373 05/18/2018   NEUTROABS 5.2 05/18/2018    ASSESSMENT & PLAN:  Malignant neoplasm of upper-inner quadrant of left female breast ([place]) Left lumpectomy 06/17/2016: IDC grade 2, 2.4 cm, with intermediate grade DCIS, LVID present, margins negative, 0/4 lymph nodes positive, ER 60%, PR 0%, HER-2 negative ratio 1.42, Ki-67 15%, T2 N0 stage II a Adjuvant radiation therapy started 07/17/2016 completed 08/14/2016  Treatment plan:  1. Adjuvant Herceptin every 3 weeks started 07/21/2016 discontinued on 11/03/2016 due to CHF. 2. Anastrozole started 09/01/2016 Patient's cardiologist Dr. Carlyle Dolly. Hospitalization Dec 2017 for SOB ? CHF vs Pneumonia; because of this we discontinued Herceptin 11/03/2016 -------------------------------------------------------------------------------------------------------------------------------- Current treatment: anastrozole 1 mg daily started 10/13/2016, Reduced dosage to half a tablet daily Monitoringclosely for toxicities.  Anastrozole toxicities: Denies  any hot flashes or myalgias.  Breast cancer surveillance: 1.Breast exam 09/29/2018: Benign 2.mammogram 06/21/2018: Benign breast density category C  Anxiety disorder: Currently on Zoloft. Sciatica: Seeing orthopedics  Normocytic anemia Blood work including iron studies, Y65, folic acid, serum protein electrophoresis, hemolysis: Normal. Most likely patient has anemia of chronic disease Patient follows with her PCP for her blood work.  No orders of the defined types were placed in this encounter.  The patient has a good understanding of the overall plan. she agrees with it. she will call with any problems that may develop before the next visit here.   Harriette Ohara, MD 09/29/18

## 2018-10-04 DIAGNOSIS — M47816 Spondylosis without myelopathy or radiculopathy, lumbar region: Secondary | ICD-10-CM | POA: Diagnosis not present

## 2018-10-26 DIAGNOSIS — M47816 Spondylosis without myelopathy or radiculopathy, lumbar region: Secondary | ICD-10-CM | POA: Diagnosis not present

## 2018-10-28 ENCOUNTER — Other Ambulatory Visit: Payer: Self-pay | Admitting: Cardiology

## 2018-11-08 ENCOUNTER — Other Ambulatory Visit: Payer: Self-pay

## 2018-12-01 DIAGNOSIS — C50212 Malignant neoplasm of upper-inner quadrant of left female breast: Secondary | ICD-10-CM | POA: Diagnosis not present

## 2018-12-05 DIAGNOSIS — C50912 Malignant neoplasm of unspecified site of left female breast: Secondary | ICD-10-CM | POA: Diagnosis not present

## 2018-12-05 DIAGNOSIS — I10 Essential (primary) hypertension: Secondary | ICD-10-CM | POA: Diagnosis not present

## 2018-12-05 DIAGNOSIS — M5126 Other intervertebral disc displacement, lumbar region: Secondary | ICD-10-CM | POA: Diagnosis not present

## 2018-12-05 DIAGNOSIS — F339 Major depressive disorder, recurrent, unspecified: Secondary | ICD-10-CM | POA: Diagnosis not present

## 2018-12-06 DIAGNOSIS — M461 Sacroiliitis, not elsewhere classified: Secondary | ICD-10-CM | POA: Diagnosis not present

## 2018-12-06 DIAGNOSIS — I10 Essential (primary) hypertension: Secondary | ICD-10-CM | POA: Diagnosis not present

## 2019-01-24 DIAGNOSIS — M5416 Radiculopathy, lumbar region: Secondary | ICD-10-CM | POA: Diagnosis not present

## 2019-01-24 DIAGNOSIS — M48062 Spinal stenosis, lumbar region with neurogenic claudication: Secondary | ICD-10-CM | POA: Diagnosis not present

## 2019-02-09 ENCOUNTER — Other Ambulatory Visit: Payer: Self-pay | Admitting: Cardiology

## 2019-04-07 DIAGNOSIS — F339 Major depressive disorder, recurrent, unspecified: Secondary | ICD-10-CM | POA: Diagnosis not present

## 2019-04-07 DIAGNOSIS — I10 Essential (primary) hypertension: Secondary | ICD-10-CM | POA: Diagnosis not present

## 2019-04-07 DIAGNOSIS — M5126 Other intervertebral disc displacement, lumbar region: Secondary | ICD-10-CM | POA: Diagnosis not present

## 2019-04-27 DIAGNOSIS — F324 Major depressive disorder, single episode, in partial remission: Secondary | ICD-10-CM | POA: Diagnosis not present

## 2019-05-22 ENCOUNTER — Other Ambulatory Visit: Payer: Self-pay

## 2019-05-22 DIAGNOSIS — C50212 Malignant neoplasm of upper-inner quadrant of left female breast: Secondary | ICD-10-CM

## 2019-05-22 DIAGNOSIS — Z170 Estrogen receptor positive status [ER+]: Secondary | ICD-10-CM

## 2019-05-25 ENCOUNTER — Other Ambulatory Visit (HOSPITAL_COMMUNITY): Payer: Self-pay | Admitting: Hematology and Oncology

## 2019-05-25 DIAGNOSIS — C50212 Malignant neoplasm of upper-inner quadrant of left female breast: Secondary | ICD-10-CM

## 2019-05-25 DIAGNOSIS — Z170 Estrogen receptor positive status [ER+]: Secondary | ICD-10-CM

## 2019-06-16 DIAGNOSIS — M5126 Other intervertebral disc displacement, lumbar region: Secondary | ICD-10-CM | POA: Diagnosis not present

## 2019-06-16 DIAGNOSIS — I10 Essential (primary) hypertension: Secondary | ICD-10-CM | POA: Diagnosis not present

## 2019-06-16 DIAGNOSIS — C50912 Malignant neoplasm of unspecified site of left female breast: Secondary | ICD-10-CM | POA: Diagnosis not present

## 2019-06-16 DIAGNOSIS — F339 Major depressive disorder, recurrent, unspecified: Secondary | ICD-10-CM | POA: Diagnosis not present

## 2019-06-27 ENCOUNTER — Ambulatory Visit (HOSPITAL_COMMUNITY)
Admission: RE | Admit: 2019-06-27 | Discharge: 2019-06-27 | Disposition: A | Discharge status: Home / Self Care | Payer: Medicare Other | Source: Ambulatory Visit | Attending: Hematology and Oncology | Admitting: Hematology and Oncology

## 2019-06-27 ENCOUNTER — Ambulatory Visit (HOSPITAL_COMMUNITY): Payer: Medicare Other

## 2019-06-27 ENCOUNTER — Other Ambulatory Visit: Payer: Self-pay

## 2019-06-27 DIAGNOSIS — R928 Other abnormal and inconclusive findings on diagnostic imaging of breast: Secondary | ICD-10-CM | POA: Diagnosis not present

## 2019-06-27 DIAGNOSIS — Z170 Estrogen receptor positive status [ER+]: Secondary | ICD-10-CM | POA: Diagnosis not present

## 2019-06-27 DIAGNOSIS — C50212 Malignant neoplasm of upper-inner quadrant of left female breast: Secondary | ICD-10-CM | POA: Diagnosis not present

## 2019-07-18 DIAGNOSIS — M48062 Spinal stenosis, lumbar region with neurogenic claudication: Secondary | ICD-10-CM | POA: Diagnosis not present

## 2019-07-18 DIAGNOSIS — I10 Essential (primary) hypertension: Secondary | ICD-10-CM | POA: Diagnosis not present

## 2019-07-18 DIAGNOSIS — M5416 Radiculopathy, lumbar region: Secondary | ICD-10-CM | POA: Diagnosis not present

## 2019-08-15 DIAGNOSIS — Z961 Presence of intraocular lens: Secondary | ICD-10-CM | POA: Diagnosis not present

## 2019-08-15 DIAGNOSIS — H04123 Dry eye syndrome of bilateral lacrimal glands: Secondary | ICD-10-CM | POA: Diagnosis not present

## 2019-09-09 ENCOUNTER — Emergency Department (HOSPITAL_COMMUNITY): Payer: Medicare Other

## 2019-09-09 ENCOUNTER — Other Ambulatory Visit: Payer: Self-pay

## 2019-09-09 ENCOUNTER — Inpatient Hospital Stay (HOSPITAL_COMMUNITY)
Admission: EM | Admit: 2019-09-09 | Discharge: 2019-10-22 | DRG: 193 | Disposition: E | Payer: Medicare Other | Attending: Pulmonary Disease | Admitting: Pulmonary Disease

## 2019-09-09 ENCOUNTER — Encounter (HOSPITAL_COMMUNITY): Payer: Self-pay | Admitting: Emergency Medicine

## 2019-09-09 DIAGNOSIS — J9600 Acute respiratory failure, unspecified whether with hypoxia or hypercapnia: Secondary | ICD-10-CM

## 2019-09-09 DIAGNOSIS — Z4659 Encounter for fitting and adjustment of other gastrointestinal appliance and device: Secondary | ICD-10-CM

## 2019-09-09 DIAGNOSIS — B359 Dermatophytosis, unspecified: Secondary | ICD-10-CM | POA: Diagnosis not present

## 2019-09-09 DIAGNOSIS — Z66 Do not resuscitate: Secondary | ICD-10-CM | POA: Diagnosis not present

## 2019-09-09 DIAGNOSIS — Z515 Encounter for palliative care: Secondary | ICD-10-CM

## 2019-09-09 DIAGNOSIS — Z886 Allergy status to analgesic agent status: Secondary | ICD-10-CM

## 2019-09-09 DIAGNOSIS — Y842 Radiological procedure and radiotherapy as the cause of abnormal reaction of the patient, or of later complication, without mention of misadventure at the time of the procedure: Secondary | ICD-10-CM | POA: Diagnosis present

## 2019-09-09 DIAGNOSIS — I5031 Acute diastolic (congestive) heart failure: Secondary | ICD-10-CM | POA: Diagnosis not present

## 2019-09-09 DIAGNOSIS — Z7982 Long term (current) use of aspirin: Secondary | ICD-10-CM

## 2019-09-09 DIAGNOSIS — Z7189 Other specified counseling: Secondary | ICD-10-CM

## 2019-09-09 DIAGNOSIS — R410 Disorientation, unspecified: Secondary | ICD-10-CM | POA: Diagnosis not present

## 2019-09-09 DIAGNOSIS — J849 Interstitial pulmonary disease, unspecified: Secondary | ICD-10-CM | POA: Diagnosis not present

## 2019-09-09 DIAGNOSIS — R531 Weakness: Secondary | ICD-10-CM | POA: Diagnosis not present

## 2019-09-09 DIAGNOSIS — Z8639 Personal history of other endocrine, nutritional and metabolic disease: Secondary | ICD-10-CM

## 2019-09-09 DIAGNOSIS — F329 Major depressive disorder, single episode, unspecified: Secondary | ICD-10-CM | POA: Diagnosis present

## 2019-09-09 DIAGNOSIS — Z8249 Family history of ischemic heart disease and other diseases of the circulatory system: Secondary | ICD-10-CM

## 2019-09-09 DIAGNOSIS — J470 Bronchiectasis with acute lower respiratory infection: Secondary | ICD-10-CM | POA: Diagnosis present

## 2019-09-09 DIAGNOSIS — J129 Viral pneumonia, unspecified: Principal | ICD-10-CM | POA: Diagnosis present

## 2019-09-09 DIAGNOSIS — J9690 Respiratory failure, unspecified, unspecified whether with hypoxia or hypercapnia: Secondary | ICD-10-CM | POA: Diagnosis not present

## 2019-09-09 DIAGNOSIS — R0682 Tachypnea, not elsewhere classified: Secondary | ICD-10-CM | POA: Diagnosis not present

## 2019-09-09 DIAGNOSIS — G4733 Obstructive sleep apnea (adult) (pediatric): Secondary | ICD-10-CM | POA: Diagnosis present

## 2019-09-09 DIAGNOSIS — R069 Unspecified abnormalities of breathing: Secondary | ICD-10-CM | POA: Diagnosis not present

## 2019-09-09 DIAGNOSIS — G9341 Metabolic encephalopathy: Secondary | ICD-10-CM | POA: Diagnosis not present

## 2019-09-09 DIAGNOSIS — J189 Pneumonia, unspecified organism: Secondary | ICD-10-CM

## 2019-09-09 DIAGNOSIS — J701 Chronic and other pulmonary manifestations due to radiation: Secondary | ICD-10-CM | POA: Diagnosis present

## 2019-09-09 DIAGNOSIS — Z853 Personal history of malignant neoplasm of breast: Secondary | ICD-10-CM

## 2019-09-09 DIAGNOSIS — T380X5A Adverse effect of glucocorticoids and synthetic analogues, initial encounter: Secondary | ICD-10-CM | POA: Diagnosis present

## 2019-09-09 DIAGNOSIS — R739 Hyperglycemia, unspecified: Secondary | ICD-10-CM | POA: Diagnosis present

## 2019-09-09 DIAGNOSIS — C50919 Malignant neoplasm of unspecified site of unspecified female breast: Secondary | ICD-10-CM | POA: Diagnosis not present

## 2019-09-09 DIAGNOSIS — N179 Acute kidney failure, unspecified: Secondary | ICD-10-CM

## 2019-09-09 DIAGNOSIS — I272 Pulmonary hypertension, unspecified: Secondary | ICD-10-CM | POA: Diagnosis present

## 2019-09-09 DIAGNOSIS — I10 Essential (primary) hypertension: Secondary | ICD-10-CM | POA: Diagnosis not present

## 2019-09-09 DIAGNOSIS — I361 Nonrheumatic tricuspid (valve) insufficiency: Secondary | ICD-10-CM | POA: Diagnosis not present

## 2019-09-09 DIAGNOSIS — Z79899 Other long term (current) drug therapy: Secondary | ICD-10-CM

## 2019-09-09 DIAGNOSIS — N170 Acute kidney failure with tubular necrosis: Secondary | ICD-10-CM | POA: Diagnosis not present

## 2019-09-09 DIAGNOSIS — J84114 Acute interstitial pneumonitis: Secondary | ICD-10-CM | POA: Diagnosis not present

## 2019-09-09 DIAGNOSIS — I110 Hypertensive heart disease with heart failure: Secondary | ICD-10-CM | POA: Diagnosis present

## 2019-09-09 DIAGNOSIS — Z91040 Latex allergy status: Secondary | ICD-10-CM

## 2019-09-09 DIAGNOSIS — E861 Hypovolemia: Secondary | ICD-10-CM | POA: Diagnosis not present

## 2019-09-09 DIAGNOSIS — I351 Nonrheumatic aortic (valve) insufficiency: Secondary | ICD-10-CM | POA: Diagnosis not present

## 2019-09-09 DIAGNOSIS — E870 Hyperosmolality and hypernatremia: Secondary | ICD-10-CM | POA: Diagnosis not present

## 2019-09-09 DIAGNOSIS — R54 Age-related physical debility: Secondary | ICD-10-CM | POA: Diagnosis present

## 2019-09-09 DIAGNOSIS — E876 Hypokalemia: Secondary | ICD-10-CM | POA: Diagnosis not present

## 2019-09-09 DIAGNOSIS — D6489 Other specified anemias: Secondary | ICD-10-CM | POA: Diagnosis present

## 2019-09-09 DIAGNOSIS — R05 Cough: Secondary | ICD-10-CM | POA: Diagnosis not present

## 2019-09-09 DIAGNOSIS — I5033 Acute on chronic diastolic (congestive) heart failure: Secondary | ICD-10-CM | POA: Diagnosis not present

## 2019-09-09 DIAGNOSIS — J15211 Pneumonia due to Methicillin susceptible Staphylococcus aureus: Secondary | ICD-10-CM | POA: Diagnosis not present

## 2019-09-09 DIAGNOSIS — J8410 Pulmonary fibrosis, unspecified: Secondary | ICD-10-CM | POA: Diagnosis not present

## 2019-09-09 DIAGNOSIS — R5381 Other malaise: Secondary | ICD-10-CM | POA: Diagnosis not present

## 2019-09-09 DIAGNOSIS — R918 Other nonspecific abnormal finding of lung field: Secondary | ICD-10-CM | POA: Diagnosis not present

## 2019-09-09 DIAGNOSIS — J9601 Acute respiratory failure with hypoxia: Secondary | ICD-10-CM | POA: Diagnosis not present

## 2019-09-09 DIAGNOSIS — Z4682 Encounter for fitting and adjustment of non-vascular catheter: Secondary | ICD-10-CM | POA: Diagnosis not present

## 2019-09-09 DIAGNOSIS — R0602 Shortness of breath: Secondary | ICD-10-CM | POA: Diagnosis not present

## 2019-09-09 DIAGNOSIS — E871 Hypo-osmolality and hyponatremia: Secondary | ICD-10-CM | POA: Diagnosis present

## 2019-09-09 DIAGNOSIS — Z20828 Contact with and (suspected) exposure to other viral communicable diseases: Secondary | ICD-10-CM | POA: Diagnosis present

## 2019-09-09 DIAGNOSIS — Z9989 Dependence on other enabling machines and devices: Secondary | ICD-10-CM | POA: Diagnosis not present

## 2019-09-09 DIAGNOSIS — Z79811 Long term (current) use of aromatase inhibitors: Secondary | ICD-10-CM

## 2019-09-09 DIAGNOSIS — Z7722 Contact with and (suspected) exposure to environmental tobacco smoke (acute) (chronic): Secondary | ICD-10-CM | POA: Diagnosis present

## 2019-09-09 DIAGNOSIS — Z09 Encounter for follow-up examination after completed treatment for conditions other than malignant neoplasm: Secondary | ICD-10-CM

## 2019-09-09 DIAGNOSIS — R0902 Hypoxemia: Secondary | ICD-10-CM

## 2019-09-09 DIAGNOSIS — J9621 Acute and chronic respiratory failure with hypoxia: Secondary | ICD-10-CM | POA: Diagnosis not present

## 2019-09-09 DIAGNOSIS — E43 Unspecified severe protein-calorie malnutrition: Secondary | ICD-10-CM | POA: Insufficient documentation

## 2019-09-09 DIAGNOSIS — Z888 Allergy status to other drugs, medicaments and biological substances status: Secondary | ICD-10-CM

## 2019-09-09 HISTORY — DX: Heart failure, unspecified: I50.9

## 2019-09-09 LAB — CBC WITH DIFFERENTIAL/PLATELET
Abs Immature Granulocytes: 0.08 10*3/uL — ABNORMAL HIGH (ref 0.00–0.07)
Basophils Absolute: 0 10*3/uL (ref 0.0–0.1)
Basophils Relative: 0 %
Eosinophils Absolute: 0 10*3/uL (ref 0.0–0.5)
Eosinophils Relative: 0 %
HCT: 34.2 % — ABNORMAL LOW (ref 36.0–46.0)
Hemoglobin: 11.1 g/dL — ABNORMAL LOW (ref 12.0–15.0)
Immature Granulocytes: 1 %
Lymphocytes Relative: 5 %
Lymphs Abs: 0.7 10*3/uL (ref 0.7–4.0)
MCH: 28.6 pg (ref 26.0–34.0)
MCHC: 32.5 g/dL (ref 30.0–36.0)
MCV: 88.1 fL (ref 80.0–100.0)
Monocytes Absolute: 0.9 10*3/uL (ref 0.1–1.0)
Monocytes Relative: 7 %
Neutro Abs: 12 10*3/uL — ABNORMAL HIGH (ref 1.7–7.7)
Neutrophils Relative %: 87 %
Platelets: 618 10*3/uL — ABNORMAL HIGH (ref 150–400)
RBC: 3.88 MIL/uL (ref 3.87–5.11)
RDW: 14 % (ref 11.5–15.5)
WBC: 13.7 10*3/uL — ABNORMAL HIGH (ref 4.0–10.5)
nRBC: 0 % (ref 0.0–0.2)

## 2019-09-09 LAB — COMPREHENSIVE METABOLIC PANEL
ALT: 18 U/L (ref 0–44)
AST: 23 U/L (ref 15–41)
Albumin: 3.1 g/dL — ABNORMAL LOW (ref 3.5–5.0)
Alkaline Phosphatase: 83 U/L (ref 38–126)
Anion gap: 10 (ref 5–15)
BUN: 22 mg/dL (ref 8–23)
CO2: 24 mmol/L (ref 22–32)
Calcium: 9.2 mg/dL (ref 8.9–10.3)
Chloride: 89 mmol/L — ABNORMAL LOW (ref 98–111)
Creatinine, Ser: 0.9 mg/dL (ref 0.44–1.00)
GFR calc Af Amer: >60 mL/min (ref >60)
GFR calc non Af Amer: 57 mL/min — ABNORMAL LOW (ref >60)
Glucose, Bld: 199 mg/dL — ABNORMAL HIGH (ref 70–99)
Potassium: 3.9 mmol/L (ref 3.5–5.1)
Sodium: 123 mmol/L — ABNORMAL LOW (ref 135–145)
Total Bilirubin: 0.1 mg/dL — ABNORMAL LOW (ref 0.3–1.2)
Total Protein: 7.6 g/dL (ref 6.5–8.1)

## 2019-09-09 LAB — SARS CORONAVIRUS 2 BY RT PCR (HOSPITAL ORDER, PERFORMED IN ~~LOC~~ HOSPITAL LAB): SARS Coronavirus 2: NEGATIVE

## 2019-09-09 LAB — URINALYSIS, ROUTINE W REFLEX MICROSCOPIC
Bacteria, UA: NONE SEEN
Bilirubin Urine: NEGATIVE
Glucose, UA: >=500 mg/dL — AB
Hgb urine dipstick: NEGATIVE
Ketones, ur: NEGATIVE mg/dL
Leukocytes,Ua: NEGATIVE
Nitrite: NEGATIVE
Protein, ur: 30 mg/dL — AB
Specific Gravity, Urine: 1.014 (ref 1.005–1.030)
pH: 5 (ref 5.0–8.0)

## 2019-09-09 LAB — C-REACTIVE PROTEIN: CRP: 27.1 mg/dL — ABNORMAL HIGH (ref <1.0)

## 2019-09-09 LAB — PROCALCITONIN: Procalcitonin: <0.1 ng/mL

## 2019-09-09 LAB — TRIGLYCERIDES: Triglycerides: 33 mg/dL (ref <150)

## 2019-09-09 LAB — LACTIC ACID, PLASMA
Lactic Acid, Venous: 1.7 mmol/L (ref 0.5–1.9)
Lactic Acid, Venous: 1.2 mmol/L (ref 0.5–1.9)

## 2019-09-09 LAB — D-DIMER, QUANTITATIVE: D-Dimer, Quant: 2.61 ug/mL-FEU — ABNORMAL HIGH (ref 0.00–0.50)

## 2019-09-09 LAB — FIBRINOGEN: Fibrinogen: >800 mg/dL — ABNORMAL HIGH (ref 210–475)

## 2019-09-09 LAB — FERRITIN: Ferritin: 209 ng/mL (ref 11–307)

## 2019-09-09 LAB — LACTATE DEHYDROGENASE: LDH: 177 U/L (ref 98–192)

## 2019-09-09 LAB — BRAIN NATRIURETIC PEPTIDE: B Natriuretic Peptide: 67 pg/mL (ref 0.0–100.0)

## 2019-09-09 MED ORDER — SODIUM CHLORIDE 0.9 % IV BOLUS
500.0000 mL | Freq: Once | INTRAVENOUS | Status: AC
  Administered 2019-09-09: 500 mL via INTRAVENOUS

## 2019-09-09 MED ORDER — SODIUM CHLORIDE 0.9 % IV SOLN
500.0000 mg | INTRAVENOUS | Status: DC
  Administered 2019-09-09 – 2019-09-14 (×6): 500 mg via INTRAVENOUS
  Filled 2019-09-09 (×10): qty 500

## 2019-09-09 MED ORDER — SODIUM CHLORIDE 0.9 % IV SOLN
2.0000 g | INTRAVENOUS | Status: DC
  Administered 2019-09-09 – 2019-09-15 (×7): 2 g via INTRAVENOUS
  Filled 2019-09-09: qty 20
  Filled 2019-09-09: qty 2
  Filled 2019-09-09: qty 20
  Filled 2019-09-09: qty 2
  Filled 2019-09-09 (×2): qty 20
  Filled 2019-09-09: qty 2
  Filled 2019-09-09: qty 20

## 2019-09-09 MED ORDER — IOHEXOL 350 MG/ML SOLN
100.0000 mL | Freq: Once | INTRAVENOUS | Status: AC | PRN
  Administered 2019-09-09: 100 mL via INTRAVENOUS

## 2019-09-09 NOTE — ED Notes (Signed)
Daughter called  Charlotte [name] [phone]  She will inform pt spouse   Charlotte [name], sr     [phone]

## 2019-09-09 NOTE — ED Triage Notes (Signed)
Pt states she has been having progressive sob with cough and fever for the past few days. Tylenol 1500mg  taken today.

## 2019-09-09 NOTE — ED Notes (Signed)
ED TO INPATIENT HANDOFF REPORT  ED Nurse Name and Phone #: Eustaquio Maize [phone]  S Name/Age/Gender Charlotte [name] 85 y.o. female Room/Bed: APA18/APA18  Code Status   Code Status: Prior  Home/SNF/Other Home Patient oriented to: self, place, time and situation Is this baseline? Yes   Triage Complete: Triage complete  Chief Complaint Covid sx  Triage Note Pt states she has been having progressive sob with cough and fever for the past few days. Tylenol 1500mg  taken today.    Allergies Allergies  Allergen Reactions  . Latex Swelling    SEVERITY OF SWELLING NOT DEFINED.  Marland Kitchen Bextra [Valdecoxib] Other (See Comments)    Stomach Pains  . Motrin [Ibuprofen] Other (See Comments)    Stomach pain    Level of Care/Admitting Diagnosis ED Disposition    ED Disposition Condition Comment   Admit  Hospital Area: [facility] [[id number]]  Level of Care: Med-Surg [16]  Covid Evaluation: Person Under Investigation (PUI)  Diagnosis: Pneumonia [id number]  Admitting Physician: Loree Fee  Attending Physician: Ivor Costa [phone]  Estimated length of stay: 3 - 4 days  Certification:: I certify this patient will need inpatient services for at least 2 midnights  PT Class (Do Not Modify): Inpatient [101]  PT Acc Code (Do Not Modify): Private [1]       B Medical/Surgery History Past Medical History:  Diagnosis Date  . Anxiety   . Arthritis   . Back pain   . Cancer ([place])    breast cancer x 2  . CHF (congestive heart failure) ([place])   . Depression   . Fatigue   . History of hyperthyroidism    resolved, no medicaions for treatment at this time  . Hypertension   . Insomnia   . OSA (obstructive sleep apnea)   . Personal history of radiation therapy 2017  . Pulmonary hypertension ([place])    Past Surgical History:  Procedure Laterality Date  . BREAST LUMPECTOMY     right side  . BREAST LUMPECTOMY WITH AXILLARY LYMPH NODE BIOPSY Left 06/17/2016   Procedure: BREAST  LUMPECTOMY WITH AXILLARY LYMPH NODE BIOPSY;  Surgeon: Autumn Messing III, MD;  Location: [facility];  Service: General;  Laterality: Left;  . COLONOSCOPY    . DILATION AND CURETTAGE OF UTERUS    . EYE SURGERY     bilateral cataracts removed  . FOOT SURGERY    . PORTACATH PLACEMENT Right 07/16/2016   Procedure: INSERTION PORT-A-CATH RIGHT SUBLCLAVIAN;  Surgeon: Autumn Messing III, MD;  Location: [facility];  Service: General;  Laterality: Right;  . TONSILLECTOMY       A IV Location/Drains/Wounds Patient Lines/Drains/Airways Status   Active Line/Drains/Airways    Name:   Placement date:   Placement time:   Site:   Days:   Peripheral IV 09/11/2019 Right Forearm   09/08/2019    1638    Forearm   less than 1   Peripheral IV 09/14/2019 Left Antecubital   08/31/2019    1630    Antecubital   less than 1   Incision (Closed) 07/16/16 Chest Right   07/16/16    0844     1150          Intake/Output Last 24 hours No intake or output data in the 24 hours ending 09/18/2019 2334  Labs/Imaging Results for orders placed or performed during the hospital encounter of 09/10/2019 (from the past 48 hour(s))  Lactic acid, plasma     Status: None  Collection Time: 09/07/2019  4:26 PM  Result Value Ref Range   Lactic Acid, Venous 1.7 0.5 - 1.9 mmol/L    Comment: Performed at [facility], [address], [place], [place] [address]  Urinalysis, Routine w reflex microscopic     Status: Abnormal   Collection Time: 08/25/2019  4:36 PM  Result Value Ref Range   Color, Urine YELLOW YELLOW   APPearance CLEAR CLEAR   Specific Gravity, Urine 1.014 1.005 - 1.030   pH 5.0 5.0 - 8.0   Glucose, UA >=500 (A) NEGATIVE mg/dL   Hgb urine dipstick NEGATIVE NEGATIVE   Bilirubin Urine NEGATIVE NEGATIVE   Ketones, ur NEGATIVE NEGATIVE mg/dL   Protein, ur 30 (A) NEGATIVE mg/dL   Nitrite NEGATIVE NEGATIVE   Leukocytes,Ua NEGATIVE NEGATIVE   RBC / HPF 0-5 0 - 5 RBC/hpf   WBC, UA 0-5 0 - 5 WBC/hpf   Bacteria, UA NONE SEEN NONE SEEN    Comment: Performed  at [facility], [address] [place], [place] [address]  Blood Culture (routine x 2)     Status: None (Preliminary result)   Collection Time: 08/31/2019  4:38 PM   Specimen: Peripheral; Blood  Result Value Ref Range   Specimen Description      BLOOD RIGHT FOREARM BOTTLES DRAWN AEROBIC AND ANAEROBIC   Special Requests      Blood Culture adequate volume Performed at [facility], [address] [place], [place] [address]    Culture PENDING    Report Status PENDING   CBC WITH DIFFERENTIAL     Status: Abnormal   Collection Time: 09/07/2019  4:38 PM  Result Value Ref Range   WBC 13.7 (H) 4.0 - 10.5 K/uL   RBC 3.88 3.87 - 5.11 MIL/uL   Hemoglobin 11.1 (L) 12.0 - 15.0 g/dL   HCT 34.2 (L) 36.0 - 46.0 %   MCV 88.1 80.0 - 100.0 fL   MCH 28.6 26.0 - 34.0 pg   MCHC 32.5 30.0 - 36.0 g/dL   RDW 14.0 11.5 - 15.5 %   Platelets 618 (H) 150 - 400 K/uL   nRBC 0.0 0.0 - 0.2 %   Neutrophils Relative % 87 %   Neutro Abs 12.0 (H) 1.7 - 7.7 K/uL   Lymphocytes Relative 5 %   Lymphs Abs 0.7 0.7 - 4.0 K/uL   Monocytes Relative 7 %   Monocytes Absolute 0.9 0.1 - 1.0 K/uL   Eosinophils Relative 0 %   Eosinophils Absolute 0.0 0.0 - 0.5 K/uL   Basophils Relative 0 %   Basophils Absolute 0.0 0.0 - 0.1 K/uL   Immature Granulocytes 1 %   Abs Immature Granulocytes 0.08 (H) 0.00 - 0.07 K/uL    Comment: Performed at [facility], [address], [place], [place] [address]  Comprehensive metabolic panel     Status: Abnormal   Collection Time: 09/14/2019  4:38 PM  Result Value Ref Range   Sodium 123 (L) 135 - 145 mmol/L   Potassium 3.9 3.5 - 5.1 mmol/L   Chloride 89 (L) 98 - 111 mmol/L   CO2 24 22 - 32 mmol/L   Glucose, Bld 199 (H) 70 - 99 mg/dL   BUN 22 8 - 23 mg/dL   Creatinine, Ser 0.90 0.44 - 1.00 mg/dL   Calcium 9.2 8.9 - 10.3 mg/dL   Total Protein 7.6 6.5 - 8.1 g/dL   Albumin 3.1 (L) 3.5 - 5.0 g/dL   AST 23 15 - 41 U/L   ALT 18  0 - 44 U/L   Alkaline Phosphatase 83 38 - 126 U/L   Total Bilirubin 0.1  (L) 0.3 - 1.2 mg/dL   GFR calc non Af Amer 57 (L) >60 mL/min   GFR calc Af Amer >60 >60 mL/min   Anion gap 10 5 - 15    Comment: Performed at [facility], [address] [place], [place] [address]  D-dimer, quantitative     Status: Abnormal   Collection Time: 08/25/2019  4:38 PM  Result Value Ref Range   D-Dimer, Quant 2.61 (H) 0.00 - 0.50 ug/mL-FEU    Comment: (NOTE) At the manufacturer cut-off of 0.50 ug/mL FEU, this assay has been documented to exclude PE with a sensitivity and negative predictive value of 97 to 99%.  At this time, this assay has not been approved by the FDA to exclude DVT/VTE. Results should be correlated with clinical presentation. Performed at [facility], [address], [place], [place] [address]   Procalcitonin     Status: None   Collection Time: 09/14/2019  4:38 PM  Result Value Ref Range   Procalcitonin <0.10 ng/mL    Comment:        Interpretation: PCT (Procalcitonin) <= 0.5 ng/mL: Systemic infection (sepsis) is not likely. Local bacterial infection is possible. (NOTE)       Sepsis PCT Algorithm           Lower Respiratory Tract                                      Infection PCT Algorithm    ----------------------------     ----------------------------         PCT < 0.25 ng/mL                PCT < 0.10 ng/mL         Strongly encourage             Strongly discourage   discontinuation of antibiotics    initiation of antibiotics    ----------------------------     -----------------------------       PCT 0.25 - 0.50 ng/mL            PCT 0.10 - 0.25 ng/mL               OR       >80% decrease in PCT            Discourage initiation of                                            antibiotics      Encourage discontinuation           of antibiotics    ----------------------------     -----------------------------         PCT >= 0.50 ng/mL              PCT 0.26 - 0.50 ng/mL               AND        <80% decrease in PCT             Encourage initiation of  antibiotics       Encourage continuation           of antibiotics    ----------------------------     -----------------------------        PCT >= 0.50 ng/mL                  PCT > 0.50 ng/mL               AND         increase in PCT                  Strongly encourage                                      initiation of antibiotics    Strongly encourage escalation           of antibiotics                                     -----------------------------                                           PCT <= 0.25 ng/mL                                                 OR                                        > 80% decrease in PCT                                     Discontinue / Do not initiate                                             antibiotics Performed at [facility], [address] [place], [place] [address]   Lactate dehydrogenase     Status: None   Collection Time: 09/04/2019  4:38 PM  Result Value Ref Range   LDH 177 98 - 192 U/L    Comment: Performed at [facility], [address], [place], [place] [address]  Ferritin     Status: None   Collection Time: 09/19/2019  4:38 PM  Result Value Ref Range   Ferritin 209 11 - 307 ng/mL    Comment: Performed at [facility], [address] [place], [place] [address]  Triglycerides     Status: None   Collection Time: 08/30/2019  4:38 PM  Result Value Ref Range   Triglycerides 33 <150 mg/dL    Comment: Performed at [facility], [address], [place], [place] [address]  Fibrinogen     Status: Abnormal   Collection Time: 09/12/2019  4:38 PM  Result Value Ref Range   Fibrinogen >800 (H) 210 - 475 mg/dL  Comment: Performed at [facility], [address] [place], [place] [address]  C-reactive protein     Status: Abnormal   Collection Time: 09/19/2019  4:38 PM  Result Value Ref Range   CRP 27.1 (H) <1.0 mg/dL    Comment: Performed at [facility], [address], [place], [place] [address]  Brain  natriuretic peptide     Status: None   Collection Time: 08/25/2019  4:38 PM  Result Value Ref Range   B Natriuretic Peptide 67.0 0.0 - 100.0 pg/mL    Comment: Performed at [facility], [address], [place], [place] [address]  Blood Culture (routine x 2)     Status: None (Preliminary result)   Collection Time: 09/06/2019  4:40 PM   Specimen: Left Antecubital; Blood  Result Value Ref Range   Specimen Description      LEFT ANTECUBITAL BOTTLES DRAWN AEROBIC AND ANAEROBIC   Special Requests      Blood Culture adequate volume Performed at [facility], [address] [place], [place] [address]    Culture PENDING    Report Status PENDING   SARS Coronavirus 2 [facility] order, Performed in [facility] hospital lab) Nasopharyngeal Nasopharyngeal Swab     Status: None   Collection Time: 09/14/2019  4:55 PM   Specimen: Nasopharyngeal Swab  Result Value Ref Range   SARS Coronavirus 2 NEGATIVE NEGATIVE    Comment: (NOTE) If result is NEGATIVE SARS-CoV-2 target nucleic acids are NOT DETECTED. The SARS-CoV-2 RNA is generally detectable in upper and lower  respiratory specimens during the acute phase of infection. The lowest  concentration of SARS-CoV-2 viral copies this assay can detect is 250  copies / mL. A negative result does not preclude SARS-CoV-2 infection  and should not be used as the sole basis for treatment or other  patient management decisions.  A negative result may occur with  improper specimen collection / handling, submission of specimen other  than nasopharyngeal swab, presence of viral mutation(s) within the  areas targeted by this assay, and inadequate number of viral copies  (<250 copies / mL). A negative result must be combined with clinical  observations, patient history, and epidemiological information. If result is POSITIVE SARS-CoV-2 target nucleic acids are DETECTED. The SARS-CoV-2 RNA is generally detectable in upper and lower  respiratory specimens dur ing the acute  phase of infection.  Positive  results are indicative of active infection with SARS-CoV-2.  Clinical  correlation with patient history and other diagnostic information is  necessary to determine patient infection status.  Positive results do  not rule out bacterial infection or co-infection with other viruses. If result is PRESUMPTIVE POSTIVE SARS-CoV-2 nucleic acids MAY BE PRESENT.   A presumptive positive result was obtained on the submitted specimen  and confirmed on repeat testing.  While 2019 novel coronavirus  (SARS-CoV-2) nucleic acids may be present in the submitted sample  additional confirmatory testing may be necessary for epidemiological  and / or clinical management purposes  to differentiate between  SARS-CoV-2 and other Sarbecovirus currently known to infect humans.  If clinically indicated additional testing with an alternate test  methodology [phone]) is advised. The SARS-CoV-2 RNA is generally  detectable in upper and lower respiratory sp ecimens during the acute  phase of infection. The expected result is Negative. Fact Sheet for Patients:  StrictlyIdeas.no Fact Sheet for Healthcare Providers: BankingDealers.co.za This test is not yet approved or cleared by the [place] FDA and has been authorized for detection and/or diagnosis of  SARS-CoV-2 by FDA under an Emergency Use Authorization (EUA).  This EUA will remain in effect (meaning this test can be used) for the duration of the COVID-19 declaration under Section 564(b)(1) of the Act, 21 U.S.C. section 360bbb-3(b)(1), unless the authorization is terminated or revoked sooner. Performed at [facility], [address], [place], [place] [address]   Lactic acid, plasma     Status: None   Collection Time: 09/12/2019  6:56 PM  Result Value Ref Range   Lactic Acid, Venous 1.2 0.5 - 1.9 mmol/L    Comment: Performed at [facility], [address], [place], [place] [address]    Ct Angio Chest Pe W And/or Wo Contrast  Result Date: 08/22/2019 CLINICAL DATA:  Shortness of breath EXAM: CT ANGIOGRAPHY CHEST WITH CONTRAST TECHNIQUE: Multidetector CT imaging of the chest was performed using the standard protocol during bolus administration of intravenous contrast. Multiplanar CT image reconstructions and MIPs were obtained to evaluate the vascular anatomy. CONTRAST:  134mL OMNIPAQUE IOHEXOL 350 MG/ML SOLN COMPARISON:  June 21, 2017. FINDINGS: Cardiovascular: Contrast injection is sufficient to demonstrate satisfactory opacification of the pulmonary arteries to the segmental level. There is no pulmonary embolus. The main pulmonary artery is within normal limits for size. There is no CT evidence of acute right heart strain. The visualized aorta demonstrates mild atherosclerotic changes. Heart size is the heart size is mildly enlarged. There is no significant pericardial effusion. Mediastinum/Nodes: --No mediastinal or hilar lymphadenopathy. --No axillary lymphadenopathy. --No supraclavicular lymphadenopathy. --Normal thyroid gland. --The esophagus is unremarkable Lungs/Pleura: Again noted is scarring and traction bronchiectasis within the lingula presumably related to the patient's reported history of breast radiation. There are trace bilateral pleural effusions, left greater than right. There are areas of ground-glass opacification primarily within the right upper lobe, right middle lobe and bilateral lower lobes. These areas demonstrate interlobular septal thickening. There is no pneumothorax. The trachea is unremarkable. Upper Abdomen: No acute abnormality. Musculoskeletal: No chest wall abnormality. No acute or significant osseous findings. Review of the MIP images confirms the above findings. IMPRESSION: 1. There is no evidence for pulmonary embolus. 2. There are areas of ground-glass opacification primarily within the right upper lobe, right middle lobe and bilateral lower lobes. These  areas demonstrate interlobular septal thickening. These findings are nonspecific and may be related to pulmonary edema, acute interstitial pneumonia, drug toxicity, versus less likely diffuse alveolar hemorrhage. 3. Trace bilateral pleural effusions, left greater than right. 4. Redemonstration of scarring and traction bronchiectasis within the lingula presumably related to the patient's reported history of breast radiation. Aortic Atherosclerosis (ICD10-I70.0). Electronically Signed   By: Constance Holster M.D.   On: 09/20/2019 18:59   Dg Chest Port 1 View  Result Date: 09/12/2019 CLINICAL DATA:  Hypoxia, shortness of breath EXAM: PORTABLE CHEST 1 VIEW COMPARISON:  04/26/2018 FINDINGS: Mild cardiomegaly, stable. Calcific aortic knob. Diffuse bilateral interstitial opacities, slightly more confluent within the right upper lobe. No pleural effusion. No pneumothorax. IMPRESSION: Diffuse bilateral interstitial opacities with a more confluent opacity in the right upper lobe. Findings favored to represent an atypical or viral pneumonia. Diffuse interstitial edema could have a similar appearance. Electronically Signed   By: Davina Poke M.D.   On: 08/29/2019 17:03    Pending Labs Unresulted Labs (From admission, onward)    Start     Ordered   08/23/2019 2051  Osmolality  Once,   STAT     08/31/2019 2050   09/19/2019 2051  Osmolality, urine  Once,   STAT  09/17/2019 2050   08/30/2019 2051  Sodium, urine, random  Once,   STAT     09/18/2019 2050   08/30/2019 2037  SARS CORONAVIRUS 2 (TAT 6-24 HRS) Nasopharyngeal Nasopharyngeal Swab  (Asymptomatic/Tier 2 Patients Labs)  Once,   STAT    Question Answer Comment  Is this test for diagnosis or screening Diagnosis of ill patient   Symptomatic for COVID-19 as defined by CDC Yes   Date of Symptom Onset 08/30/2019   Hospitalized for COVID-19 Unknown   Admitted to ICU for COVID-19 Unknown   Previously tested for COVID-19 Yes   Resident in a congregate (group) care  setting No   Employed in healthcare setting No   Pregnant No      09/01/2019 2037   09/14/2019 1655  Urine culture  ONCE - STAT,   STAT     09/15/2019 1655   Signed and Held  CBC  (enoxaparin (LOVENOX)    CrCl >/= 30 ml/min)  Once,   R    Comments: Baseline for enoxaparin therapy IF NOT ALREADY DRAWN.  Notify MD if PLT < 100 K.    Signed and Held   Signed and Held  Creatinine, serum  (enoxaparin (LOVENOX)    CrCl >/= 30 ml/min)  Once,   R    Comments: Baseline for enoxaparin therapy IF NOT ALREADY DRAWN.    Signed and Held   Signed and Held  Creatinine, serum  (enoxaparin (LOVENOX)    CrCl >/= 30 ml/min)  Weekly,   R    Comments: while on enoxaparin therapy    Signed and Held   Signed and Held  CBC  Tomorrow morning,   R     Signed and Held   Signed and Held  Comprehensive metabolic panel  Tomorrow morning,   R     Signed and Held          Vitals/Pain Today's Vitals   09/13/2019 2000 09/01/2019 2030 08/28/2019 2100 09/10/2019 2130  BP: (!) 141/65 (!) 163/76 (!) 168/133 (!) 163/69  Pulse: 81 69 80 69  Resp: (!) 29 (!) 21 (!) 23 (!) 23  Temp:      TempSrc:      SpO2: 91% 93% (!) 88% 95%  Weight:      Height:      PainSc:        Isolation Precautions No active isolations  Medications Medications  cefTRIAXone (ROCEPHIN) 2 g in sodium chloride 0.9 % 100 mL IVPB (2 g Intravenous New Bag/Given 09/01/2019 1804)  azithromycin (ZITHROMAX) 500 mg in sodium chloride 0.9 % 250 mL IVPB (500 mg Intravenous New Bag/Given 09/01/2019 1805)  iohexol (OMNIPAQUE) 350 MG/ML injection 100 mL (100 mLs Intravenous Contrast Given 09/10/2019 1828)  sodium chloride 0.9 % bolus 500 mL (0 mLs Intravenous Stopped 09/17/2019 2040)    Mobility walks Low fall risk   Focused Assessments   R Recommendations: See Admitting Provider Note  Report given to:   Additional Notes:

## 2019-09-09 NOTE — ED Provider Notes (Signed)
[facility] EMERGENCY DEPARTMENT Provider Note   CSN: [id number] Arrival date & time: 09/06/2019  1613     History   Chief Complaint Chief Complaint  Patient presents with  . Shortness of Breath    HPI Charlotte [name] is a 85 y.o. female presenting for evaluation of shortness of breath.  Patient states today she has had gradually worsening shortness of breath.  Patient states she uses a CPAP at night, has been using it as she is supposed to.  She tried a CPAP today to help with her breathing, but this did not help.  She reports a mild nonproductive cough which is normal for her due to her chronic postnasal drip.  Patient reports a temperature at home, she does not remember what the number was, but thinks it was in the 99's.  She took 1500 mg of Tylenol prior to calling EMS.  She denies sore throat, chest pain, nausea, vomiting, domino pain, urinary symptoms, normal bowel movements.  She denies sick contacts or contact with COVID-19 positive person.  She lives at home with her husband and her son.  Neither are sick. Patient denies any history of lung problems other than OSA, denies COPD or asthma.  Additional history of breast cancer status post surgery and radiation.  Currently on immunosuppression with Arimidex.  Additional history of hypertension, anxiety, and pulmonary hypertension.     HPI  Past Medical History:  Diagnosis Date  . Anxiety   . Arthritis   . Back pain   . Cancer ([place])    breast cancer x 2  . CHF (congestive heart failure) ([place])   . Depression   . Fatigue   . History of hyperthyroidism    resolved, no medicaions for treatment at this time  . Hypertension   . Insomnia   . OSA (obstructive sleep apnea)   . Personal history of radiation therapy 2017  . Pulmonary hypertension [facility])     Patient Active Problem List   Diagnosis Date Noted  . Pneumonia 09/08/2019  . Normocytic anemia 05/18/2018  . Pulmonary HTN ([place]) 12/23/2016  . Arthritis 12/23/2016  .  Essential hypertension 12/09/2016  . 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 Chronic insomnia 11/04/2016  . 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 OSA (obstructive sleep apnea) 11/04/2016  . Snoring 11/04/2016  . Fatigue due to treatment 11/04/2016  . Malignant neoplasm of upper-inner quadrant of left female breast ([place]) 07/01/2016  . Generalized anxiety disorder 01/28/2016  . SHOULDER, ARTHRITIS, DEGEN./OSTEO 12/23/2009  . IMPINGEMENT SYNDROME 12/23/2009  . History of cardiovascular disorder 12/23/2009    Past Surgical History:  Procedure Laterality Date  . BREAST LUMPECTOMY     right side  . BREAST LUMPECTOMY WITH AXILLARY LYMPH NODE BIOPSY Left 06/17/2016   Procedure: BREAST LUMPECTOMY WITH AXILLARY LYMPH NODE BIOPSY;  Surgeon: Autumn Messing III, MD;  Location: [place];  Service: General;  Laterality: Left;  . COLONOSCOPY    . DILATION AND CURETTAGE OF UTERUS    . EYE SURGERY     bilateral cataracts removed  . FOOT SURGERY    . PORTACATH PLACEMENT Right 07/16/2016   Procedure: INSERTION PORT-A-CATH RIGHT SUBLCLAVIAN;  Surgeon: Autumn Messing III, MD;  Location: [place];  Service: General;  Laterality: Right;  . TONSILLECTOMY       OB History    Gravida  2   Para  2   Term  2   Preterm      AB  Living  2     SAB      TAB      Ectopic      Multiple      Live Births               Home Medications    Prior to Admission medications   Medication Sig Start Date End Date Taking? Authorizing Provider  anastrozole (ARIMIDEX) 1 MG tablet Take 0.5 tablets (0.5 mg total) by mouth daily. 09/29/18  Yes Nicholas Lose, MD  BELSOMRA 5 MG TABS Take 1 tablet by mouth daily. 09/02/17  Yes [provider]  Calcium Carbonate-Vitamin D (CALCIUM 600+D3 PO) Take 1 tablet by mouth 2 (two) times daily.   Yes [provider]  carbamide peroxide (DEBROX) 6.5 % otic solution Place 5 drops into both ears daily as needed (ear wax buildup).    Yes  [provider]  cycloSPORINE (RESTASIS) 0.05 % ophthalmic emulsion Place 1 drop into both eyes 2 (two) times daily.   Yes [provider]  felodipine (PLENDIL) 10 MG 24 hr tablet Take 1 tablet (10 mg total) by mouth daily. 10/06/17  Yes BranchAlphonse Guild, MD  fluticasone (FLONASE) 50 MCG/ACT nasal spray Place 2 sprays into the nose daily as needed for allergies.   Yes [provider]  hydrALAZINE (APRESOLINE) 25 MG tablet Take 1 tablet (25 mg total) by mouth 3 (three) times daily. 10/06/17 08/29/2019 Yes BranchAlphonse Guild, MD  losartan (COZAAR) 100 MG tablet Take 100 mg by mouth daily.   Yes [provider]  Omega-3 Fatty Acids (FISH OIL TRIPLE STRENGTH) 1400 MG CAPS Take 1 tablet by mouth daily.   Yes [provider]  Polyethyl Glycol-Propyl Glycol (SYSTANE) 0.4-0.3 % SOLN Apply 1 drop to eye 2 (two) times daily.   Yes [provider]  potassium chloride SA (K-DUR,KLOR-CON) 20 MEQ tablet TAKE (1) TABLET BY MOUTH DAILY. 10/28/18  Yes BranchAlphonse Guild, MD  sertraline (ZOLOFT) 50 MG tablet Take 50 mg by mouth daily.   Yes [provider]  spironolactone (ALDACTONE) 25 MG tablet TAKE 1/2 TABLET BY MOUTH ONCE DAILY. 02/09/19  Yes Arnoldo Lenis, MD  aspirin EC 81 MG tablet Take 81 mg by mouth daily.    [provider]    Family History Family History  Problem Relation Age of Onset  . Heart attack Mother   . Cancer Mother   . Heart disease Mother   . Hypertension Other   . Cancer Sister   . Heart disease Maternal Grandmother   . Lung disease Neg Hx   . Rheumatologic disease Neg Hx     Social History Social History   Tobacco Use  . Smoking status: Passive Smoke Exposure - Never Smoker  . Smokeless tobacco: Never Used  . Tobacco comment: Husband smoked  Substance Use Topics  . Alcohol use: No    Alcohol/week: 0.0 standard drinks  . Drug use: No     Allergies   Latex, Bextra [valdecoxib], and Motrin  [ibuprofen]   Review of Systems Review of Systems  Constitutional: Positive for fever.  Respiratory: Positive for cough and shortness of breath.   All other systems reviewed and are negative.    Physical Exam Updated Vital Signs BP (!) 163/76   Pulse 69   Temp 98.3 F (36.8 C) (Rectal)   Resp (!) 21   Ht 5\' 3"  (1.6 m)   Wt 61.2 kg   SpO2 93%   BMI 23.91  kg/m   Physical Exam Vitals signs and nursing note reviewed.  Constitutional:      Comments: Elderly female with increased respiratory effort  HENT:     Head: Normocephalic and atraumatic.  Eyes:     Conjunctiva/sclera: Conjunctivae normal.     Pupils: Pupils are equal, round, and reactive to light.  Neck:     Musculoskeletal: Normal range of motion and neck supple.  Cardiovascular:     Rate and Rhythm: Normal rate and regular rhythm.  Pulmonary:     Effort: Tachypnea and accessory muscle usage present. No respiratory distress.     Breath sounds: Normal breath sounds. No wheezing.     Comments: Increased respiratory effort without signs of respiratory failure.  Tachypneic.  Room air sats of 79%, improved to 92 on 2 L of oxygen.  Lung sounds clear. Abdominal:     General: There is no distension.     Palpations: Abdomen is soft. There is no mass.     Tenderness: There is no abdominal tenderness. There is no guarding or rebound.  Musculoskeletal: Normal range of motion.  Skin:    General: Skin is warm and dry.     Capillary Refill: Capillary refill takes less than 2 seconds.  Neurological:     Mental Status: She is alert and oriented to person, place, and time.      ED Treatments / Results  Labs (all labs ordered are listed, but only abnormal results are displayed) Labs Reviewed  CBC WITH DIFFERENTIAL/PLATELET - Abnormal; Notable for the following components:      Result Value   WBC 13.7 (*)    Hemoglobin 11.1 (*)    HCT 34.2 (*)    Platelets 618 (*)    Neutro Abs 12.0 (*)    Abs Immature Granulocytes  0.08 (*)    All other components within normal limits  COMPREHENSIVE METABOLIC PANEL - Abnormal; Notable for the following components:   Sodium 123 (*)    Chloride 89 (*)    Glucose, Bld 199 (*)    Albumin 3.1 (*)    Total Bilirubin 0.1 (*)    GFR calc non Af Amer 57 (*)    All other components within normal limits  D-DIMER, QUANTITATIVE (NOT AT [facility]) - Abnormal; Notable for the following components:   D-Dimer, Quant 2.61 (*)    All other components within normal limits  FIBRINOGEN - Abnormal; Notable for the following components:   Fibrinogen >800 (*)    All other components within normal limits  C-REACTIVE PROTEIN - Abnormal; Notable for the following components:   CRP 27.1 (*)    All other components within normal limits  URINALYSIS, ROUTINE W REFLEX MICROSCOPIC - Abnormal; Notable for the following components:   Glucose, UA >=500 (*)    Protein, ur 30 (*)    All other components within normal limits  CULTURE, BLOOD (ROUTINE X 2)  CULTURE, BLOOD (ROUTINE X 2)  SARS CORONAVIRUS 2 (HOSPITAL ORDER, [place] [facility])  URINE CULTURE  SARS CORONAVIRUS 2 (TAT 6-24 HRS)  LACTIC ACID, PLASMA  LACTIC ACID, PLASMA  PROCALCITONIN  LACTATE DEHYDROGENASE  FERRITIN  TRIGLYCERIDES  BRAIN NATRIURETIC PEPTIDE  OSMOLALITY  OSMOLALITY, URINE  SODIUM, URINE, RANDOM    EKG EKG Interpretation  Date/Time:  Saturday September 09 2019 16:28:59 EDT Ventricular Rate:  85 PR Interval:    QRS Duration: 105 QT Interval:  385 QTC Calculation: 458 R Axis:   -32 Text Interpretation:  Sinus rhythm  LVH with secondary repolarization abnormality since last tracing no significant change Confirmed by Daleen Bo [phone]) on 09/08/2019 7:49:30 PM   Radiology Ct Angio Chest Pe W And/or Wo Contrast  Result Date: 09/10/2019 CLINICAL DATA:  Shortness of breath EXAM: CT ANGIOGRAPHY CHEST WITH CONTRAST TECHNIQUE: Multidetector CT imaging of the chest was performed using the standard  protocol during bolus administration of intravenous contrast. Multiplanar CT image reconstructions and MIPs were obtained to evaluate the vascular anatomy. CONTRAST:  127mL OMNIPAQUE IOHEXOL 350 MG/ML SOLN COMPARISON:  June 21, 2017. FINDINGS: Cardiovascular: Contrast injection is sufficient to demonstrate satisfactory opacification of the pulmonary arteries to the segmental level. There is no pulmonary embolus. The main pulmonary artery is within normal limits for size. There is no CT evidence of acute right heart strain. The visualized aorta demonstrates mild atherosclerotic changes. Heart size is the heart size is mildly enlarged. There is no significant pericardial effusion. Mediastinum/Nodes: --No mediastinal or hilar lymphadenopathy. --No axillary lymphadenopathy. --No supraclavicular lymphadenopathy. --Normal thyroid gland. --The esophagus is unremarkable Lungs/Pleura: Again noted is scarring and traction bronchiectasis within the lingula presumably related to the patient's reported history of breast radiation. There are trace bilateral pleural effusions, left greater than right. There are areas of ground-glass opacification primarily within the right upper lobe, right middle lobe and bilateral lower lobes. These areas demonstrate interlobular septal thickening. There is no pneumothorax. The trachea is unremarkable. Upper Abdomen: No acute abnormality. Musculoskeletal: No chest wall abnormality. No acute or significant osseous findings. Review of the MIP images confirms the above findings. IMPRESSION: 1. There is no evidence for pulmonary embolus. 2. There are areas of ground-glass opacification primarily within the right upper lobe, right middle lobe and bilateral lower lobes. These areas demonstrate interlobular septal thickening. These findings are nonspecific and may be related to pulmonary edema, acute interstitial pneumonia, drug toxicity, versus less likely diffuse alveolar hemorrhage. 3. Trace  bilateral pleural effusions, left greater than right. 4. Redemonstration of scarring and traction bronchiectasis within the lingula presumably related to the patient's reported history of breast radiation. Aortic Atherosclerosis (ICD10-I70.0). Electronically Signed   By: Constance Holster M.D.   On: 08/28/2019 18:59   Dg Chest Port 1 View  Result Date: 09/11/2019 CLINICAL DATA:  Hypoxia, shortness of breath EXAM: PORTABLE CHEST 1 VIEW COMPARISON:  04/26/2018 FINDINGS: Mild cardiomegaly, stable. Calcific aortic knob. Diffuse bilateral interstitial opacities, slightly more confluent within the right upper lobe. No pleural effusion. No pneumothorax. IMPRESSION: Diffuse bilateral interstitial opacities with a more confluent opacity in the right upper lobe. Findings favored to represent an atypical or viral pneumonia. Diffuse interstitial edema could have a similar appearance. Electronically Signed   By: Davina Poke M.D.   On: 09/03/2019 17:03    Procedures Procedures (including critical care time)  Medications Ordered in ED Medications  cefTRIAXone (ROCEPHIN) 2 g in sodium chloride 0.9 % 100 mL IVPB (2 g Intravenous New Bag/Given 08/30/2019 1804)  azithromycin (ZITHROMAX) 500 mg in sodium chloride 0.9 % 250 mL IVPB (500 mg Intravenous New Bag/Given 08/31/2019 1805)  iohexol (OMNIPAQUE) 350 MG/ML injection 100 mL (100 mLs Intravenous Contrast Given 09/10/2019 1828)  sodium chloride 0.9 % bolus 500 mL (500 mLs Intravenous New Bag/Given 09/17/2019 1957)     Initial Impression / Assessment and Plan / ED Course  I have reviewed the triage vital signs and the nursing notes.  Pertinent labs & imaging results that were available during my care of the patient were reviewed by me and considered in my medical  decision making (see chart for details).        Patient presenting for evaluation of shortness of breath.  Physical exam shows patient who is tachypneic with accessory muscle use.  Initial sats of 79% on  room air.  When placed on oxygen, sats improved to low 90s.  Consider PE versus pneumonia versus viral illness such as COVID.  Will obtain labs, covid test, x-ray, EKG.  Labs show slight leukocytosis at 13.7.  Dimer elevated at 2.6.  As such, she will likely need CTA to rule out PE.  Chest x-ray viewed interpreted by me, shows multifocal pneumonia. abx started.  Patient with hyponatremia, elevated fibrinogen and CRP.  COVID negative.  CTA pending.  CTA negative for PE.  Shows possible pneumonia versus pulmonary edema.  On reassessment, patient reports she is feeling better.  Her respiratory rate is improved to the low 20s, remained stable on oxygen. Will call for admission.   Discussed with Dr. Darrick Meigs from [facility], who requests a BNP.   BNP negative. Dr. Darrick Meigs concerned about pt's elevated inflammatory markers. Consider false negative covid. Per Dr. Darrick Meigs who spoke to the [facility], will order repeat covid test and pt to be admitted to [facility] as a PUI.    Final Clinical Impressions(s) / ED Diagnoses   Final diagnoses:  Community acquired pneumonia, unspecified laterality  Hypoxia    ED Discharge Orders    None       Franchot Heidelberg, PA-C 09/20/2019 2058    Daleen Bo, MD 09/12/19 [phone]

## 2019-09-09 NOTE — ED Notes (Signed)
Pt due to be transferred to [facility] due to suspicion of Covid in spite of neg test

## 2019-09-09 NOTE — ED Provider Notes (Signed)
    Face-to-face evaluation   History: She presents for evaluation of shortness of breath with cough and fever.  She is using Tylenol, to control fever symptoms.  Physical exam: Alert, calm, good.  Mild tachypnea.  Scattered rhonchi.  Extremities without peripheral edema.  Medical screening examination/treatment/procedure(s) were conducted as a shared visit with non-physician practitioner(s) and myself.  I personally evaluated the patient during the encounter    Daleen Bo, MD 09/12/19 [phone]

## 2019-09-09 NOTE — H&P (Addendum)
TRH H&P    Patient Demographics:    Charlotte [name], is a 85 y.o. female  MRN: [id number]  DOB - 1928-01-26  Admit Date - 09/10/2019  Referring MD/NP/PA: Franchot Heidelberg  Outpatient Primary MD for the patient is Rosita Fire, MD  Patient coming from: Home  Chief complaint-cough shortness of breath   HPI:    Charlotte [name]  is a 85 y.o. female, with history of sleep apnea on CPAP, breast cancerStatus post bilateral lumpectomy on Arimidex, hypertension, pulmonary hypertension, depression came to hospital with complaints of shortness of breath.  Patient complains of mild nonproductive cough, she has chronic postnasal drip.  She admits to having fever at home but does not remember how high it was.  Patient took Tylenol 1500 mg prior to calling EMS. Denies sore throat, chest pain Denies nausea vomiting or diarrhea. Denies abdominal pain or dysuria Denies contact with COVID-19 positive person Patient lives at home with husband and son and no one is sick at home.  In the ED COVID-19 test was negative Inflammatory markers were LDH 177, BNP 67, triglyceride 33, ferritin 209, CRP 27.1,         pro calcitonin less than 0.10, D-dimer 2.61, fibrinogen greater than 800 CT chest showed no evidence of pulmonary embolism, areas of groundglass opacification in the right upper lobe, right middle lobe and bilateral lower lobes.   Review of systems:    In addition to the HPI above,    All other systems reviewed and are negative.    Past History of the following :    Past Medical History:  Diagnosis Date  . Anxiety   . Arthritis   . Back pain   . Cancer ([place])    breast cancer x 2  . CHF (congestive heart failure) ([place])   . Depression   . Fatigue   . History of hyperthyroidism    resolved, no medicaions for treatment at this time  . Hypertension   . Insomnia   . OSA (obstructive sleep apnea)   . Personal history  of radiation therapy 2017  . Pulmonary hypertension ([place])       Past Surgical History:  Procedure Laterality Date  . BREAST LUMPECTOMY     right side  . BREAST LUMPECTOMY WITH AXILLARY LYMPH NODE BIOPSY Left 06/17/2016   Procedure: BREAST LUMPECTOMY WITH AXILLARY LYMPH NODE BIOPSY;  Surgeon: Autumn Messing III, MD;  Location: [facility];  Service: General;  Laterality: Left;  . COLONOSCOPY    . DILATION AND CURETTAGE OF UTERUS    . EYE SURGERY     bilateral cataracts removed  . FOOT SURGERY    . PORTACATH PLACEMENT Right 07/16/2016   Procedure: INSERTION PORT-A-CATH RIGHT SUBLCLAVIAN;  Surgeon: Autumn Messing III, MD;  Location: [facility];  Service: General;  Laterality: Right;  . TONSILLECTOMY        Social History:      Social History   Tobacco Use  . Smoking status: Passive Smoke Exposure - Never Smoker  . Smokeless tobacco: Never Used  . Tobacco  comment: Husband smoked  Substance Use Topics  . Alcohol use: No    Alcohol/week: 0.0 standard drinks       Family History :     Family History  Problem Relation Age of Onset  . Heart attack Mother   . Cancer Mother   . Heart disease Mother   . Hypertension Other   . Cancer Sister   . Heart disease Maternal Grandmother   . Lung disease Neg Hx   . Rheumatologic disease Neg Hx       Home Medications:   Prior to Admission medications   Medication Sig Start Date End Date Taking? Authorizing Provider  anastrozole (ARIMIDEX) 1 MG tablet Take 0.5 tablets (0.5 mg total) by mouth daily. 09/29/18  Yes Nicholas Lose, MD  BELSOMRA 5 MG TABS Take 1 tablet by mouth daily. 09/02/17  Yes [provider]  Calcium Carbonate-Vitamin D (CALCIUM 600+D3 PO) Take 1 tablet by mouth 2 (two) times daily.   Yes [provider]  carbamide peroxide (DEBROX) 6.5 % otic solution Place 5 drops into both ears daily as needed (ear wax buildup).    Yes [provider]  cycloSPORINE (RESTASIS) 0.05 % ophthalmic emulsion Place 1 drop into  both eyes 2 (two) times daily.   Yes [provider]  felodipine (PLENDIL) 10 MG 24 hr tablet Take 1 tablet (10 mg total) by mouth daily. 10/06/17  Yes BranchAlphonse Guild, MD  fluticasone (FLONASE) 50 MCG/ACT nasal spray Place 2 sprays into the nose daily as needed for allergies.   Yes [provider]  hydrALAZINE (APRESOLINE) 25 MG tablet Take 1 tablet (25 mg total) by mouth 3 (three) times daily. 10/06/17 08/27/2019 Yes BranchAlphonse Guild, MD  losartan (COZAAR) 100 MG tablet Take 100 mg by mouth daily.   Yes [provider]  Omega-3 Fatty Acids (FISH OIL TRIPLE STRENGTH) 1400 MG CAPS Take 1 tablet by mouth daily.   Yes [provider]  Polyethyl Glycol-Propyl Glycol (SYSTANE) 0.4-0.3 % SOLN Apply 1 drop to eye 2 (two) times daily.   Yes [provider]  potassium chloride SA (K-DUR,KLOR-CON) 20 MEQ tablet TAKE (1) TABLET BY MOUTH DAILY. 10/28/18  Yes BranchAlphonse Guild, MD  sertraline (ZOLOFT) 50 MG tablet Take 50 mg by mouth daily.   Yes [provider]  spironolactone (ALDACTONE) 25 MG tablet TAKE 1/2 TABLET BY MOUTH ONCE DAILY. 02/09/19  Yes Arnoldo Lenis, MD  aspirin EC 81 MG tablet Take 81 mg by mouth daily.    [provider]     Allergies:     Allergies  Allergen Reactions  . Latex Swelling    SEVERITY OF SWELLING NOT DEFINED.  Marland Kitchen Bextra [Valdecoxib] Other (See Comments)    Stomach Pains  . Motrin [Ibuprofen] Other (See Comments)    Stomach pain     Physical Exam:   Vitals  Blood pressure (!) 163/76, pulse 69, temperature 98.3 F (36.8 C), temperature source Rectal, resp. rate (!) 21, height 5\' 3"  (1.6 m), weight 61.2 kg, SpO2 93 %.  1.  General: Appears in no acute distress  2. Psychiatric: Alert, oriented x3, intact insight and judgment  3. Neurologic: Cranial nerves II through XII grossly intact, moving all extremities  4. HEENMT:  Atraumatic normocephalic, extraocular muscles are intact  5.  Respiratory : Scattered rhonchi bilaterally  6. Cardiovascular : S1-S2, regular, no murmur auscultated  7. Gastrointestinal:  Abdomen is soft, nontender, no organomegaly      Data  Review:    CBC Recent Labs  Lab 09/17/2019 1638  WBC 13.7*  HGB 11.1*  HCT 34.2*  PLT 618*  MCV 88.1  MCH 28.6  MCHC 32.5  RDW 14.0  LYMPHSABS 0.7  MONOABS 0.9  EOSABS 0.0  BASOSABS 0.0   ------------------------------------------------------------------------------------------------------------------  Results for orders placed or performed during the hospital encounter of 09/03/2019 (from the past 48 hour(s))  Lactic acid, plasma     Status: None   Collection Time: 09/18/2019  4:26 PM  Result Value Ref Range   Lactic Acid, Venous 1.7 0.5 - 1.9 mmol/L    Comment: Performed at [facility], [address], [place], [place] [address]  Urinalysis, Routine w reflex microscopic     Status: Abnormal   Collection Time: 09/17/2019  4:36 PM  Result Value Ref Range   Color, Urine YELLOW YELLOW   APPearance CLEAR CLEAR   Specific Gravity, Urine 1.014 1.005 - 1.030   pH 5.0 5.0 - 8.0   Glucose, UA >=500 (A) NEGATIVE mg/dL   Hgb urine dipstick NEGATIVE NEGATIVE   Bilirubin Urine NEGATIVE NEGATIVE   Ketones, ur NEGATIVE NEGATIVE mg/dL   Protein, ur 30 (A) NEGATIVE mg/dL   Nitrite NEGATIVE NEGATIVE   Leukocytes,Ua NEGATIVE NEGATIVE   RBC / HPF 0-5 0 - 5 RBC/hpf   WBC, UA 0-5 0 - 5 WBC/hpf   Bacteria, UA NONE SEEN NONE SEEN    Comment: Performed at [facility], [address], [place], [place] [address]  Blood Culture (routine x 2)     Status: None (Preliminary result)   Collection Time: 09/02/2019  4:38 PM   Specimen: BLOOD RIGHT FOREARM  Result Value Ref Range   Specimen Description      BLOOD RIGHT FOREARM BOTTLES DRAWN AEROBIC AND ANAEROBIC   Special Requests      Blood Culture adequate volume Performed at [facility], [address] [place], [place] [address]    Culture PENDING    Report  Status PENDING   CBC WITH DIFFERENTIAL     Status: Abnormal   Collection Time: 09/03/2019  4:38 PM  Result Value Ref Range   WBC 13.7 (H) 4.0 - 10.5 K/uL   RBC 3.88 3.87 - 5.11 MIL/uL   Hemoglobin 11.1 (L) 12.0 - 15.0 g/dL   HCT 34.2 (L) 36.0 - 46.0 %   MCV 88.1 80.0 - 100.0 fL   MCH 28.6 26.0 - 34.0 pg   MCHC 32.5 30.0 - 36.0 g/dL   RDW 14.0 11.5 - 15.5 %   Platelets 618 (H) 150 - 400 K/uL   nRBC 0.0 0.0 - 0.2 %   Neutrophils Relative % 87 %   Neutro Abs 12.0 (H) 1.7 - 7.7 K/uL   Lymphocytes Relative 5 %   Lymphs Abs 0.7 0.7 - 4.0 K/uL   Monocytes Relative 7 %   Monocytes Absolute 0.9 0.1 - 1.0 K/uL   Eosinophils Relative 0 %   Eosinophils Absolute 0.0 0.0 - 0.5 K/uL   Basophils Relative 0 %   Basophils Absolute 0.0 0.0 - 0.1 K/uL   Immature Granulocytes 1 %   Abs Immature Granulocytes 0.08 (H) 0.00 - 0.07 K/uL    Comment: Performed at [facility], [address] [place], [place] [address]  Comprehensive metabolic panel     Status: Abnormal   Collection Time: 08/27/2019  4:38 PM  Result Value Ref Range   Sodium 123 (L) 135 - 145 mmol/L   Potassium 3.9 3.5 - 5.1 mmol/L  Chloride 89 (L) 98 - 111 mmol/L   CO2 24 22 - 32 mmol/L   Glucose, Bld 199 (H) 70 - 99 mg/dL   BUN 22 8 - 23 mg/dL   Creatinine, Ser 0.90 0.44 - 1.00 mg/dL   Calcium 9.2 8.9 - 10.3 mg/dL   Total Protein 7.6 6.5 - 8.1 g/dL   Albumin 3.1 (L) 3.5 - 5.0 g/dL   AST 23 15 - 41 U/L   ALT 18 0 - 44 U/L   Alkaline Phosphatase 83 38 - 126 U/L   Total Bilirubin 0.1 (L) 0.3 - 1.2 mg/dL   GFR calc non Af Amer 57 (L) >60 mL/min   GFR calc Af Amer >60 >60 mL/min   Anion gap 10 5 - 15    Comment: Performed at [facility], [address] [place], [place] [address]  D-dimer, quantitative     Status: Abnormal   Collection Time: 09/02/2019  4:38 PM  Result Value Ref Range   D-Dimer, Quant 2.61 (H) 0.00 - 0.50 ug/mL-FEU    Comment: (NOTE) At the manufacturer cut-off of 0.50 ug/mL FEU, this assay has been documented to  exclude PE with a sensitivity and negative predictive value of 97 to 99%.  At this time, this assay has not been approved by the FDA to exclude DVT/VTE. Results should be correlated with clinical presentation. Performed at [facility], [address], [place],  [address]   Procalcitonin     Status: None   Collection Time: 09/08/2019  4:38 PM  Result Value Ref Range   Procalcitonin <0.10 ng/mL    Comment:        Interpretation: PCT (Procalcitonin) <= 0.5 ng/mL: Systemic infection (sepsis) is not likely. Local bacterial infection is possible. (NOTE)       Sepsis PCT Algorithm           Lower Respiratory Tract                                      Infection PCT Algorithm    ----------------------------     ----------------------------         PCT < 0.25 ng/mL                PCT < 0.10 ng/mL         Strongly encourage             Strongly discourage   discontinuation of antibiotics    initiation of antibiotics    ----------------------------     -----------------------------       PCT 0.25 - 0.50 ng/mL            PCT 0.10 - 0.25 ng/mL               OR       >80% decrease in PCT            Discourage initiation of                                            antibiotics      Encourage discontinuation           of antibiotics    ----------------------------     -----------------------------         PCT >= 0.50 ng/mL  PCT 0.26 - 0.50 ng/mL               AND        <80% decrease in PCT             Encourage initiation of                                             antibiotics       Encourage continuation           of antibiotics    ----------------------------     -----------------------------        PCT >= 0.50 ng/mL                  PCT > 0.50 ng/mL               AND         increase in PCT                  Strongly encourage                                      initiation of antibiotics    Strongly encourage escalation           of antibiotics                                      -----------------------------                                           PCT <= 0.25 ng/mL                                                 OR                                        > 80% decrease in PCT                                     Discontinue / Do not initiate                                             antibiotics Performed at [facility], [address], [place], [place] [address]   Lactate dehydrogenase     Status: None   Collection Time: 09/04/2019  4:38 PM  Result Value Ref Range   LDH 177 98 - 192 U/L    Comment: Performed at [facility], [address], [place], [place] [address]  Ferritin     Status: None   Collection Time: 09/06/2019  4:38 PM  Result Value Ref Range   Ferritin 209  11 - 307 ng/mL    Comment: Performed at [facility], [address], [place], [place] [address]  Triglycerides     Status: None   Collection Time: 08/25/2019  4:38 PM  Result Value Ref Range   Triglycerides 33 <150 mg/dL    Comment: Performed at [facility], [address] [place] [address] [place], [place] [address]  Fibrinogen     Status: Abnormal   Collection Time: 08/22/2019  4:38 PM  Result Value Ref Range   Fibrinogen >800 (H) 210 - 475 mg/dL    Comment: Performed at [facility], [address], [place], [place] [address]  C-reactive protein     Status: Abnormal   Collection Time: 09/15/2019  4:38 PM  Result Value Ref Range   CRP 27.1 (H) <1.0 mg/dL    Comment: Performed at [facility], [address] [place], [place] [address]  Brain natriuretic peptide     Status: None   Collection Time: 08/24/2019  4:38 PM  Result Value Ref Range   B Natriuretic Peptide 67.0 0.0 - 100.0 pg/mL    Comment: Performed at [facility], [address], [place], [place] [address]  Blood Culture (routine x 2)     Status: None (Preliminary result)   Collection Time: 08/26/2019  4:40 PM   Specimen: Left Antecubital; Blood  Result Value Ref Range   Specimen Description      LEFT ANTECUBITAL BOTTLES DRAWN AEROBIC AND  ANAEROBIC   Special Requests      Blood Culture adequate volume Performed at [facility], [address], [place], [place] [address]    Culture PENDING    Report Status PENDING   SARS Coronavirus 2 [facility] order, Performed in [facility] hospital lab) Nasopharyngeal Nasopharyngeal Swab     Status: None   Collection Time: 09/17/2019  4:55 PM   Specimen: Nasopharyngeal Swab  Result Value Ref Range   SARS Coronavirus 2 NEGATIVE NEGATIVE    Comment: (NOTE) If result is NEGATIVE SARS-CoV-2 target nucleic acids are NOT DETECTED. The SARS-CoV-2 RNA is generally detectable in upper and lower  respiratory specimens during the acute phase of infection. The lowest  concentration of SARS-CoV-2 viral copies this assay can detect is 250  copies / mL. A negative result does not preclude SARS-CoV-2 infection  and should not be used as the sole basis for treatment or other  patient management decisions.  A negative result may occur with  improper specimen collection / handling, submission of specimen other  than nasopharyngeal swab, presence of viral mutation(s) within the  areas targeted by this assay, and inadequate number of viral copies  (<250 copies / mL). A negative result must be combined with clinical  observations, patient history, and epidemiological information. If result is POSITIVE SARS-CoV-2 target nucleic acids are DETECTED. The SARS-CoV-2 RNA is generally detectable in upper and lower  respiratory specimens dur ing the acute phase of infection.  Positive  results are indicative of active infection with SARS-CoV-2.  Clinical  correlation with patient history and other diagnostic information is  necessary to determine patient infection status.  Positive results do  not rule out bacterial infection or co-infection with other viruses. If result is PRESUMPTIVE POSTIVE SARS-CoV-2 nucleic acids MAY BE PRESENT.   A presumptive positive result was obtained on the submitted specimen  and  confirmed on repeat testing.  While 2019 novel coronavirus  (SARS-CoV-2) nucleic acids may be present in the submitted sample  additional confirmatory testing may be necessary for epidemiological  and /  or clinical management purposes  to differentiate between  SARS-CoV-2 and other Sarbecovirus currently known to infect humans.  If clinically indicated additional testing with an alternate test  methodology [phone]) is advised. The SARS-CoV-2 RNA is generally  detectable in upper and lower respiratory sp ecimens during the acute  phase of infection. The expected result is Negative. Fact Sheet for Patients:  StrictlyIdeas.no Fact Sheet for Healthcare Providers: BankingDealers.co.za This test is not yet approved or cleared by the [place] FDA and has been authorized for detection and/or diagnosis of SARS-CoV-2 by FDA under an Emergency Use Authorization (EUA).  This EUA will remain in effect (meaning this test can be used) for the duration of the COVID-19 declaration under Section 564(b)(1) of the Act, 21 U.S.C. section 360bbb-3(b)(1), unless the authorization is terminated or revoked sooner. Performed at [facility], [address], [place], [place] [address]   Lactic acid, plasma     Status: None   Collection Time: 09/01/2019  6:56 PM  Result Value Ref Range   Lactic Acid, Venous 1.2 0.5 - 1.9 mmol/L    Comment: Performed at [facility], [address] [place], [place] [address]    Chemistries  Recent Labs  Lab 09/16/2019 1638  NA 123*  K 3.9  CL 89*  CO2 24  GLUCOSE 199*  BUN 22  CREATININE 0.90  CALCIUM 9.2  AST 23  ALT 18  ALKPHOS 83  BILITOT 0.1*   ------------------------------------------------------------------------------------------------------------------  ------------------------------------------------------------------------------------------------------------------ GFR: Estimated Creatinine Clearance:  35.1 mL/min (by C-G formula based on SCr of 0.9 mg/dL). Liver Function Tests: Recent Labs  Lab 08/29/2019 1638  AST 23  ALT 18  ALKPHOS 83  BILITOT 0.1*  PROT 7.6  ALBUMIN 3.1*   No results for input(s): LIPASE, AMYLASE in the last 168 hours. No results for input(s): AMMONIA in the last 168 hours. Coagulation Profile: No results for input(s): INR, PROTIME in the last 168 hours. Cardiac Enzymes: No results for input(s): CKTOTAL, CKMB, CKMBINDEX, TROPONINI in the last 168 hours. BNP (last 3 results) No results for input(s): PROBNP in the last 8760 hours. HbA1C: No results for input(s): HGBA1C in the last 72 hours. CBG: No results for input(s): GLUCAP in the last 168 hours. Lipid Profile: Recent Labs    09/02/2019 1638  TRIG 33   Thyroid Function Tests: No results for input(s): TSH, T4TOTAL, FREET4, T3FREE, THYROIDAB in the last 72 hours. Anemia Panel: Recent Labs    08/31/2019 1638  FERRITIN 209    --------------------------------------------------------------------------------------------------------------- Urine analysis:    Component Value Date/Time   COLORURINE YELLOW 09/14/2019 1636   APPEARANCEUR CLEAR 09/11/2019 1636   LABSPEC 1.014 08/29/2019 1636   PHURINE 5.0 08/27/2019 1636   GLUCOSEU >=500 (A) 09/15/2019 1636   HGBUR NEGATIVE 09/08/2019 1636   BILIRUBINUR NEGATIVE 08/26/2019 1636   KETONESUR NEGATIVE 09/08/2019 1636   PROTEINUR 30 (A) 08/30/2019 1636   UROBILINOGEN 0.2 10/14/2015 0824   NITRITE NEGATIVE 09/06/2019 1636   LEUKOCYTESUR NEGATIVE 08/31/2019 1636      Imaging Results:    Ct Angio Chest Pe W And/or Wo Contrast  Result Date: 09/01/2019 CLINICAL DATA:  Shortness of breath EXAM: CT ANGIOGRAPHY CHEST WITH CONTRAST TECHNIQUE: Multidetector CT imaging of the chest was performed using the standard protocol during bolus administration of intravenous contrast. Multiplanar CT image reconstructions and MIPs were obtained to evaluate the vascular  anatomy. CONTRAST:  153mL OMNIPAQUE IOHEXOL 350 MG/ML SOLN COMPARISON:  June 21, 2017. FINDINGS: Cardiovascular: Contrast injection is sufficient to demonstrate satisfactory  opacification of the pulmonary arteries to the segmental level. There is no pulmonary embolus. The main pulmonary artery is within normal limits for size. There is no CT evidence of acute right heart strain. The visualized aorta demonstrates mild atherosclerotic changes. Heart size is the heart size is mildly enlarged. There is no significant pericardial effusion. Mediastinum/Nodes: --No mediastinal or hilar lymphadenopathy. --No axillary lymphadenopathy. --No supraclavicular lymphadenopathy. --Normal thyroid gland. --The esophagus is unremarkable Lungs/Pleura: Again noted is scarring and traction bronchiectasis within the lingula presumably related to the patient's reported history of breast radiation. There are trace bilateral pleural effusions, left greater than right. There are areas of ground-glass opacification primarily within the right upper lobe, right middle lobe and bilateral lower lobes. These areas demonstrate interlobular septal thickening. There is no pneumothorax. The trachea is unremarkable. Upper Abdomen: No acute abnormality. Musculoskeletal: No chest wall abnormality. No acute or significant osseous findings. Review of the MIP images confirms the above findings. IMPRESSION: 1. There is no evidence for pulmonary embolus. 2. There are areas of ground-glass opacification primarily within the right upper lobe, right middle lobe and bilateral lower lobes. These areas demonstrate interlobular septal thickening. These findings are nonspecific and may be related to pulmonary edema, acute interstitial pneumonia, drug toxicity, versus less likely diffuse alveolar hemorrhage. 3. Trace bilateral pleural effusions, left greater than right. 4. Redemonstration of scarring and traction bronchiectasis within the lingula presumably related to  the patient's reported history of breast radiation. Aortic Atherosclerosis (ICD10-I70.0). Electronically Signed   By: Constance Holster M.D.   On: 09/12/2019 18:59   Dg Chest Port 1 View  Result Date: 09/18/2019 CLINICAL DATA:  Hypoxia, shortness of breath EXAM: PORTABLE CHEST 1 VIEW COMPARISON:  04/26/2018 FINDINGS: Mild cardiomegaly, stable. Calcific aortic knob. Diffuse bilateral interstitial opacities, slightly more confluent within the right upper lobe. No pleural effusion. No pneumothorax. IMPRESSION: Diffuse bilateral interstitial opacities with a more confluent opacity in the right upper lobe. Findings favored to represent an atypical or viral pneumonia. Diffuse interstitial edema could have a similar appearance. Electronically Signed   By: Davina Poke M.D.   On: 08/29/2019 17:03    My personal review of EKG: Rhythm NSR,    Assessment & Plan:    Active Problems:   Pneumonia   1. Atypical pneumonia-seen on the CT chest, initial COVID-19 is negative.  Patient started on ceftriaxone and Zithromax empirically.  Patient's inflammatory markers including elevated CRP 27, elevated d-dimer 2.61, with negative PE, normal BNP 67, new oxygen requirement, as patient was hypoxic in the ED is highly suspicious for COVID-19.  Will repeat COVID-19 test.  Patient will be transferred to [facility] as per son under investigation with repeat COVID-19 test pending.  2. Hypertension-continue hydralazine, losartan, Aldactone  3. History of breast cancer-continue Arimidex  4. Sleep apnea-continue CPAP nightly, Belsomra  5. Chronic diastolic CHF-euvolemic, BNP 67  6. Hyponatremia-sodium is 123, check serum osmolality, urine osmolality, urine sodium.  Fluid restriction 1500 mL/day.  Follow BMP in a.m.   DVT Prophylaxis-   Lovenox   AM Labs Ordered, also please review Full Orders  Family Communication: Admission, patients condition and plan of care including tests being ordered have been discussed  with the patient who indicate understanding and agree with the plan and Code Status.  Code Status: Full code  Admission status: Inpatient: Based on patients clinical presentation and evaluation of above clinical data, I have made determination that patient meets Inpatient criteria at this time.  Time spent in minutes :  60 minutes   Oswald Hillock M.D on 08/30/2019 at 8:54 PM

## 2019-09-10 LAB — SARS CORONAVIRUS 2 (TAT 6-24 HRS): SARS Coronavirus 2: NEGATIVE

## 2019-09-10 LAB — COMPREHENSIVE METABOLIC PANEL
ALT: 16 U/L (ref 0–44)
AST: 18 U/L (ref 15–41)
Albumin: 2.4 g/dL — ABNORMAL LOW (ref 3.5–5.0)
Alkaline Phosphatase: 75 U/L (ref 38–126)
Anion gap: 11 (ref 5–15)
BUN: 12 mg/dL (ref 8–23)
CO2: 25 mmol/L (ref 22–32)
Calcium: 8.8 mg/dL — ABNORMAL LOW (ref 8.9–10.3)
Chloride: 95 mmol/L — ABNORMAL LOW (ref 98–111)
Creatinine, Ser: 0.62 mg/dL (ref 0.44–1.00)
GFR calc Af Amer: >60 mL/min (ref >60)
GFR calc non Af Amer: >60 mL/min (ref >60)
Glucose, Bld: 147 mg/dL — ABNORMAL HIGH (ref 70–99)
Potassium: 3.7 mmol/L (ref 3.5–5.1)
Sodium: 131 mmol/L — ABNORMAL LOW (ref 135–145)
Total Bilirubin: 0.4 mg/dL (ref 0.3–1.2)
Total Protein: 6.5 g/dL (ref 6.5–8.1)

## 2019-09-10 LAB — CBC
HCT: 31.7 % — ABNORMAL LOW (ref 36.0–46.0)
Hemoglobin: 11 g/dL — ABNORMAL LOW (ref 12.0–15.0)
MCH: 29.8 pg (ref 26.0–34.0)
MCHC: 34.7 g/dL (ref 30.0–36.0)
MCV: 85.9 fL (ref 80.0–100.0)
Platelets: 558 10*3/uL — ABNORMAL HIGH (ref 150–400)
RBC: 3.69 MIL/uL — ABNORMAL LOW (ref 3.87–5.11)
RDW: 13.8 % (ref 11.5–15.5)
WBC: 14.4 10*3/uL — ABNORMAL HIGH (ref 4.0–10.5)
nRBC: 0 % (ref 0.0–0.2)

## 2019-09-10 LAB — OSMOLALITY: Osmolality: 268 mOsm/kg — ABNORMAL LOW (ref 275–295)

## 2019-09-10 MED ORDER — ACETAMINOPHEN 325 MG PO TABS: 650.0000 mg | ORAL_TABLET | Freq: Four times a day (QID) | ORAL | Status: DC | PRN

## 2019-09-10 MED ORDER — GUAIFENESIN ER 600 MG PO TB12
600.0000 mg | ORAL_TABLET | Freq: Two times a day (BID) | ORAL | Status: DC
  Administered 2019-09-10 – 2019-09-13 (×7): 600 mg via ORAL
  Filled 2019-09-10 (×7): qty 1

## 2019-09-10 MED ORDER — POLYETHYL GLYCOL-PROPYL GLYCOL 0.4-0.3 % OP SOLN: 1.0000 [drp] | Freq: Two times a day (BID) | OPHTHALMIC | Status: DC

## 2019-09-10 MED ORDER — FELODIPINE ER 5 MG PO TB24
10.0000 mg | ORAL_TABLET | Freq: Every day | ORAL | Status: DC
  Administered 2019-09-10 – 2019-09-19 (×7): 10 mg via ORAL
  Filled 2019-09-10 (×2): qty 2
  Filled 2019-09-10: qty 1
  Filled 2019-09-10 (×10): qty 2

## 2019-09-10 MED ORDER — ENOXAPARIN SODIUM 40 MG/0.4ML ~~LOC~~ SOLN
40.0000 mg | SUBCUTANEOUS | Status: DC
  Administered 2019-09-10 – 2019-09-12 (×3): 40 mg via SUBCUTANEOUS
  Filled 2019-09-10 (×3): qty 0.4

## 2019-09-10 MED ORDER — ONDANSETRON HCL 4 MG PO TABS: 4.0000 mg | ORAL_TABLET | Freq: Four times a day (QID) | ORAL | Status: DC | PRN

## 2019-09-10 MED ORDER — INFLUENZA VAC A&B SA ADJ QUAD 0.5 ML IM PRSY
0.5000 mL | PREFILLED_SYRINGE | INTRAMUSCULAR | Status: DC
  Filled 2019-09-10: qty 0.5

## 2019-09-10 MED ORDER — ONDANSETRON HCL 4 MG/2ML IJ SOLN
4.0000 mg | Freq: Four times a day (QID) | INTRAMUSCULAR | Status: DC | PRN
  Administered 2019-09-12: 4 mg via INTRAVENOUS
  Filled 2019-09-10: qty 2

## 2019-09-10 MED ORDER — SPIRONOLACTONE 12.5 MG HALF TABLET
12.5000 mg | ORAL_TABLET | Freq: Every day | ORAL | Status: DC
  Administered 2019-09-10 – 2019-09-15 (×6): 12.5 mg via ORAL
  Filled 2019-09-10 (×6): qty 1

## 2019-09-10 MED ORDER — LOSARTAN POTASSIUM 50 MG PO TABS
100.0000 mg | ORAL_TABLET | Freq: Every day | ORAL | Status: DC
  Administered 2019-09-10 – 2019-09-13 (×4): 100 mg via ORAL
  Filled 2019-09-10 (×4): qty 2

## 2019-09-10 MED ORDER — CARBAMIDE PEROXIDE 6.5 % OT SOLN: 5.0000 [drp] | Freq: Every day | OTIC | Status: DC | PRN

## 2019-09-10 MED ORDER — POLYVINYL ALCOHOL 1.4 % OP SOLN
1.0000 [drp] | Freq: Two times a day (BID) | OPHTHALMIC | Status: DC
  Administered 2019-09-10 – 2019-09-25 (×27): 1 [drp] via OPHTHALMIC
  Filled 2019-09-10 (×3): qty 15

## 2019-09-10 MED ORDER — ASPIRIN EC 81 MG PO TBEC
81.0000 mg | DELAYED_RELEASE_TABLET | Freq: Every day | ORAL | Status: DC
  Administered 2019-09-10 – 2019-09-17 (×8): 81 mg via ORAL
  Filled 2019-09-10 (×8): qty 1

## 2019-09-10 MED ORDER — SODIUM CHLORIDE 0.9 % IV SOLN
INTRAVENOUS | Status: DC
  Administered 2019-09-10: 07:00:00 via INTRAVENOUS

## 2019-09-10 MED ORDER — SUVOREXANT 5 MG PO TABS: 1.0000 | ORAL_TABLET | Freq: Every day | ORAL | Status: DC

## 2019-09-10 MED ORDER — HYDRALAZINE HCL 20 MG/ML IJ SOLN
10.0000 mg | Freq: Four times a day (QID) | INTRAMUSCULAR | Status: DC | PRN
  Administered 2019-09-15 – 2019-09-16 (×3): 10 mg via INTRAVENOUS
  Filled 2019-09-10 (×4): qty 1

## 2019-09-10 MED ORDER — CYCLOSPORINE 0.05 % OP EMUL
1.0000 [drp] | Freq: Two times a day (BID) | OPHTHALMIC | Status: DC
  Administered 2019-09-10 – 2019-09-16 (×13): 1 [drp] via OPHTHALMIC
  Administered 2019-09-17: 12:00:00 via OPHTHALMIC
  Administered 2019-09-17 – 2019-09-25 (×15): 1 [drp] via OPHTHALMIC
  Filled 2019-09-10 (×33): qty 30

## 2019-09-10 MED ORDER — SERTRALINE HCL 50 MG PO TABS
50.0000 mg | ORAL_TABLET | Freq: Every day | ORAL | Status: DC
  Administered 2019-09-10 – 2019-09-15 (×6): 50 mg via ORAL
  Filled 2019-09-10 (×6): qty 1

## 2019-09-10 MED ORDER — IPRATROPIUM BROMIDE 0.02 % IN SOLN: 0.5000 mg | Freq: Four times a day (QID) | RESPIRATORY_TRACT | Status: DC

## 2019-09-10 MED ORDER — ACETAMINOPHEN 650 MG RE SUPP: 650.0000 mg | Freq: Four times a day (QID) | RECTAL | Status: DC | PRN

## 2019-09-10 MED ORDER — ALBUTEROL SULFATE HFA 108 (90 BASE) MCG/ACT IN AERS
2.0000 | INHALATION_SPRAY | Freq: Four times a day (QID) | RESPIRATORY_TRACT | Status: DC
  Administered 2019-09-10 – 2019-09-12 (×8): 2 via RESPIRATORY_TRACT
  Filled 2019-09-10 (×2): qty 6.7

## 2019-09-10 MED ORDER — HYDRALAZINE HCL 25 MG PO TABS
25.0000 mg | ORAL_TABLET | Freq: Three times a day (TID) | ORAL | Status: DC
  Administered 2019-09-10 – 2019-09-13 (×9): 25 mg via ORAL
  Filled 2019-09-10 (×9): qty 1

## 2019-09-10 MED ORDER — ANASTROZOLE 1 MG PO TABS
0.5000 mg | ORAL_TABLET | Freq: Every day | ORAL | Status: DC
  Administered 2019-09-10 – 2019-09-19 (×9): 0.5 mg via ORAL
  Filled 2019-09-10 (×10): qty 1

## 2019-09-10 MED ORDER — ALBUTEROL SULFATE (2.5 MG/3ML) 0.083% IN NEBU: 2.5000 mg | INHALATION_SOLUTION | Freq: Four times a day (QID) | RESPIRATORY_TRACT | Status: DC

## 2019-09-10 NOTE — Progress Notes (Signed)
Pt desat. 69 on HFNC @ 10L. Applied nonrebreather @ 15L. Pt is now at 92%. Notified Dr Pietro Cassis via Shea Evans. Instructed to give hydralazine prn for sbp >160. Last sbp was 159. No hydralazine administered. Pt is resting comfortably on nonrebreather. Will continue to monitor pt.

## 2019-09-10 NOTE — Progress Notes (Signed)
Per note: "although initial test negative pt clinically suspected to have COVID-19" therefore, pt unable to wear CPAP until treated as negative.

## 2019-09-10 NOTE — Progress Notes (Signed)
PROGRESS NOTE  Charlotte [name]  DOB: 1928-04-22  PCP: Rosita Fire, MD SE:[id number]  DOA: 09/15/2019  LOS: 1 day   Brief narrative: Patient is a 85 y.o. female, with history of sleep apnea on CPAP, breast cancer s/p b/l lumpectomy on Arimidex, hypertension, pulmonary hypertension, depression who presented to [facility] ED on 9/19 with complaint of shortness of breath, mild cough and chronic postnasal drip.  Admits to having fever at home but did not measure. Patient took Tylenol 1500 mg prior to calling EMS. Denies contact with COVID-19 positive person Patient lives at home with husband and son and no one is sick at home.  In the ED COVID-19 test was negative Inflammatory markers were LDH 177, BNP 67, triglyceride 33, ferritin 209, CRP 27.1, pro calcitonin less than 0.10, D-dimer 2.61, fibrinogen greater than 800 CT chest showed no evidence of pulmonary embolism, areas of groundglass opacification in the right upper lobe, right middle lobe and bilateral lower lobes. Although the initial COVID-19 test was negative, patient was clinically suspected to have COVID-19 pneumonia and transferred to [facility]  Subjective: Patient was seen and examined this morning.  Pleasant elderly Caucasian female, sitting up at the edge of the bed.  On oxygen by nasal cannula.  Feels better than at presentation yesterday.  Assessment/Plan: Atypical pneumonia -Presented with subjective fever, shortness of breath, cough.   -CT chest finding as above with multilobar pneumonia. -Initial COVID-19 test negative.   -Inflammatory markers elevated.  Repeat COVID-19 testing ordered.  -Patient is currently on IV Rocephin and IV azithromycin.   Hyponatremia -sodium is 123 on admission.  Sodium was mildly low at 268, pending urine osmolality.  On admission, patient was put on fluid restriction at 1501/day.  Sodium level improved to 131 today.  Continue to monitor.  Hypertension-continue hydralazine, losartan, Aldactone   History of breast cancer-continue Arimidex  Sleep apnea-continue CPAP nightly, Belsomra  Chronic diastolic CHF-euvolemic, BNP 67  Mobility: Encourage ambulation DVT prophylaxis:  Lovenox Code Status:   Code Status: Full Code  Family Communication:  Expected Discharge:  Pending clinical course.  Consultants:    Procedures:    Antimicrobials: Anti-infectives (From admission, onward)   Start     Dose/Rate Route Frequency Ordered Stop   09/14/2019 1700  cefTRIAXone (ROCEPHIN) 2 g in sodium chloride 0.9 % 100 mL IVPB     2 g 200 mL/hr over 30 Minutes Intravenous Every 24 hours 09/04/2019 1655     09/06/2019 1700  azithromycin (ZITHROMAX) 500 mg in sodium chloride 0.9 % 250 mL IVPB     500 mg 250 mL/hr over 60 Minutes Intravenous Every 24 hours 09/11/2019 1655        Diet Order            Diet regular Room service appropriate? Yes; Fluid consistency: Thin  Diet effective now              Infusions:  . sodium chloride 10 mL/hr at 09/10/19 0631  . azithromycin Stopped (08/29/2019 2351)  . cefTRIAXone (ROCEPHIN)  IV Stopped (08/26/2019 2350)    Scheduled Meds: . albuterol  2 puff Inhalation Q6H  . anastrozole  0.5 mg Oral Daily  . aspirin EC  81 mg Oral Daily  . cycloSPORINE  1 drop Both Eyes BID  . enoxaparin (LOVENOX) injection  40 mg Subcutaneous Q24H  . felodipine  10 mg Oral Daily  . guaiFENesin  600 mg Oral BID  . hydrALAZINE  25 mg Oral TID  . [START ON 09/11/2019]  influenza vaccine adjuvanted  0.5 mL Intramuscular Tomorrow-1000  . ipratropium  0.5 mg Nebulization Q6H  . losartan  100 mg Oral Daily  . polyvinyl alcohol  1 drop Both Eyes BID  . sertraline  50 mg Oral Daily  . spironolactone  12.5 mg Oral Daily  . Suvorexant  1 tablet Oral Daily    PRN meds: acetaminophen **OR** acetaminophen, carbamide peroxide, ondansetron **OR** ondansetron (ZOFRAN) IV   Objective: Vitals:   09/10/19 0836 09/10/19 0945  BP: (!) 160/63 (!) 160/63  Pulse: 72   Resp: 16    Temp: 98.4 F (36.9 C)   SpO2: 90%     Intake/Output Summary (Last 24 hours) at 09/10/2019 1425 Last data filed at 09/08/2019 2351 Gross per 24 hour  Intake 350 ml  Output -  Net 350 ml   Filed Weights   09/10/2019 1625  Weight: 61.2 kg   Weight change:  Body mass index is 23.91 kg/m.   Physical Exam: General exam: Appears calm and comfortable.  Sitting up at the edge of the bed.  Not in distress Skin: No rashes, lesions or ulcers. HEENT: Atraumatic, normocephalic, supple neck, no obvious bleeding Lungs: Clear to auscultate bilaterally, CVS: Regular rate and rhythm, no murmur GI/Abd soft, nontender, nondistended, bowel sound present CNS: Alert, awake, oriented x3 Psychiatry: Mood appropriate Extremities: No edema, no calf tenderness  Data Review: I have personally reviewed the laboratory data and studies available.  Recent Labs  Lab 08/26/2019 1638 09/10/19 0746  WBC 13.7* 14.4*  NEUTROABS 12.0*  --   HGB 11.1* 11.0*  HCT 34.2* 31.7*  MCV 88.1 85.9  PLT 618* 558*   Recent Labs  Lab 09/07/2019 1638 09/10/19 0746  NA 123* 131*  K 3.9 3.7  CL 89* 95*  CO2 24 25  GLUCOSE 199* 147*  BUN 22 12  CREATININE 0.90 0.62  CALCIUM 9.2 8.8*    Terrilee Croak, MD  [facility] 09/10/2019

## 2019-09-11 LAB — LACTATE DEHYDROGENASE: LDH: 212 U/L — ABNORMAL HIGH (ref 98–192)

## 2019-09-11 LAB — C-REACTIVE PROTEIN: CRP: 29.5 mg/dL — ABNORMAL HIGH (ref <1.0)

## 2019-09-11 LAB — URINE CULTURE: Culture: NO GROWTH

## 2019-09-11 LAB — FERRITIN: Ferritin: 345 ng/mL — ABNORMAL HIGH (ref 11–307)

## 2019-09-11 MED ORDER — ZINC SULFATE 220 (50 ZN) MG PO CAPS
220.0000 mg | ORAL_CAPSULE | Freq: Every day | ORAL | Status: DC
  Administered 2019-09-11 – 2019-09-15 (×4): 220 mg via ORAL
  Filled 2019-09-11 (×5): qty 1

## 2019-09-11 MED ORDER — IPRATROPIUM BROMIDE HFA 17 MCG/ACT IN AERS
2.0000 | INHALATION_SPRAY | Freq: Four times a day (QID) | RESPIRATORY_TRACT | Status: DC
  Administered 2019-09-11 – 2019-09-12 (×5): 2 via RESPIRATORY_TRACT
  Filled 2019-09-11 (×2): qty 12.9

## 2019-09-11 MED ORDER — VITAMIN D 25 MCG (1000 UNIT) PO TABS
1000.0000 [IU] | ORAL_TABLET | Freq: Every day | ORAL | Status: DC
  Administered 2019-09-11 – 2019-09-19 (×7): 1000 [IU] via ORAL
  Filled 2019-09-11 (×8): qty 1

## 2019-09-11 MED ORDER — FUROSEMIDE 10 MG/ML IJ SOLN
40.0000 mg | Freq: Once | INTRAMUSCULAR | Status: AC
  Administered 2019-09-11: 40 mg via INTRAVENOUS
  Filled 2019-09-11: qty 4

## 2019-09-11 MED ORDER — VITAMIN C 500 MG PO TABS
1000.0000 mg | ORAL_TABLET | Freq: Every day | ORAL | Status: DC
  Administered 2019-09-11 – 2019-09-15 (×4): 1000 mg via ORAL
  Filled 2019-09-11 (×6): qty 2

## 2019-09-11 NOTE — Progress Notes (Signed)
PROGRESS NOTE  Charlotte [name]  DOB: 05/10/1928  PCP: Rosita Fire, MD SE:[id number]  DOA: 09/06/2019  LOS: 2 days   Brief narrative: Patient is a 85 y.o. female, with history of sleep apnea on CPAP, breast cancer s/p b/l lumpectomy on Arimidex, hypertension, pulmonary hypertension, depression who presented to [facility] ED on 9/19 with complaint of shortness of breath, mild cough and chronic postnasal drip.  Admits to having fever at home but did not measure. Patient took Tylenol 1500 mg prior to calling EMS. Denies contact with COVID-19 positive person Patient lives at home with husband and son and no one is sick at home.  In the ED COVID-19 test was negative Inflammatory markers were LDH 177, BNP 67, triglyceride 33, ferritin 209, CRP 27.1, pro calcitonin less than 0.10, D-dimer 2.61, fibrinogen greater than 800, procalcitonin less than 0.1. CT chest showed no evidence of pulmonary embolism, areas of groundglass opacification in the right upper lobe, right middle lobe and bilateral lower lobes. Although the initial COVID-19 test was negative, patient was clinically suspected to have COVID-19 pneumonia and transferred to [facility].  Subjective: Patient was seen and examined this morning.  Pleasant elderly Caucasian female, lying down in bed.  Not in distress.  However remains on 15 L oxygen via high flow nasal cannula.   Assessment/Plan: Atypical pneumonia Acute respiratory failure with hypoxia -Presented with subjective fever, shortness of breath, cough.   -CT chest finding as above with multilobar pneumonia. -Initial COVID-19 test negative.   -Inflammatory markers elevated.  Repeat COVID-19 testing was negative as well. -Patient was presumptively started on IV Rocephin and IV azithromycin.- -I suspect that the multilobar pneumonia read on CT scan is actually pulmonary edema. -Ordered for 1 dose of Lasix 40 mg this morning.  Repeat one this afternoon. -Clinically monitor.  Obtain BMP  tomorrow morning.  May repeat Lasix if clinically improving. -Currently on high flow oxygen at 15 L/min.  Wean down as tolerated.  Hyponatremia -sodium is 123 on admission.  Sodium was mildly low at 268, pending urine osmolality.  On admission, patient was put on fluid restriction at 1501/day.  Sodium level improved to 131 on last blood check on 9/20.  Repeat BMP tomorrow.  Chronic diastolic CHF/ Hypertension - continue hydralazine, felodipine, losartan, Aldactone.  Watch renal function with Lasix.  History of breast cancer-continue Arimidex  Sleep apnea-continue CPAP nightly, Belsomra  Mobility: Encourage ambulation DVT prophylaxis:  Lovenox Code Status:   Code Status: Full Code  Family Communication:  Expected Discharge:  Pending clinical course.  Consultants:    Procedures:    Antimicrobials: Anti-infectives (From admission, onward)   Start     Dose/Rate Route Frequency Ordered Stop   09/12/2019 1700  cefTRIAXone (ROCEPHIN) 2 g in sodium chloride 0.9 % 100 mL IVPB     2 g 200 mL/hr over 30 Minutes Intravenous Every 24 hours 09/06/2019 1655     09/01/2019 1700  azithromycin (ZITHROMAX) 500 mg in sodium chloride 0.9 % 250 mL IVPB     500 mg 250 mL/hr over 60 Minutes Intravenous Every 24 hours 09/07/2019 1655        Diet Order            Diet regular Room service appropriate? Yes; Fluid consistency: Thin  Diet effective now              Infusions:  . sodium chloride 10 mL/hr at 09/10/19 1934  . azithromycin Stopped (09/10/19 1802)  . cefTRIAXone (ROCEPHIN)  IV Stopped (09/10/19  1729)    Scheduled Meds: . albuterol  2 puff Inhalation Q6H  . anastrozole  0.5 mg Oral Daily  . aspirin EC  81 mg Oral Daily  . cholecalciferol  1,000 Units Oral Daily  . cycloSPORINE  1 drop Both Eyes BID  . enoxaparin (LOVENOX) injection  40 mg Subcutaneous Q24H  . felodipine  10 mg Oral Daily  . furosemide  40 mg Intravenous Once  . guaiFENesin  600 mg Oral BID  . hydrALAZINE  25 mg  Oral TID  . influenza vaccine adjuvanted  0.5 mL Intramuscular Tomorrow-1000  . ipratropium  2 puff Inhalation Q6H  . losartan  100 mg Oral Daily  . polyvinyl alcohol  1 drop Both Eyes BID  . sertraline  50 mg Oral Daily  . spironolactone  12.5 mg Oral Daily  . Suvorexant  1 tablet Oral Daily  . vitamin C  1,000 mg Oral Daily  . zinc sulfate  220 mg Oral Daily    PRN meds: acetaminophen **OR** acetaminophen, carbamide peroxide, hydrALAZINE, ondansetron **OR** ondansetron (ZOFRAN) IV   Objective: Vitals:   09/11/19 0800 09/11/19 1044  BP: (!) 165/75 (!) 150/70  Pulse: 81   Resp:    Temp: 98.9 F (37.2 C)   SpO2: 90%     Intake/Output Summary (Last 24 hours) at 09/11/2019 1355 Last data filed at 09/11/2019 0800 Gross per 24 hour  Intake 657 ml  Output -  Net 657 ml   Filed Weights   09/07/2019 1625  Weight: 61.2 kg   Weight change:  Body mass index is 23.91 kg/m.   Physical Exam: General exam: Appears calm and comfortable.  Sitting up at the edge of the bed.  Not in distress Skin: No rashes, lesions or ulcers. HEENT: Atraumatic, normocephalic, supple neck, no obvious bleeding Lungs: Clear to auscultation bilaterally, CVS: Regular rate and rhythm, no murmur GI/Abd soft, nontender, nondistended, bowel sound present CNS: Alert, awake, oriented x3 Psychiatry: Mood appropriate Extremities: No edema, no calf tenderness  Data Review: I have personally reviewed the laboratory data and studies available.  Recent Labs  Lab 09/08/2019 1638 09/10/19 0746  WBC 13.7* 14.4*  NEUTROABS 12.0*  --   HGB 11.1* 11.0*  HCT 34.2* 31.7*  MCV 88.1 85.9  PLT 618* 558*   Recent Labs  Lab 09/03/2019 1638 09/10/19 0746  NA 123* 131*  K 3.9 3.7  CL 89* 95*  CO2 24 25  GLUCOSE 199* 147*  BUN 22 12  CREATININE 0.90 0.62  CALCIUM 9.2 8.8*    Terrilee Croak, MD  [facility] 09/11/2019

## 2019-09-12 ENCOUNTER — Inpatient Hospital Stay (HOSPITAL_COMMUNITY): Payer: Medicare Other

## 2019-09-12 DIAGNOSIS — E876 Hypokalemia: Secondary | ICD-10-CM

## 2019-09-12 DIAGNOSIS — J15211 Pneumonia due to Methicillin susceptible Staphylococcus aureus: Secondary | ICD-10-CM

## 2019-09-12 DIAGNOSIS — I5031 Acute diastolic (congestive) heart failure: Secondary | ICD-10-CM

## 2019-09-12 DIAGNOSIS — J9690 Respiratory failure, unspecified, unspecified whether with hypoxia or hypercapnia: Secondary | ICD-10-CM

## 2019-09-12 LAB — BASIC METABOLIC PANEL
Anion gap: 11 (ref 5–15)
BUN: 17 mg/dL (ref 8–23)
CO2: 28 mmol/L (ref 22–32)
Calcium: 8.5 mg/dL — ABNORMAL LOW (ref 8.9–10.3)
Chloride: 91 mmol/L — ABNORMAL LOW (ref 98–111)
Creatinine, Ser: 0.87 mg/dL (ref 0.44–1.00)
GFR calc Af Amer: >60 mL/min (ref >60)
GFR calc non Af Amer: 59 mL/min — ABNORMAL LOW (ref >60)
Glucose, Bld: 133 mg/dL — ABNORMAL HIGH (ref 70–99)
Potassium: 2.9 mmol/L — ABNORMAL LOW (ref 3.5–5.1)
Sodium: 130 mmol/L — ABNORMAL LOW (ref 135–145)

## 2019-09-12 LAB — CBC WITH DIFFERENTIAL/PLATELET
Abs Immature Granulocytes: 0.07 10*3/uL (ref 0.00–0.07)
Basophils Absolute: 0 10*3/uL (ref 0.0–0.1)
Basophils Relative: 0 %
Eosinophils Absolute: 0.1 10*3/uL (ref 0.0–0.5)
Eosinophils Relative: 1 %
HCT: 29.6 % — ABNORMAL LOW (ref 36.0–46.0)
Hemoglobin: 9.8 g/dL — ABNORMAL LOW (ref 12.0–15.0)
Immature Granulocytes: 1 %
Lymphocytes Relative: 6 %
Lymphs Abs: 0.7 10*3/uL (ref 0.7–4.0)
MCH: 28.3 pg (ref 26.0–34.0)
MCHC: 33.1 g/dL (ref 30.0–36.0)
MCV: 85.5 fL (ref 80.0–100.0)
Monocytes Absolute: 0.6 10*3/uL (ref 0.1–1.0)
Monocytes Relative: 5 %
Neutro Abs: 11.6 10*3/uL — ABNORMAL HIGH (ref 1.7–7.7)
Neutrophils Relative %: 87 %
Platelets: 562 10*3/uL — ABNORMAL HIGH (ref 150–400)
RBC: 3.46 MIL/uL — ABNORMAL LOW (ref 3.87–5.11)
RDW: 13.9 % (ref 11.5–15.5)
WBC: 13.1 10*3/uL — ABNORMAL HIGH (ref 4.0–10.5)
nRBC: 0 % (ref 0.0–0.2)

## 2019-09-12 LAB — C-REACTIVE PROTEIN: CRP: 32.4 mg/dL — ABNORMAL HIGH (ref <1.0)

## 2019-09-12 LAB — FERRITIN: Ferritin: 404 ng/mL — ABNORMAL HIGH (ref 11–307)

## 2019-09-12 LAB — LACTATE DEHYDROGENASE: LDH: 208 U/L — ABNORMAL HIGH (ref 98–192)

## 2019-09-12 MED ORDER — POTASSIUM CHLORIDE CRYS ER 20 MEQ PO TBCR
40.0000 meq | EXTENDED_RELEASE_TABLET | ORAL | Status: AC
  Administered 2019-09-12 (×2): 40 meq via ORAL
  Filled 2019-09-12 (×2): qty 2

## 2019-09-12 MED ORDER — IPRATROPIUM-ALBUTEROL 0.5-2.5 (3) MG/3ML IN SOLN: 3.0000 mL | RESPIRATORY_TRACT | Status: DC | PRN

## 2019-09-12 MED ORDER — METOPROLOL TARTRATE 5 MG/5ML IV SOLN
5.0000 mg | INTRAVENOUS | Status: DC | PRN
  Administered 2019-09-15: 5 mg via INTRAVENOUS
  Filled 2019-09-12: qty 5

## 2019-09-12 MED ORDER — IPRATROPIUM-ALBUTEROL 0.5-2.5 (3) MG/3ML IN SOLN
3.0000 mL | Freq: Four times a day (QID) | RESPIRATORY_TRACT | Status: DC
  Administered 2019-09-12 – 2019-09-15 (×13): 3 mL via RESPIRATORY_TRACT
  Filled 2019-09-12 (×13): qty 3

## 2019-09-12 MED ORDER — FUROSEMIDE 10 MG/ML IJ SOLN
40.0000 mg | Freq: Once | INTRAMUSCULAR | Status: AC
  Administered 2019-09-12: 40 mg via INTRAVENOUS
  Filled 2019-09-12: qty 4

## 2019-09-12 NOTE — Progress Notes (Signed)
Spoke with RN and Rapid Response regarding patient's need for Heated HFNC.  Patient currently sating 91% on NRBM, in no distress.  Status currently falls within parameters to hold on Heated HFNC.  Will initiate Heated HFNC if necessary.  RT to follow.

## 2019-09-12 NOTE — Significant Event (Addendum)
Rapid Response Event Note  Overview:Called d/t increasing oxygen demand. Pt was on 15 HFNC with SpO2-86%. RN placed pt on 100% NRB with SpO2 increasing to 88-90%. Time Called: 0005 Arrival Time: 0010 Event Type: Respiratory  Initial Focused Assessment: Pt laying in bed in no distress, alert and oriented.  Denies SOB, chest pain. Lungs diminished t/o. T-98.4, HR-83, BP-146/76, RR-21, SpO2-88-91% on NRB.  Interventions: 40mg  Lasix x 1  Plan of Care (if not transferred): Continue to monitor pt. Call RRT if further assistance needed.   Update: 0145-SpO2-92% on NRB, UOP-600 after lasix. [place] ordered if needed.  Update: 0430-SpO2-81-85%, pt in no distress, sleeping, RR-24. RT notified to initiate [place].  Event Summary: Name of Physician Notified: Schorr, NP at (PTA RRT)    at    Outcome: Stayed in room and stabalized  Event End Time: 0020  Dillard [place]

## 2019-09-12 NOTE — Progress Notes (Signed)
Patient placed on heated HFNC d/t O2 sats low 80's.  Initiated at 100% 20L, with increase in sats to 94%  Patient is not sob or in any distress at this time.  RT to monitor.

## 2019-09-12 NOTE — Progress Notes (Signed)
RT NOTE:  RT entered room to assess patient for BiPAP according to the new order from MD.  Patient initial presentation was diaphoretic and exhibiting increased WOB.  Patient O2 saturation was 60% on heated high flow of 30 L and 100% FiO2.   RT requested a bedside pulse-ox as the tele monitor wasn't capturing the O2 SATS.  Patient was placed on BiPAP and RT changed pulse-ox probe and connected patient to the bedside monitor. Patient is currently tolerating BiPAP and O2 SATS are 99%.  RT made RN aware of changes and will continue to monitor.

## 2019-09-12 NOTE — Progress Notes (Signed)
..    Vital Signs MEWS/VS Documentation      09/12/2019 2022 09/12/2019 2052 09/12/2019 2232 09/12/2019 2325   MEWS Score:  -  1  1  2    MEWS Score Color:  -  Green  Green  Yellow   Resp:  -  -  (!) 24  (!) 28   Pulse:  -  -  81  80   BP:  -  -  138/63  -   Temp:  -  -  98.5 F (36.9 C)  -   O2 Device:  Bi-PAP  -  Bi-PAP  -   FiO2 (%):  100 %  -  -  -   Level of Consciousness:  -  Alert  -  -     Patient on currently on BiPAP.     Jacqulyn Ducking 09/12/2019,11:49 PM

## 2019-09-12 NOTE — Progress Notes (Addendum)
PROGRESS NOTE    Charlotte [name]  [id number] DOB: 03-Nov-1928 DOA: 08/31/2019 PCP: Rosita Fire, MD    Brief Narrative:  85 year old female who presented with dyspnea and cough, she has past medical history of breast cancer status post bilateral lumpectomy on Arimidex, hypertension, pulmonary hypertension and depression.  Reported dry cough, and fevers at home.  Her initial physical examination blood pressure 163/76, heart rate 69, temperature 98.3, respiratory rate 21.  She was awake and alert, lungs with scattered rhonchi bilaterally, heart S1-S2 present and rhythmic, abdomen soft, no extremity edema. Sodium 123, potassium 3.9, chloride 89, bicarb 24, glucose 199, BUN 22, creatinine 0.90, ferritin 289, CRP 27.1, procalcitonin <0.10, d dimer 2,61, fibrinogen > 800.  White count 13.7, hemoglobin 11.1, hematocrit 40.2, platelets 618, urinalysis negative for infection.  SARS COVID-19 negative.  Dense right upper lobe infiltrate, interstitial infiltrates bilateral lower lobes.  Chest negative for pulmonary embolism, right upper lobe infiltrate, right middle lobe and bilateral lower lobes infiltrates.  Scarring and bronchiectasis within the lingula.  EKG 85 bpm left axis deviation, normal intervals, sinus rhythm ST segment or T wave changes.  Patient was admitted to the hospital with a working diagnosis of multilobar pneumonia to rule out COVID-19 infection.  Because of concerns of COVID 19 pneumonia patient was transferred to [facility], and placed on respiratory isolation.   Second test for COVID 19 returned negative, patient received broad-spectrum antibiotic therapy along with furosemide for diuresis.  Patient has required high flow oxygen per nasal cannula, due to persistent hypoxic respiratory failure.  Assessment & Plan:   Principal Problem:   Respiratory failure ([place]) Active Problems:   Essential hypertension   Pneumonia   Acute diastolic CHF (congestive heart failure) (HCC)   Hypokalemia    1. Acute hypoxic respiratory failure due to multilobar pneumonia. Present on admission. Patient continue to have dyspnea and increased oxygen requirements, today on 20 LPM with Fi02 100% oxygen saturation is 91%. Will repeat chest film today, after diuresis. Cultures have been no growth. Persistent leukocytosis at 13.1, patient has been afebrile. Will continue antibiotic therapy with IV ceftriaxone and azithromycin. Continue close oxymetry monitoring. Tested negative x2 for COVID 19, will dc respiratory isolation. Continue bronchodilator therapy.   2. Acute on chronic diastolic heart failure exacerbation. Clinically euvolemic, patient has been diuresed with furosemide IV, fluid balance is positive per documentation. Will hold on further diuresis for now and will follow on chest film.   3. Hyponatremia and hypokalemia. Patient tolerating po well, had diuresis with furosemide, renal function today with Na at 130 with K at 2,9 and serum cr at 0,87. Will continue K correction with Kcl 80 meq in 2 divided doses today, follow on renal panel in am.   4. Hx of breast cancer. Patient on Arimidex.  5. OSA. On Cpap at night.   6. HTN, Blood pressure systolic 0000000 to [id number], will continue with hydralazine and carvedilol, losartan and spironolactone  DVT prophylaxis: enoxaparin   Code Status: full Family Communication: no family at the bedside  Disposition Plan/ discharge barriers: pending clinical improvement.   Body mass index is 23.91 kg/m. Malnutrition Type:      Malnutrition Characteristics:      Nutrition Interventions:     RN Pressure Injury Documentation:     Consultants:     Procedures:     Antimicrobials:   Ceftriaxone and Azithromycin.     Subjective: Patient continue to have significant dyspnea, worse with exertion and associated with cough, no nausea or  vomiting,. Continue to be very weak and deconditioned.   Objective: Vitals:   09/12/19 0434 09/12/19 0530  09/12/19 0600 09/12/19 0732  BP:    127/71  Pulse:    97  Resp: (!) 30  (!) 22 17  Temp:    98.9 F (37.2 C)  TempSrc:    Oral  SpO2: (!) 85% 94% 96% 93%  Weight:      Height:        Intake/Output Summary (Last 24 hours) at 09/12/2019 0752 Last data filed at 09/12/2019 0435 Gross per 24 hour  Intake 500 ml  Output 700 ml  Net -200 ml   Filed Weights   09/12/2019 1625  Weight: 61.2 kg    Examination:   General: deconditioned and ill looking appearing, dyspneic  Neurology: Awake and alert, non focal  E ENT: positive pallor, no icterus, oral mucosa moist/ mild accessory muscle use.  Cardiovascular: No JVD. S1-S2 present, rhythmic, no gallops, rubs, or murmurs. No lower extremity edema. Pulmonary: positive breath sounds bilaterally, bilateral rales. Gastrointestinal. Abdomen with no organomegaly, non tender, no rebound or guarding Skin. No rashes Musculoskeletal: no joint deformities     Data Reviewed: I have personally reviewed following labs and imaging studies  CBC: Recent Labs  Lab 09/10/2019 1638 09/10/19 0746 09/12/19 0600  WBC 13.7* 14.4* 13.1*  NEUTROABS 12.0*  --  11.6*  HGB 11.1* 11.0* 9.8*  HCT 34.2* 31.7* 29.6*  MCV 88.1 85.9 85.5  PLT 618* 558* [id number]*   Basic Metabolic Panel: Recent Labs  Lab 08/26/2019 1638 09/10/19 0746 09/12/19 0600  NA 123* 131* 130*  K 3.9 3.7 2.9*  CL 89* 95* 91*  CO2 24 25 28   GLUCOSE 199* 147* 133*  BUN 22 12 17   CREATININE 0.90 0.62 0.87  CALCIUM 9.2 8.8* 8.5*   GFR: Estimated Creatinine Clearance: 36.3 mL/min (by C-G formula based on SCr of 0.87 mg/dL). Liver Function Tests: Recent Labs  Lab 08/27/2019 1638 09/10/19 0746  AST 23 18  ALT 18 16  ALKPHOS 83 75  BILITOT 0.1* 0.4  PROT 7.6 6.5  ALBUMIN 3.1* 2.4*   No results for input(s): LIPASE, AMYLASE in the last 168 hours. No results for input(s): AMMONIA in the last 168 hours. Coagulation Profile: No results for input(s): INR, PROTIME in the last 168 hours.  Cardiac Enzymes: No results for input(s): CKTOTAL, CKMB, CKMBINDEX, TROPONINI in the last 168 hours. BNP (last 3 results) No results for input(s): PROBNP in the last 8760 hours. HbA1C: No results for input(s): HGBA1C in the last 72 hours. CBG: No results for input(s): GLUCAP in the last 168 hours. Lipid Profile: Recent Labs    09/17/2019 1638  TRIG 33   Thyroid Function Tests: No results for input(s): TSH, T4TOTAL, FREET4, T3FREE, THYROIDAB in the last 72 hours. Anemia Panel: Recent Labs    09/11/19 0942 09/12/19 0600  FERRITIN 345* 404*      Radiology Studies: I have reviewed all of the imaging during this hospital visit personally     Scheduled Meds: . albuterol  2 puff Inhalation Q6H  . anastrozole  0.5 mg Oral Daily  . aspirin EC  81 mg Oral Daily  . cholecalciferol  1,000 Units Oral Daily  . cycloSPORINE  1 drop Both Eyes BID  . enoxaparin (LOVENOX) injection  40 mg Subcutaneous Q24H  . felodipine  10 mg Oral Daily  . guaiFENesin  600 mg Oral BID  . hydrALAZINE  25 mg Oral TID  . influenza  vaccine adjuvanted  0.5 mL Intramuscular Tomorrow-1000  . ipratropium  2 puff Inhalation Q6H  . losartan  100 mg Oral Daily  . polyvinyl alcohol  1 drop Both Eyes BID  . sertraline  50 mg Oral Daily  . spironolactone  12.5 mg Oral Daily  . Suvorexant  1 tablet Oral Daily  . vitamin C  1,000 mg Oral Daily  . zinc sulfate  220 mg Oral Daily   Continuous Infusions: . sodium chloride 10 mL/hr at 09/10/19 1934  . azithromycin 500 mg (09/11/19 1932)  . cefTRIAXone (ROCEPHIN)  IV 2 g (09/11/19 1833)     LOS: 3 days        Mauricio Gerome Apley, MD

## 2019-09-12 NOTE — Care Management Important Message (Signed)
Important Message  Patient Details  Name: Charlotte [name] MRN: [id number] Date of Birth: 1928/07/20   Medicare Important Message Given:  Yes     [name] 09/12/2019, 4:03 PM

## 2019-09-12 NOTE — Progress Notes (Addendum)
0000: Pt desaturated down to 86% on 15L HFNC. Placed patient on NRB and is now saturating 88-90%. RR remains in low 20s which has been consistent throughout day. No shortness of breath reported. RT, RRRN, and K. Schorr notified. Schorr ordered 40mg  of lasix. Placed purewick and administrated Lasix. Will continue to monitor for changes.   0200: Output of 600 post lasix, continues to saturate 88-91%. Still no increased WOB or SOB. [place] notified. Asked about O2 saturation goal and possibly upgrading to Progressive.   0240: K.Schorr called back, would like to try Heated HFNC. Notified RT.  RT and RRRN conferred and decided HFNC not required at this time due to lack of SOB or WOB and saturations staying >88%.  0430: Pt now saturating at 85% at rest on NRB. RRRN and RT notified  0530: Heated HFNC placed at 20L 100%. Pt now resting comfortably at 96%. RR 22

## 2019-09-12 NOTE — Progress Notes (Signed)
Patient with oxygen desaturation down to 82%, on 30%Fi02, will place patient on Bipap and continue close monitoring.

## 2019-09-12 NOTE — Plan of Care (Signed)
  Problem: Education: Goal: Knowledge of risk factors and measures for prevention of condition will improve Outcome: Progressing   Problem: Coping: Goal: Psychosocial and spiritual needs will be supported Outcome: Progressing   Problem: Respiratory: Goal: Will maintain a patent airway Outcome: Progressing Goal: Complications related to the disease process, condition or treatment will be avoided or minimized Outcome: Progressing   

## 2019-09-12 NOTE — Progress Notes (Signed)
Pt in no distress, no increased wob, Spo2  82-84% on 15L [place] HFNC so was switched back to 20L/100% Heated HFNC at this time.

## 2019-09-13 ENCOUNTER — Inpatient Hospital Stay (HOSPITAL_COMMUNITY): Payer: Medicare Other

## 2019-09-13 DIAGNOSIS — I361 Nonrheumatic tricuspid (valve) insufficiency: Secondary | ICD-10-CM

## 2019-09-13 DIAGNOSIS — I351 Nonrheumatic aortic (valve) insufficiency: Secondary | ICD-10-CM

## 2019-09-13 DIAGNOSIS — R0902 Hypoxemia: Secondary | ICD-10-CM

## 2019-09-13 DIAGNOSIS — J9601 Acute respiratory failure with hypoxia: Secondary | ICD-10-CM

## 2019-09-13 LAB — SEDIMENTATION RATE: Sed Rate: 112 mm/hr — ABNORMAL HIGH (ref 0–22)

## 2019-09-13 LAB — BLOOD GAS, ARTERIAL
Acid-Base Excess: 5.8 mmol/L — ABNORMAL HIGH (ref 0.0–2.0)
Bicarbonate: 30.5 mmol/L — ABNORMAL HIGH (ref 20.0–28.0)
Delivery systems: POSITIVE
Drawn by: 12971
Expiratory PAP: 8
FIO2: 100
Inspiratory PAP: 12
O2 Saturation: 93.1 %
Patient temperature: 98.6
RATE: 8 resp/min
pCO2 arterial: 50.1 mmHg — ABNORMAL HIGH (ref 32.0–48.0)
pH, Arterial: 7.401 (ref 7.350–7.450)
pO2, Arterial: 69.7 mmHg — ABNORMAL LOW (ref 83.0–108.0)

## 2019-09-13 LAB — MRSA PCR SCREENING: MRSA by PCR: NEGATIVE

## 2019-09-13 LAB — CBC WITH DIFFERENTIAL/PLATELET
Abs Immature Granulocytes: 0.22 10*3/uL — ABNORMAL HIGH (ref 0.00–0.07)
Basophils Absolute: 0 10*3/uL (ref 0.0–0.1)
Basophils Relative: 0 %
Eosinophils Absolute: 0.2 10*3/uL (ref 0.0–0.5)
Eosinophils Relative: 2 %
HCT: 27.4 % — ABNORMAL LOW (ref 36.0–46.0)
Hemoglobin: 8.9 g/dL — ABNORMAL LOW (ref 12.0–15.0)
Immature Granulocytes: 2 %
Lymphocytes Relative: 7 %
Lymphs Abs: 0.9 10*3/uL (ref 0.7–4.0)
MCH: 28.3 pg (ref 26.0–34.0)
MCHC: 32.5 g/dL (ref 30.0–36.0)
MCV: 87.3 fL (ref 80.0–100.0)
Monocytes Absolute: 0.7 10*3/uL (ref 0.1–1.0)
Monocytes Relative: 5 %
Neutro Abs: 11.8 10*3/uL — ABNORMAL HIGH (ref 1.7–7.7)
Neutrophils Relative %: 84 %
Platelets: 529 10*3/uL — ABNORMAL HIGH (ref 150–400)
RBC: 3.14 MIL/uL — ABNORMAL LOW (ref 3.87–5.11)
RDW: 14.4 % (ref 11.5–15.5)
WBC: 13.8 10*3/uL — ABNORMAL HIGH (ref 4.0–10.5)
nRBC: 0 % (ref 0.0–0.2)

## 2019-09-13 LAB — BASIC METABOLIC PANEL
Anion gap: 12 (ref 5–15)
BUN: 33 mg/dL — ABNORMAL HIGH (ref 8–23)
CO2: 25 mmol/L (ref 22–32)
Calcium: 8.7 mg/dL — ABNORMAL LOW (ref 8.9–10.3)
Chloride: 93 mmol/L — ABNORMAL LOW (ref 98–111)
Creatinine, Ser: 1.07 mg/dL — ABNORMAL HIGH (ref 0.44–1.00)
GFR calc Af Amer: 53 mL/min — ABNORMAL LOW (ref >60)
GFR calc non Af Amer: 46 mL/min — ABNORMAL LOW (ref >60)
Glucose, Bld: 130 mg/dL — ABNORMAL HIGH (ref 70–99)
Potassium: 4.2 mmol/L (ref 3.5–5.1)
Sodium: 130 mmol/L — ABNORMAL LOW (ref 135–145)

## 2019-09-13 LAB — STREP PNEUMONIAE URINARY ANTIGEN: Strep Pneumo Urinary Antigen: NEGATIVE

## 2019-09-13 LAB — GLUCOSE, CAPILLARY: Glucose-Capillary: 146 mg/dL — ABNORMAL HIGH (ref 70–99)

## 2019-09-13 LAB — BRAIN NATRIURETIC PEPTIDE: B Natriuretic Peptide: 57.2 pg/mL (ref 0.0–100.0)

## 2019-09-13 MED ORDER — FUROSEMIDE 10 MG/ML IJ SOLN
40.0000 mg | Freq: Once | INTRAMUSCULAR | Status: AC
  Administered 2019-09-13: 13:00:00 40 mg via INTRAVENOUS
  Filled 2019-09-13: qty 4

## 2019-09-13 MED ORDER — CHLORHEXIDINE GLUCONATE CLOTH 2 % EX PADS
6.0000 | MEDICATED_PAD | Freq: Every day | CUTANEOUS | Status: DC
  Administered 2019-09-13 – 2019-09-25 (×13): 6 via TOPICAL

## 2019-09-13 MED ORDER — METHYLPREDNISOLONE SODIUM SUCC 40 MG IJ SOLR
40.0000 mg | Freq: Three times a day (TID) | INTRAMUSCULAR | Status: DC
  Administered 2019-09-13 – 2019-09-17 (×12): 40 mg via INTRAVENOUS
  Filled 2019-09-13 (×12): qty 1

## 2019-09-13 MED ORDER — WHITE PETROLATUM EX OINT
TOPICAL_OINTMENT | CUTANEOUS | Status: DC | PRN
  Filled 2019-09-13: qty 28.35

## 2019-09-13 MED ORDER — PERFLUTREN LIPID MICROSPHERE
1.0000 mL | INTRAVENOUS | Status: AC | PRN
  Administered 2019-09-13: 5 mL via INTRAVENOUS
  Filled 2019-09-13: qty 10

## 2019-09-13 MED ORDER — CHLORHEXIDINE GLUCONATE 0.12 % MT SOLN
15.0000 mL | Freq: Two times a day (BID) | OROMUCOSAL | Status: DC
  Administered 2019-09-13 – 2019-09-23 (×16): 15 mL via OROMUCOSAL
  Filled 2019-09-13 (×11): qty 15

## 2019-09-13 MED ORDER — HEPARIN SODIUM (PORCINE) 5000 UNIT/ML IJ SOLN
5000.0000 [IU] | Freq: Three times a day (TID) | INTRAMUSCULAR | Status: DC
  Administered 2019-09-13 – 2019-09-25 (×36): 5000 [IU] via SUBCUTANEOUS
  Filled 2019-09-13 (×34): qty 1

## 2019-09-13 MED ORDER — ORAL CARE MOUTH RINSE
15.0000 mL | Freq: Two times a day (BID) | OROMUCOSAL | Status: DC
  Administered 2019-09-13 – 2019-09-23 (×19): 15 mL via OROMUCOSAL

## 2019-09-13 NOTE — Consult Note (Signed)
NAME:  Charlotte [name], MRN:  [id number], DOB:  [birth year], LOS: 4 ADMISSION DATE:  09/01/2019, CONSULTATION DATE:  9/23 REFERRING MD:  Andres Labrum, CHIEF COMPLAINT:  Acute respiratory failure   Brief History   85 year old female admitted 9/19 w/ working dx of CAP/atypical PNA. Placed on azith and rocephin. COVID neg. Supported on supplemental oxgyen, given IV lasix and in spite of therapy continued to decline to point she required 100% FI02 on BIPAP w/ rapid desaturation to 70s when bipap seal broken. PCCM asked to see 9/23  History of present illness   This is a pleasant 85 year old female patient who was admitted on 9/19 with chief complaint of worsening shortness of breath, left lateral/lower back/chest discomfort, non-productive cough and possible fever. Denied HA, sore throat, sick exposure, loss of taste or smell. No sig swelling no orthopnea not waking up short of breath.  In ED CT w/ bilateral R>L multilobar GG airspace disease chronic contraction BTX left anterior chest and small L>R effusions. BNP was neg, inflammatory markers including CRP, LDH and ferritin all elevated. COVID negative.   Hospital course  9/19 Admitted w/ working dx of atypical PNA, placed on azith and rocephin and provided supplemental oxygen. Placed on respiratory isolation until repeat COVID returned 9/20 thru 9/23 progressive hypoxia. Repeat COVID neg. Diuresed 9/22 w/ concern that may be component of pulmonary edema. Later placed on BIPAP that evening when saturations could not be maintained above 90% even on heated high flow. PCCM asked to see the am 9/23 for progressive respiratory failure   Past Medical History  Breast cancer, involving the left breast status post lumpectomy, radiation and abbreviated regimen of Herceptin, discontinued due to CHF back in 2017.  Maintained on anastrozole since 2017.  Also has a history of normocytic anemia, diastolic heart dysfunction, pulmonary hypertension, and systolic dysfunction.   Significant Hospital Events   9/19 Admitted w/ working dx of atypical PNA, placed on azith and rocephin and provided supplemental oxygen. Placed on respiratory isolation until repeat COVID returned 9/20 thru 9/23 progressive hypoxia. Repeat COVID neg. Diuresed 9/22 w/ concern that may be component of pulmonary edema. Later placed on BIPAP that evening when saturations could not be maintained above 90% even on heated high flow. PCCM asked to see the am 9/23 for progressive respiratory failure   Consults:  Pulm   Procedures:    Significant Diagnostic Tests:   CT chest 9/19: 1. There is no evidence for pulmonary embolus. 2. There are areas of ground-glass opacification primarily within the right upper lobe, right middle lobe and bilateral lower lobes. These areas demonstrate interlobular septal thickening. These findings are nonspecific and may be related to pulmonary edema, acute interstitial pneumonia, drug toxicity, versus less likely diffuse alveolar hemorrhage. 3. Trace bilateral pleural effusions, left greater than right. 4. Redemonstration of scarring and traction bronchiectasis within the lingula presumably related to the patient's reported history of breast radiation.  ECHO 9/23>>>   Serologies 9/23: ANA with reflex:  ANA, IFA:  ANCA titer: Anti--DNA antibody, double-stranded: Anti-- J01 antibody, IgG Anti--scleredema antibody: Cyclic citrulline peptide antibody, IgG- IgA: MPO/PR-3 antibodies: Sed rate: Rheumatoid factor Sjogren's syndrome-a: Sjogren's syndrome-:B:    Micro Data:  COVID -19 9/20: neg  Urine strep antigen 9/23 Urine Legionella antigen 9/23 Respiratory viral panel on 9/23  Antimicrobials:  Rocephin 9/19: azithromycin 9/19   Interim history/subjective:  No distress on NIPPV  Objective   Blood pressure (Abnormal) 134/55, pulse 88, temperature 99.3 F (37.4 C), temperature source Oral,  resp. rate (Abnormal) 42, height 5\' 3"  (1.6 m), weight 61.2 kg,  SpO2 (Abnormal) 86 %.    FiO2 (%):  [100 %] 100 %   Intake/Output Summary (Last 24 hours) at 09/13/2019 0914 Last data filed at 09/12/2019 1019 Gross per 24 hour  Intake 240 ml  Output 350 ml  Net -110 ml   Filed Weights   08/29/2019 1625  Weight: 61.2 kg    Examination: General: Pleasant 85 year old black female resting comfortably on BiPAP, however she does desaturate very rapidly HENT: Mild JVD mucous membranes unassessable with BiPAP mask Lungs: Diffuse dry rales right greater than left Cardiovascular: Soft systolic murmur 3 out of 6 currently sinus rhythm Abdomen: Soft not tender Extremities: Trace lower extremity edema pulses are palpable and strong Neuro: Awake oriented no focal deficits GU: Due to void  Resolved Hospital Problem list     Assessment & Plan:   Acute hypoxic respiratory failure in the setting of bilateral multi lobar groundglass pulmonary infiltrates -Etiology is unclear differential diagnosis includes: Atypical infection, viral pneumonia, pulmonary edema, or also consider autoimmune related pneumonitis, there are case reports of people developing autoimmune processes on Arimidex long-term. -She is failed current therapy for possible community-acquired pneumonia Plan Transfer to intensive care Continue BiPAP, if she declines she would want short-term intubation however we would limit her length to somewhere between 7 and 10 days, she would not want tracheostomy or long-term ventilation DO NOT RESUSCITATE Sending autoimmune serologies Send urine strep antigen and urine Legionella antigen Repeat BNP & echocardiogram, last one was 2018 Continue Rocephin, complete 5 days of azithromycin this should be today IV Lasix Empirically initiating Solu-Medrol at 40 mg every 8 hours If she were to be intubated we should consider bronchoscopy for bronchial alveolar lavage  History of hypertension Plan Continuing calcium channel blocker as well as hydralazine  Mild  AKI -This is after IV Lasix, however only marginal bump.  BNP negative Plan Repeating a.m chemistry May need to discontinue further diuresis for now pending morning creatinine  Mild hyponatremia.  Currently stable.  Given recent diuresis urine studies likely to be not very helpful Plan Follow-up chemistry in a.m.  History of depression Plan Continue SSRI    Best practice:  Diet: N.p.o. except for medications Pain/Anxiety/Delirium protocol (if indicated): Not applicable VAP protocol (if indicated): Not applicable DVT prophylaxis: [place] heparin  GI prophylaxis: NA Glucose control: NA Mobility: BR Code Status: limited code.  She would be DO NOT RESUSCITATE should she suffer a cardiac or respiratory arrest. She would want short-term mechanical ventilation should her respiratory status decline Family Communication: Both her son and daughter were updated via telephone per her request on 9/23 Disposition:  Transferring to the intensive care.  Continuing broad-spectrum antibiotics, IV Lasix, adding Solu-Medrol.  Checking for autoimmune process, checking respiratory viral panel, echocardiogram.  Short-term ventilation would be acceptable to her, but would not want to be on ventilator long-term.  Hopefully we can avoid this. Labs   CBC: Recent Labs  Lab 09/06/2019 1638 09/10/19 0746 09/12/19 0600 09/13/19 0618  WBC 13.7* 14.4* 13.1* 13.8*  NEUTROABS 12.0*  --  11.6* 11.8*  HGB 11.1* 11.0* 9.8* 8.9*  HCT 34.2* 31.7* 29.6* 27.4*  MCV 88.1 85.9 85.5 87.3  PLT 618* 558* 562* 529*    Basic Metabolic Panel: Recent Labs  Lab 09/12/2019 1638 09/10/19 0746 09/12/19 0600 09/13/19 0618  NA 123* 131* 130* 130*  K 3.9 3.7 2.9* 4.2  CL 89* 95* 91* 93*  CO2 24  25 28 25   GLUCOSE 199* 147* 133* 130*  BUN 22 12 17  33*  CREATININE 0.90 0.62 0.87 1.07*  CALCIUM 9.2 8.8* 8.5* 8.7*   GFR: Estimated Creatinine Clearance: 29.5 mL/min (A) (by C-G formula based on SCr of 1.07 mg/dL (H)). Recent  Labs  Lab 09/18/2019 1626 09/14/2019 1638 09/14/2019 1856 09/10/19 0746 09/12/19 0600 09/13/19 0618  PROCALCITON  --  <0.10  --   --   --   --   WBC  --  13.7*  --  14.4* 13.1* 13.8*  LATICACIDVEN 1.7  --  1.2  --   --   --     Liver Function Tests: Recent Labs  Lab 08/29/2019 1638 09/10/19 0746  AST 23 18  ALT 18 16  ALKPHOS 83 75  BILITOT 0.1* 0.4  PROT 7.6 6.5  ALBUMIN 3.1* 2.4*   No results for input(s): LIPASE, AMYLASE in the last 168 hours. No results for input(s): AMMONIA in the last 168 hours.  ABG    Component Value Date/Time   PHART 7.401 09/13/2019 0905   PCO2ART 50.1 (H) 09/13/2019 0905   PO2ART 69.7 (L) 09/13/2019 0905   HCO3 30.5 (H) 09/13/2019 0905   O2SAT 93.1 09/13/2019 0905     Coagulation Profile: No results for input(s): INR, PROTIME in the last 168 hours.  Cardiac Enzymes: No results for input(s): CKTOTAL, CKMB, CKMBINDEX, TROPONINI in the last 168 hours.  HbA1C: Hgb A1c MFr Bld  Date/Time Value Ref Range Status  01/20/2018 09:17 AM 6.0 (H) <5.7 % of total Hgb Final    Comment:    For someone without known diabetes, a hemoglobin  A1c value between 5.7% and 6.4% is consistent with prediabetes and should be confirmed with a  follow-up test. . For someone with known diabetes, a value <7% indicates that their diabetes is well controlled. A1c targets should be individualized based on duration of diabetes, age, comorbid conditions, and other considerations. . This assay result is consistent with an increased risk of diabetes. . Currently, no consensus exists regarding use of hemoglobin A1c for diagnosis of diabetes for children. .     CBG: No results for input(s): GLUCAP in the last 168 hours.  Review of Systems:   Review of Systems  Constitutional: Positive for fever, malaise/fatigue and weight loss. Negative for chills.  HENT: Negative for congestion, sinus pain and sore throat.   Eyes: Negative.   Respiratory: Positive for cough  and shortness of breath.   Cardiovascular: Positive for chest pain. Negative for orthopnea and PND.  Gastrointestinal: Negative.   Genitourinary: Negative.   Musculoskeletal: Negative.      Past Medical History  She,  has a past medical history of Anxiety, Arthritis, Back pain, Cancer ([place]), CHF (congestive heart failure) ([place]), Depression, Fatigue, History of hyperthyroidism, Hypertension, Insomnia, OSA (obstructive sleep apnea), Personal history of radiation therapy (2017), and Pulmonary hypertension ([place]).   Surgical History    Past Surgical History:  Procedure Laterality Date  . BREAST LUMPECTOMY     right side  . BREAST LUMPECTOMY WITH AXILLARY LYMPH NODE BIOPSY Left 06/17/2016   Procedure: BREAST LUMPECTOMY WITH AXILLARY LYMPH NODE BIOPSY;  Surgeon: Autumn Messing III, MD;  Location: [facility];  Service: General;  Laterality: Left;  . COLONOSCOPY    . DILATION AND CURETTAGE OF UTERUS    . EYE SURGERY     bilateral cataracts removed  . FOOT SURGERY    . PORTACATH PLACEMENT Right 07/16/2016   Procedure: INSERTION PORT-A-CATH RIGHT  SUBLCLAVIAN;  Surgeon: Autumn Messing III, MD;  Location: [facility];  Service: General;  Laterality: Right;  . TONSILLECTOMY       Social History   reports that she is a non-smoker but has been exposed to tobacco smoke. She has never used smokeless tobacco. She reports that she does not drink alcohol or use drugs.   Family History   Her family history includes Cancer in her mother and sister; Heart attack in her mother; Heart disease in her maternal grandmother and mother; Hypertension in an other family member. There is no history of Lung disease or Rheumatologic disease.   Allergies Allergies  Allergen Reactions  . Latex Swelling    SEVERITY OF SWELLING NOT DEFINED.  Marland Kitchen Bextra [Valdecoxib] Other (See Comments)    Stomach Pains  . Motrin [Ibuprofen] Other (See Comments)    Stomach pain     Home Medications  Prior to Admission medications   Medication Sig  Start Date End Date Taking? Authorizing Provider  anastrozole (ARIMIDEX) 1 MG tablet Take 0.5 tablets (0.5 mg total) by mouth daily. 09/29/18  Yes Nicholas Lose, MD  BELSOMRA 5 MG TABS Take 1 tablet by mouth daily. 09/02/17  Yes [provider]  Calcium Carbonate-Vitamin D (CALCIUM 600+D3 PO) Take 1 tablet by mouth 2 (two) times daily.   Yes [provider]  carbamide peroxide (DEBROX) 6.5 % otic solution Place 5 drops into both ears daily as needed (ear wax buildup).    Yes [provider]  cycloSPORINE (RESTASIS) 0.05 % ophthalmic emulsion Place 1 drop into both eyes 2 (two) times daily.   Yes [provider]  felodipine (PLENDIL) 10 MG 24 hr tablet Take 1 tablet (10 mg total) by mouth daily. 10/06/17  Yes BranchAlphonse Guild, MD  fluticasone (FLONASE) 50 MCG/ACT nasal spray Place 2 sprays into the nose daily as needed for allergies.   Yes [provider]  hydrALAZINE (APRESOLINE) 25 MG tablet Take 1 tablet (25 mg total) by mouth 3 (three) times daily. 10/06/17 08/30/2019 Yes BranchAlphonse Guild, MD  losartan (COZAAR) 100 MG tablet Take 100 mg by mouth daily.   Yes [provider]  Omega-3 Fatty Acids (FISH OIL TRIPLE STRENGTH) 1400 MG CAPS Take 1 tablet by mouth daily.   Yes [provider]  Polyethyl Glycol-Propyl Glycol (SYSTANE) 0.4-0.3 % SOLN Apply 1 drop to eye 2 (two) times daily.   Yes [provider]  potassium chloride SA (K-DUR,KLOR-CON) 20 MEQ tablet TAKE (1) TABLET BY MOUTH DAILY. 10/28/18  Yes BranchAlphonse Guild, MD  sertraline (ZOLOFT) 50 MG tablet Take 50 mg by mouth daily.   Yes [provider]  spironolactone (ALDACTONE) 25 MG tablet TAKE 1/2 TABLET BY MOUTH ONCE DAILY. 02/09/19  Yes Arnoldo Lenis, MD  aspirin EC 81 MG tablet Take 81 mg by mouth daily.    [provider]     Critical care time: 44 minutes       Erick Colace ACNP-BC [place] Pager # [phone] OR  # [phone] if no answer

## 2019-09-13 NOTE — Progress Notes (Signed)
PROGRESS NOTE   Charlotte [name]  [id number]    DOB: [birth year]    DOA: 09/12/2019  PCP: Rosita Fire, MD   I have briefly reviewed patients previous medical records in [facility].  Chief Complaint  Patient presents with  . Shortness of Breath    Brief Narrative:  Female with PMH of OSA on CPAP, breast cancer s/p bilateral lumpectomy on Arimidex, HTN, pulmonary hypertension, depression who presented to [facility] ED on 9/19 due to dyspnea, mild cough, fevers and chronic postnasal drip.  Denied COVID-19 exposure.  In the ED COVID-19 testing was negative but inflammatory markers were elevated.  CT chest showed no evidence of PE, areas of groundglass opacification in the right upper lobe, right middle lobe and bilateral lower lobes.  Although COVID-19 testing was negative, index of suspicion for COVID-19 remained high and hence she was transferred to [facility].  Treated for possible community-acquired pneumonia/atypical pneumonia, placed on azithromycin and ceftriaxone, supplemental oxygen, diuresed with IV Lasix but continued to be dyspneic, worsening hypoxia and on BiPAP since 9/22 afternoon.  I consulted CCM on 9/23 morning and patient was transferred to ICU under their care.   Assessment & Plan:   Principal Problem:   Respiratory failure ([place]) Active Problems:   Community acquired pneumonia   Essential hypertension   Pneumonia   Acute diastolic CHF (congestive heart failure) (HCC)   Hypokalemia   1. Acute hypoxic respiratory failure: Etiology unclear.  Wide possible differential diagnosis.  This includes atypical bacterial pneumonia, viral pneumonia despite COVID-19 testing negative x2 but could have RSV pneumonia, pulmonary edema and CCM is considering autoimmune related pneumonitis from long-term use of Arimidex.  Failed usual measures as noted above.  I discussed with and consulted CCM this morning.  Patient transferred to ICU.  Continue BiPAP but if she worsens then considering short of  intubation.  CCM sending autoimmune serologies, urine pneumococcal and Legionella antigen, checking TTE.  Continue Rocephin and azithromycin.  IV Lasix ordered.  Empiric IV Solu-Medrol ordered as well. 2. Essential hypertension: Controlled 3. Anemia: Hemoglobin gradually drifting down in the absence of acute bleeding.  Follow CBCs closely. 4. Mild acute kidney injury: Possibly related to IV Lasix and poor oral intake.  Follow closely while on IV Lasix. 5. OSA on CPAP at home: Currently on BiPAP. 6. Hyponatremia: Stable. 7. Hypokalemia: Replaced. 8. History of breast cancer: On Arimidex. 9. Acute on chronic diastolic CHF: Diuretics discussion as above.  DVT prophylaxis: Lovenox Code Status: CODE STATUS changed by CCM to partial Family Communication: None at bedside Disposition: To be determined.  Patient critically ill and transferred to ICU under CCM care on 9/23.   Consultants:  CCM  Procedures:  BiPAP  Antimicrobials:  IV ceftriaxone and Zithromax   Subjective: Difficult to understand patient while on BiPAP.  Appears to have some struggle to breathe.  Has been on BiPAP since last night.  Does report dyspnea.  No pain.  Objective:  Vitals:   09/13/19 1552 09/13/19 1600 09/13/19 1700 09/13/19 1800  BP:  (!) 130/55 (!) 122/56 (!) 128/48  Pulse:  80 76 75  Resp:  (!) 27 (!) 25 (!) 25  Temp: 99.4 F (37.4 C)     TempSrc: Axillary     SpO2:  96% 95% 100%  Weight:      Height:        Examination:  General exam: Elderly female, moderately built and nourished sitting up in bed with mild tachypnea Respiratory system: Reduced breath sounds  bilaterally with scattered few crackles but no wheezing or rhonchi.  Mildly tachypneic. Cardiovascular system: S1 & S2 heard, RRR. No JVD, murmurs, rubs, gallops or clicks. No pedal edema.  Telemetry personally reviewed: Sinus rhythm.  Occasional nonsustained SVT. Gastrointestinal system: Abdomen is nondistended, soft and nontender. No  organomegaly or masses felt. Normal bowel sounds heard. Central nervous system: Alert and seems oriented. No focal neurological deficits. Extremities: Symmetric 5 x 5 power. Skin: No rashes, lesions or ulcers Psychiatry: Judgement and insight cannot be currently assessed. Mood & affect appropriate.     Data Reviewed: I have personally reviewed following labs and imaging studies  CBC: Recent Labs  Lab 09/15/2019 1638 09/10/19 0746 09/12/19 0600 09/13/19 0618  WBC 13.7* 14.4* 13.1* 13.8*  NEUTROABS 12.0*  --  11.6* 11.8*  HGB 11.1* 11.0* 9.8* 8.9*  HCT 34.2* 31.7* 29.6* 27.4*  MCV 88.1 85.9 85.5 87.3  PLT 618* 558* 562* 123XX123*   Basic Metabolic Panel: Recent Labs  Lab 08/30/2019 1638 09/10/19 0746 09/12/19 0600 09/13/19 0618  NA 123* 131* 130* 130*  K 3.9 3.7 2.9* 4.2  CL 89* 95* 91* 93*  CO2 24 25 28 25   GLUCOSE 199* 147* 133* 130*  BUN 22 12 17  33*  CREATININE 0.90 0.62 0.87 1.07*  CALCIUM 9.2 8.8* 8.5* 8.7*   Liver Function Tests: Recent Labs  Lab 09/02/2019 1638 09/10/19 0746  AST 23 18  ALT 18 16  ALKPHOS 83 75  BILITOT 0.1* 0.4  PROT 7.6 6.5  ALBUMIN 3.1* 2.4*    Cardiac Enzymes: No results for input(s): CKTOTAL, CKMB, CKMBINDEX, TROPONINI in the last 168 hours.  CBG: Recent Labs  Lab 09/13/19 1208  GLUCAP 146*    Recent Results (from the past 240 hour(s))  Urine culture     Status: None   Collection Time: 09/20/2019  4:36 PM   Specimen: In/Out Cath Urine  Result Value Ref Range Status   Specimen Description   Final    IN/OUT CATH URINE Performed at [facility], [address] [place], [place] [address]    Special Requests   Final    NONE Performed at [facility], [address], [place], [place] [address]    Culture   Final    NO GROWTH Performed at [facility], [place] [address], [place], [place] [address]    Report Status 09/11/2019 FINAL  Final  Blood Culture (routine x 2)     Status: None (Preliminary result)   Collection Time:  09/08/2019  4:38 PM   Specimen: BLOOD RIGHT FOREARM  Result Value Ref Range Status   Specimen Description   Final    BLOOD RIGHT FOREARM BOTTLES DRAWN AEROBIC AND ANAEROBIC   Special Requests Blood Culture adequate volume  Final   Culture   Final    NO GROWTH 4 DAYS Performed at [facility], [address], [place], [place] [address]    Report Status PENDING  Incomplete  Blood Culture (routine x 2)     Status: None (Preliminary result)   Collection Time: 09/02/2019  4:40 PM   Specimen: Left Antecubital; Blood  Result Value Ref Range Status   Specimen Description   Final    LEFT ANTECUBITAL BOTTLES DRAWN AEROBIC AND ANAEROBIC   Special Requests Blood Culture adequate volume  Final   Culture   Final    NO GROWTH 4 DAYS Performed at [facility], [address] [place], [place] [address]    Report Status PENDING  Incomplete  SARS Coronavirus 2 [facility] order, Performed in [facility] hospital lab) Nasopharyngeal Nasopharyngeal Swab     Status: None   Collection Time: 09/07/2019  4:55 PM   Specimen: Nasopharyngeal Swab  Result Value Ref Range Status   SARS Coronavirus 2 NEGATIVE NEGATIVE Final    Comment: (NOTE) If result is NEGATIVE SARS-CoV-2 target nucleic acids are NOT DETECTED. The SARS-CoV-2 RNA is generally detectable in upper and lower  respiratory specimens during the acute phase of infection. The lowest  concentration of SARS-CoV-2 viral copies this assay can detect is 250  copies / mL. A negative result does not preclude SARS-CoV-2 infection  and should not be used as the sole basis for treatment or other  patient management decisions.  A negative result may occur with  improper specimen collection / handling, submission of specimen other  than nasopharyngeal swab, presence of viral mutation(s) within the  areas targeted by this assay, and inadequate number of viral copies  (<250 copies / mL). A negative result must be combined with clinical  observations, patient history,  and epidemiological information. If result is POSITIVE SARS-CoV-2 target nucleic acids are DETECTED. The SARS-CoV-2 RNA is generally detectable in upper and lower  respiratory specimens dur ing the acute phase of infection.  Positive  results are indicative of active infection with SARS-CoV-2.  Clinical  correlation with patient history and other diagnostic information is  necessary to determine patient infection status.  Positive results do  not rule out bacterial infection or co-infection with other viruses. If result is PRESUMPTIVE POSTIVE SARS-CoV-2 nucleic acids MAY BE PRESENT.   A presumptive positive result was obtained on the submitted specimen  and confirmed on repeat testing.  While 2019 novel coronavirus  (SARS-CoV-2) nucleic acids may be present in the submitted sample  additional confirmatory testing may be necessary for epidemiological  and / or clinical management purposes  to differentiate between  SARS-CoV-2 and other Sarbecovirus currently known to infect humans.  If clinically indicated additional testing with an alternate test  methodology [phone]) is advised. The SARS-CoV-2 RNA is generally  detectable in upper and lower respiratory sp ecimens during the acute  phase of infection. The expected result is Negative. Fact Sheet for Patients:  StrictlyIdeas.no Fact Sheet for Healthcare Providers: BankingDealers.co.za This test is not yet approved or cleared by the [place] FDA and has been authorized for detection and/or diagnosis of SARS-CoV-2 by FDA under an Emergency Use Authorization (EUA).  This EUA will remain in effect (meaning this test can be used) for the duration of the COVID-19 declaration under Section 564(b)(1) of the Act, 21 U.S.C. section 360bbb-3(b)(1), unless the authorization is terminated or revoked sooner. Performed at [facility], [address], [place], [place] [address]   SARS CORONAVIRUS  2 (TAT 6-24 HRS) Nasopharyngeal Nasopharyngeal Swab     Status: None   Collection Time: 09/10/19  5:11 PM   Specimen: Nasopharyngeal Swab  Result Value Ref Range Status   SARS Coronavirus 2 NEGATIVE NEGATIVE Final    Comment: (NOTE) SARS-CoV-2 target nucleic acids are NOT DETECTED. The SARS-CoV-2 RNA is generally detectable in upper and lower respiratory specimens during the acute phase of infection. Negative results do not preclude SARS-CoV-2 infection, do not rule out co-infections with other pathogens, and should not be used as the sole basis for treatment or other patient management decisions. Negative results must be combined with clinical observations, patient history, and epidemiological information. The expected result is Negative. Fact Sheet for Patients: SugarRoll.be Fact Sheet  for Healthcare Providers: https://www.woods-mathews.com/ This test is not yet approved or cleared by the [place] and  has been authorized for detection and/or diagnosis of SARS-CoV-2 by FDA under an Emergency Use Authorization (EUA). This EUA will remain  in effect (meaning this test can be used) for the duration of the COVID-19 declaration under Section 56 4(b)(1) of the Act, 21 U.S.C. section 360bbb-3(b)(1), unless the authorization is terminated or revoked sooner. Performed at [facility], [place] [address] [place], [place] [address]   MRSA PCR Screening     Status: None   Collection Time: 09/13/19 11:59 AM   Specimen: Nasal Mucosa; Nasopharyngeal  Result Value Ref Range Status   MRSA by PCR NEGATIVE NEGATIVE Final    Comment:        The GeneXpert MRSA Assay (FDA approved for NASAL specimens only), is one component of a comprehensive MRSA colonization surveillance program. It is not intended to diagnose MRSA infection nor to guide or monitor treatment for MRSA infections. Performed at [facility], [place] [address]  [place], [place] [address]          Radiology Studies: Dg Chest 1 View  Result Date: 09/12/2019 CLINICAL DATA:  Pt c/o SOB, respiratory failure Hx CHF, HTN EXAM: CHEST  1 VIEW COMPARISON:  08/25/2019 FINDINGS: Diffuse bilateral alveolar and interstitial opacities appear marginally increased since prior study, particularly in the left lung base. Heart size upper limits normal for technique. Aortic Atherosclerosis (ICD10-170.0). No effusion. No pneumothorax. Visualized bones unremarkable. IMPRESSION: 1. Worsening bilateral infiltrates or edema. Electronically Signed   By: Lucrezia Europe M.D.   On: 09/12/2019 20:07   Dg Chest Port 1 View  Result Date: 09/13/2019 CLINICAL DATA:  Dyspnea. EXAM: PORTABLE CHEST 1 VIEW COMPARISON:  Radiograph of September 12, 2019. FINDINGS: Stable cardiomegaly. Atherosclerosis of thoracic aorta is noted. No pneumothorax or pleural effusion is noted. Stable bilateral lung opacities are noted concerning for edema or pneumonia. Bony thorax is unremarkable. IMPRESSION: Stable bilateral lung opacities as described above. Aortic Atherosclerosis (ICD10-I70.0). Electronically Signed   By: Marijo Conception M.D.   On: 09/13/2019 09:55        Scheduled Meds: . anastrozole  0.5 mg Oral Daily  . aspirin EC  81 mg Oral Daily  . chlorhexidine  15 mL Mouth Rinse BID  . Chlorhexidine Gluconate Cloth  6 each Topical Daily  . cholecalciferol  1,000 Units Oral Daily  . cycloSPORINE  1 drop Both Eyes BID  . felodipine  10 mg Oral Daily  . heparin injection (subcutaneous)  5,000 Units Subcutaneous Q8H  . influenza vaccine adjuvanted  0.5 mL Intramuscular Tomorrow-1000  . ipratropium-albuterol  3 mL Nebulization Q6H  . mouth rinse  15 mL Mouth Rinse q12n4p  . methylPREDNISolone (SOLU-MEDROL) injection  40 mg Intravenous Q8H  . polyvinyl alcohol  1 drop Both Eyes BID  . sertraline  50 mg Oral Daily  . spironolactone  12.5 mg Oral Daily  . vitamin C  1,000 mg Oral Daily  . zinc sulfate   220 mg Oral Daily   Continuous Infusions: . sodium chloride 10 mL/hr at 09/10/19 1934  . azithromycin 500 mg (09/13/19 1818)  . cefTRIAXone (ROCEPHIN)  IV 2 g (09/13/19 1712)     LOS: 4 days     Vernell Leep, MD, FACP, [facility]. [facility]  To contact the attending provider between 7A-7P or the covering provider during after hours 7P-7A, please log into the web site [email or web address] and  access using universal [place] password for that web site. If you do not have the password, please call the hospital operator.  09/13/2019, 7:04 PM

## 2019-09-13 NOTE — Procedures (Signed)
Echo attempted while patient was on 2W. Nurse asked that I completed once transfer to 3M06 was complete. Will attempt again later.

## 2019-09-13 NOTE — Progress Notes (Signed)
  Echocardiogram 2D Echocardiogram with Definity has been performed.  Charlotte [name] 09/13/2019, 3:32 PM

## 2019-09-14 ENCOUNTER — Inpatient Hospital Stay (HOSPITAL_COMMUNITY): Payer: Medicare Other

## 2019-09-14 LAB — BASIC METABOLIC PANEL
Anion gap: 13 (ref 5–15)
Anion gap: 13 (ref 5–15)
BUN: 31 mg/dL — ABNORMAL HIGH (ref 8–23)
BUN: 30 mg/dL — ABNORMAL HIGH (ref 8–23)
CO2: 30 mmol/L (ref 22–32)
CO2: 30 mmol/L (ref 22–32)
Calcium: 9.4 mg/dL (ref 8.9–10.3)
Calcium: 9.3 mg/dL (ref 8.9–10.3)
Chloride: 94 mmol/L — ABNORMAL LOW (ref 98–111)
Chloride: 95 mmol/L — ABNORMAL LOW (ref 98–111)
Creatinine, Ser: 0.81 mg/dL (ref 0.44–1.00)
Creatinine, Ser: 0.69 mg/dL (ref 0.44–1.00)
GFR calc Af Amer: >60 mL/min (ref >60)
GFR calc Af Amer: >60 mL/min (ref >60)
GFR calc non Af Amer: >60 mL/min (ref >60)
GFR calc non Af Amer: >60 mL/min (ref >60)
Glucose, Bld: 165 mg/dL — ABNORMAL HIGH (ref 70–99)
Glucose, Bld: 173 mg/dL — ABNORMAL HIGH (ref 70–99)
Potassium: 4 mmol/L (ref 3.5–5.1)
Potassium: 4 mmol/L (ref 3.5–5.1)
Sodium: 137 mmol/L (ref 135–145)
Sodium: 138 mmol/L (ref 135–145)

## 2019-09-14 LAB — RESPIRATORY PANEL BY PCR

## 2019-09-14 LAB — CBC
HCT: 30 % — ABNORMAL LOW (ref 36.0–46.0)
HCT: 31.2 % — ABNORMAL LOW (ref 36.0–46.0)
Hemoglobin: 9.6 g/dL — ABNORMAL LOW (ref 12.0–15.0)
Hemoglobin: 10.2 g/dL — ABNORMAL LOW (ref 12.0–15.0)
MCH: 28.2 pg (ref 26.0–34.0)
MCH: 28.7 pg (ref 26.0–34.0)
MCHC: 32 g/dL (ref 30.0–36.0)
MCHC: 32.7 g/dL (ref 30.0–36.0)
MCV: 88.2 fL (ref 80.0–100.0)
MCV: 87.6 fL (ref 80.0–100.0)
Platelets: 609 10*3/uL — ABNORMAL HIGH (ref 150–400)
Platelets: 576 10*3/uL — ABNORMAL HIGH (ref 150–400)
RBC: 3.4 MIL/uL — ABNORMAL LOW (ref 3.87–5.11)
RBC: 3.56 MIL/uL — ABNORMAL LOW (ref 3.87–5.11)
RDW: 14.3 % (ref 11.5–15.5)
RDW: 14.4 % (ref 11.5–15.5)
WBC: 13.2 10*3/uL — ABNORMAL HIGH (ref 4.0–10.5)
WBC: 13.3 10*3/uL — ABNORMAL HIGH (ref 4.0–10.5)
nRBC: 0 % (ref 0.0–0.2)
nRBC: 0 % (ref 0.0–0.2)

## 2019-09-14 LAB — SJOGRENS SYNDROME-B EXTRACTABLE NUCLEAR ANTIBODY: SSB (La) (ENA) Antibody, IgG: <0.2 AI (ref 0.0–0.9)

## 2019-09-14 LAB — ANCA TITERS
Atypical P-ANCA titer: <1:20 {titer}
C-ANCA: <1:20 {titer}
P-ANCA: <1:20 {titer}

## 2019-09-14 LAB — ANTI-SCLERODERMA ANTIBODY: Scleroderma (Scl-70) (ENA) Antibody, IgG: <0.2 AI (ref 0.0–0.9)

## 2019-09-14 LAB — CULTURE, BLOOD (ROUTINE X 2)
Culture: NO GROWTH
Culture: NO GROWTH
Special Requests: ADEQUATE
Special Requests: ADEQUATE

## 2019-09-14 LAB — RHEUMATOID FACTOR: Rheumatoid fact SerPl-aCnc: 30 IU/mL — ABNORMAL HIGH (ref 0.0–13.9)

## 2019-09-14 LAB — ANTI-DNA ANTIBODY, DOUBLE-STRANDED: ds DNA Ab: <1 IU/mL (ref 0–9)

## 2019-09-14 LAB — LEGIONELLA PNEUMOPHILA SEROGP 1 UR AG: L. pneumophila Serogp 1 Ur Ag: NEGATIVE

## 2019-09-14 LAB — ANTI-JO 1 ANTIBODY, IGG: Anti JO-1: <0.2 AI (ref 0.0–0.9)

## 2019-09-14 LAB — ANA W/REFLEX IF POSITIVE: Anti Nuclear Antibody (ANA): NEGATIVE

## 2019-09-14 LAB — SJOGRENS SYNDROME-A EXTRACTABLE NUCLEAR ANTIBODY: SSA (Ro) (ENA) Antibody, IgG: <0.2 AI (ref 0.0–0.9)

## 2019-09-14 LAB — ANTINUCLEAR ANTIBODIES, IFA: ANA Ab, IFA: NEGATIVE

## 2019-09-14 MED ORDER — FUROSEMIDE 10 MG/ML IJ SOLN
40.0000 mg | Freq: Once | INTRAMUSCULAR | Status: AC
  Administered 2019-09-14: 40 mg via INTRAVENOUS
  Filled 2019-09-14: qty 4

## 2019-09-14 MED FILL — Perflutren Lipid Microsphere IV Susp 1.1 MG/ML: INTRAVENOUS | Qty: 10 | Status: AC

## 2019-09-14 NOTE — Progress Notes (Signed)
NAME:  Charlotte [name], MRN:  [id number], DOB:  11/22/1928, LOS: 5 ADMISSION DATE:  09/01/2019, CONSULTATION DATE:  9/23 REFERRING MD:  Andres Labrum, CHIEF COMPLAINT:  Acute respiratory failure   Brief History   85 year old female admitted 9/19 w/ working dx of CAP/atypical PNA. Placed on azith and rocephin. COVID neg. Supported on supplemental oxgyen, given IV lasix and in spite of therapy continued to decline to point she required 100% FI02 on BIPAP w/ rapid desaturation to 70s when bipap seal broken. PCCM asked to see 9/23  History of present illness   This is a pleasant 85 year old female patient who was admitted on 9/19 with chief complaint of worsening shortness of breath, left lateral/lower back/chest discomfort, non-productive cough and possible fever. Denied HA, sore throat, sick exposure, loss of taste or smell. No sig swelling no orthopnea not waking up short of breath.  In ED CT w/ bilateral R>L multilobar GG airspace disease chronic contraction BTX left anterior chest and small L>R effusions. BNP was neg, inflammatory markers including CRP, LDH and ferritin all elevated. COVID negative.   Hospital course  9/19 Admitted w/ working dx of atypical PNA, placed on azith and rocephin and provided supplemental oxygen. Placed on respiratory isolation until repeat COVID returned 9/20 thru 9/23 progressive hypoxia. Repeat COVID neg. Diuresed 9/22 w/ concern that may be component of pulmonary edema. Later placed on BIPAP that evening when saturations could not be maintained above 90% even on heated high flow. PCCM asked to see the am 9/23 for progressive respiratory failure   Significant Hospital Events   9/19 Admitted w/ working dx of atypical PNA, placed on azith and rocephin and provided supplemental oxygen. Placed on respiratory isolation until repeat COVID returned 9/20 thru 9/23 progressive hypoxia. Repeat COVID neg. Diuresed 9/22 w/ concern that may be component of pulmonary edema. Later placed  on BIPAP that evening when saturations could not be maintained above 90% even on heated high flow. PCCM asked to see the am 9/23 for progressive respiratory failure   Consults:  9/24 CCM  Procedures:    Significant Diagnostic Tests:   CT chest 9/19: 1. There is no evidence for pulmonary embolus. 2. There are areas of ground-glass opacification primarily within the right upper lobe, right middle lobe and bilateral lower lobes. These areas demonstrate interlobular septal thickening. These findings are nonspecific and may be related to pulmonary edema, acute interstitial pneumonia, drug toxicity, versus less likely diffuse alveolar hemorrhage. 3. Trace bilateral pleural effusions, left greater than right. 4. Redemonstration of scarring and traction bronchiectasis within the lingula presumably related to the patient's reported history of breast radiation.  ECHO 9/23: LVEF [id number], some diastolic dysfunction, RVSP 59.7  Serologies 9/23: ANA with reflex:  ANA, IFA:  ANCA titer: Anti--DNA antibody, double-stranded: Anti-- J01 antibody, IgG Anti--scleredema antibody: Cyclic citrulline peptide antibody, IgG- IgA: MPO/PR-3 antibodies: Sed rate: + 112 Rheumatoid factor: +30 Sjogren's syndrome-a: Sjogren's syndrome-:B:    Micro Data:  COVID -19 9/20: neg  Urine strep antigen 9/23: negative Urine Legionella antigen 9/23: pending Respiratory viral panel on 9/23: negative  Antimicrobials:  Rocephin 9/19:-> azithromycin 9/19->9/24   Interim history/subjective:  9/24: awake and remains on NIV, escalated settings. Pt reports breathing is stable.   Objective   Blood pressure (!) 145/64, pulse 78, temperature 98.5 F (36.9 C), temperature source Axillary, resp. rate (!) 28, height 5\' 3"  (1.6 m), weight 61.2 kg, SpO2 98 %.    FiO2 (%):  [90 %-100 %] 100 %  Intake/Output Summary (Last 24 hours) at 09/14/2019 0753 Last data filed at 09/14/2019 0600 Gross per 24 hour  Intake 1270.65 ml   Output 550 ml  Net 720.65 ml   Filed Weights   08/29/2019 1625  Weight: 61.2 kg    Examination: General: Pleasant 85 year old black female resting comfortably on BiPAP, however she does desaturate very rapidly when off HENT: Mild JVD mucous membranes unassessable with BiPAP mask Lungs: Diffuse dry rales, more diminished bs on L Cardiovascular: Soft systolic murmur 3 out of 6 currently sinus rhythm Abdomen: Soft not tender Extremities: no lower extremity edema pulses are palpable and strong Neuro: Awake oriented no focal deficits   Resolved Hospital Problem list     Assessment & Plan:   Acute hypoxic respiratory failure in the setting of bilateral multi lobar groundglass pulmonary infiltrates -Etiology is unclear differential diagnosis includes: Atypical infection, viral pneumonia (nega RVP), pulmonary edema, or also consider autoimmune related pneumonitis, there are case reports of people developing autoimmune processes on Arimidex long-term. -She is failed current therapy for possible community-acquired pneumonia Plan -Continue BiPAP, if she declines she would want short-term intubation however we would limit her length to somewhere between 7 and 10 days, she would not want tracheostomy or long-term ventilation -DO NOT RESUSCITATE -Pending autoimmune serologies (RF positive at 30 but otherwise still pending) -Negative urine strep antigen and urine Legionella antigen - BNP low & echocardiogram with RVSP in 50's but normal LVEF and some diastolic dysfunction -Continue Rocephin -completed 5 days azithro -IV Lasix prn -cont empiric Solu-Medrol at 40 mg every 8 hours -If she were to be intubated we should consider bronchoscopy for bronchial alveolar lavage -ever so slight improvement on CXR today  History of hypertension Plan Continuing calcium channel blocker as well as hydralazine  Mild AKI -improved Plan -uop only 550 over 24 hours -Gingerly diuresising  Mild  hyponatremia.   resolved  History of depression Plan Continue SSRI    Best practice:  Diet: N.p.o. except for medications Pain/Anxiety/Delirium protocol (if indicated): Not applicable VAP protocol (if indicated): Not applicable DVT prophylaxis: [place] heparin  GI prophylaxis: NA Glucose control: NA Mobility: BR Code Status: limited code.  She would be DO NOT RESUSCITATE should she suffer a cardiac or respiratory arrest. She would want short-term mechanical ventilation should her respiratory status decline Family Communication: Both her son and daughter were updated via telephone per her request on 9/23 Disposition:  ICU Labs   CBC: Recent Labs  Lab 09/07/2019 1638 09/10/19 0746 09/12/19 0600 09/13/19 0618 09/14/19 0405  WBC 13.7* 14.4* 13.1* 13.8* 13.2*  NEUTROABS 12.0*  --  11.6* 11.8*  --   HGB 11.1* 11.0* 9.8* 8.9* 9.6*  HCT 34.2* 31.7* 29.6* 27.4* 30.0*  MCV 88.1 85.9 85.5 87.3 88.2  PLT 618* 558* 562* 529* 609*    Basic Metabolic Panel: Recent Labs  Lab 08/28/2019 1638 09/10/19 0746 09/12/19 0600 09/13/19 0618 09/14/19 0405  NA 123* 131* 130* 130* 137  K 3.9 3.7 2.9* 4.2 4.0  CL 89* 95* 91* 93* 94*  CO2 24 25 28 25 30   GLUCOSE 199* 147* 133* 130* 165*  BUN 22 12 17  33* 31*  CREATININE 0.90 0.62 0.87 1.07* 0.81  CALCIUM 9.2 8.8* 8.5* 8.7* 9.4   GFR: Estimated Creatinine Clearance: 38.9 mL/min (by C-G formula based on SCr of 0.81 mg/dL). Recent Labs  Lab 08/30/2019 1626  09/02/2019 1638 09/08/2019 1856 09/10/19 0746 09/12/19 0600 09/13/19 0618 09/14/19 0405  PROCALCITON  --   --  <  0.10  --   --   --   --   --   WBC  --    < > 13.7*  --  14.4* 13.1* 13.8* 13.2*  LATICACIDVEN 1.7  --   --  1.2  --   --   --   --    < > = values in this interval not displayed.    Liver Function Tests: Recent Labs  Lab 09/04/2019 1638 09/10/19 0746  AST 23 18  ALT 18 16  ALKPHOS 83 75  BILITOT 0.1* 0.4  PROT 7.6 6.5  ALBUMIN 3.1* 2.4*   No results for input(s):  LIPASE, AMYLASE in the last 168 hours. No results for input(s): AMMONIA in the last 168 hours.  ABG    Component Value Date/Time   PHART 7.401 09/13/2019 0905   PCO2ART 50.1 (H) 09/13/2019 0905   PO2ART 69.7 (L) 09/13/2019 0905   HCO3 30.5 (H) 09/13/2019 0905   O2SAT 93.1 09/13/2019 0905     Coagulation Profile: No results for input(s): INR, PROTIME in the last 168 hours.  Cardiac Enzymes: No results for input(s): CKTOTAL, CKMB, CKMBINDEX, TROPONINI in the last 168 hours.  HbA1C: Hgb A1c MFr Bld  Date/Time Value Ref Range Status  01/20/2018 09:17 AM 6.0 (H) <5.7 % of total Hgb Final    Comment:    For someone without known diabetes, a hemoglobin  A1c value between 5.7% and 6.4% is consistent with prediabetes and should be confirmed with a  follow-up test. . For someone with known diabetes, a value <7% indicates that their diabetes is well controlled. A1c targets should be individualized based on duration of diabetes, age, comorbid conditions, and other considerations. . This assay result is consistent with an increased risk of diabetes. . Currently, no consensus exists regarding use of hemoglobin A1c for diagnosis of diabetes for children. .     CBG: Recent Labs  Lab 09/13/19 1208  GLUCAP 146*    Critical care time: The patient is critically ill with multiple organ systems failure and requires high complexity decision making for assessment and support, frequent evaluation and titration of therapies, application of advanced monitoring technologies and extensive interpretation of multiple databases.  Critical care time 39 mins. This represents my time independent of the NPs time taking care of the pt. This is excluding procedures.    Audria Nine DO Pager: [phone] After hours pager: [phone]  [facility] 09/14/2019, 7:53 AM

## 2019-09-14 NOTE — Consult Note (Deleted)
NAME:  Charlotte [name], MRN:  [id number], DOB:  09/22/1928, LOS: 5 ADMISSION DATE:  08/26/2019, CONSULTATION DATE:  9/23 REFERRING MD:  Andres Labrum, CHIEF COMPLAINT:  Acute respiratory failure   Brief History   85 year old female admitted 9/19 w/ working dx of CAP/atypical PNA. Placed on azith and rocephin. COVID neg. Supported on supplemental oxgyen, given IV lasix and in spite of therapy continued to decline to point she required 100% FI02 on BIPAP w/ rapid desaturation to 70s when bipap seal broken. PCCM asked to see 9/23  History of present illness   This is a pleasant 85 year old female patient who was admitted on 9/19 with chief complaint of worsening shortness of breath, left lateral/lower back/chest discomfort, non-productive cough and possible fever. Denied HA, sore throat, sick exposure, loss of taste or smell. No sig swelling no orthopnea not waking up short of breath.  In ED CT w/ bilateral R>L multilobar GG airspace disease chronic contraction BTX left anterior chest and small L>R effusions. BNP was neg, inflammatory markers including CRP, LDH and ferritin all elevated. COVID negative.   Hospital course  9/19 Admitted w/ working dx of atypical PNA, placed on azith and rocephin and provided supplemental oxygen. Placed on respiratory isolation until repeat COVID returned 9/20 thru 9/23 progressive hypoxia. Repeat COVID neg. Diuresed 9/22 w/ concern that may be component of pulmonary edema. Later placed on BIPAP that evening when saturations could not be maintained above 90% even on heated high flow. PCCM asked to see the am 9/23 for progressive respiratory failure   Significant Hospital Events   9/19 Admitted w/ working dx of atypical PNA, placed on azith and rocephin and provided supplemental oxygen. Placed on respiratory isolation until repeat COVID returned 9/20 thru 9/23 progressive hypoxia. Repeat COVID neg. Diuresed 9/22 w/ concern that may be component of pulmonary edema. Later placed  on BIPAP that evening when saturations could not be maintained above 90% even on heated high flow. PCCM asked to see the am 9/23 for progressive respiratory failure   Consults:  9/24 CCM  Procedures:    Significant Diagnostic Tests:   CT chest 9/19: 1. There is no evidence for pulmonary embolus. 2. There are areas of ground-glass opacification primarily within the right upper lobe, right middle lobe and bilateral lower lobes. These areas demonstrate interlobular septal thickening. These findings are nonspecific and may be related to pulmonary edema, acute interstitial pneumonia, drug toxicity, versus less likely diffuse alveolar hemorrhage. 3. Trace bilateral pleural effusions, left greater than right. 4. Redemonstration of scarring and traction bronchiectasis within the lingula presumably related to the patient's reported history of breast radiation.  ECHO 9/23: LVEF 123456, some diastolic dysfunction, RVSP 59.7  Serologies 9/23: ANA with reflex:  ANA, IFA:  ANCA titer: Anti--DNA antibody, double-stranded: Anti-- J01 antibody, IgG Anti--scleredema antibody: Cyclic citrulline peptide antibody, IgG- IgA: MPO/PR-3 antibodies: Sed rate: + 112 Rheumatoid factor: +30 Sjogren's syndrome-a: Sjogren's syndrome-:B:    Micro Data:  COVID -19 9/20: neg  Urine strep antigen 9/23: negative Urine Legionella antigen 9/23: pending Respiratory viral panel on 9/23: negative  Antimicrobials:  Rocephin 9/19:-> azithromycin 9/19->9/24   Interim history/subjective:  9/24: awake and remains on NIV, escalated settings. Pt reports breathing is stable.   Objective   Blood pressure (!) 145/64, pulse 78, temperature 98.5 F (36.9 C), temperature source Axillary, resp. rate (!) 28, height 5\' 3"  (1.6 m), weight 61.2 kg, SpO2 98 %.    FiO2 (%):  [90 %-100 %] 100 %  Intake/Output Summary (Last 24 hours) at 09/14/2019 0753 Last data filed at 09/14/2019 0600 Gross per 24 hour  Intake 1270.65 ml   Output 550 ml  Net 720.65 ml   Filed Weights   08/23/2019 1625  Weight: 61.2 kg    Examination: General: Pleasant 85 year old black female resting comfortably on BiPAP, however she does desaturate very rapidly when off HENT: Mild JVD mucous membranes unassessable with BiPAP mask Lungs: Diffuse dry rales, more diminished bs on L Cardiovascular: Soft systolic murmur 3 out of 6 currently sinus rhythm Abdomen: Soft not tender Extremities: no lower extremity edema pulses are palpable and strong Neuro: Awake oriented no focal deficits   Resolved Hospital Problem list     Assessment & Plan:   Acute hypoxic respiratory failure in the setting of bilateral multi lobar groundglass pulmonary infiltrates -Etiology is unclear differential diagnosis includes: Atypical infection, viral pneumonia (nega RVP), pulmonary edema, or also consider autoimmune related pneumonitis, there are case reports of people developing autoimmune processes on Arimidex long-term. -She is failed current therapy for possible community-acquired pneumonia Plan -Continue BiPAP, if she declines she would want short-term intubation however we would limit her length to somewhere between 7 and 10 days, she would not want tracheostomy or long-term ventilation -DO NOT RESUSCITATE -Pending autoimmune serologies (RF positive at 30 but otherwise still pending) -Negative urine strep antigen and urine Legionella antigen - BNP low & echocardiogram with RVSP in 50's but normal LVEF and some diastolic dysfunction -Continue Rocephin -completed 5 days azithro -IV Lasix prn -cont empiric Solu-Medrol at 40 mg every 8 hours -If she were to be intubated we should consider bronchoscopy for bronchial alveolar lavage -ever so slight improvement on CXR today  History of hypertension Plan Continuing calcium channel blocker as well as hydralazine  Mild AKI -improved Plan -uop only 550 over 24 hours -Gingerly diuresising  Mild  hyponatremia.   resolved  History of depression Plan Continue SSRI    Best practice:  Diet: N.p.o. except for medications Pain/Anxiety/Delirium protocol (if indicated): Not applicable VAP protocol (if indicated): Not applicable DVT prophylaxis: [place] heparin  GI prophylaxis: NA Glucose control: NA Mobility: BR Code Status: limited code.  She would be DO NOT RESUSCITATE should she suffer a cardiac or respiratory arrest. She would want short-term mechanical ventilation should her respiratory status decline Family Communication: Both her son and daughter were updated via telephone per her request on 9/23 Disposition:  ICU Labs   CBC: Recent Labs  Lab 09/08/2019 1638 09/10/19 0746 09/12/19 0600 09/13/19 0618 09/14/19 0405  WBC 13.7* 14.4* 13.1* 13.8* 13.2*  NEUTROABS 12.0*  --  11.6* 11.8*  --   HGB 11.1* 11.0* 9.8* 8.9* 9.6*  HCT 34.2* 31.7* 29.6* 27.4* 30.0*  MCV 88.1 85.9 85.5 87.3 88.2  PLT 618* 558* 562* 529* 609*    Basic Metabolic Panel: Recent Labs  Lab 09/17/2019 1638 09/10/19 0746 09/12/19 0600 09/13/19 0618 09/14/19 0405  NA 123* 131* 130* 130* 137  K 3.9 3.7 2.9* 4.2 4.0  CL 89* 95* 91* 93* 94*  CO2 24 25 28 25 30   GLUCOSE 199* 147* 133* 130* 165*  BUN 22 12 17  33* 31*  CREATININE 0.90 0.62 0.87 1.07* 0.81  CALCIUM 9.2 8.8* 8.5* 8.7* 9.4   GFR: Estimated Creatinine Clearance: 38.9 mL/min (by C-G formula based on SCr of 0.81 mg/dL). Recent Labs  Lab 08/23/2019 1626  08/25/2019 1638 09/17/2019 1856 09/10/19 0746 09/12/19 0600 09/13/19 0618 09/14/19 0405  PROCALCITON  --   --  <  0.10  --   --   --   --   --   WBC  --    < > 13.7*  --  14.4* 13.1* 13.8* 13.2*  LATICACIDVEN 1.7  --   --  1.2  --   --   --   --    < > = values in this interval not displayed.    Liver Function Tests: Recent Labs  Lab 08/25/2019 1638 09/10/19 0746  AST 23 18  ALT 18 16  ALKPHOS 83 75  BILITOT 0.1* 0.4  PROT 7.6 6.5  ALBUMIN 3.1* 2.4*   No results for input(s):  LIPASE, AMYLASE in the last 168 hours. No results for input(s): AMMONIA in the last 168 hours.  ABG    Component Value Date/Time   PHART 7.401 09/13/2019 0905   PCO2ART 50.1 (H) 09/13/2019 0905   PO2ART 69.7 (L) 09/13/2019 0905   HCO3 30.5 (H) 09/13/2019 0905   O2SAT 93.1 09/13/2019 0905     Coagulation Profile: No results for input(s): INR, PROTIME in the last 168 hours.  Cardiac Enzymes: No results for input(s): CKTOTAL, CKMB, CKMBINDEX, TROPONINI in the last 168 hours.  HbA1C: Hgb A1c MFr Bld  Date/Time Value Ref Range Status  01/20/2018 09:17 AM 6.0 (H) <5.7 % of total Hgb Final    Comment:    For someone without known diabetes, a hemoglobin  A1c value between 5.7% and 6.4% is consistent with prediabetes and should be confirmed with a  follow-up test. . For someone with known diabetes, a value <7% indicates that their diabetes is well controlled. A1c targets should be individualized based on duration of diabetes, age, comorbid conditions, and other considerations. . This assay result is consistent with an increased risk of diabetes. . Currently, no consensus exists regarding use of hemoglobin A1c for diagnosis of diabetes for children. .     CBG: Recent Labs  Lab 09/13/19 1208  GLUCAP 146*    Critical care time: The patient is critically ill with multiple organ systems failure and requires high complexity decision making for assessment and support, frequent evaluation and titration of therapies, application of advanced monitoring technologies and extensive interpretation of multiple databases.  Critical care time 39 mins. This represents my time independent of the NPs time taking care of the pt. This is excluding procedures.    Audria Nine DO Pager: [phone] After hours pager: [phone]  [facility] 09/14/2019, 7:53 AM

## 2019-09-15 ENCOUNTER — Inpatient Hospital Stay (HOSPITAL_COMMUNITY): Payer: Medicare Other

## 2019-09-15 LAB — CBC WITH DIFFERENTIAL/PLATELET
Abs Immature Granulocytes: 0.1 10*3/uL — ABNORMAL HIGH (ref 0.00–0.07)
Basophils Absolute: 0 10*3/uL (ref 0.0–0.1)
Basophils Relative: 0 %
Eosinophils Absolute: 0 10*3/uL (ref 0.0–0.5)
Eosinophils Relative: 0 %
HCT: 33 % — ABNORMAL LOW (ref 36.0–46.0)
Hemoglobin: 10.9 g/dL — ABNORMAL LOW (ref 12.0–15.0)
Immature Granulocytes: 1 %
Lymphocytes Relative: 5 %
Lymphs Abs: 0.6 10*3/uL — ABNORMAL LOW (ref 0.7–4.0)
MCH: 29 pg (ref 26.0–34.0)
MCHC: 33 g/dL (ref 30.0–36.0)
MCV: 87.8 fL (ref 80.0–100.0)
Monocytes Absolute: 0.5 10*3/uL (ref 0.1–1.0)
Monocytes Relative: 3 %
Neutro Abs: 12.4 10*3/uL — ABNORMAL HIGH (ref 1.7–7.7)
Neutrophils Relative %: 91 %
Platelets: 582 10*3/uL — ABNORMAL HIGH (ref 150–400)
RBC: 3.76 MIL/uL — ABNORMAL LOW (ref 3.87–5.11)
RDW: 14.6 % (ref 11.5–15.5)
WBC: 13.6 10*3/uL — ABNORMAL HIGH (ref 4.0–10.5)
nRBC: 0 % (ref 0.0–0.2)

## 2019-09-15 LAB — MPO/PR-3 (ANCA) ANTIBODIES
ANCA Proteinase 3: <3.5 U/mL (ref 0.0–3.5)
Myeloperoxidase Abs: <9 U/mL (ref 0.0–9.0)

## 2019-09-15 LAB — CYCLIC CITRUL PEPTIDE ANTIBODY, IGG/IGA: CCP Antibodies IgG/IgA: 12 units (ref 0–19)

## 2019-09-15 LAB — BASIC METABOLIC PANEL
Anion gap: 12 (ref 5–15)
BUN: 35 mg/dL — ABNORMAL HIGH (ref 8–23)
CO2: 33 mmol/L — ABNORMAL HIGH (ref 22–32)
Calcium: 9.7 mg/dL (ref 8.9–10.3)
Chloride: 98 mmol/L (ref 98–111)
Creatinine, Ser: 0.8 mg/dL (ref 0.44–1.00)
GFR calc Af Amer: >60 mL/min (ref >60)
GFR calc non Af Amer: >60 mL/min (ref >60)
Glucose, Bld: 168 mg/dL — ABNORMAL HIGH (ref 70–99)
Potassium: 4 mmol/L (ref 3.5–5.1)
Sodium: 143 mmol/L (ref 135–145)

## 2019-09-15 MED ORDER — LABETALOL HCL 5 MG/ML IV SOLN
10.0000 mg | INTRAVENOUS | Status: DC | PRN
  Administered 2019-09-15 – 2019-09-17 (×8): 10 mg via INTRAVENOUS
  Filled 2019-09-15 (×9): qty 4

## 2019-09-15 MED ORDER — CLONIDINE HCL 0.2 MG PO TABS
0.2000 mg | ORAL_TABLET | Freq: Once | ORAL | Status: AC
  Administered 2019-09-16: 0.2 mg via ORAL
  Filled 2019-09-15: qty 1

## 2019-09-15 NOTE — Progress Notes (Signed)
NAME:  Charlotte [name], MRN:  [id number], DOB:  1928/12/16, LOS: 5 ADMISSION DATE:  09/19/2019, CONSULTATION DATE:  9/23 REFERRING MD:  Andres Labrum, CHIEF COMPLAINT:  Acute respiratory failure   Brief History   85 year old female admitted 9/19 w/ working dx of CAP/atypical PNA. Placed on azith and rocephin. COVID neg. Supported on supplemental oxgyen, given IV lasix and in spite of therapy continued to decline to point she required 100% FI02 on BIPAP w/ rapid desaturation to 70s when bipap seal broken. PCCM asked to see 9/23  History of present illness   This is a pleasant 85 year old female patient who was admitted on 9/19 with chief complaint of worsening shortness of breath, left lateral/lower back/chest discomfort, non-productive cough and possible fever. Denied HA, sore throat, sick exposure, loss of taste or smell. No sig swelling no orthopnea not waking up short of breath.  In ED CT w/ bilateral R>L multilobar GG airspace disease chronic contraction BTX left anterior chest and small L>R effusions. BNP was neg, inflammatory markers including CRP, LDH and ferritin all elevated. COVID negative.   Hospital course  9/19 Admitted w/ working dx of atypical PNA, placed on azith and rocephin and provided supplemental oxygen. Placed on respiratory isolation until repeat COVID returned 9/20 thru 9/23 progressive hypoxia. Repeat COVID neg. Diuresed 9/22 w/ concern that may be component of pulmonary edema. Later placed on BIPAP that evening when saturations could not be maintained above 90% even on heated high flow. PCCM asked to see the am 9/23 for progressive respiratory failure   Significant Hospital Events    9/23 -24-bipap  Consults:  9/24 CCM  Procedures:    Significant Diagnostic Tests:   CT chest 9/19: 1. There is no evidence for pulmonary embolus. 2. There are areas of ground-glass opacification primarily within the right upper lobe, right middle lobe and bilateral lower lobes. These areas  demonstrate interlobular septal thickening. These findings are nonspecific and may be related to pulmonary edema, acute interstitial pneumonia, drug toxicity, versus less likely diffuse alveolar hemorrhage. 3. Trace bilateral pleural effusions, left greater than right. 4. Redemonstration of scarring and traction bronchiectasis within the lingula presumably related to the patient's reported history of breast radiation.  ECHO 9/23: LVEF 123456, some diastolic dysfunction, RVSP 59.7  Serologies 9/23: ANA with reflex:  ANA, IFA: neg ANCA titer:neg Anti--DNA antibody, double-stranded: Anti-- J01 antibody, IgG neg Anti--scleredema antibody: neg Cyclic citrulline peptide antibody, IgG- IgA: MPO/PR-3 antibodies: Sed rate: + 112 Rheumatoid factor: +30 Sjogren's syndrome-a: Sjogren's syndrome-:B:    Micro Data:  COVID -19 9/20: neg  Urine strep antigen 9/23: negative Urine Legionella antigen 9/23: neg Respiratory viral panel on 9/23: negative  Antimicrobials:  Rocephin 9/19:-> azithromycin 9/19->9/24   Interim history/subjective:    Afebrile last 24 hours, remains on BiPAP 12/8 full facemask, critically ill FiO2 down to 60%  Objective   Blood pressure (!) 145/64, pulse 78, temperature 98.5 F (36.9 C), temperature source Axillary, resp. rate (!) 28, height 5\' 3"  (1.6 m), weight 61.2 kg, SpO2 98 %.    FiO2 (%):  [90 %-100 %] 100 %   Intake/Output Summary (Last 24 hours) at 09/14/2019 0753 Last data filed at 09/14/2019 0600 Gross per 24 hour  Intake 1270.65 ml  Output 550 ml  Net 720.65 ml   Filed Weights   09/05/2019 1625  Weight: 61.2 kg    Examination: General: Elderly black female resting comfortably on BiPAP, no distress HENT: Mild JVD mucous membranes unassessable with BiPAP mask  Lungs: Diffuse dry rales bilateral Cardiovascular: Soft systolic murmur 3 out of 6 currently sinus rhythm Abdomen: Soft not tender Extremities: no lower extremity edema pulses are palpable  and strong Neuro: Awake oriented no focal deficits  Chest x-ray 9/25 personally reviewed which shows bilateral unchanged multifocal infiltrates  Labs show stable mild leukocytosis with left shift, normal sodium and electrolytes   Resolved Hospital Problem list     Assessment & Plan:   Acute hypoxic respiratory failure in the setting of bilateral multi lobar groundglass pulmonary infiltrates -Etiology is unclear differential diagnosis includes: Atypical infection, viral pneumonia (nega RVP),  less likely pulmonary edema -- BNP low & echocardiogram with RVSP in 50's but normal LVEF and some diastolic dysfunction  or autoimmune related pneumonitis -serology all negative,  -there are case reports of people developing autoimmune processes on Arimidex long-term.  Plan -She was transitioned to high flow nasal cannula and seems to tolerate, continue intermittent BiPAP and nightly -if she declines she would want short-term intubation however we would limit her length to somewhere between 7 and 10 days, she would not want tracheostomy or long-term ventilation -Continue Rocephin -dc azithro -cont empiric Solu-Medrol at 40 mg every 8 hours   History of hypertension Plan Continuing felodipin as well as hydralazine  Mild AKI -improved Plan -Diuresis as needed to keep even   History of depression Plan Continue SSRI    Best practice:  Diet: N.p.o. except for medications Pain/Anxiety/Delirium protocol (if indicated): Not applicable VAP protocol (if indicated): Not applicable DVT prophylaxis: [place] heparin  GI prophylaxis: NA Glucose control: NA Mobility: BR Code Status: limited code.  She would be DO NOT RESUSCITATE should she suffer a cardiac or respiratory arrest. She would want short-term mechanical ventilation should her respiratory status decline Family Communication:  son and daughter updated on 9/23 Disposition: ICU   The patient is critically ill with multiple organ  systems failure and requires high complexity decision making for assessment and support, frequent evaluation and titration of therapies, application of advanced monitoring technologies and extensive interpretation of multiple databases. Critical Care Time devoted to patient care services described in this note independent of APP/resident  time is 35 minutes.   Kara Mead MD. Shade Flood. [facility] Pager [phone] If no response call [phone]   09/15/2019

## 2019-09-16 ENCOUNTER — Inpatient Hospital Stay (HOSPITAL_COMMUNITY): Payer: Medicare Other

## 2019-09-16 DIAGNOSIS — J849 Interstitial pulmonary disease, unspecified: Secondary | ICD-10-CM

## 2019-09-16 DIAGNOSIS — J8410 Pulmonary fibrosis, unspecified: Secondary | ICD-10-CM

## 2019-09-16 DIAGNOSIS — I5031 Acute diastolic (congestive) heart failure: Secondary | ICD-10-CM

## 2019-09-16 DIAGNOSIS — E876 Hypokalemia: Secondary | ICD-10-CM

## 2019-09-16 LAB — CBC
HCT: 31.7 % — ABNORMAL LOW (ref 36.0–46.0)
Hemoglobin: 10 g/dL — ABNORMAL LOW (ref 12.0–15.0)
MCH: 28.2 pg (ref 26.0–34.0)
MCHC: 31.5 g/dL (ref 30.0–36.0)
MCV: 89.3 fL (ref 80.0–100.0)
Platelets: 622 10*3/uL — ABNORMAL HIGH (ref 150–400)
RBC: 3.55 MIL/uL — ABNORMAL LOW (ref 3.87–5.11)
RDW: 14.6 % (ref 11.5–15.5)
WBC: 12.9 10*3/uL — ABNORMAL HIGH (ref 4.0–10.5)
nRBC: 0 % (ref 0.0–0.2)

## 2019-09-16 LAB — BASIC METABOLIC PANEL
Anion gap: 12 (ref 5–15)
BUN: 31 mg/dL — ABNORMAL HIGH (ref 8–23)
CO2: 33 mmol/L — ABNORMAL HIGH (ref 22–32)
Calcium: 9.5 mg/dL (ref 8.9–10.3)
Chloride: 100 mmol/L (ref 98–111)
Creatinine, Ser: 0.65 mg/dL (ref 0.44–1.00)
GFR calc Af Amer: >60 mL/min (ref >60)
GFR calc non Af Amer: >60 mL/min (ref >60)
Glucose, Bld: 168 mg/dL — ABNORMAL HIGH (ref 70–99)
Potassium: 3.6 mmol/L (ref 3.5–5.1)
Sodium: 145 mmol/L (ref 135–145)

## 2019-09-16 LAB — MAGNESIUM: Magnesium: 2.2 mg/dL (ref 1.7–2.4)

## 2019-09-16 LAB — PHOSPHORUS: Phosphorus: 2.8 mg/dL (ref 2.5–4.6)

## 2019-09-16 MED ORDER — GLYCERIN (LAXATIVE) 2.1 G RE SUPP
1.0000 | RECTAL | Status: DC | PRN
  Filled 2019-09-16: qty 1

## 2019-09-16 MED ORDER — SERTRALINE HCL 50 MG PO TABS
50.0000 mg | ORAL_TABLET | Freq: Every day | ORAL | Status: DC
  Administered 2019-09-16 – 2019-09-19 (×3): 50 mg via ORAL
  Filled 2019-09-16 (×3): qty 1

## 2019-09-16 MED ORDER — SENNOSIDES-DOCUSATE SODIUM 8.6-50 MG PO TABS
2.0000 | ORAL_TABLET | Freq: Two times a day (BID) | ORAL | Status: DC
  Administered 2019-09-16 – 2019-09-17 (×3): 2 via ORAL
  Filled 2019-09-16 (×4): qty 2

## 2019-09-16 MED ORDER — IPRATROPIUM-ALBUTEROL 0.5-2.5 (3) MG/3ML IN SOLN
3.0000 mL | Freq: Four times a day (QID) | RESPIRATORY_TRACT | Status: DC
  Administered 2019-09-16 – 2019-09-25 (×37): 3 mL via RESPIRATORY_TRACT
  Filled 2019-09-16 (×37): qty 3

## 2019-09-16 NOTE — Progress Notes (Signed)
Assisted tele visit to patient with daughter.  Ruthia Person, Alizah P, RN  

## 2019-09-16 NOTE — Progress Notes (Signed)
NAME:  Charlotte [name], MRN:  [id number], DOB:  11-21-1928, LOS: 5 ADMISSION DATE:  09/11/2019, CONSULTATION DATE:  9/23 REFERRING MD:  Andres Labrum, CHIEF COMPLAINT:  Acute respiratory failure   Brief History   85 year old female admitted 9/19 w/ working dx of CAP/atypical PNA. Placed on azith and rocephin. COVID neg. Supported on supplemental oxgyen, given IV lasix and in spite of therapy continued to decline to point she required 100% FI02 on BIPAP w/ rapid desaturation to 70s when bipap seal broken. PCCM asked to see 9/23  History of present illness   This is a pleasant 84 year old female patient who was admitted on 9/19 with chief complaint of worsening shortness of breath, left lateral/lower back/chest discomfort, non-productive cough and possible fever. Denied HA, sore throat, sick exposure, loss of taste or smell. No sig swelling no orthopnea not waking up short of breath.  In ED CT w/ bilateral R>L multilobar GG airspace disease chronic contraction BTX left anterior chest and small L>R effusions. BNP was neg, inflammatory markers including CRP, LDH and ferritin all elevated. COVID negative.    Hospital course  9/19 Admitted w/ working dx of atypical PNA, placed on azith and rocephin and provided supplemental oxygen. Placed on respiratory isolation until repeat COVID returned 9/20 thru 9/23 progressive hypoxia. Repeat COVID neg. Diuresed 9/22 w/ concern that may be component of pulmonary edema. Later placed on BIPAP that evening when saturations could not be maintained above 90% even on heated high flow. PCCM asked to see the am 9/23 for progressive respiratory failure  9/26: Slowly improving   Significant Hospital Events   9/23 -24-bipap  Consults:  9/24 CCM  Procedures:    Significant Diagnostic Tests:   CT chest 9/19: 1. There is no evidence for pulmonary embolus. 2. There are areas of ground-glass opacification primarily within the right upper lobe, right middle lobe and bilateral  lower lobes. These areas demonstrate interlobular septal thickening. These findings are nonspecific and may be related to pulmonary edema, acute interstitial pneumonia, drug toxicity, versus less likely diffuse alveolar hemorrhage. 3. Trace bilateral pleural effusions, left greater than right. 4. Redemonstration of scarring and traction bronchiectasis within the lingula presumably related to the patient's reported history of breast radiation.  ECHO 9/23: LVEF 123456, some diastolic dysfunction, RVSP 59.7  Serologies 9/23: ANA with reflex:  ANA, IFA: neg ANCA titer:neg Anti--DNA antibody, double-stranded: neg  Anti-- J01 antibody, IgG neg Anti--scleredema antibody: neg Cyclic citrulline peptide antibody, IgG- IgA: MPO/PR-3 antibodies: Sed rate: + 112 Rheumatoid factor: +30 Sjogren's syndrome-a: Sjogren's syndrome-:B:    Micro Data:  COVID -19 9/20: neg  Urine strep antigen 9/23: negative Urine Legionella antigen 9/23: neg Respiratory viral panel on 9/23: negative  Antimicrobials:  Rocephin 9/19:-> Azithromycin 9/19->9/24  Interim history/subjective:   Patient awake alert.  Tolerating BiPAP therapy.  Critically ill in the intensive care unit  Objective   Blood pressure (!) 145/64, pulse 78, temperature 98.5 F (36.9 C), temperature source Axillary, resp. rate (!) 28, height 5\' 3"  (1.6 m), weight 61.2 kg, SpO2 98 %.    FiO2 (%):  [90 %-100 %] 100 %   Intake/Output Summary (Last 24 hours) at 09/14/2019 0753 Last data filed at 09/14/2019 0600 Gross per 24 hour  Intake 1270.65 ml  Output 550 ml  Net 720.65 ml   Filed Weights   09/06/2019 1625  Weight: 61.2 kg    Examination: General: Elderly female, comfortable on BiPAP. HENT: Mucous membranes dry.  Mepilex pads on face to  help prevent breakdown Lungs: Dry crackles bilaterally Cardiovascular: Regular rate and rhythm, [id number], systolic murmur left lower sternal border Abdomen: Soft, nontender nondistended Extremities: No  significant lower extremity edema or rash Neuro: Awake alert oriented following commands no focal deficit  Chest x-ray: No real significant change.  Interstitial infiltrates still present. The patient's images have been independently reviewed by me.     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 -Etiology is unclear differential diagnosis includes: Atypical infection, viral pneumonia (nega RVP),  less likely pulmonary edema -- BNP low & echocardiogram with RVSP in 50's but normal LVEF and some diastolic dysfunction or autoimmune related pneumonitis -serology all negative -there are case reports of people developing autoimmune processes on Arimidex long-term. -I expect this is potentially a viral or drug-related pneumonitis.  Presenting, behaving and recovering like an acute ILD or exacerbation of underlying disease.   Plan - Currently still on BiPAP.  Able to transition to [place] high flow catheter. - Would prefer the patient be on a titratable heated high flow nasal cannula at high liter flow however the health system does not have enough of these machines available at this time and they are currently in use. -Patient has stated that she would be okay with short-term intubation however currently is a DNR if she arrests. - She does appear to be slowly improving.  Will complete short course of antimicrobials. - Agree with continued Solu-Medrol dosing and will taper this slowly.  History of hypertension Plan - Continuing felodipin as well as hydralazine  Mild AKI -improved Plan - diuresis as needed - will improve   History of depression Plan - continue ssri    Best practice:  Diet: N.p.o. except for medications Pain/Anxiety/Delirium  protocol (if indicated): Not applicable VAP protocol (if indicated): Not applicable DVT prophylaxis:  heparin  GI prophylaxis: NA Glucose control: NA Mobility: BR Code Status: limited code.  She would be DO NOT RESUSCITATE should she suffer a cardiac or respiratory arrest. She would want short-term mechanical ventilation should her respiratory status decline Family Communication:  son and daughter updated on 9/23 Disposition: ICU  This patient is critically ill with multiple organ system failure; which, requires frequent high complexity decision making, assessment, support, evaluation, and titration of therapies. This was completed through the application of advanced monitoring technologies and extensive interpretation of multiple databases. During this encounter critical care time was devoted to patient care services described in this note for 35 minutes.  Garner Nash, DO [facility] 09/16/2019 8:14 AM  Personal pager: [phone] If unanswered, please page CCM On-call: [phone]

## 2019-09-17 DIAGNOSIS — J189 Pneumonia, unspecified organism: Secondary | ICD-10-CM

## 2019-09-17 LAB — CBC WITH DIFFERENTIAL/PLATELET
Abs Immature Granulocytes: 0.1 10*3/uL — ABNORMAL HIGH (ref 0.00–0.07)
Basophils Absolute: 0 10*3/uL (ref 0.0–0.1)
Basophils Relative: 0 %
Eosinophils Absolute: 0 10*3/uL (ref 0.0–0.5)
Eosinophils Relative: 0 %
HCT: 36 % (ref 36.0–46.0)
Hemoglobin: 11.2 g/dL — ABNORMAL LOW (ref 12.0–15.0)
Immature Granulocytes: 1 %
Lymphocytes Relative: 7 %
Lymphs Abs: 0.8 10*3/uL (ref 0.7–4.0)
MCH: 28.2 pg (ref 26.0–34.0)
MCHC: 31.1 g/dL (ref 30.0–36.0)
MCV: 90.7 fL (ref 80.0–100.0)
Monocytes Absolute: 0.4 10*3/uL (ref 0.1–1.0)
Monocytes Relative: 3 %
Neutro Abs: 10.2 10*3/uL — ABNORMAL HIGH (ref 1.7–7.7)
Neutrophils Relative %: 89 %
Platelets: 660 10*3/uL — ABNORMAL HIGH (ref 150–400)
RBC: 3.97 MIL/uL (ref 3.87–5.11)
RDW: 14.7 % (ref 11.5–15.5)
WBC: 11.5 10*3/uL — ABNORMAL HIGH (ref 4.0–10.5)
nRBC: 0 % (ref 0.0–0.2)

## 2019-09-17 LAB — BASIC METABOLIC PANEL
Anion gap: 12 (ref 5–15)
BUN: 35 mg/dL — ABNORMAL HIGH (ref 8–23)
CO2: 35 mmol/L — ABNORMAL HIGH (ref 22–32)
Calcium: 9.8 mg/dL (ref 8.9–10.3)
Chloride: 105 mmol/L (ref 98–111)
Creatinine, Ser: 0.74 mg/dL (ref 0.44–1.00)
GFR calc Af Amer: >60 mL/min (ref >60)
GFR calc non Af Amer: >60 mL/min (ref >60)
Glucose, Bld: 142 mg/dL — ABNORMAL HIGH (ref 70–99)
Potassium: 3.7 mmol/L (ref 3.5–5.1)
Sodium: 152 mmol/L — ABNORMAL HIGH (ref 135–145)

## 2019-09-17 MED ORDER — CHLORTHALIDONE 25 MG PO TABS
25.0000 mg | ORAL_TABLET | Freq: Every day | ORAL | Status: DC
  Administered 2019-09-17: 25 mg via ORAL
  Filled 2019-09-17 (×3): qty 1

## 2019-09-17 MED ORDER — CARVEDILOL 12.5 MG PO TABS
12.5000 mg | ORAL_TABLET | Freq: Two times a day (BID) | ORAL | Status: DC
  Administered 2019-09-17 – 2019-09-19 (×2): 12.5 mg via ORAL
  Filled 2019-09-17 (×2): qty 1

## 2019-09-17 MED ORDER — AMLODIPINE BESYLATE 5 MG PO TABS: 5.0000 mg | ORAL_TABLET | Freq: Every day | ORAL | Status: DC

## 2019-09-17 MED ORDER — METHYLPREDNISOLONE SODIUM SUCC 40 MG IJ SOLR
40.0000 mg | Freq: Two times a day (BID) | INTRAMUSCULAR | Status: DC
  Administered 2019-09-17 – 2019-09-19 (×4): 40 mg via INTRAVENOUS
  Filled 2019-09-17 (×4): qty 1

## 2019-09-17 NOTE — Progress Notes (Signed)
RN had placed pt back on BIPAP due to desaturation to 78%. Pt was on 10/5 and 60% with increase in SPO2 to around 88%. RT increased FIO2 to 70% and pt around 93% but still very tachypnic and increased WOB. RT took pt off BIPAP and placed on Heated High Flow [place] at 35 L and Fio2 of 100%. WOB seems to have diminished slightly, SPO2 now 98% and RR 26. Pt is tolerating well at this time. BBS clear/diminished with few scattered crackles.

## 2019-09-17 NOTE — Progress Notes (Signed)
RN noticed increased WOB and decreased SPo2 after patient being placed on BIPAP. RT asked pt if it was too much/too strong and she stated yes. The patient looked better on HFNC, but wears CPAP at night so RT had placed pt on for Sleep. RT was placed back on HFNC at 15L and WOB has calmed some.

## 2019-09-17 NOTE — Progress Notes (Signed)
Assisted tele visit to patient with family member.  Kawana Hegel, Carolie P, RN  

## 2019-09-17 NOTE — Progress Notes (Signed)
NAME:  Charlotte [name], MRN:  [id number], DOB:  15-Feb-1928, LOS: 5 ADMISSION DATE:  09/14/2019, CONSULTATION DATE:  9/23 REFERRING MD:  Andres Labrum, CHIEF COMPLAINT:  Acute respiratory failure   Brief History   85 year old female admitted 9/19 w/ working dx of CAP/atypical PNA. Placed on azith and rocephin. COVID neg. Supported on supplemental oxgyen, given IV lasix and in spite of therapy continued to decline to point she required 100% FI02 on BIPAP w/ rapid desaturation to 70s when bipap seal broken. PCCM asked to see 9/23  History of present illness   This is a pleasant 85 year old female patient who was admitted on 9/19 with chief complaint of worsening shortness of breath, left lateral/lower back/chest discomfort, non-productive cough and possible fever. Denied HA, sore throat, sick exposure, loss of taste or smell. No sig swelling no orthopnea not waking up short of breath.  In ED CT w/ bilateral R>L multilobar GG airspace disease chronic contraction BTX left anterior chest and small L>R effusions. BNP was neg, inflammatory markers including CRP, LDH and ferritin all elevated. COVID negative.    Hospital course  9/19 Admitted w/ working dx of atypical PNA, placed on azith and rocephin and provided supplemental oxygen. Placed on respiratory isolation until repeat COVID returned 9/20 thru 9/23 progressive hypoxia. Repeat COVID neg. Diuresed 9/22 w/ concern that may be component of pulmonary edema. Later placed on BIPAP that evening when saturations could not be maintained above 90% even on heated high flow. PCCM asked to see the am 9/23 for progressive respiratory failure  9/26: Slowly improving   Significant Hospital Events   9/23 -24-bipap  Consults:  9/24 CCM  Procedures:    Significant Diagnostic Tests:   CT chest 9/19: 1. There is no evidence for pulmonary embolus. 2. There are areas of ground-glass opacification primarily within the right upper lobe, right middle lobe and bilateral  lower lobes. These areas demonstrate interlobular septal thickening. These findings are nonspecific and may be related to pulmonary edema, acute interstitial pneumonia, drug toxicity, versus less likely diffuse alveolar hemorrhage. 3. Trace bilateral pleural effusions, left greater than right. 4. Redemonstration of scarring and traction bronchiectasis within the lingula presumably related to the patient's reported history of breast radiation.  ECHO 9/23: LVEF 123456, some diastolic dysfunction, RVSP 59.7  Serologies 9/23: ANA with reflex:  ANA, IFA: neg ANCA titer:neg Anti--DNA antibody, double-stranded: neg  Anti-- J01 antibody, IgG neg Anti--scleredema antibody: neg Cyclic citrulline peptide antibody, IgG- IgA: MPO/PR-3 antibodies: Sed rate: + 112 Rheumatoid factor: +30 Sjogren's syndrome-a: Sjogren's syndrome-:B:    Micro Data:  COVID -19 9/20: neg  Urine strep antigen 9/23: negative Urine Legionella antigen 9/23: neg Respiratory viral panel on 9/23: negative  Antimicrobials:  Rocephin 9/19:-> Azithromycin 9/19->9/24  Interim history/subjective:   Doing better this morning.  Switched over to heated high flow nasal cannula.  Alert able to answer questions.  Still requiring high liter flow.  Critically ill respiratory failure.  Objective   Blood pressure (!) 145/64, pulse 78, temperature 98.5 F (36.9 C), temperature source Axillary, resp. rate (!) 28, height 5\' 3"  (1.6 m), weight 61.2 kg, SpO2 98 %.    FiO2 (%):  [90 %-100 %] 100 %   Intake/Output Summary (Last 24 hours) at 09/14/2019 0753 Last data filed at 09/14/2019 0600 Gross per 24 hour  Intake 1270.65 ml  Output 550 ml  Net 720.65 ml   Filed Weights   09/01/2019 1625  Weight: 61.2 kg    Examination: General:  Elderly female, comfortable on high flow nasal cannula HENT: Mucous membranes dry Mepilex pad on face to help skin breakdown when transition to BiPAP if needed.y Lungs: Dry crackles bilaterally  Cardiovascular: Regular rate and rhythm, S1-S2 no MRG Abdomen: Soft, nontender nondistended Extremities: No significant lower extremity edema or rash Neuro: Awake alert following commands, no focal deficit  Chest x-ray: No real significant change.  Interstitial infiltrates still present. The patient's images have been independently reviewed by me.     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 -Etiology is unclear differential diagnosis includes: Atypical infection, viral pneumonia (nega RVP),  less likely pulmonary edema -- BNP low & echocardiogram with RVSP in 50's but normal LVEF and some diastolic dysfunction or autoimmune related pneumonitis -serology all negative -there are case reports of people developing autoimmune processes on Arimidex long-term. -I expect this is potentially a viral or drug-related pneumonitis.  Presenting, behaving and recovering like an acute ILD or exacerbation of underlying disease.   Plan -Continue to wean FiO2 as tolerated to maintain SPO2 greater than 88%. - Goal SPO2 greater than 88%. - Patient doing much better on heated high flow nasal cannula today. -Would like to see her up in chair if able today. -We will need PT OT evaluations. -Wean IV steroids to every 12 hours 40 mg Solu-Medrol  History of hypertension Plan -Add carvedilol 12.5 mg p.o. twice daily - Add chlorthalidone 25 mg -Continue labetalol as needed for systolic blood pressures greater than 180.  Mild AKI -improved Hypernatremia Plan -Likely related to poor p.o. intake. -Continue to observe -Avoiding additional diuresis  History of depression Plan -Continue SSRI   Best practice:  Diet: Adv as tolerated  Pain/Anxiety/Delirium  protocol (if indicated): Not applicable VAP protocol (if indicated): Not applicable DVT prophylaxis: [place] heparin  GI prophylaxis: NA Glucose control: NA Mobility: BR Code Status: limited code.  She would be DO NOT RESUSCITATE should she suffer a cardiac or respiratory arrest. She would want short-term mechanical ventilation should her respiratory status decline Family Communication:  son and daughter updated on 9/23 Disposition: ICU   This patient is critically ill with multiple organ system failure; which, requires frequent high complexity decision making, assessment, support, evaluation, and titration of therapies. This was completed through the application of advanced monitoring technologies and extensive interpretation of multiple databases. During this encounter critical care time was devoted to patient care services described in this note for 32 minutes.  Garner Nash, DO [facility] 09/17/2019 8:00 AM  Personal pager: [phone] If unanswered, please page CCM On-call: [phone]

## 2019-09-17 NOTE — Progress Notes (Signed)
[facility] Progress Note Patient Name: Charlotte [name] DOB: 12-Sep-1928 MRN: [id number]   Date of Service  09/17/2019  HPI/Events of Note  No AM labs  eICU Interventions  BMP, CBC ordered     Intervention Category Minor Interventions: Other:  Elmer Sow 09/17/2019, 6:22 AM

## 2019-09-18 ENCOUNTER — Inpatient Hospital Stay (HOSPITAL_COMMUNITY): Payer: Medicare Other

## 2019-09-18 DIAGNOSIS — Z9989 Dependence on other enabling machines and devices: Secondary | ICD-10-CM

## 2019-09-18 MED ORDER — ENSURE ENLIVE PO LIQD: 237.0000 mL | Freq: Three times a day (TID) | ORAL | Status: DC

## 2019-09-18 MED ORDER — DEXTROSE-NACL 5-0.45 % IV SOLN
INTRAVENOUS | Status: DC
  Administered 2019-09-18: 10:00:00 via INTRAVENOUS

## 2019-09-18 MED ORDER — LABETALOL HCL 5 MG/ML IV SOLN
10.0000 mg | INTRAVENOUS | Status: DC | PRN
  Filled 2019-09-18: qty 4

## 2019-09-18 NOTE — Progress Notes (Signed)
Assisted tele visit to patient with family member.  Marjo Grosvenor R, RN  

## 2019-09-18 NOTE — Progress Notes (Signed)
eLink Physician-Brief Progress Note Patient Name: Charlotte [name] DOB: 1928/05/01 MRN: [id number]   Date of Service  09/18/2019  HPI/Events of Note  Notified of tachypnea in the 81s. On camera assessment patient on AVAPS 7/480/100%/8 MV 12  eICU Interventions  Maintain current NIV settings. RR high 20s Obtain CXR     Intervention Category Intermediate Interventions: Respiratory distress - evaluation and management  Judd Lien 09/18/2019, 4:48 AM

## 2019-09-18 NOTE — Progress Notes (Addendum)
NAME:  Charlotte [name], MRN:  [id number], DOB:  02-27-1928, LOS: 5 ADMISSION DATE:  08/22/2019, CONSULTATION DATE:  9/23 REFERRING MD:  Andres Labrum, CHIEF COMPLAINT:  Acute respiratory failure   Brief History   85 year old female admitted 9/19 w/ working dx of CAP/atypical PNA. Placed on azith and rocephin. COVID neg. Supported on supplemental oxgyen, given IV lasix and in spite of therapy continued to decline to point she required 100% FI02 on BIPAP w/ rapid desaturation to 70s when bipap seal broken. PCCM asked to see 9/23  History of present illness   This is a pleasant 85 year old female patient who was admitted on 9/19 with chief complaint of worsening shortness of breath, left lateral/lower back/chest discomfort, non-productive cough and possible fever. Denied HA, sore throat, sick exposure, loss of taste or smell. No sig swelling no orthopnea not waking up short of breath.  In ED CT w/ bilateral R>L multilobar GG airspace disease chronic contraction BTX left anterior chest and small L>R effusions. BNP was neg, inflammatory markers including CRP, LDH and ferritin all elevated. COVID negative.    Hospital course  9/19 Admitted w/ working dx of atypical PNA, placed on azith and rocephin and provided supplemental oxygen. Placed on respiratory isolation until repeat COVID returned 9/20 thru 9/23 progressive hypoxia. Repeat COVID neg. Diuresed 9/22 w/ concern that may be component of pulmonary edema. Later placed on BIPAP that evening when saturations could not be maintained above 90% even on heated high flow. PCCM asked to see the am 9/23 for progressive respiratory failure  9/26: Slowly improving  9/28: placed back on BiPAP overnight for tachypnea.   Significant Hospital Events   9/23 -24-bipap 9/28 on bipap from heated high flow   Consults:  9/24 CCM  Procedures:    Significant Diagnostic Tests:   CT chest 9/19: 1. There is no evidence for pulmonary embolus. 2. There are areas of  ground-glass opacification primarily within the right upper lobe, right middle lobe and bilateral lower lobes. These areas demonstrate interlobular septal thickening. These findings are nonspecific and may be related to pulmonary edema, acute interstitial pneumonia, drug toxicity, versus less likely diffuse alveolar hemorrhage. 3. Trace bilateral pleural effusions, left greater than right. 4. Redemonstration of scarring and traction bronchiectasis within the lingula presumably related to the patient's reported history of breast radiation.  ECHO 9/23: LVEF 123456, some diastolic dysfunction, RVSP 59.7  Serologies 9/23: ANA with reflex:  ANA, IFA: neg ANCA titer:neg Anti--DNA antibody, double-stranded: neg  Anti-- J01 antibody, IgG neg Anti--scleredema antibody: neg Cyclic citrulline peptide antibody, IgG- IgA: MPO/PR-3 antibodies: Sed rate: + 112 Rheumatoid factor: +30 Sjogren's syndrome-a: Sjogren's syndrome-:B:   CXR 9/28: Cardiomegaly. Interstitial opacities.   Micro Data:  COVID -19 9/20: neg  Urine strep antigen 9/23: negative Urine Legionella antigen 9/23: neg Respiratory viral panel on 9/23: negative  Antimicrobials:  Rocephin 9/19:-> Azithromycin 9/19->9/24  Interim history/subjective:   On BiPAP. Some tachypnea overnight.   Objective   Blood pressure (!) 145/64, pulse 78, temperature 98.5 F (36.9 C), temperature source Axillary, resp. rate (!) 28, height 5\' 3"  (1.6 m), weight 61.2 kg, SpO2 98 %.    FiO2 (%):  [90 %-100 %] 100 %   Intake/Output Summary (Last 24 hours) at 09/14/2019 0753 Last data filed at 09/14/2019 0600 Gross per 24 hour  Intake 1270.65 ml  Output 550 ml  Net 720.65 ml   Filed Weights   08/23/2019 1625  Weight: 61.2 kg    Examination: General: WDWN  elderly female, reclined in bed on BiPAP  HENT: NCAT. BiPAP secure. Trachea midline. Pink mm. Anicteric sclera. Lungs: Symmetrical chest expansion. No accessory muscle use. RML rhonchi.   Cardiovascular: RRR s1s2 no rgm. Cap refill < 3 sec Abdomen: soft round ndnt Extremities: Symmetrical bulk and tone, no cyanosis, clubbing or edema Neuro: Awake alert following commands   Resolved Hospital Problem list     Assessment & Plan:   Acute hypoxic respiratory failure -bilateral multi lobar groundglass pulmonary infiltrates -CT chest with evidence of previous radiation-related fibrosis 2/2 breast cancer  -Etiology is unclear- - does not seem to be primarily infectious.  -Pulm edema possibly contributing to small extent; BNP low & echocardiogram with RVSP in 50's but normal LVEF and some diastolic dysfunction.  -Autoimmune related pneumonitis -serology all negative -there are case reports of people developing autoimmune processes on Arimidex long-term. -Potentially a viral or drug-related pneumonitis.  Presenting, behaving and recovering like an acute ILD or exacerbation of underlying disease.  Plan -Continue to wean O2 support as able. Currently on BiPAP. Would like to wean back to heated HFNC  - Goal SpO2 > 88%  -Up to chair if possible -We will need PT OT evaluations. -Solumedrol 40mg  q12hr IV (weaned on 9/27); wean to 40mg  qD tomorrow (9/29)   -If increased RR, would favor low-dose precedex gtt   HTN -Not taking POs on BiPAP  Plan -Carvedilol 12.5 mg BID, chlorthalidone 25 mg -Labetalol IV PRN q1hr  AKI, improved Plan -Continue to monitor UOP -Trend Renal Function -No further diuresis  Hypernatremia, mild -likely hypovolemic. Interval increase in H/H support this.  P -gentle IVF-- D5 1/2 at 30ml/hr -Trend lytes -If able to wean off BiPAP, encourage PO intake.   History of depression Plan -Continue SSRI   Best practice:  Diet: NPO on BiPAP Pain/Anxiety/Delirium protocol (if indicated): Not applicable VAP protocol (if indicated): Not applicable DVT prophylaxis: [place] heparin  GI prophylaxis: NA Glucose control: NA Mobility: BR Code Status: Limited  Code: DNR if sustains cardiac or respiratory arrest. However, if respiratory status declines, she would like short term mechanical vent support  Family Communication: Pending 9/28 Disposition: ICU   CRITICAL CARE Performed by: Cristal Generous   Total critical care time: 35 minutes  Critical care time was exclusive of separately billable procedures and treating other patients. Critical care was necessary to treat or prevent imminent or life-threatening deterioration.  Critical care was time spent personally by me on the following activities: development of treatment plan with patient and/or surrogate as well as nursing, discussions with consultants, evaluation of patient's response to treatment, examination of patient, obtaining history from patient or surrogate, ordering and performing treatments and interventions, ordering and review of laboratory studies, ordering and review of radiographic studies, pulse oximetry and re-evaluation of patient's condition.   Eliseo Gum MSN, AGACNP-BC [place] [id number] If no answer, [id number] 09/18/2019, 8:01 AM   PCCM attending:  85 year old female admitted for acute hypoxemic respiratory failure bilateral infiltrates concerning for an acute ILD.  Bilateral multilobar groundglass pulmonary infiltrates evidence of previous fibrosis based on prior radiation to the chest.  Treating like a acute viral pneumonitis versus drug-related pneumonitis.  Slowly recovering.  Once again desaturated on high flow nasal cannula needed to be switched back to BiPAP. On BiPAP again this morning able to titrate FiO2 down to 60%.  Also dealing with poor p.o. intake due to being on NIPPV and heated high flow.  BP (!) 157/74    Pulse  80    Temp 98.6 F (37 C) (Axillary)    Resp 19    Ht 5\' 3"  (1.6 m)    Wt 61.2 kg    SpO2 100%    BMI 23.91 kg/m   General: Elderly female on BiPAP this morning.  Awake alert following commands. Neck: No  JVD, trachea midline Heart: Regular rate and rhythm, S1-S2 no MRG Lungs: Bilateral ventilated breaths with an NIPPV, no significant crackles or wheeze Extremities: No edema  Labs reviewed Hypernatremia sodium 152 Platelets 660  Assessment: Acute hypoxemic respiratory failure Bilateral multilobar groundglass opacities, likely acute ILD/pneumonitis picture in the setting of previous radiation related fibrosis from breast cancer. Still requiring a recurrent NIPPV use and transitioning on and off heated high flow nasal cannula.  Plan: Patient remains in the ICU for close hemodynamic respiratory support. We will continue to observe  She is high risk for need of intubation and mechanical ventilation.  She has stated in the past to [place] that she would like short-term mechanical support if needed.  But I discussed with patient again yesterday afternoon along with the patient's husband that she would not want to be on this long-term.  I think that intubating her with her current disease state would portend a poor prognosis.  Due to poor p.o. intake we added D5 half normal saline for gentle IV hydration Continue to observe her serum sodium.  This patient is critically ill with multiple organ system failure; which, requires frequent high complexity decision making, assessment, support, evaluation, and titration of therapies. This was completed through the application of advanced monitoring technologies and extensive interpretation of multiple databases. During this encounter critical care time was devoted to patient care services described in this note for 32 minutes.   Garner Nash, DO [facility] 09/18/2019 10:07 AM  Personal pager: [phone] If unanswered, please page CCM On-call: [phone]

## 2019-09-19 ENCOUNTER — Inpatient Hospital Stay (HOSPITAL_COMMUNITY): Payer: Medicare Other

## 2019-09-19 DIAGNOSIS — Z7189 Other specified counseling: Secondary | ICD-10-CM

## 2019-09-19 DIAGNOSIS — Z515 Encounter for palliative care: Secondary | ICD-10-CM

## 2019-09-19 DIAGNOSIS — R531 Weakness: Secondary | ICD-10-CM

## 2019-09-19 LAB — BASIC METABOLIC PANEL
Anion gap: 13 (ref 5–15)
Anion gap: 13 (ref 5–15)
Anion gap: 13 (ref 5–15)
BUN: 61 mg/dL — ABNORMAL HIGH (ref 8–23)
BUN: 63 mg/dL — ABNORMAL HIGH (ref 8–23)
BUN: 67 mg/dL — ABNORMAL HIGH (ref 8–23)
CO2: 34 mmol/L — ABNORMAL HIGH (ref 22–32)
CO2: 31 mmol/L (ref 22–32)
CO2: 32 mmol/L (ref 22–32)
Calcium: 9.9 mg/dL (ref 8.9–10.3)
Calcium: 9.5 mg/dL (ref 8.9–10.3)
Calcium: 9.6 mg/dL (ref 8.9–10.3)
Chloride: 114 mmol/L — ABNORMAL HIGH (ref 98–111)
Chloride: 115 mmol/L — ABNORMAL HIGH (ref 98–111)
Chloride: 113 mmol/L — ABNORMAL HIGH (ref 98–111)
Creatinine, Ser: 0.89 mg/dL (ref 0.44–1.00)
Creatinine, Ser: 1.02 mg/dL — ABNORMAL HIGH (ref 0.44–1.00)
Creatinine, Ser: 1.05 mg/dL — ABNORMAL HIGH (ref 0.44–1.00)
GFR calc Af Amer: >60 mL/min (ref >60)
GFR calc Af Amer: 56 mL/min — ABNORMAL LOW (ref >60)
GFR calc Af Amer: 55 mL/min — ABNORMAL LOW (ref >60)
GFR calc non Af Amer: 57 mL/min — ABNORMAL LOW (ref >60)
GFR calc non Af Amer: 49 mL/min — ABNORMAL LOW (ref >60)
GFR calc non Af Amer: 47 mL/min — ABNORMAL LOW (ref >60)
Glucose, Bld: 157 mg/dL — ABNORMAL HIGH (ref 70–99)
Glucose, Bld: 175 mg/dL — ABNORMAL HIGH (ref 70–99)
Glucose, Bld: 162 mg/dL — ABNORMAL HIGH (ref 70–99)
Potassium: 4 mmol/L (ref 3.5–5.1)
Potassium: 4.1 mmol/L (ref 3.5–5.1)
Potassium: 4.9 mmol/L (ref 3.5–5.1)
Sodium: 161 mmol/L (ref 135–145)
Sodium: 159 mmol/L — ABNORMAL HIGH (ref 135–145)
Sodium: 158 mmol/L — ABNORMAL HIGH (ref 135–145)

## 2019-09-19 LAB — CBC
HCT: 39.3 % (ref 36.0–46.0)
Hemoglobin: 11.5 g/dL — ABNORMAL LOW (ref 12.0–15.0)
MCH: 27.8 pg (ref 26.0–34.0)
MCHC: 29.3 g/dL — ABNORMAL LOW (ref 30.0–36.0)
MCV: 94.9 fL (ref 80.0–100.0)
Platelets: 639 10*3/uL — ABNORMAL HIGH (ref 150–400)
RBC: 4.14 MIL/uL (ref 3.87–5.11)
RDW: 15.3 % (ref 11.5–15.5)
WBC: 16 10*3/uL — ABNORMAL HIGH (ref 4.0–10.5)
nRBC: 0 % (ref 0.0–0.2)

## 2019-09-19 LAB — PHOSPHORUS
Phosphorus: 3.5 mg/dL (ref 2.5–4.6)
Phosphorus: 3.4 mg/dL (ref 2.5–4.6)

## 2019-09-19 LAB — GLUCOSE, CAPILLARY
Glucose-Capillary: 183 mg/dL — ABNORMAL HIGH (ref 70–99)
Glucose-Capillary: 151 mg/dL — ABNORMAL HIGH (ref 70–99)
Glucose-Capillary: 188 mg/dL — ABNORMAL HIGH (ref 70–99)
Glucose-Capillary: 166 mg/dL — ABNORMAL HIGH (ref 70–99)

## 2019-09-19 LAB — HEMOGLOBIN A1C
Hgb A1c MFr Bld: 6.5 % — ABNORMAL HIGH (ref 4.8–5.6)
Mean Plasma Glucose: 139.85 mg/dL

## 2019-09-19 LAB — MAGNESIUM: Magnesium: 2.8 mg/dL — ABNORMAL HIGH (ref 1.7–2.4)

## 2019-09-19 MED ORDER — FREE WATER
150.0000 mL | Status: DC
  Administered 2019-09-19 – 2019-09-21 (×18): 150 mL

## 2019-09-19 MED ORDER — INSULIN ASPART 100 UNIT/ML ~~LOC~~ SOLN
0.0000 [IU] | SUBCUTANEOUS | Status: DC
  Administered 2019-09-19 – 2019-09-20 (×5): 2 [IU] via SUBCUTANEOUS
  Administered 2019-09-20: 17:00:00 1 [IU] via SUBCUTANEOUS
  Administered 2019-09-20 (×2): 2 [IU] via SUBCUTANEOUS
  Administered 2019-09-21 (×3): 1 [IU] via SUBCUTANEOUS
  Administered 2019-09-21 (×2): 2 [IU] via SUBCUTANEOUS
  Administered 2019-09-22: 1 [IU] via SUBCUTANEOUS
  Administered 2019-09-22 (×3): 2 [IU] via SUBCUTANEOUS
  Administered 2019-09-22: 1 [IU] via SUBCUTANEOUS
  Administered 2019-09-22 (×2): 2 [IU] via SUBCUTANEOUS
  Administered 2019-09-23 (×2): 1 [IU] via SUBCUTANEOUS
  Administered 2019-09-23 (×2): 2 [IU] via SUBCUTANEOUS
  Administered 2019-09-23 – 2019-09-24 (×2): 1 [IU] via SUBCUTANEOUS
  Administered 2019-09-24: 3 [IU] via SUBCUTANEOUS
  Administered 2019-09-24 (×2): 2 [IU] via SUBCUTANEOUS
  Administered 2019-09-24: 1 [IU] via SUBCUTANEOUS
  Administered 2019-09-24 – 2019-09-25 (×3): 2 [IU] via SUBCUTANEOUS
  Administered 2019-09-25: 3 [IU] via SUBCUTANEOUS

## 2019-09-19 MED ORDER — CARVEDILOL 12.5 MG PO TABS: 12.5000 mg | ORAL_TABLET | Freq: Two times a day (BID) | ORAL | Status: DC

## 2019-09-19 MED ORDER — DEXTROSE 5 % IV SOLN
INTRAVENOUS | Status: AC
  Administered 2019-09-19: 22:00:00 via INTRAVENOUS

## 2019-09-19 MED ORDER — IOHEXOL 300 MG/ML  SOLN
50.0000 mL | Freq: Once | INTRAMUSCULAR | Status: AC | PRN
  Administered 2019-09-19: 15:00:00 20 mL

## 2019-09-19 MED ORDER — ASPIRIN 81 MG PO CHEW
81.0000 mg | CHEWABLE_TABLET | Freq: Every day | ORAL | Status: DC
  Administered 2019-09-20 – 2019-09-24 (×5): 81 mg
  Filled 2019-09-19 (×5): qty 1

## 2019-09-19 MED ORDER — METHYLPREDNISOLONE SODIUM SUCC 40 MG IJ SOLR
40.0000 mg | Freq: Every day | INTRAMUSCULAR | Status: AC
  Administered 2019-09-20 – 2019-09-22 (×3): 40 mg via INTRAVENOUS
  Filled 2019-09-19 (×4): qty 1

## 2019-09-19 MED ORDER — ACETAMINOPHEN 160 MG/5ML PO SOLN
650.0000 mg | Freq: Four times a day (QID) | ORAL | Status: DC | PRN
  Administered 2019-09-21: 650 mg
  Filled 2019-09-19: qty 20.3

## 2019-09-19 MED ORDER — ACETAMINOPHEN 650 MG RE SUPP: 650.0000 mg | Freq: Four times a day (QID) | RECTAL | Status: DC | PRN

## 2019-09-19 MED ORDER — OSMOLITE 1.2 CAL PO LIQD
1000.0000 mL | ORAL | Status: DC
  Administered 2019-09-19 – 2019-09-25 (×7): 1000 mL
  Filled 2019-09-19 (×9): qty 1000

## 2019-09-19 MED ORDER — SERTRALINE HCL 50 MG PO TABS
50.0000 mg | ORAL_TABLET | Freq: Every day | ORAL | Status: DC
  Administered 2019-09-20 – 2019-09-24 (×5): 50 mg
  Filled 2019-09-19 (×5): qty 1

## 2019-09-19 MED ORDER — SENNOSIDES-DOCUSATE SODIUM 8.6-50 MG PO TABS
2.0000 | ORAL_TABLET | Freq: Two times a day (BID) | ORAL | Status: DC
  Administered 2019-09-19 – 2019-09-22 (×6): 2
  Filled 2019-09-19 (×6): qty 2

## 2019-09-19 MED ORDER — CARVEDILOL 12.5 MG PO TABS
12.5000 mg | ORAL_TABLET | Freq: Two times a day (BID) | ORAL | Status: DC
  Administered 2019-09-20: 12.5 mg
  Filled 2019-09-19: qty 1

## 2019-09-19 MED ORDER — VITAMIN D 25 MCG (1000 UNIT) PO TABS
1000.0000 [IU] | ORAL_TABLET | Freq: Every day | ORAL | Status: DC
  Administered 2019-09-20 – 2019-09-24 (×5): 1000 [IU]
  Filled 2019-09-19 (×5): qty 1

## 2019-09-19 MED ORDER — PRO-STAT SUGAR FREE PO LIQD
30.0000 mL | Freq: Every day | ORAL | Status: DC
  Administered 2019-09-19 – 2019-09-25 (×7): 30 mL
  Filled 2019-09-19 (×7): qty 30

## 2019-09-19 MED ORDER — ANASTROZOLE 1 MG PO TABS
0.5000 mg | ORAL_TABLET | Freq: Every day | ORAL | Status: DC
  Administered 2019-09-20 – 2019-09-24 (×5): 0.5 mg
  Filled 2019-09-19 (×6): qty 1

## 2019-09-19 MED ORDER — DEXTROSE 5 % IV SOLN
INTRAVENOUS | Status: AC
  Administered 2019-09-19: 05:00:00 via INTRAVENOUS

## 2019-09-19 MED ORDER — LIDOCAINE VISCOUS HCL 2 % MT SOLN
15.0000 mL | Freq: Once | OROMUCOSAL | Status: AC
  Administered 2019-09-19: 3 mL via OROMUCOSAL

## 2019-09-19 NOTE — Consult Note (Signed)
Consultation Note Date: 09/19/2019   Patient Name: Charlotte [name]  DOB: Jul 02, 1928  MRN: [id number]  Age / Sex: 85 y.o., female  PCP: Rosita Fire, MD Referring Physician: Garner Nash, DO  Reason for Consultation: Establishing goals of care and Psychosocial/spiritual support  HPI/Patient Profile: 85 y.o. female   admitted on 08/29/2019 with  with history of sleep apnea on CPAP, breast cancer/Status post bilateral lumpectomy on Arimidex, hypertension, pulmonary hypertension, depression came to hospital with complaints of shortness of breath.   Admitted for treatment and stabilization for a community-acquired pneumonia  Placed on azith and rocephin. COVID neg. initially supported on supplemental oxygen unfortunately the patient decompensated and was placed on BiPAP.  Currently she is stable on nasal cannula.  She remains weak unable to support herself nutritionally and a Corttrac will be placed today for nutritional support.  She is high risk for decompensation  Patient and family face treatment option decisions, advanced directive decisions and anticipatory care needs.   Clinical Assessment and Goals of Care:  This NP Wadie Lessen reviewed medical records, received report from team, assessed the patient and then meet at the patient's bedside along with her husband and son to discuss diagnosis, prognosis, GOC, EOL wishes disposition and options.  Concept of  Palliative Care was discussed  A  discussion was had today regarding advanced directives.  Concepts specific to code status, artifical feeding and hydration, continued IV antibiotics and rehospitalization was had.  The difference between a aggressive medical intervention path  and a palliative comfort care path for this patient at this time was had.  Values and goals of care important to patient and family were attempted to be elicited.  MOST form  introduced and Hard choices booklet left for review.     Questions and concerns addressed.   Family encouraged to call with questions or concerns.    PMT will continue to support holistically.    No documented healthcare power of attorney.  Her husband is her main decision maker with the support of their children's input.     SUMMARY OF RECOMMENDATIONS    Code Status/Advance Care Planning:  Limited code   Encouraged family to consider full DNR/DNI knowing poor outcomes in similar patients  Open to core track for temporary nutritional support, stressed the importance of readdressing artificial feeding dependent on the patient outcomes over the next few days.   Palliative Prophylaxis:   Aspiration, Bowel Regimen, Delirium Protocol, Frequent Pain Assessment and Oral Care  Additional Recommendations (Limitations, Scope, Preferences):  Full Scope Treatment  Psycho-social/Spiritual:   Desire for further Chaplaincy support:yes    Additional Recommendations: Created space and opportunity for family to explore their thoughts and feelings regarding current medical situation.  Family at bedside understand the seriousness of the current medical situation secondary to her advanced age and current decompensated state.  Her husband "I hate to see her like this".  Husband shares his love and appreciation of his wife who worked as a Biochemist, clinical as a Designer, jewellery.  She continues to love to watch basketball on TV.      Prognosis:   Unable to determine-high risk for decompensation.  Prognosis will be dependent on decision for life prolonging measures  Discharge Planning: To Be Determined      Primary Diagnoses: Present on Admission: . Pneumonia . Essential hypertension   I have reviewed the medical record, interviewed the patient and family, and examined the patient. The following aspects are pertinent.  Past Medical History:  Diagnosis Date  . Anxiety   . Arthritis   .  Back pain   . Cancer ([place])    breast cancer x 2  . CHF (congestive heart failure) ([place])   . Depression   . Fatigue   . History of hyperthyroidism    resolved, no medicaions for treatment at this time  . Hypertension   . Insomnia   . OSA (obstructive sleep apnea)   . Personal history of radiation therapy 2017  . Pulmonary hypertension ([place])    Social History   Socioeconomic History  . Marital status: Married    Spouse name: Not on file  . Number of children: Not on file  . Years of education: Not on file  . Highest education level: Not on file  Occupational History  . Not on file  Social Needs  . Financial resource strain: Not on file  . Food insecurity    Worry: Not on file    Inability: Not on file  . Transportation needs    Medical: Not on file    Non-medical: Not on file  Tobacco Use  . Smoking status: Passive Smoke Exposure - Never Smoker  . Smokeless tobacco: Never Used  . Tobacco comment: Husband smoked  Substance and Sexual Activity  . Alcohol use: No    Alcohol/week: 0.0 standard drinks  . Drug use: No  . Sexual activity: Not Currently  Lifestyle  . Physical activity    Days per week: Not on file    Minutes per session: Not on file  . Stress: Not on file  Relationships  . Social Herbalist on phone: Not on file    Gets together: Not on file    Attends religious service: Not on file    Active member of club or organization: Not on file    Attends meetings of clubs or organizations: Not on file    Relationship status: Not on file  Other Topics Concern  . 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 Originally from [place]. Lived for years in [place]. Moved back to [place] in 1991. Has also lived in [place]. No pets currently. No bird or mold exposure.   Family History  Problem Relation Age of Onset  .  Heart attack Mother   . Cancer Mother   . Heart disease Mother   . Hypertension Other   . Cancer Sister   . Heart disease Maternal Grandmother   . Lung disease Neg Hx   . Rheumatologic disease Neg Hx    Scheduled Meds: . anastrozole  0.5 mg Oral Daily  . aspirin EC  81 mg Oral Daily  . carvedilol  12.5 mg Oral BID WC  . chlorhexidine  15 mL Mouth Rinse BID  . Chlorhexidine Gluconate Cloth  6 each Topical Daily  . cholecalciferol  1,000 Units Oral Daily  . cycloSPORINE  1  drop Both Eyes BID  . feeding supplement (ENSURE ENLIVE)  237 mL Oral TID BM  . felodipine  10 mg Oral Daily  . heparin injection (subcutaneous)  5,000 Units Subcutaneous Q8H  . influenza vaccine adjuvanted  0.5 mL Intramuscular Tomorrow-1000  . insulin aspart  0-9 Units Subcutaneous Q4H  . ipratropium-albuterol  3 mL Nebulization QID  . mouth rinse  15 mL Mouth Rinse q12n4p  . [START ON 09/20/2019] methylPREDNISolone (SOLU-MEDROL) injection  40 mg Intravenous Daily  . polyvinyl alcohol  1 drop Both Eyes BID  . senna-docusate  2 tablet Oral BID  . sertraline  50 mg Oral Daily   Continuous Infusions: . dextrose 75 mL/hr at 09/19/19 0900   PRN Meds:.acetaminophen **OR** acetaminophen, carbamide peroxide, Glycerin (Adult), ipratropium-albuterol, labetalol, [DISCONTINUED] ondansetron **OR** ondansetron (ZOFRAN) IV, white petrolatum Medications Prior to Admission:  Prior to Admission medications   Medication Sig Start Date End Date Taking? Authorizing Provider  anastrozole (ARIMIDEX) 1 MG tablet Take 0.5 tablets (0.5 mg total) by mouth daily. 09/29/18  Yes Nicholas Lose, MD  BELSOMRA 5 MG TABS Take 1 tablet by mouth daily. 09/02/17  Yes [provider]  Calcium Carbonate-Vitamin D (CALCIUM 600+D3 PO) Take 1 tablet by mouth 2 (two) times daily.   Yes [provider]  carbamide peroxide (DEBROX) 6.5 % otic solution Place 5 drops into both ears daily as needed (ear wax buildup).    Yes [provider]  cycloSPORINE (RESTASIS) 0.05 % ophthalmic emulsion Place 1 drop into both eyes 2 (two) times daily.   Yes [provider]  felodipine (PLENDIL) 10 MG 24 hr tablet Take 1 tablet (10 mg total) by mouth daily. 10/06/17  Yes BranchAlphonse Guild, MD  fluticasone (FLONASE) 50 MCG/ACT nasal spray Place 2 sprays into the nose daily as needed for allergies.   Yes [provider]  hydrALAZINE (APRESOLINE) 25 MG tablet Take 1 tablet (25 mg total) by mouth 3 (three) times daily. 10/06/17 09/14/2019 Yes BranchAlphonse Guild, MD  losartan (COZAAR) 100 MG tablet Take 100 mg by mouth daily.   Yes [provider]  Omega-3 Fatty Acids (FISH OIL TRIPLE STRENGTH) 1400 MG CAPS Take 1 tablet by mouth daily.   Yes [provider]  Polyethyl Glycol-Propyl Glycol (SYSTANE) 0.4-0.3 % SOLN Apply 1 drop to eye 2 (two) times daily.   Yes [provider]  potassium chloride SA (K-DUR,KLOR-CON) 20 MEQ tablet TAKE (1) TABLET BY MOUTH DAILY. 10/28/18  Yes BranchAlphonse Guild, MD  sertraline (ZOLOFT) 50 MG tablet Take 50 mg by mouth daily.   Yes [provider]  spironolactone (ALDACTONE) 25 MG tablet TAKE 1/2 TABLET BY MOUTH ONCE DAILY. 02/09/19  Yes Arnoldo Lenis, MD  aspirin EC 81 MG tablet Take 81 mg by mouth daily.    [provider]   Allergies  Allergen Reactions  . Latex Swelling    SEVERITY OF SWELLING NOT DEFINED.  Marland Kitchen Bextra [Valdecoxib] Other (See Comments)    Stomach Pains  . Motrin [Ibuprofen] Other (See Comments)    Stomach pain   Review of Systems  Unable to perform ROS: Acuity of condition    Physical Exam Constitutional:      Appearance: She is underweight.     Interventions: Nasal cannula in place.  Cardiovascular:     Rate and Rhythm: Normal rate and regular rhythm.  Pulmonary:     Breath sounds: Decreased breath sounds present.  Musculoskeletal:     Comments: Generalized weakness  and muscle atrophy  Skin:    General:  Skin is warm and dry.  Neurological:     Mental Status: She is lethargic.     Vital Signs: BP (!) 151/69   Pulse 95   Temp 98.1 F (36.7 C) (Axillary)   Resp (!) 24   Ht 5\' 3"  (1.6 m)   Wt 61.2 kg   SpO2 94%   BMI 23.91 kg/m  Pain Scale: 0-10   Pain Score: Asleep   SpO2: SpO2: 94 % O2 Device:SpO2: 94 % O2 Flow Rate: .O2 Flow Rate (L/min): 30 L/min  IO: Intake/output summary:   Intake/Output Summary (Last 24 hours) at 09/19/2019 0933 Last data filed at 09/19/2019 0900 Gross per 24 hour  Intake 680.98 ml  Output 500 ml  Net 180.98 ml    LBM: Last BM Date: 09/10/19 Baseline Weight: Weight: 61.2 kg Most recent weight: Weight: 61.2 kg     Palliative Assessment/Data: 30 % at best   Discussed with Dr Valeta Harms and bedside RN/Robin  PMT will f/u in the morning  Time In: 1300 Time Out: 1415 Time Total: 75 minutes Greater than 50%  of this time was spent counseling and coordinating care related to the above assessment and plan.  Signed by: Wadie Lessen, NP   Please contact Palliative Medicine Team phone at [phone] for questions and concerns.  For individual provider: See Shea Evans

## 2019-09-19 NOTE — Progress Notes (Signed)
eLink Physician-Brief Progress Note Patient Name: Charlotte [name] DOB: Jul 22, 1928 MRN: [id number]   Date of Service  09/19/2019  HPI/Events of Note  Notified of Na 161, was 152 two days ago. ON D5 1/2 NS 25 cc/hr started yesterday. Unable to give water flushes as on BiPap.  eICU Interventions  Shift IVF to D5 W at 50 cc/hr Recheck BMP in 4 hours May need to discontinue chlorthalidone if patient appears fluid deplete on bedside assessment     Intervention Category Major Interventions: Electrolyte abnormality - evaluation and management  Judd Lien 09/19/2019, 5:08 AM

## 2019-09-19 NOTE — Progress Notes (Addendum)
NAME:  Charlotte [name], MRN:  [id number], DOB:  March 31, 1928, LOS: 5 ADMISSION DATE:  08/31/2019, CONSULTATION DATE:  9/23 REFERRING MD:  Andres Labrum, CHIEF COMPLAINT:  Acute respiratory failure   Brief History   85 year old female admitted 9/19 w/ working dx of CAP/atypical PNA. Placed on azith and rocephin. COVID neg. Supported on supplemental oxgyen, given IV lasix and in spite of therapy continued to decline to point she required 100% FI02 on BIPAP w/ rapid desaturation to 70s when bipap seal broken. PCCM asked to see 9/23  History of present illness   This is a pleasant 85 year old female patient who was admitted on 9/19 with chief complaint of worsening shortness of breath, left lateral/lower back/chest discomfort, non-productive cough and possible fever. Denied HA, sore throat, sick exposure, loss of taste or smell. No sig swelling no orthopnea not waking up short of breath.  In ED CT w/ bilateral R>L multilobar GG airspace disease chronic contraction BTX left anterior chest and small L>R effusions. BNP was neg, inflammatory markers including CRP, LDH and ferritin all elevated. COVID negative.    Hospital course  9/19 Admitted w/ working dx of atypical PNA, placed on azith and rocephin and provided supplemental oxygen. Placed on respiratory isolation until repeat COVID returned 9/20 thru 9/23 progressive hypoxia. Repeat COVID neg. Diuresed 9/22 w/ concern that may be component of pulmonary edema. Later placed on BIPAP that evening when saturations could not be maintained above 90% even on heated high flow. PCCM asked to see the am 9/23 for progressive respiratory failure  9/26: Slowly improving  9/28: placed back on BiPAP overnight for tachypnea.   Significant Hospital Events   9/23 -24-bipap 9/28 on bipap from heated high flow   Consults:  9/24 CCM  Procedures:    Significant Diagnostic Tests:   CT chest 9/19: 1. There is no evidence for pulmonary embolus. 2. There are areas of  ground-glass opacification primarily within the right upper lobe, right middle lobe and bilateral lower lobes. These areas demonstrate interlobular septal thickening. These findings are nonspecific and may be related to pulmonary edema, acute interstitial pneumonia, drug toxicity, versus less likely diffuse alveolar hemorrhage. 3. Trace bilateral pleural effusions, left greater than right. 4. Redemonstration of scarring and traction bronchiectasis within the lingula presumably related to the patient's reported history of breast radiation.  ECHO 9/23: LVEF 123456, some diastolic dysfunction, RVSP 59.7  Serologies 9/23: ANA with reflex:  ANA, IFA: neg ANCA titer:neg Anti--DNA antibody, double-stranded: neg  Anti-- J01 antibody, IgG neg Anti--scleredema antibody: neg Cyclic citrulline peptide antibody, IgG- IgA: MPO/PR-3 antibodies: Sed rate: + 112 Rheumatoid factor: +30 Sjogren's syndrome-a: Sjogren's syndrome-:B:   CXR 9/28: Cardiomegaly. Interstitial opacities.   Micro Data:  COVID -19 9/20: neg  Urine strep antigen 9/23: negative Urine Legionella antigen 9/23: neg Respiratory viral panel on 9/23: negative  Antimicrobials:  Rocephin 9/19:-> Azithromycin 9/19->9/24  Interim history/subjective:  Remains on bipap this morning   Objective   Blood pressure (!) 145/64, pulse 78, temperature 98.5 F (36.9 C), temperature source Axillary, resp. rate (!) 28, height 5\' 3"  (1.6 m), weight 61.2 kg, SpO2 98 %.    FiO2 (%):  [90 %-100 %] 100 %   Intake/Output Summary (Last 24 hours) at 09/14/2019 0753 Last data filed at 09/14/2019 0600 Gross per 24 hour  Intake 1270.65 ml  Output 550 ml  Net 720.65 ml   Filed Weights   09/13/2019 1625  Weight: 61.2 kg    Examination: General: WD frail  appearing elderly female, reclined in bed on BiPAP   HENT: NCAT. Pink pad across bridge of nose under BiPAP. BiPAP with good seal and in appropriate position. Trachea midline. Anicteric  sclera Lungs: RUL RML rhonchi. Symmetrical chest expansion, no accessory muscle use  Cardiovascular: RRR s1s2 no rgm  Abdomen: soft, flat, ndnt. normactive x4 Extremities: Symmetrical bulk and tone. No obvious joint deformity. No cyanosis, no clubbing  Neuro: Drowsy. Awakens spontaneously. Generalized weakness, follows commands.    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  -Pulm edema possibly contributing to small extent; BNP low & echocardiogram with RVSP in 50's but normal LVEF and some diastolic dysfunction.  -Autoimmune related pneumonitis -serology all negative -there are case reports of people developing autoimmune processes on Arimidex long-term. -Potentially a viral or drug-related pneumonitis.  Presenting, behaving and recovering like an acute ILD or exacerbation of underlying disease.  Plan -Wean respiratory support as able. -Currently requiring BiPAP, goal is to wean to HFNC. -True goals of care still need to be established-- at present patient endorses desire for short term intubation, however this would likely yield unfavorable outcome for patient.  -Palliative care has been consulted.   -SpO2 goal > 88% -Mobilize as able; up to chair -PT/OT -Solumedrol 40mg  qD (weaned from 40mg  BID to qD 9/29)  HTN -Not able to take POs while on BiPAP  Plan -Carvedilol 12.5 mg BID, chlorthalidone 25 mg -- (held due to NPO) -Labetalol IV PRN q1hr  AKI, improved  Plan -Continue to trend UOP, renal indices  -No further diuresis at this time   Hypernatremia -likely hypovolemic  -Free Water Deficit approx 4L  Plan -D5W at 76ml/hr -Trend BMP -Hold hygroton (home med hygroton 25mg  qD)\ -Will need FWF when dobhoff placed.    Depression Plan -Home SSRI held at present for NPO  Debilitation  Inadequate PO intake Plan -Cortrak consult -If cortrak placed, EN per RDN -PT/OT, mobilize as able   Hyperglycemia -recently started on D5 for hypernatremia  -On solumedrol  Plan -SSI q4  Best practice:  Diet: NPO on BiPAP Pain/Anxiety/Delirium protocol (if indicated): Not applicable VAP protocol (if indicated): Not applicable DVT prophylaxis: Lamar heparin  GI prophylaxis: NA Glucose control: SSI Mobility: BR Code Status: Limited Code: DNR if sustains cardiac or respiratory arrest. However, if respiratory status declines, she would like short term mechanical vent support  Family Communication: Pending 9/29 Disposition: ICU   CRITICAL CARE Performed by: Cristal Generous  Total critical care time: 31 minutes  Critical care time was exclusive of separately billable procedures and treating other patients. Critical care was necessary to treat or prevent imminent or life-threatening deterioration. Critical care was time spent personally by me on the following activities: development of treatment plan with patient and/or surrogate as well as nursing, discussions with consultants, evaluation of patient's response to treatment, examination of patient, obtaining history from patient or surrogate, ordering and performing treatments and interventions, ordering and review of laboratory studies, ordering and review of radiographic studies, pulse oximetry and re-evaluation of patient's condition.  Eliseo Gum MSN, AGACNP-BC [place] [id number] If no answer, [id number] 09/19/2019, 7:46 AM    PCCM attending:  85 year old female admitted for respiratory failure.  Bilateral groundglass opacities concerning for acute ILD, acute pneumonitis.  History  of pulmonary fibrosis status post breast cancer radiation.  Slowly improving on heated high flow nasal cannula.  For enteral access.  Will  attempt today for placement of core tract tube.  Subsequently establish some form of nutrition.  BP 130/65    Pulse 81    Temp (!) 97.4 F (36.3 C) (Oral)    Resp (!) 21    Ht 5\' 3"  (1.6 m)    Wt 61.2 kg    SpO2 95%    BMI 23.91 kg/m   General: Chronically ill-appearing female.  Frail. HEENT: NCAT, sclera clear pupils reactive Lungs: Rhonchi within the right chest greater than the left. Cardiac: Regular rate and rhythm, S1-S2 Abdomen: Soft nontender nondistended Neuro: Awake alert following commands no focal deficit moves all 4 extremities spontaneously. Labs: Reviewed Chest x-ray: Still with diffuse bilateral interstitial infiltrates.  Assessment: Acute hypoxemic respiratory failure History of radiation-induced fibrosis. Still requiring as needed NIPPV.  Plan: Remains in the ICU for close hemodynamic respiratory support. Continue to observe closely in the ICU Encourage early mobility. Up in chair on heated high flow today would be a great goal. Core track placement plan today. Increase D5 half-normal saline to help replace free water deficit.  This patient is critically ill with multiple organ system failure; which, requires frequent high complexity decision making, assessment, support, evaluation, and titration of therapies. This was completed through the application of advanced monitoring technologies and extensive interpretation of multiple databases. During this encounter critical care time was devoted to patient care services described in this note for 32 minutes.   Garner Nash, DO [facility] 09/19/2019 1:28 PM  Personal pager: [phone] If unanswered, please page CCM On-call: [phone]

## 2019-09-19 NOTE — Progress Notes (Signed)
Initial Nutrition Assessment  DOCUMENTATION CODES:   Severe malnutrition in context of acute illness/injury(likely superimposed on chronic malnutrition)  INTERVENTION:   Tube Feeding:  Osmolite 1.2 at 50 ml/hr Begin a 20 ml/hr; titrate by 10 mL q 8 hours until goal rate of 50 ml/hr Pro-Stat 30 mL daily Provides 82 g of protein, 1540 kcals, 972 mL of free water Meets 100% estimated calorie and protein needs  Monitor magnesium, potassium, and phosphorus for at least 5 occurences, MD to replete as needed, as pt is at risk for refeeding syndrome given prolonged inadequate nutrition, malnutrition present.  No BM since 9/20; recommend increasing bowel regimen, checking for impaction   NUTRITION DIAGNOSIS:   Severe Malnutrition related to acute illness as evidenced by energy intake < or equal to 50% for > or equal to 5 days, severe muscle depletion, moderate fat depletion.  GOAL:   Patient will meet greater than or equal to 90% of their needs  MONITOR:   TF tolerance, Diet advancement, PO intake, Labs, Weight trends, Skin  REASON FOR ASSESSMENT:   LOS    ASSESSMENT:   85 yo female admitted 9/19 with progressive respiratory failure with atypical pneumonia ultimately requiring transfer to ICU and BiPap.  Per MD notes, lung disease presenting and recovering like ILD. PMH includes HTN, hx of breast cancer   Pt back and forth between BiPap and HFNC. Pt is very weak, deconditioned.    NPO starting 9/23 due to respiratory status. Spoke with MD yesterday and diet advanced to [facility] but pt unable to take much, if anything by mouth. Pt reports she did not eat anything yesterday or today  Pt with inadequate nutrition since admission on 9/19; NPO essentially x 6 days Plan for Cortrak today  Renal function worsening, hypernatremic, started on D5 infusing at 75 ml/hr today  No weight since 9/19; pt indicates she feels she has lost weight but unable to tell RD how much weight or UBW  No BM  since 9/20   Labs: sodium 159 (H), Creatinine 1.02, BUN 63 Meds: Vitamin D, D5 at 75 ml/hr, solumedrol, senna-docusate   NUTRITION - FOCUSED PHYSICAL EXAM:    Most Recent Value  Orbital Region  Moderate depletion  Upper Arm Region  Mild depletion  Thoracic and Lumbar Region  Severe depletion  Buccal Region  Moderate depletion  Temple Region  Moderate depletion  Clavicle Bone Region  Severe depletion  Clavicle and Acromion Bone Region  Severe depletion  Scapular Bone Region  Severe depletion  Dorsal Hand  Severe depletion  Patellar Region  Severe depletion  Anterior Thigh Region  Severe depletion  Posterior Calf Region  Severe depletion  Edema (RD Assessment)  None       Diet Order:   Diet Order            Diet full liquid Room service appropriate? Yes; Fluid consistency: Thin  Diet effective now              EDUCATION NEEDS:   Not appropriate for education at this time  Skin:  Skin Assessment: Reviewed RN Assessment  Last BM:  9/20  Height:   Ht Readings from Last 1 Encounters:  09/03/2019 5\' 3"  (1.6 m)    Weight:   Wt Readings from Last 1 Encounters:  09/05/2019 61.2 kg    Ideal Body Weight:  52.2 kg  BMI:  Body mass index is 23.91 kg/m.  Estimated Nutritional Needs:   Kcal:  1500-1700 kcals  Protein:  75-85  g  Fluid:  >/= 1.5 L   [employer] MS, RDN, LDN, CNSC [phone] Pager  [phone] Weekend/On-Call Pager

## 2019-09-19 NOTE — Progress Notes (Signed)
Cortrak Tube Team Note:  Consult received to place a Cortrak feeding tube.   Attempt made to place Cortrak tube. RD unable to advance cortrak tube past LES into the stomach. Pt with several episodes of desaturation during procedure. Recommend Fluoroscopy guided nasogastric tube placement.   Koleen Distance MS, RD, LDN Pager #- [phone] Office#- [phone] After Hours Pager: [phone]

## 2019-09-19 NOTE — Progress Notes (Signed)
Cortrak Tube Team Note:  Consult received to place nasal bridle after fluoroscopy guided tube placement. Nasal bridle successfully placed and tube secured at 100cm.    Koleen Distance MS, RD, LDN Pager #- [phone] Office#- [phone] After Hours Pager: [phone]

## 2019-09-19 NOTE — Progress Notes (Signed)
CRITICAL VALUE ALERT  Critical Value:  Sodium 161  Date & Time Notied:  09/19/19 0459  Provider Notified: Antonietta Jewel  Orders Received/Actions taken: New orders received.

## 2019-09-20 DIAGNOSIS — E43 Unspecified severe protein-calorie malnutrition: Secondary | ICD-10-CM | POA: Insufficient documentation

## 2019-09-20 DIAGNOSIS — Z7189 Other specified counseling: Secondary | ICD-10-CM

## 2019-09-20 DIAGNOSIS — E870 Hyperosmolality and hypernatremia: Secondary | ICD-10-CM

## 2019-09-20 DIAGNOSIS — N179 Acute kidney failure, unspecified: Secondary | ICD-10-CM

## 2019-09-20 LAB — BASIC METABOLIC PANEL
Anion gap: 14 (ref 5–15)
BUN: 72 mg/dL — ABNORMAL HIGH (ref 8–23)
CO2: 25 mmol/L (ref 22–32)
Calcium: 9.1 mg/dL (ref 8.9–10.3)
Chloride: 110 mmol/L (ref 98–111)
Creatinine, Ser: 1 mg/dL (ref 0.44–1.00)
GFR calc Af Amer: 58 mL/min — ABNORMAL LOW (ref >60)
GFR calc non Af Amer: 50 mL/min — ABNORMAL LOW (ref >60)
Glucose, Bld: 167 mg/dL — ABNORMAL HIGH (ref 70–99)
Potassium: 4.4 mmol/L (ref 3.5–5.1)
Sodium: 149 mmol/L — ABNORMAL HIGH (ref 135–145)

## 2019-09-20 LAB — GLUCOSE, CAPILLARY
Glucose-Capillary: 87 mg/dL (ref 70–99)
Glucose-Capillary: 155 mg/dL — ABNORMAL HIGH (ref 70–99)
Glucose-Capillary: 167 mg/dL — ABNORMAL HIGH (ref 70–99)
Glucose-Capillary: 136 mg/dL — ABNORMAL HIGH (ref 70–99)
Glucose-Capillary: 176 mg/dL — ABNORMAL HIGH (ref 70–99)

## 2019-09-20 LAB — PHOSPHORUS
Phosphorus: 3.1 mg/dL (ref 2.5–4.6)
Phosphorus: 3.2 mg/dL (ref 2.5–4.6)

## 2019-09-20 LAB — MAGNESIUM
Magnesium: 2.6 mg/dL — ABNORMAL HIGH (ref 1.7–2.4)
Magnesium: 2.6 mg/dL — ABNORMAL HIGH (ref 1.7–2.4)

## 2019-09-20 LAB — CBC
HCT: 33.6 % — ABNORMAL LOW (ref 36.0–46.0)
Hemoglobin: 10.3 g/dL — ABNORMAL LOW (ref 12.0–15.0)
MCH: 28.9 pg (ref 26.0–34.0)
MCHC: 30.7 g/dL (ref 30.0–36.0)
MCV: 94.1 fL (ref 80.0–100.0)
Platelets: 519 10*3/uL — ABNORMAL HIGH (ref 150–400)
RBC: 3.57 MIL/uL — ABNORMAL LOW (ref 3.87–5.11)
RDW: 15.4 % (ref 11.5–15.5)
WBC: 17.2 10*3/uL — ABNORMAL HIGH (ref 4.0–10.5)
nRBC: 0 % (ref 0.0–0.2)

## 2019-09-20 NOTE — Progress Notes (Addendum)
NAME:  Charlotte [name], MRN:  [id number], DOB:  [birth year], LOS: 5 ADMISSION DATE:  09/18/2019, CONSULTATION DATE:  9/23 REFERRING MD:  Andres Labrum, CHIEF COMPLAINT:  Acute respiratory failure   Brief History   85 year old female admitted 9/19 w/ working dx of CAP/atypical PNA. Placed on azith and rocephin. COVID neg. Supported on supplemental oxgyen, given IV lasix and in spite of therapy continued to decline to point she required 100% FI02 on BIPAP w/ rapid desaturation to 70s when bipap seal broken. PCCM asked to see 9/23  History of present illness   This is a pleasant 85 year old female patient who was admitted on 9/19 with chief complaint of worsening shortness of breath, left lateral/lower back/chest discomfort, non-productive cough and possible fever. Denied HA, sore throat, sick exposure, loss of taste or smell. No sig swelling no orthopnea not waking up short of breath.  In ED CT w/ bilateral R>L multilobar GG airspace disease chronic contraction BTX left anterior chest and small L>R effusions. BNP was neg, inflammatory markers including CRP, LDH and ferritin all elevated. COVID negative.    Hospital course  9/19 Admitted w/ working dx of atypical PNA, placed on azith and rocephin and provided supplemental oxygen. Placed on respiratory isolation until repeat COVID returned 9/20 thru 9/23 progressive hypoxia. Repeat COVID neg. Diuresed 9/22 w/ concern that may be component of pulmonary edema. Later placed on BIPAP that evening when saturations could not be maintained above 90% even on heated high flow. PCCM asked to see the am 9/23 for progressive respiratory failure  9/26: Slowly improving  9/28: placed back on BiPAP overnight for tachypnea.   Significant Hospital Events   9/23 -24-bipap 9/28 on bipap from heated high flow   Consults:  9/24 CCM  Procedures:    Significant Diagnostic Tests:   CT chest 9/19: 1. There is no evidence for pulmonary embolus. 2. There are areas of  ground-glass opacification primarily within the right upper lobe, right middle lobe and bilateral lower lobes. These areas demonstrate interlobular septal thickening. These findings are nonspecific and may be related to pulmonary edema, acute interstitial pneumonia, drug toxicity, versus less likely diffuse alveolar hemorrhage. 3. Trace bilateral pleural effusions, left greater than right. 4. Redemonstration of scarring and traction bronchiectasis within the lingula presumably related to the patient's reported history of breast radiation.  ECHO 9/23: LVEF 97-41%, some diastolic dysfunction, RVSP 59.7  Serologies 9/23: ANA with reflex:  ANA, IFA: neg ANCA titer:neg Anti--DNA antibody, double-stranded: neg  Anti-- J01 antibody, IgG neg Anti--scleredema antibody: neg Cyclic citrulline peptide antibody, IgG- IgA: MPO/PR-3 antibodies: Sed rate: + 112 Rheumatoid factor: +30 Sjogren's syndrome-a: Sjogren's syndrome-:B:   CXR 9/28: Cardiomegaly. Interstitial opacities.   Micro Data:  COVID -19 9/20: neg  Urine strep antigen 9/23: negative Urine Legionella antigen 9/23: neg Respiratory viral panel on 9/23: negative  Antimicrobials:  Rocephin 9/19:-> Azithromycin 9/19->9/24  Interim history/subjective:  On HFNC, 60% at 40L/min. Comfortable.  Objective   Blood pressure (!) 145/64, pulse 78, temperature 98.5 F (36.9 C), temperature source Axillary, resp. rate (!) 28, height _0  (1.6 m), weight 61.2 kg, SpO2 98 %.    FiO2 (%):  [90 %-100 %] 100 %   Intake/Output Summary (Last 24 hours) at 09/14/2019 0753 Last data filed at 09/14/2019 0600 Gross per 24 hour  Intake 1270.65 ml  Output 550 ml  Net 720.65 ml   Filed Weights   08/28/2019 1625  Weight: 61.2 kg    Examination: General: Elderly female,  frail, chronically ill appearing, resting in bed, in NAD. Neuro: Sleepy but easily arouseable to voice.  No deficits. HEENT: [place]/AT. Sclerae anicteric. On HFNC. Cardiovascular: RRR,  no M/R/G.  Lungs: Respirations even and unlabored.  CTA bilaterally, No W/R/R. Abdomen: BS x 4, soft, NT/ND.  Musculoskeletal: No gross deformities, no edema.  Skin: Intact, warm, no rashes.   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  -Pulm edema possibly contributing to small extent; BNP low & echocardiogram with RVSP in 50's but normal LVEF and some diastolic dysfunction.  -Autoimmune related pneumonitis -serology all negative -there are case reports of people developing autoimmune processes on Arimidex long-term. -Potentially a viral or drug-related pneumonitis.  Presenting, behaving and recovering like an acute ILD or exacerbation of underlying disease.  Plan -Continue HFNC as able for SpO2 > 88% -Continue BiPAP PRN -Intubation only if has significant distress; though, this would likely not actually provide any benefit given her underlying fibrosis. -Mobilize as able; up to chair -PT/OT -Solumedrol 65m qD (weaned from 451mBID to qD 9/29)  HTN -Not able to take POs while on BiPAP  Plan -Hold preadmission felodipine, losartan, spironolactone -Labetalol IV PRN q1hr  AKI, improved  Plan -Continue to trend UOP, renal indices  -No further diuresis at this time   Hypernatremia -likely hypovolemic  -Free Water Deficit approx 4L  Plan -D5W at 7574mr -Continue free water per tube -Trend BMP -Hold hygroton (home med hygroton 76m49m)  Depression Plan -Continue preadmission zoloft  Debilitation  Inadequate PO intake Plan -Continue TF's -PT/OT, mobilize as able   Hyperglycemia - exacerbated by D5W and steroids Plan -SSI q4  Best practice:  Diet: TF's Pain/Anxiety/Delirium protocol (if indicated): Not applicable VAP protocol (if indicated): Not  applicable DVT prophylaxis: [place] heparin  GI prophylaxis: NA Glucose control: SSI Mobility: BR Code Status: Limited Code: DNR if sustains cardiac or respiratory arrest. However, if respiratory status declines, she would like short term mechanical vent support  Family Communication: Palliative care met and updated with husband 9/29 Disposition: ICU  CC time: 35 min.   RahuMontey [facility] Pager: [phone] no answer, [phone] [id number]/2020, 8:26 AM   PCCM:  89 y52r old female admitted for respiratory failure atypical pneumonia pattern, pneumonitis history of pulmonary fibrosis status post radiation to the breast.  Patient is slowly improving.  Weaning steroids.  Low p.o. intake for the past several days developed hypernatremia.  Finally able to establish enteral access with Dobbhoff tube.  Tolerating much better on heated high flow.  Slowly improving.  BP (!) 95/43    Pulse 65    Temp (!) 97.4 F (36.3 C) (Oral)    Resp 19    Ht _0  (1.6 m)    Wt 61.4 kg    SpO2 (!) 85%    BMI 23.98 kg/m   General: Elderly frail female resting in bed appears comfortable today. HEENT: NCAT, sclera clear Heart: Regular rate and rhythm, S1-S2 Lungs: Crackles no wheeze Abdomen: Soft nontender nondistended  Labs: Reviewed  Assessment:  Acute hypoxemic respiratory failure Atypical pneumonia/pneumonitis, no clear etiology defined Possible viral versus drug-induced.  Plan: Continue to observe in intensive care unit for close hemodynamic respiratory support. Slowly weaning patient from the heated high flow  nasal cannula. Today we will attempt to get her up in chair. Work with PT OT as tolerated. Decrease steroids to Solu-Medrol 40 daily   Garner Nash, DO [facility] 09/20/2019 10:20 AM  Personal pager: [phone] If unanswered, please page CCM On-call: [phone]

## 2019-09-20 NOTE — Progress Notes (Signed)
Pt tx to chair from bed, stand and pivot. Able to bear wt on legs, but unable to take a step.

## 2019-09-20 NOTE — Progress Notes (Addendum)
Patient ID: Charlotte [name], female   DOB: 1928/04/11, 85 y.o.   MRN: [id number]  This NP visited patient at the bedside as a follow up to palliative medicine needs and emotional support, and to re-meet with family as scheduled.  Patient is alert, she is tired but oriented to person and place and pleased to see her husband and daughter. She is improving slowly, still requiring significant oxygen needs and is high risk for decompensation.  Family conversation continued regarding current medical situation.  They remain hopeful for improvement but understand the seriousness of the situation and importance of considering decisions if patient decompensates  Strongly encouraged family to consider DNI knowing poor outcomes in similar patients.  Will discuss again with son by telephone at husband request.  Family will continue to assess the situation, they need more time before making any decisions regarding de-escalation of care and pursuing a comfort path.  For now continue current treatment plan and hope for improvement.    This NP will f/u again on Sunday morning   Discussed with family the importance of continued conversation with each other  and the  medical providers regarding overall plan of care and treatment options,  ensuring decisions are within the context of the patients values and GOCs.  PMT will continue to support holistically  Questions and concerns addressed   Discussed with bedside nursing   Total time spent on the unit was 35 minutes  Greater than 50% of the time was spent in counseling and coordination of care  Wadie Lessen NP  Palliative Medicine Team Team Phone # [phone] Pager [phone]

## 2019-09-21 ENCOUNTER — Inpatient Hospital Stay (HOSPITAL_COMMUNITY): Payer: Medicare Other

## 2019-09-21 LAB — POCT I-STAT 7, (LYTES, BLD GAS, ICA,H+H)
Acid-Base Excess: 9 mmol/L — ABNORMAL HIGH (ref 0.0–2.0)
Bicarbonate: 34.4 mmol/L — ABNORMAL HIGH (ref 20.0–28.0)
Calcium, Ion: 1.31 mmol/L (ref 1.15–1.40)
HCT: 28 % — ABNORMAL LOW (ref 36.0–46.0)
Hemoglobin: 9.5 g/dL — ABNORMAL LOW (ref 12.0–15.0)
O2 Saturation: 93 %
Patient temperature: 98.6
Potassium: 3.7 mmol/L (ref 3.5–5.1)
Sodium: 144 mmol/L (ref 135–145)
TCO2: 36 mmol/L — ABNORMAL HIGH (ref 22–32)
pCO2 arterial: 49 mmHg — ABNORMAL HIGH (ref 32.0–48.0)
pH, Arterial: 7.454 — ABNORMAL HIGH (ref 7.350–7.450)
pO2, Arterial: 66 mmHg — ABNORMAL LOW (ref 83.0–108.0)

## 2019-09-21 LAB — BASIC METABOLIC PANEL
Anion gap: 7 (ref 5–15)
BUN: 59 mg/dL — ABNORMAL HIGH (ref 8–23)
CO2: 34 mmol/L — ABNORMAL HIGH (ref 22–32)
Calcium: 9.1 mg/dL (ref 8.9–10.3)
Chloride: 104 mmol/L (ref 98–111)
Creatinine, Ser: 0.8 mg/dL (ref 0.44–1.00)
GFR calc Af Amer: >60 mL/min (ref >60)
GFR calc non Af Amer: >60 mL/min (ref >60)
Glucose, Bld: 173 mg/dL — ABNORMAL HIGH (ref 70–99)
Potassium: 3.7 mmol/L (ref 3.5–5.1)
Sodium: 145 mmol/L (ref 135–145)

## 2019-09-21 LAB — CBC
HCT: 29.4 % — ABNORMAL LOW (ref 36.0–46.0)
Hemoglobin: 9.1 g/dL — ABNORMAL LOW (ref 12.0–15.0)
MCH: 28.5 pg (ref 26.0–34.0)
MCHC: 31 g/dL (ref 30.0–36.0)
MCV: 92.2 fL (ref 80.0–100.0)
Platelets: 462 10*3/uL — ABNORMAL HIGH (ref 150–400)
RBC: 3.19 MIL/uL — ABNORMAL LOW (ref 3.87–5.11)
RDW: 15.4 % (ref 11.5–15.5)
WBC: 15.3 10*3/uL — ABNORMAL HIGH (ref 4.0–10.5)
nRBC: 0 % (ref 0.0–0.2)

## 2019-09-21 LAB — GLUCOSE, CAPILLARY
Glucose-Capillary: 126 mg/dL — ABNORMAL HIGH (ref 70–99)
Glucose-Capillary: 144 mg/dL — ABNORMAL HIGH (ref 70–99)
Glucose-Capillary: 150 mg/dL — ABNORMAL HIGH (ref 70–99)
Glucose-Capillary: 151 mg/dL — ABNORMAL HIGH (ref 70–99)
Glucose-Capillary: 160 mg/dL — ABNORMAL HIGH (ref 70–99)
Glucose-Capillary: 165 mg/dL — ABNORMAL HIGH (ref 70–99)
Glucose-Capillary: 178 mg/dL — ABNORMAL HIGH (ref 70–99)

## 2019-09-21 LAB — PHOSPHORUS: Phosphorus: 2.4 mg/dL — ABNORMAL LOW (ref 2.5–4.6)

## 2019-09-21 LAB — MAGNESIUM: Magnesium: 2.4 mg/dL (ref 1.7–2.4)

## 2019-09-21 MED ORDER — LIDOCAINE 5 % EX PTCH
1.0000 | MEDICATED_PATCH | CUTANEOUS | Status: DC
  Administered 2019-09-21 – 2019-09-24 (×4): 1 via TRANSDERMAL
  Filled 2019-09-21 (×4): qty 1

## 2019-09-21 MED ORDER — FREE WATER
150.0000 mL | Status: AC
  Administered 2019-09-21 – 2019-09-22 (×6): 150 mL

## 2019-09-21 NOTE — Progress Notes (Addendum)
Notified RT pt comments she is "not breathing right", pt asking to be placed on bipap. Pt appears in no distress in high fowlers position with HFNC @ 70% fio2, conts to speak full complete sentences

## 2019-09-21 NOTE — Progress Notes (Signed)
NAME:  Charlotte [name], MRN:  [id number], DOB:  Nov 01, 1928, LOS: 5 ADMISSION DATE:  08/23/2019, CONSULTATION DATE:  9/23 REFERRING MD:  Andres Labrum, CHIEF COMPLAINT:  Acute respiratory failure   Brief History   85 year old female admitted 9/19 w/ working dx of CAP/atypical PNA. Placed on azith and rocephin. COVID neg. Supported on supplemental oxgyen, given IV lasix and in spite of therapy continued to decline to point she required 100% FI02 on BIPAP w/ rapid desaturation to 70s when bipap seal broken. PCCM asked to see 9/23  History of present illness   This is a pleasant 85 year old female patient who was admitted on 9/19 with chief complaint of worsening shortness of breath, left lateral/lower back/chest discomfort, non-productive cough and possible fever. Denied HA, sore throat, sick exposure, loss of taste or smell. No sig swelling no orthopnea not waking up short of breath.  In ED CT w/ bilateral R>L multilobar GG airspace disease chronic contraction BTX left anterior chest and small L>R effusions. BNP was neg, inflammatory markers including CRP, LDH and ferritin all elevated. COVID negative.    Hospital course  9/19 Admitted w/ working dx of atypical PNA, placed on azith and rocephin and provided supplemental oxygen. Placed on respiratory isolation until repeat COVID returned 9/20 thru 9/23 progressive hypoxia. Repeat COVID neg. Diuresed 9/22 w/ concern that may be component of pulmonary edema. Later placed on BIPAP that evening when saturations could not be maintained above 90% even on heated high flow. PCCM asked to see the am 9/23 for progressive respiratory failure  9/26: Slowly improving  9/28: placed back on BiPAP overnight for tachypnea.   Significant Hospital Events   9/23 -24-bipap 9/28 on bipap from heated high flow   Consults:  9/24 CCM  Procedures:    Significant Diagnostic Tests:   CT chest 9/19: 1. There is no evidence for pulmonary embolus. 2. There are areas of  ground-glass opacification primarily within the right upper lobe, right middle lobe and bilateral lower lobes. These areas demonstrate interlobular septal thickening. These findings are nonspecific and may be related to pulmonary edema, acute interstitial pneumonia, drug toxicity, versus less likely diffuse alveolar hemorrhage. 3. Trace bilateral pleural effusions, left greater than right. 4. Redemonstration of scarring and traction bronchiectasis within the lingula presumably related to the patient's reported history of breast radiation.  ECHO 9/23: LVEF 92-42%, some diastolic dysfunction, RVSP 59.7  Serologies 9/23: ANA with reflex:  ANA, IFA: neg ANCA titer:neg Anti--DNA antibody, double-stranded: neg  Anti-- J01 antibody, IgG neg Anti--scleredema antibody: neg Cyclic citrulline peptide antibody, IgG- IgA: MPO/PR-3 antibodies: Sed rate: + 112 Rheumatoid factor: +30 Sjogren's syndrome-a: Sjogren's syndrome-:B:   CXR 9/28: Cardiomegaly. Interstitial opacities.   Micro Data:  COVID -19 9/20: neg  Urine strep antigen 9/23: negative Urine Legionella antigen 9/23: neg Respiratory viral panel on 9/23: negative  Antimicrobials:  Rocephin 9/19:-> Azithromycin 9/19->9/24  Interim history/subjective:  Some respiratory distress overnight, placed on BiPAP.  This AM, back on HFNC, 70% at 40L/min.  Objective   Blood pressure (!) 145/64, pulse 78, temperature 98.5 F (36.9 C), temperature source Axillary, resp. rate (!) 28, height 5' 3"  (1.6 m), weight 61.2 kg, SpO2 98 %.    FiO2 (%):  [90 %-100 %] 100 %   Intake/Output Summary (Last 24 hours) at 09/14/2019 0753 Last data filed at 09/14/2019 0600 Gross per 24 hour  Intake 1270.65 ml  Output 550 ml  Net 720.65 ml   Filed Weights   08/24/2019 1625  Weight: 61.2 kg    Examination: General: Elderly female, frail, chronically ill appearing, resting in bed, in NAD. Neuro: Somnolent but easily arouseable.  No deficits. HEENT: [place]/AT.  Sclerae anicteric. On HFNC. Cardiovascular: RRR, no M/R/G.  Lungs: Respirations even and unlabored.  CTA bilaterally, No W/R/R. Abdomen: BS x 4, soft, NT/ND.  Musculoskeletal: No gross deformities, no edema.  Skin: Intact, warm, no rashes.   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  -Pulm edema possibly contributing to small extent; BNP low & echocardiogram with RVSP in 50's but normal LVEF and some diastolic dysfunction. -Autoimmune related pneumonitis - serology all negative. -there are case reports of people developing autoimmune processes on Arimidex long-term. -Potentially a viral or drug-related pneumonitis.  Presenting, behaving and recovering like an acute ILD or exacerbation of underlying disease.  Plan -Continue HFNC as able for SpO2 > 88% -Continue BiPAP PRN -Intubation only if has significant distress; though, this would likely not actually provide any benefit given her underlying fibrosis. -Push mobilization as able; up to chair -PT/OT -Continue Solumedrol 34m QD (weaned from 436mBID to qD 9/29) -? Role of repeat COVID testing, will discuss with MD  HTN -Not able to take POs while on BiPAP  Plan -Hold preadmission felodipine, losartan, spironolactone -Labetalol IV PRN q1hr  AKI - resolved Plan -Continue to trend UOP, renal indices  -No further diuresis at this time, at neg balance currently  Hypernatremia - improved -likely hypovolemic  Plan -Continue free water per tube -Trend BMP -Hold hygroton (home med hygroton 2562mD)  Depression Plan -Continue preadmission zoloft  Debilitation  Inadequate PO intake Plan -Continue TF's -PT/OT, mobilize as able   Hyperglycemia - exacerbated by D5W and steroids Plan -SSI q4  Best practice:   Diet: TF's Pain/Anxiety/Delirium protocol (if indicated): Not applicable VAP protocol (if indicated): Not applicable DVT prophylaxis: [place] heparin  GI prophylaxis: NA Glucose control: SSI Mobility: BR Code Status: Limited Code: DNR if sustains cardiac or respiratory arrest. However, if respiratory status declines, she would like short term mechanical vent support  Family Communication: Palliative care met and updated with husband 9/29 Disposition: ICU    RahMontey HoraA [facility] Pager: [phone] no answer, [phone] - [id number]/12/2018, 7:45 AM

## 2019-09-21 NOTE — Progress Notes (Signed)
[facility] Progress Note Patient Name: Charlotte [name] DOB: 06-20-1928 MRN: [id number]   Date of Service  09/21/2019  HPI/Events of Note  Notified of patient having chronic left hip pain for which she usually uses a lidocaine patch as per patient  eICU Interventions  Lidocaine patch ordered     Intervention Category Intermediate Interventions: Pain - evaluation and management  Judd Lien 09/21/2019, 9:40 PM

## 2019-09-21 NOTE — Progress Notes (Addendum)
Lowered hob to reposition pt up in bed, pt with dyspnea and rr increase to 30s, speaking full complete sentences. Resumed hob to high fowlers and increased HFNC from 60% to 70% fio2, pt rr improved to 20s and appears less sob. This rn notified RT.

## 2019-09-21 NOTE — Progress Notes (Signed)
eLink Physician-Brief Progress Note Patient Name: Charlotte [name] DOB: [birth year] MRN: [id number]   Date of Service  09/21/2019  HPI/Events of Note  Pt with progressive respiratory difficulty over the course of the evening. Now quite somnolent, may require intubation.  eICU Interventions  Stat ABG, will decide on intubation based on the result.        [place] U Ogan 09/21/2019, 3:01 AM

## 2019-09-21 NOTE — Progress Notes (Signed)
Assisted tele visit to patient with daughter.  Charlotte Cudd Samson, RN  

## 2019-09-21 NOTE — Progress Notes (Addendum)
Pt c/o bipap mask being too tight. Attempt to loosen mask for pt comfort and maintain good seal unsuccessful. Oxygen sats decrease to 50s with transfer of bipap over to HFNC, pt speaking full complete sentences, elink notified and camera visual done by nurse [facility]. Informed [facility] concerns for increase in oxygen demand and  +1000cc past 18hrs.

## 2019-09-21 NOTE — Progress Notes (Signed)
OT Cancellation Note  Patient Details Name: Charlotte [name] MRN: [id number] DOB: 02/26/1928   Cancelled Treatment:    Reason Eval/Treat Not Completed: Medical issues which prohibited therapy(Per RN, pt desats to the 40's with minimal mobility and requires and extended period of time to recover. Will follow.  Malka So 09/21/2019, 10:28 AM  Nestor Lewandowsky, OTR/L Acute Rehabilitation Services Pager: [phone] Office: [phone]

## 2019-09-21 NOTE — Progress Notes (Signed)
PT Cancellation Note  Patient Details Name: Charlotte [name] MRN: [id number] DOB: 09-07-1928   Cancelled Treatment:    Reason Eval/Treat Not Completed: Medical issues which prohibited therapy. Per nsg pt is desaturating significantly with minimal mobility and asked to defer.    [place] 09/21/2019, 10:27 AM [place] Pager [phone] Office [phone]

## 2019-09-21 DEATH — deceased

## 2019-09-22 DIAGNOSIS — R0902 Hypoxemia: Secondary | ICD-10-CM

## 2019-09-22 LAB — CBC
HCT: 28.8 % — ABNORMAL LOW (ref 36.0–46.0)
Hemoglobin: 8.6 g/dL — ABNORMAL LOW (ref 12.0–15.0)
MCH: 28.4 pg (ref 26.0–34.0)
MCHC: 29.9 g/dL — ABNORMAL LOW (ref 30.0–36.0)
MCV: 95 fL (ref 80.0–100.0)
Platelets: 371 10*3/uL (ref 150–400)
RBC: 3.03 MIL/uL — ABNORMAL LOW (ref 3.87–5.11)
RDW: 15.6 % — ABNORMAL HIGH (ref 11.5–15.5)
WBC: 15.7 10*3/uL — ABNORMAL HIGH (ref 4.0–10.5)
nRBC: 0 % (ref 0.0–0.2)

## 2019-09-22 LAB — GLUCOSE, CAPILLARY
Glucose-Capillary: 167 mg/dL — ABNORMAL HIGH (ref 70–99)
Glucose-Capillary: 162 mg/dL — ABNORMAL HIGH (ref 70–99)
Glucose-Capillary: 134 mg/dL — ABNORMAL HIGH (ref 70–99)
Glucose-Capillary: 173 mg/dL — ABNORMAL HIGH (ref 70–99)
Glucose-Capillary: 158 mg/dL — ABNORMAL HIGH (ref 70–99)

## 2019-09-22 LAB — BASIC METABOLIC PANEL
Anion gap: 10 (ref 5–15)
BUN: 53 mg/dL — ABNORMAL HIGH (ref 8–23)
CO2: 33 mmol/L — ABNORMAL HIGH (ref 22–32)
Calcium: 9.4 mg/dL (ref 8.9–10.3)
Chloride: 107 mmol/L (ref 98–111)
Creatinine, Ser: 0.75 mg/dL (ref 0.44–1.00)
GFR calc Af Amer: >60 mL/min (ref >60)
GFR calc non Af Amer: >60 mL/min (ref >60)
Glucose, Bld: 164 mg/dL — ABNORMAL HIGH (ref 70–99)
Potassium: 4.2 mmol/L (ref 3.5–5.1)
Sodium: 150 mmol/L — ABNORMAL HIGH (ref 135–145)

## 2019-09-22 LAB — PHOSPHORUS: Phosphorus: 3 mg/dL (ref 2.5–4.6)

## 2019-09-22 LAB — MAGNESIUM: Magnesium: 2.4 mg/dL (ref 1.7–2.4)

## 2019-09-22 MED ORDER — PREDNISONE 10 MG PO TABS: 10.0000 mg | ORAL_TABLET | Freq: Every day | ORAL | Status: DC

## 2019-09-22 MED ORDER — PREDNISONE 20 MG PO TABS: 20.0000 mg | ORAL_TABLET | Freq: Every day | ORAL | Status: DC

## 2019-09-22 MED ORDER — PREDNISONE 20 MG PO TABS
40.0000 mg | ORAL_TABLET | Freq: Every day | ORAL | Status: DC
  Administered 2019-09-23 – 2019-09-24 (×2): 40 mg
  Filled 2019-09-22 (×2): qty 2

## 2019-09-22 MED ORDER — SENNOSIDES 8.8 MG/5ML PO SYRP
10.0000 mL | ORAL_SOLUTION | Freq: Two times a day (BID) | ORAL | Status: DC
  Administered 2019-09-22 – 2019-09-24 (×5): 10 mL
  Filled 2019-09-22 (×6): qty 10

## 2019-09-22 MED ORDER — PREDNISONE 20 MG PO TABS: 30.0000 mg | ORAL_TABLET | Freq: Every day | ORAL | Status: DC

## 2019-09-22 MED ORDER — FREE WATER
150.0000 mL | Freq: Four times a day (QID) | Status: DC
  Administered 2019-09-22 – 2019-09-23 (×4): 150 mL

## 2019-09-22 NOTE — Progress Notes (Addendum)
Patient had 11 beat run of vtach. Asymptomatic. Notified Elink. Copy placed in paper chart. Will continue to monitor.

## 2019-09-22 NOTE — Progress Notes (Signed)
eLink Physician-Brief Progress Note Patient Name: Charlotte [name] DOB: February 06, 1928 MRN: [id number]   Date of Service  09/22/2019  HPI/Events of Note  Bladder scan with 600 cc. Patient does have Purewick in place.  eICU Interventions  Foley cath ordered     Intervention Category Intermediate Interventions: Other:  Judd Lien 09/22/2019, 4:35 AM

## 2019-09-22 NOTE — Progress Notes (Signed)
NAME:  Charlotte [name], MRN:  [id number], DOB:  12/12/1928, LOS: 5 ADMISSION DATE:  09/18/2019, CONSULTATION DATE:  9/23 REFERRING MD:  Andres Labrum, CHIEF COMPLAINT:  Acute respiratory failure   Brief History   85 year old female admitted 9/19 w/ working dx of CAP/atypical PNA. Placed on azith and rocephin. COVID neg. Supported on supplemental oxgyen, given IV lasix and in spite of therapy continued to decline to point she required 100% FI02 on BIPAP w/ rapid desaturation to 70s when bipap seal broken. PCCM asked to see 9/23  History of present illness   This is a pleasant 85 year old female patient who was admitted on 9/19 with chief complaint of worsening shortness of breath, left lateral/lower back/chest discomfort, non-productive cough and possible fever. Denied HA, sore throat, sick exposure, loss of taste or smell. No sig swelling no orthopnea not waking up short of breath.  In ED CT w/ bilateral R>L multilobar GG airspace disease chronic contraction BTX left anterior chest and small L>R effusions. BNP was neg, inflammatory markers including CRP, LDH and ferritin all elevated. COVID negative.    Hospital course  9/19 Admitted w/ working dx of atypical PNA, placed on azith and rocephin and provided supplemental oxygen. Placed on respiratory isolation until repeat COVID returned 9/20 thru 9/23 progressive hypoxia. Repeat COVID neg. Diuresed 9/22 w/ concern that may be component of pulmonary edema. Later placed on BIPAP that evening when saturations could not be maintained above 90% even on heated high flow. PCCM asked to see the am 9/23 for progressive respiratory failure  9/26: Slowly improving  9/28: placed back on BiPAP overnight for tachypnea.   Significant Hospital Events   9/23 -24-bipap 9/28 on bipap from heated high flow   Consults:  9/24 CCM  Procedures:    Significant Diagnostic Tests:   CT chest 9/19: 1. There is no evidence for pulmonary embolus. 2. There are areas of  ground-glass opacification primarily within the right upper lobe, right middle lobe and bilateral lower lobes. These areas demonstrate interlobular septal thickening. These findings are nonspecific and may be related to pulmonary edema, acute interstitial pneumonia, drug toxicity, versus less likely diffuse alveolar hemorrhage. 3. Trace bilateral pleural effusions, left greater than right. 4. Redemonstration of scarring and traction bronchiectasis within the lingula presumably related to the patient's reported history of breast radiation.  ECHO 9/23: LVEF 16-94%, some diastolic dysfunction, RVSP 59.7  Serologies 9/23: ANA with reflex:  ANA, IFA: neg ANCA titer:neg Anti--DNA antibody, double-stranded: neg  Anti-- J01 antibody, IgG neg Anti--scleredema antibody: neg Cyclic citrulline peptide antibody, IgG- IgA: MPO/PR-3 antibodies: Sed rate: + 112 Rheumatoid factor: +30 Sjogren's syndrome-a: Sjogren's syndrome-:B:   CXR 9/28: Cardiomegaly. Interstitial opacities.   Micro Data:  COVID -19 9/20: neg  Urine strep antigen 9/23: negative Urine Legionella antigen 9/23: neg Respiratory viral panel on 9/23: negative  Antimicrobials:  Rocephin 9/19:-> Azithromycin 9/19->9/24  Interim history/subjective:  No acute events.  Clinically looks much better this AM compared to past 2 days.  Still on 60% at 30L/min though.  Objective   Blood pressure (!) 145/64, pulse 78, temperature 98.5 F (36.9 C), temperature source Axillary, resp. rate (!) 28, height 5' 3"  (1.6 m), weight 61.2 kg, SpO2 98 %.    FiO2 (%):  [90 %-100 %] 100 %   Intake/Output Summary (Last 24 hours) at 09/14/2019 0753 Last data filed at 09/14/2019 0600 Gross per 24 hour  Intake 1270.65 ml  Output 550 ml  Net 720.65 ml   Danley Danker  Weights   09/18/2019 1625  Weight: 61.2 kg    Examination: General: Elderly female, frail, chronically ill appearing, resting in bed, in NAD. Neuro: Much more alert, answers appropriately.   Follows basic commands. HEENT: [place]/AT. Sclerae anicteric. On HFNC. Cardiovascular: RRR, no M/R/G.  Lungs: Respirations even and unlabored.  CTA bilaterally, No W/R/R. Abdomen: BS x 4, soft, NT/ND.  Musculoskeletal: No gross deformities, no edema.  Skin: Intact, warm, no rashes.   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  -Pulm edema possibly contributing to small extent; BNP low & echocardiogram with RVSP in 50's but normal LVEF and some diastolic dysfunction. -Autoimmune related pneumonitis - serology all negative. -there are case reports of people developing autoimmune processes on Arimidex long-term. -Potentially a viral or drug-related pneumonitis.  Presenting, behaving and recovering like an acute ILD or exacerbation of underlying disease.  Plan -Continue HFNC as able for SpO2 > 88%, will be slow to wean -Continue BiPAP PRN -Intubation only if has significant distress; though, this would likely not actually provide any benefit given her underlying fibrosis. -Push mobilization as able; up to chair - encouraged pt to push herself today with PT -PT/OT -Continue steroids, change to prednisone per tube.  Will do taper of 39m x 3 days then 30 then 20 then 10.  Discussed with pharmacy.  HTN -Not able to take POs while on BiPAP  Plan -Hold preadmission felodipine, losartan, spironolactone -Labetalol IV PRN q1hr  AKI - resolved Plan -Continue to trend UOP, renal indices  -No further diuresis at this time, at neg balance currently  Hypernatremia - improved -likely hypovolemic  Plan -Continue free water per tube -Trend BMP -Hold hygroton (home med hygroton 260mqD)  Depression Plan -Continue preadmission zoloft  Debilitation  Inadequate PO intake Plan  -Continue TF's -PT/OT, mobilize as able   Hyperglycemia - exacerbated by D5W and steroids Plan -SSI q4  Best practice:  Diet: TF's Pain/Anxiety/Delirium protocol (if indicated): Not applicable VAP protocol (if indicated): Not applicable DVT prophylaxis:  heparin  GI prophylaxis: NA Glucose control: SSI Mobility: BR Code Status: Limited Code: DNR if sustains cardiac or respiratory arrest. However, if respiratory status declines, she would like short term mechanical vent support  Family Communication: Palliative care met and updated with husband 9/29 Disposition: ICU   RaMontey [facility] Pager: [phone] If no answer, [phone] - [id number]/01/2019, 8:39 AM

## 2019-09-22 NOTE — Progress Notes (Signed)
Video call completed with family. Patient able to communicate with family.

## 2019-09-22 NOTE — Progress Notes (Signed)
Patient ID: ADAUGO KONOPACKI, female   DOB: 05-12-1928, 85 y.o.   MRN: [id number]  This NP spoke to April/bedsdie RN for update.  Patient is still requiring high volume oxygen for support and desaturates on minimal exertion. She continues to tolerate tube feeds, she is weak  Spoke to son by telephone for conversation continued regarding current medical situation.  They remain hopeful for improvement but understand the seriousness of the situation and importance of considering decisions if patient decompensates  Again addressed recommendation for DNI,  knowing poor outcomes in similar patients.  He tells me his father/HPOA remains adamant that he wants patient intubated if she decompensates to that need.  Family will continue to assess the situation, they need more time before making any decisions regarding de-escalation of care and pursuing a comfort path.  For now continue current treatment plan and hope for improvement.    Emotional support offered. PMT will continue to support holistically  Questions and concerns addressed   Discussed with bedside nursing   Total time  15 minutes  Greater than 50% of the time was spent in counseling and coordination of care  Wadie Lessen NP  Palliative Medicine Team Team Phone # [phone] Pager [phone]

## 2019-09-22 NOTE — Progress Notes (Addendum)
CM following at a distance for transition of care needs through chart reviews and progression rounds at this time. PTA home with spouse. Not able to tolerate working with therapy at this time. Noted patient on HFNC, but desats to 50s with minimal exertion. Palliative assisting with GOC and family communication.   Manya Silvas, MSN RN CCM Transitions of Care [phone]

## 2019-09-22 NOTE — Progress Notes (Signed)
Nutrition Follow-up  DOCUMENTATION CODES:   Severe malnutrition in context of acute illness/injury(likely superimposed on chronic malnutrition)  INTERVENTION:   Tube Feeding:  Continue Osmolite 1.2 at 50 ml/hr Continue Pro-Stat 30 mL daily Provides 82 g of protein, 1540 kcals, 972 mL of free water Meets 100% estimated calorie and protein needs  TF plus current free water flushes of 150 mL q 6 hours provides 1572 mL of free water   NUTRITION DIAGNOSIS:   Severe Malnutrition related to acute illness as evidenced by energy intake < or equal to 50% for > or equal to 5 days, severe muscle depletion, moderate fat depletion.  Being addressed via TF   GOAL:   Patient will meet greater than or equal to 90% of their needs  Met  MONITOR:   TF tolerance, Diet advancement, PO intake, Labs, Weight trends, Skin  REASON FOR ASSESSMENT:   LOS    ASSESSMENT:   85 yo female admitted 9/19 with progressive respiratory failure with atypical pneumonia ultimately requiring transfer to ICU and BiPap.  Per MD notes, lung disease presenting and recovering like ILD. PMH includes HTN, hx of breast cancer  9/29 PP feeding tube placed by Radiology, TF started  Pt remains on HFNC, difficult weaning. Requiring BiPap PRN  Palliative care following, pt remains Partial Code  Pt NPO; Tolerating Osmolite 1.2 at 50 ml/hr, Pro-Stat 30 mL daily via cortrak  Pt did not receive PT/OT services yesterday due to pt significantly desaturating with any mobility. Pt O2 sats dipped into 40s yesterday with mobilization to bathroom  Hypernatremic, sodium 150 today, up from 145 yesterday after D5 infusion stopped. Noted free water of 150 mL q 6 hours ordered today by MD  Labs: CBGs 144-178 (ICU goal 140-180), Creatinine wdl, sodium 150 (H), potassium/phosphorus/magnesium wdl Meds: Vitamin D, prednisone, ss novolog   Diet Order:   Diet Order            Diet NPO time specified  Diet effective now              EDUCATION NEEDS:   Not appropriate for education at this time  Skin:  Skin Assessment: Reviewed RN Assessment  Last BM:  10/1  Height:   Ht Readings from Last 1 Encounters:  09/16/2019 5' 3" (1.6 m)    Weight:   Wt Readings from Last 1 Encounters:  09/22/19 61.6 kg    Ideal Body Weight:  52.2 kg  BMI:  Body mass index is 24.06 kg/m.  Estimated Nutritional Needs:   Kcal:  1500-1700 kcals  Protein:  75-85 g  Fluid:  >/= 1.5 L   Cate Karel Mowers MS, RDN, LDN, CNSC [phone] Pager  [phone] Weekend/On-Call Pager

## 2019-09-22 NOTE — Progress Notes (Signed)
PT Cancellation Note  Patient Details Name: Charlotte [name] MRN: [id number] DOB: 1928/07/26   Cancelled Treatment:    Reason Eval/Treat Not Completed: Medical issues which prohibited therapy.  Desats into the 50's with any movement.  RN holds today.  Will see 10/3 as able. 09/22/2019  Donnella Sham, PT Acute Rehabilitation Services [phone]  (pager) [phone]  (office)   [name] 09/22/2019, 11:40 AM

## 2019-09-22 NOTE — Progress Notes (Signed)
OT Cancellation Note  Patient Details Name: AMBREIA HOGANCAMP MRN: [id number] DOB: [birth year]   Cancelled Treatment:    Reason Eval/Treat Not Completed: Patient not medically ready.  Will reattempt as appropriate.  Lucille Passy, OTR/L Runner, broadcasting/film/video [phone] Office [phone] .    Lucille Passy M 09/22/2019, 2:34 PM

## 2019-09-22 NOTE — Progress Notes (Signed)
Low urine output, bladder was scanned for >600cc despite of having a purewick. E-link MD notified. Orders given. I/O was performed with 700 cc out.

## 2019-09-22 NOTE — Progress Notes (Signed)
eLink Physician-Brief Progress Note Patient Name: Charlotte [name] DOB: [birth year] MRN: [id number]   Date of Service  09/22/2019  HPI/Events of Note  Notified of transient episode of tachyarrhythmia. No significant electrolyte abnormality except for Na of 150. BP 108/44, HR currently 82  eICU Interventions  Continue water water at 150 q 6 Refer if tachycardia persistent     Intervention Category Major Interventions: Arrhythmia - evaluation and management  Judd Lien 09/22/2019, 11:09 PM

## 2019-09-22 NOTE — Progress Notes (Signed)
Patient had a 15 second run of tachycardia. Aysmptomatic. Notified Elink Dr. Genevive Bi. Will continue to monitor.

## 2019-09-23 DIAGNOSIS — R5381 Other malaise: Secondary | ICD-10-CM

## 2019-09-23 DIAGNOSIS — J9621 Acute and chronic respiratory failure with hypoxia: Secondary | ICD-10-CM

## 2019-09-23 DIAGNOSIS — J84114 Acute interstitial pneumonitis: Secondary | ICD-10-CM

## 2019-09-23 LAB — MAGNESIUM: Magnesium: 2.5 mg/dL — ABNORMAL HIGH (ref 1.7–2.4)

## 2019-09-23 LAB — BASIC METABOLIC PANEL WITH GFR
Anion gap: 10 (ref 5–15)
BUN: 52 mg/dL — ABNORMAL HIGH (ref 8–23)
CO2: 34 mmol/L — ABNORMAL HIGH (ref 22–32)
Calcium: 9.6 mg/dL (ref 8.9–10.3)
Chloride: 110 mmol/L (ref 98–111)
Creatinine, Ser: 0.74 mg/dL (ref 0.44–1.00)
GFR calc Af Amer: >60 mL/min
GFR calc non Af Amer: >60 mL/min
Glucose, Bld: 149 mg/dL — ABNORMAL HIGH (ref 70–99)
Potassium: 5 mmol/L (ref 3.5–5.1)
Sodium: 154 mmol/L — ABNORMAL HIGH (ref 135–145)

## 2019-09-23 LAB — URINALYSIS, ROUTINE W REFLEX MICROSCOPIC
Bilirubin Urine: NEGATIVE
Glucose, UA: 50 mg/dL — AB
Hgb urine dipstick: NEGATIVE
Ketones, ur: NEGATIVE mg/dL
Leukocytes,Ua: NEGATIVE
Nitrite: NEGATIVE
Protein, ur: NEGATIVE mg/dL
Specific Gravity, Urine: 1.016 (ref 1.005–1.030)
pH: 6 (ref 5.0–8.0)

## 2019-09-23 LAB — GLUCOSE, CAPILLARY
Glucose-Capillary: 135 mg/dL — ABNORMAL HIGH (ref 70–99)
Glucose-Capillary: 143 mg/dL — ABNORMAL HIGH (ref 70–99)
Glucose-Capillary: 145 mg/dL — ABNORMAL HIGH (ref 70–99)
Glucose-Capillary: 149 mg/dL — ABNORMAL HIGH (ref 70–99)
Glucose-Capillary: 150 mg/dL — ABNORMAL HIGH (ref 70–99)
Glucose-Capillary: 189 mg/dL — ABNORMAL HIGH (ref 70–99)
Glucose-Capillary: 200 mg/dL — ABNORMAL HIGH (ref 70–99)

## 2019-09-23 LAB — CBC
HCT: 34.7 % — ABNORMAL LOW (ref 36.0–46.0)
Hemoglobin: 10.2 g/dL — ABNORMAL LOW (ref 12.0–15.0)
MCH: 28.3 pg (ref 26.0–34.0)
MCHC: 29.4 g/dL — ABNORMAL LOW (ref 30.0–36.0)
MCV: 96.4 fL (ref 80.0–100.0)
Platelets: 326 10*3/uL (ref 150–400)
RBC: 3.6 MIL/uL — ABNORMAL LOW (ref 3.87–5.11)
RDW: 15.9 % — ABNORMAL HIGH (ref 11.5–15.5)
WBC: 14 10*3/uL — ABNORMAL HIGH (ref 4.0–10.5)
nRBC: 0 % (ref 0.0–0.2)

## 2019-09-23 LAB — PHOSPHORUS: Phosphorus: 3.3 mg/dL (ref 2.5–4.6)

## 2019-09-23 MED ORDER — FREE WATER
400.0000 mL | Freq: Four times a day (QID) | Status: DC
  Administered 2019-09-23 – 2019-09-25 (×8): 400 mL

## 2019-09-23 NOTE — Evaluation (Signed)
Physical Therapy Evaluation Patient Details Name: Charlotte [name] MRN: [id number] DOB: 1928/12/10 Today's Date: 09/23/2019   History of Present Illness  85 year female with hx breast cancer, diastolic heart failure and anxiety admitted 09/08/2019 for acute hypoxemic respiratory failure and atypical pna. During this hospital course she has intermittently required BiPAP. Palliative consulted and pt's spouse continues to want intubation if needed.   Clinical Impression   Pt admitted with above diagnosis. Patient has had prolonged hospitalization and become very weak. She is very willing to participate, however tolerated only 3 minutes sitting EOB with min assist for balance. Pt was on BiPap with Sats 97-100% throughout session, RR 30-38 with max 47 and returned to supine then chair position in bed. Noted Palliative Consult with spouse still wanting all care (including up to intubation if needed). Anticipate very slow progress with potential need for LTACH, and at a minimum SNF. Pt currently with functional limitations due to the deficits listed below (see PT Problem List). Pt will benefit from skilled PT to increase their independence and safety with mobility to allow discharge to the venue listed below.       Follow Up Recommendations SNF (not sure if family will support this) will continue to assess;Supervision/Assistance - 24 hour    Equipment Recommendations  None recommended by PT    Recommendations for Other Services OT consult     Precautions / Restrictions Precautions Precautions: Fall;Other (comment) Precaution Comments: multiple lines/tubes; seen on biPap Restrictions Weight Bearing Restrictions: No      Mobility  Bed Mobility Overal bed mobility: Needs Assistance Bed Mobility: Rolling;Sidelying to Sit;Sit to Sidelying Rolling: Min assist;Mod assist Sidelying to sit: Total assist;+2 for safety/equipment     Sit to sidelying: Total assist;+2 for safety/equipment General bed  mobility comments: Pt initiated rolling to her rt for OOB; after return to supine, rolling to change linens requiring incr assist  Transfers                 General transfer comment: NT due to RR up to 45-47   Ambulation/Gait                Stairs            Wheelchair Mobility    Modified Rankin (Stroke Patients Only)       Balance Overall balance assessment: Needs assistance Sitting-balance support: No upper extremity supported;Feet unsupported Sitting balance-Leahy Scale: Poor Sitting balance - Comments: pt required min-guard to min assist at edge of ICU bed x 3 minutes                                     Pertinent Vitals/Pain Pain Assessment: No/denies pain    Home Living Family/patient expects to be discharged to:: Unsure Living Arrangements: Spouse/significant other               Additional Comments: pt RR too high and on Bipap--could not complete history/interview    Prior Function           Comments: unclear     Hand Dominance        Extremity/Trunk Assessment   Upper Extremity Assessment Upper Extremity Assessment: Defer to OT evaluation(lifted arms x 1 to touch top of her head with effort)    Lower Extremity Assessment Lower Extremity Assessment: Generalized weakness(AAROM WFL; hip flex <2+; hip/knee ext in supine 2+)    Cervical / Trunk Assessment  Cervical / Trunk Assessment: Other exceptions Cervical / Trunk Exceptions: cachectic  Communication   Communication: Other (comment)(difficult to understand pt due to biPap mask)  Cognition Arousal/Alertness: Awake/alert Behavior During Therapy: WFL for tasks assessed/performed Overall Cognitive Status: Difficult to assess                                        General Comments General comments (skin integrity, edema, etc.): pt cooperative and very weak; currently most safe for pt to use a lift for OOB (?try Steady)    Exercises      Assessment/Plan    PT Assessment Patient needs continued PT services  PT Problem List Decreased strength;Decreased activity tolerance;Decreased balance;Decreased mobility;Decreased knowledge of use of DME;Cardiopulmonary status limiting activity       PT Treatment Interventions DME instruction;Gait training;Functional mobility training;Therapeutic activities;Therapeutic exercise;Balance training;Patient/family education    PT Goals (Current goals can be found in the Care Plan section)  Acute Rehab PT Goals Patient Stated Goal: unable due to bipap PT Goal Formulation: Patient unable to participate in goal setting Time For Goal Achievement: 10/07/19 Potential to Achieve Goals: Fair    Frequency Min 2X/week   Barriers to discharge        Co-evaluation               AM-PAC PT "6 Clicks" Mobility  Outcome Measure Help needed turning from your back to your side while in a flat bed without using bedrails?: A Little Help needed moving from lying on your back to sitting on the side of a flat bed without using bedrails?: Total Help needed moving to and from a bed to a chair (including a wheelchair)?: Total Help needed standing up from a chair using your arms (e.g., wheelchair or bedside chair)?: Total Help needed to walk in hospital room?: Total Help needed climbing 3-5 steps with a railing? : Total 6 Click Score: 8    End of Session Equipment Utilized During Treatment: Oxygen Activity Tolerance: Patient limited by fatigue Patient left: in bed;with call bell/phone within reach;with bed alarm set Nurse Communication: Mobility status PT Visit Diagnosis: Muscle weakness (generalized) (M62.81)    Time: [id number] PT Time Calculation (min) (ACUTE ONLY): 22 min   Charges:   PT Evaluation $PT Eval Moderate Complexity: 1 Mod            [employer], PT      [place] P Jesicca Dipierro 09/23/2019, 10:20 AM

## 2019-09-23 NOTE — Progress Notes (Signed)
Per RN, she placed pt on bipap earlier.  Pt appears to be tol well currently, RR was 44, now 37.  No distress noted, pt appears to be resting currently.  Sat 98%, HR 86

## 2019-09-23 NOTE — Progress Notes (Signed)
Came to room d/t RN placing pt on bipap d/t physical therapy getting ready to work w/ pt.

## 2019-09-23 NOTE — Progress Notes (Signed)
Pt switched to HFNC, pt wants a "Break" from bipap.  Pt states her breathing is "ok" currently on HFNC.  VSS, sat 98-100%, HR 82, RR 20.

## 2019-09-23 NOTE — Progress Notes (Signed)
NAME:  Charlotte [name], MRN:  [id number], DOB:  [birth year], LOS: 5 ADMISSION DATE:  09/20/2019, CONSULTATION DATE:  9/23 REFERRING MD:  Charlotte [name], CHIEF COMPLAINT:  Acute respiratory failure   Brief History   85 year old female admitted 9/19 w/ working dx of CAP/atypical PNA. Placed on azith and rocephin. COVID neg. Supported on supplemental oxgyen, given IV lasix and in spite of therapy continued to decline to point she required 100% FI02 on BIPAP w/ rapid desaturation to 70s when bipap seal broken. PCCM asked to see 9/23  History of present illness   This is a pleasant 85 year old female patient who was admitted on 9/19 with chief complaint of worsening shortness of breath, left lateral/lower back/chest discomfort, non-productive cough and possible fever. Denied HA, sore throat, sick exposure, loss of taste or smell. No sig swelling no orthopnea not waking up short of breath.  In ED CT w/ bilateral R>L multilobar GG airspace disease chronic contraction BTX left anterior chest and small L>R effusions. BNP was neg, inflammatory markers including CRP, LDH and ferritin all elevated. COVID negative.    Hospital course  9/19 Admitted w/ working dx of atypical PNA, placed on azith and rocephin and provided supplemental oxygen. Placed on respiratory isolation until repeat COVID returned 9/20 thru 9/23 progressive hypoxia. Repeat COVID neg. Diuresed 9/22 w/ concern that may be component of pulmonary edema. Later placed on BIPAP that evening when saturations could not be maintained above 90% even on heated high flow. PCCM asked to see the am 9/23 for progressive respiratory failure  9/26: Slowly improving  9/28: placed back on BiPAP overnight for tachypnea.   Significant Hospital Events   9/23 -24-bipap 9/28 on bipap from heated high flow   Consults:  9/24 CCM  Procedures:    Significant Diagnostic Tests:   CT chest 9/19: 1. There is no evidence for pulmonary embolus. 2. There are areas of  ground-glass opacification primarily within the right upper lobe, right middle lobe and bilateral lower lobes. These areas demonstrate interlobular septal thickening. These findings are nonspecific and may be related to pulmonary edema, acute interstitial pneumonia, drug toxicity, versus less likely diffuse alveolar hemorrhage. 3. Trace bilateral pleural effusions, left greater than right. 4. Redemonstration of scarring and traction bronchiectasis within the lingula presumably related to the patient's reported history of breast radiation.  ECHO 9/23: LVEF 66-29%, some diastolic dysfunction, RVSP 59.7  Serologies 9/23: ANA with reflex:  ANA, IFA: neg ANCA titer:neg Anti--DNA antibody, double-stranded: neg  Anti-- J01 antibody, IgG neg Anti--scleredema antibody: neg Cyclic citrulline peptide antibody, IgG- IgA: MPO/PR-3 antibodies: Sed rate: + 112 Rheumatoid factor: +30 Sjogren's syndrome-a: Sjogren's syndrome-:B:   CXR 10/1: Improving bilateral interstitial opacities  Micro Data:  COVID -19 9/20: neg  Urine strep antigen 9/23: negative Urine Legionella antigen 9/23: neg Respiratory viral panel on 9/23: negative  blood cultures 09/15/2019- negative  Antimicrobials:  Rocephin 9/19-> 9/25 Azithromycin 9/19->9/24  Interim history/subjective:  Charlotte [name] is an 85 year old woman with a history of breast cancer, with recurrence, radiation fibrosis, who presents with acute hypoxic respiratory failure due to pneumonitis. She has previously been treated for and given high-dose steroids, with very slow improvement in her respiratory function.  He denies complaints this morning.  Was able to work with PT this morning while on BiPAP, but previously been limited due to significant drops in saturations.   Objective   Blood pressure (!) 145/64, pulse 78, temperature 98.5 F (36.9 C), temperature source Axillary, resp. rate (!) 28,  height 5' 3"  (1.6 m), weight 61.2 kg, SpO2 98 %.    FiO2 (%):   50% on BiPAP  Intake/Output Summary (Last 24 hours) at 09/14/2019 0753 Last data filed at 09/14/2019 0600 Gross per 24 hour  Intake 1270.65 ml  Output 550 ml  Net 720.65 ml  For admission +840 cc  Filed Weights   09/01/2019 1625  Weight: 61.2 kg    Examination: General: Elderly woman lying in bed no acute distress, very frail and weak. Neuro: Alert, attempting answer questions, but difficult to understand due to BiPAP.  Globally weak, but moving all extremities spontaneously. HEENT: Normocephalic, atraumatic.  Anicteric sclera Cardiovascular: Regular rate and rhythm, no murmurs Lungs: Wheezing comfortably on BiPAP with volumes around 450, 50% FiO2.  Rales bilaterally, left greater than right. Abdomen: Soft, nontender, nondistended Musculoskeletal: No peripheral edema Skin: Warm and dry, no rashes   Assessment & Plan:   Acute hypoxic respiratory failure. 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  Suspect she is acute pneumonitis, potentially due to post viral (although respiratory viral panel was negative) or drug-induced 2/2 Arimidex.  Serologies negative, suggesting against an autoimmune etiology. -Deviously treated with 7 days of antibiotics for CAP -Unlikely due to a pulmonary edema, goal is to maintain euvolemia -there are case reports of people developing autoimmune processes on Arimidex long-term. -Anticipate a very prolonged recovery, with significant oxygen requirements.  Presenting, behaving and recovering like an acute ILD or exacerbation of underlying disease.  Unclear prognosis. Plan -Continue HFNC today as able for SpO2 > 88%, will be slow to wean.  BiPAP overnight given history of OSA. -Given her guarded prognosis, intubation only if in significant respiratory distress.  Anticipate that if she were to require intubation, palliative efforts would be most appropriate, and if the family chose  aggressive therapy, she would require prolonged mechanical ventilation. -Aggressive PT and OT-anticipate that she will be best with this on BiPAP.  Increased oxygen as required to maintain appropriate sats to facilitate rehabilitation.-Continue steroids, taper prescribed- 40m x 3 days then 30 then 20 then 10.    HTN-well-controlled -Not able to take POs while on BiPAP  Plan -Holding PTA antihypertensives -Labetalol as needed  AKI - resolved Plan -Measure I/O, maintain euvolemia -Avoid nephrotoxic meds  Hypernatremia -worsened -likely hypovolemic  Plan -Continue free water per tube, increasing to 400 cc every 6 hours -Tinea to monitor -Hold hygroton (home med hygroton [id number])  Depression Plan -Continue PTA Zoloft  Debilitation  Inadequate PO intake Plan -Continue supplemental nutrition -Continue PT and OT, who have been notified to increase oxygen as much as required to facilitate rehabilitation   Hyperglycemia - exacerbated by D5W and steroids Plan -Accu-Cheks every 4 hours with supplemental insulin as required -Goal BG 100 4180 while admitted to the ICU  Best practice:  Diet: TF's Pain/Anxiety/Delirium protocol (if indicated): Not applicable VAP protocol (if indicated): Not applicable DVT prophylaxis: [employer] heparin  GI prophylaxis: NA Glucose control: SSI Mobility: BR Code Status: Limited Code: DNR if sustains cardiac or respiratory arrest. However, if respiratory status declines, she would like short term mechanical vent support  Family Communication: Palliative care met and updated with husband 9/29 Disposition: ICU  This patient is critically ill with multiple organ system failure which requires frequent high complexity decision making, assessment, support, evaluation, and titration of therapies. This was completed through the application of advanced monitoring technologies and extensive interpretation of multiple databases. During this encounter critical care  time  was devoted to patient care services described in this note for 35 minutes.  Julian Hy, DO 09/23/19 10:29 AM [facility]

## 2019-09-24 DIAGNOSIS — E870 Hyperosmolality and hypernatremia: Secondary | ICD-10-CM

## 2019-09-24 DIAGNOSIS — R410 Disorientation, unspecified: Secondary | ICD-10-CM

## 2019-09-24 DIAGNOSIS — N179 Acute kidney failure, unspecified: Secondary | ICD-10-CM

## 2019-09-24 DIAGNOSIS — I10 Essential (primary) hypertension: Secondary | ICD-10-CM

## 2019-09-24 DIAGNOSIS — R531 Weakness: Secondary | ICD-10-CM

## 2019-09-24 LAB — BASIC METABOLIC PANEL
Anion gap: 10 (ref 5–15)
BUN: 55 mg/dL — ABNORMAL HIGH (ref 8–23)
CO2: 33 mmol/L — ABNORMAL HIGH (ref 22–32)
Calcium: 9.3 mg/dL (ref 8.9–10.3)
Chloride: 107 mmol/L (ref 98–111)
Creatinine, Ser: 0.63 mg/dL (ref 0.44–1.00)
GFR calc Af Amer: >60 mL/min (ref >60)
GFR calc non Af Amer: >60 mL/min (ref >60)
Glucose, Bld: 187 mg/dL — ABNORMAL HIGH (ref 70–99)
Potassium: 4.8 mmol/L (ref 3.5–5.1)
Sodium: 150 mmol/L — ABNORMAL HIGH (ref 135–145)

## 2019-09-24 LAB — GLUCOSE, CAPILLARY
Glucose-Capillary: 149 mg/dL — ABNORMAL HIGH (ref 70–99)
Glucose-Capillary: 154 mg/dL — ABNORMAL HIGH (ref 70–99)
Glucose-Capillary: 166 mg/dL — ABNORMAL HIGH (ref 70–99)
Glucose-Capillary: 167 mg/dL — ABNORMAL HIGH (ref 70–99)
Glucose-Capillary: 184 mg/dL — ABNORMAL HIGH (ref 70–99)
Glucose-Capillary: 204 mg/dL — ABNORMAL HIGH (ref 70–99)

## 2019-09-24 MED ORDER — CHLORHEXIDINE GLUCONATE 0.12% ORAL RINSE (MEDLINE KIT)
15.0000 mL | Freq: Two times a day (BID) | OROMUCOSAL | Status: DC
  Administered 2019-09-24 – 2019-09-25 (×3): 15 mL via OROMUCOSAL

## 2019-09-24 MED ORDER — METHYLPREDNISOLONE SODIUM SUCC 40 MG IJ SOLR
40.0000 mg | Freq: Three times a day (TID) | INTRAMUSCULAR | Status: DC
  Administered 2019-09-24 – 2019-09-25 (×2): 40 mg via INTRAVENOUS
  Filled 2019-09-24 (×2): qty 1

## 2019-09-24 MED ORDER — ORAL CARE MOUTH RINSE
15.0000 mL | OROMUCOSAL | Status: DC
  Administered 2019-09-24 – 2019-09-25 (×12): 15 mL via OROMUCOSAL

## 2019-09-24 NOTE — Progress Notes (Signed)
Pt RR climbed into the 50's. This RN and RT, Reuben Likes, went into room and coached pt to take slow, deep breaths. Pt would slow RR while being coached, but would breath faster as soon as the coaching stopped. Pt claims to not have any pain, no anxiety, and does not feel short of breath. Elink made aware. Will continue to assess.

## 2019-09-24 NOTE — Progress Notes (Signed)
eLink Physician-Brief Progress Note Patient Name: Charlotte [name] DOB: 01/05/1928 MRN: [id number]   Date of Service  09/24/2019  HPI/Events of Note  Nursing concerned about RR in high 20's to mid 30's. Video assessment: patient on BiPAP. HR climbing to 110 of concern.   eICU Interventions  Will ask ground team to assess patient at bedside.      Intervention Category Major Interventions: Other:  Lysle Dingwall 09/24/2019, 9:50 PM

## 2019-09-24 NOTE — Progress Notes (Signed)
Patient ID: Charlotte [name], female   DOB: [birth year], 85 y.o.   MRN: [id number]  This NP visited patient at bedside for palliative medicine needs and emotional support.  Patient's husband and granddaughter/[name] at bedside.     Patient is lethargic and currently on BiPAP.  She is been unable to contain her O2 saturations off of BiPAP for the last 12 to 15 hours.  Dr. Carlis Abbott is at bedside updating family on current medical situation.  I had continued conversation with Mr. Carlis Abbott and his granddaughter.  We discussed the seriousness of the patient's current medical situation.    We discussed the limitations of medical interventions to prolong quality of life when the body begins to fail to thrive.  They remain hopeful for improvement but understand the seriousness of the situation and importance of considering decisions if patient decompensates  Again addressed recommendation for DNI,  knowing poor outcomes in similar patients.  Mr. shalisha yost that he continues to wish that the patient be intubated if the need arises   Family will continue to assess the situation, they need more time before making any decisions regarding de-escalation of care and pursuing a comfort path.  For now continue current treatment plan and hope for improvement.    In order to have entire family participate in a conversation together the plan is to have a conference call tomorrow afternoon.  I will organize that meeting once family lets me know a timeframe.  Emotional support offered.  PMT will continue to support holistically  Questions and concerns addressed   Discussed with bedside nursing and Dr Carlis Abbott  Total time  45 minutes  Greater than 50% of the time was spent in counseling and coordination of care  Wadie Lessen NP  Palliative Medicine Team Team Phone # [phone] Pager [phone]

## 2019-09-24 NOTE — Progress Notes (Addendum)
Called to bedside to eval d/t increased WOB, though pt denies SOB/difficulty breathing. Concern for fatigue with tachypnea, and anxiety, patient apparently calms down with redirection and instruction to decrease her respiratory rate.  She has good tidal volumes and oxygenation, on AVAPS 60%  FiO2.  On exam she appears comfortable, lungs are clear, full, nonlabored.  I contacted her husband by telephone to update him.  Revisited and discussed conversation with palliative care today and that he and the patient and family are agreeable to short-term intubation.  I discussed that the patient has been in the hospital for 15 days and in the setting of her acute on chronic illnesses that if she were to indeed require breathing tube and mechanical ventilation we would likely be unable to wean her from this.  He states that there is a family meeting scheduled for tomorrow.  I advised him that it is possible the patient could decompensate prior to that and unable to wait and could potentially need to be intubated tonight.  He states he is okay with that.  At this time the patient appears comfortable.  Will change p.o. prednisone to IV Solu-Medrol.  We will continue to hold off with intubation as long as possible.  Discussed with Dr. Patsey Berthold.

## 2019-09-24 NOTE — Progress Notes (Addendum)
NAME:  Charlotte [name], MRN:  [id number], DOB:  1928/03/28, LOS: 5 ADMISSION DATE:  09/15/2019, CONSULTATION DATE:  9/23 REFERRING MD:  Andres Labrum, CHIEF COMPLAINT:  Acute respiratory failure   Brief History   85 year old female admitted 9/19 w/ working dx of CAP/atypical PNA. Placed on azith and rocephin. COVID neg. Supported on supplemental oxgyen, given IV lasix and in spite of therapy continued to decline to point she required 100% FI02 on BIPAP w/ rapid desaturation to 70s when bipap seal broken. PCCM asked to see 9/23  History of present illness   This is a pleasant 85 year old female patient who was admitted on 9/19 with chief complaint of worsening shortness of breath, left lateral/lower back/chest discomfort, non-productive cough and possible fever. Denied HA, sore throat, sick exposure, loss of taste or smell. No sig swelling no orthopnea not waking up short of breath.  In ED CT w/ bilateral R>L multilobar GG airspace disease chronic contraction BTX left anterior chest and small L>R effusions. BNP was neg, inflammatory markers including CRP, LDH and ferritin all elevated. COVID negative.    Hospital course  9/19 Admitted w/ working dx of atypical PNA, placed on azith and rocephin and provided supplemental oxygen. Placed on respiratory isolation until repeat COVID returned 9/20 thru 9/23 progressive hypoxia. Repeat COVID neg. Diuresed 9/22 w/ concern that may be component of pulmonary edema. Later placed on BIPAP that evening when saturations could not be maintained above 90% even on heated high flow. PCCM asked to see the am 9/23 for progressive respiratory failure  9/26: Slowly improving  9/28: placed back on BiPAP overnight for tachypnea.   Significant Hospital Events   9/23 -24-bipap 9/28 on bipap from heated high flow   Consults:  9/24 CCM  Procedures:    Significant Diagnostic Tests:   CT chest 9/19: 1. There is no evidence for pulmonary embolus. 2. There are areas of  ground-glass opacification primarily within the right upper lobe, right middle lobe and bilateral lower lobes. These areas demonstrate interlobular septal thickening. These findings are nonspecific and may be related to pulmonary edema, acute interstitial pneumonia, drug toxicity, versus less likely diffuse alveolar hemorrhage. 3. Trace bilateral pleural effusions, left greater than right. 4. Redemonstration of scarring and traction bronchiectasis within the lingula presumably related to the patient's reported history of breast radiation.  ECHO 9/23: LVEF 20-35%, some diastolic dysfunction, RVSP 59.7  Serologies 9/23: ANA with reflex:  ANA, IFA: neg ANCA titer:neg Anti--DNA antibody, double-stranded: neg  Anti-- J01 antibody, IgG neg Anti--scleredema antibody: neg Cyclic citrulline peptide antibody, IgG- IgA: MPO/PR-3 antibodies: Sed rate: + 112 Rheumatoid factor: +30 Sjogren's syndrome-a: Sjogren's syndrome-:B:   CXR 10/1: Improving bilateral interstitial opacities  Micro Data:  COVID -19 9/20: neg  Urine strep antigen 9/23: negative Urine Legionella antigen 9/23: neg Respiratory viral panel on 9/23: negative  blood cultures 09/01/2019- negative Urine 09/23/2019 >>   Antimicrobials:  Rocephin 9/19-> 9/25 Azithromycin 9/19->9/24  Interim history/subjective:  Overnight no acute events.  Since yesterday her nurses report that she has been more sedated and less interactive.  Was able to work with physical therapy yesterday on BiPAP.  Objective   Blood pressure (!) 145/64, pulse 78, temperature 98.5 F (36.9 C), temperature source Axillary, resp. rate (!) 28, height 5' 3"  (1.6 m), weight 61.2 kg, SpO2 98 %.    FiO2 (%):  50% on BiPAP  Intake/Output Summary (Last 24 hours) at 09/14/2019 0753 Last data filed at 09/14/2019 0600 Gross per 24 hour  Intake 1270.65 ml  Output 550 ml  Net 720.65 ml  For admission +1.2L    Examination: General: Frail appearing elderly woman lying  in bed on BiPAP, sleepy, but answering questions Neuro: Easy to awaken, alert and able to answer questions, globally weak, but able to move all extremities on command. HEENT: Normocephalic, atraumatic, anicteric sclera  cardiovascular: Regular rate and rhythm, no murmurs Lungs: Breathing comfortably on BiPAP with tidal lines around 450cc.  Bilateral rales worse on the left. abdomen: Soft, nontender, nondistended Musculoskeletal: No peripheral edema Skin: Warm and dry, no rashes or wounds.  Mepilex on her nose.   Assessment & Plan:   Acute hypoxic respiratory failure. 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  Suspect she is acute pneumonitis, potentially due to post viral (although respiratory viral panel was negative) or drug-induced 2/2 Arimidex.  Serologies negative, suggesting against an autoimmune etiology. -Previously treated with azithromycin and ceftriaxone for 7 days -Unlikely pulmonary edema.  -Anticipate a very prolonged recovery, with significant oxygen requirements.  Presenting, behaving and recovering like an acute ILD or exacerbation of underlying disease.  Unclear prognosis. Plan -Continue high flow nasal cannula during the day plus BiPAP PRN.  Would like to promote mobility as much as possible.  Increase supplemental oxygen support as much as needed to facilitate mobility. -Titrate supplemental oxygen to maintain saturations greater than 88%. -With her very guarded prognosis, would only intubate for severe respiratory distress.-The family is aware of the likely very prolonged recovery and high risk for decompensation. -Maintain euvolemia. -Aggressive PT and OT-anticipate that she will be best with this on BiPAP.  Increased oxygen as required to maintain appropriate sats to facilitate rehabilitation. - Continue steroid taper-28m x 3 days then 30 then 20 then 10.    HTN- well controlled -Not  able to take POs while on BiPAP  Plan -Holding PTA antihypertensives -Labetalol as needed  AKI - resolved Plan -Avoid nephrotoxic meds -Maintain euvolemia - Measure I/O  Hypernatremia -worsened.  Possibly hypovolemic. Plan -Continue free water flushes, 400 cc every 6 hours -BMP pending -Continue to monitor - Home chlorthalidone held   Depression Plan -Continue home Zoloft  Debilitation  Inadequate PO intake Plan -Continue tube feeds -Continue aggressive rehabilitation efforts.  Will get patient up in the chair today -Continue PT and OT  Hyperglycemia -improving as steroids decrease Plan -Continue Accu-Cheks every 4 hours with sliding scale insulin as needed -Goal BG 140-180 while in the ICU if requiring insulin  Best practice:  Diet: TF's Pain/Anxiety/Delirium protocol (if indicated): Not applicable VAP protocol (if indicated): Not applicable DVT prophylaxis: [place] heparin  GI prophylaxis: NA Glucose control: SSI Mobility: BR Code Status: Limited Code: DNR if sustains cardiac or respiratory arrest. However, if respiratory status declines, she would like short term mechanical vent support  Family Communication: Palliative care met and updated with husband 9/29 Disposition: ICU  This patient is critically ill with multiple organ system failure which requires frequent high complexity decision making, assessment, support, evaluation, and titration of therapies. This was completed through the application of advanced monitoring technologies and extensive interpretation of multiple databases. During this encounter critical care time was devoted to patient care services described in this note for 35 minutes.  LJulian Hy DO 09/24/19 9:42 AM [facility]       I had a lengthy discussion MStanton Kidneyfrom Palliative Care and Charlotte [name]'s husband and her granddaughter, who is a nMarine scientistregarding her  ongoing care, prognosis, and end-of-life care wishes.  They  are still struggling to process everything that is going on, but they understand the severity of her illness, her very slow recovery and therefore guarded prognosis, and that she is likely to accumulate complications throughout her prolonged recovery.  They are planning to have a virtual meeting in the next day or 2 with Stanton Kidney and any available family members.  Her husband remains reluctant to limit escalation of care. They are aware that we are concerned she would be unable to wean from mechanical ventilation given the severity of her lung injury.  Additional 45 minutes of critical care time provided.  Julian Hy, DO 09/24/19 2:02 PM [facility]

## 2019-09-25 ENCOUNTER — Inpatient Hospital Stay (HOSPITAL_COMMUNITY): Payer: Medicare Other

## 2019-09-25 LAB — CBC
HCT: 14.3 % — ABNORMAL LOW (ref 36.0–46.0)
Hemoglobin: 4.4 g/dL — CL (ref 12.0–15.0)
MCH: 29.9 pg (ref 26.0–34.0)
MCHC: 30.8 g/dL (ref 30.0–36.0)
MCV: 97.3 fL (ref 80.0–100.0)
Platelets: 305 10*3/uL (ref 150–400)
RBC: 1.47 MIL/uL — ABNORMAL LOW (ref 3.87–5.11)
RDW: 17.7 % — ABNORMAL HIGH (ref 11.5–15.5)
WBC: 20.9 10*3/uL — ABNORMAL HIGH (ref 4.0–10.5)
nRBC: 2.3 % — ABNORMAL HIGH (ref 0.0–0.2)

## 2019-09-25 LAB — GLUCOSE, CAPILLARY
Glucose-Capillary: 206 mg/dL — ABNORMAL HIGH (ref 70–99)
Glucose-Capillary: 171 mg/dL — ABNORMAL HIGH (ref 70–99)
Glucose-Capillary: 180 mg/dL — ABNORMAL HIGH (ref 70–99)

## 2019-09-25 LAB — BASIC METABOLIC PANEL
Anion gap: 8 (ref 5–15)
BUN: 75 mg/dL — ABNORMAL HIGH (ref 8–23)
CO2: 34 mmol/L — ABNORMAL HIGH (ref 22–32)
Calcium: 9.2 mg/dL (ref 8.9–10.3)
Chloride: 103 mmol/L (ref 98–111)
Creatinine, Ser: 0.88 mg/dL (ref 0.44–1.00)
GFR calc Af Amer: >60 mL/min (ref >60)
GFR calc non Af Amer: 58 mL/min — ABNORMAL LOW (ref >60)
Glucose, Bld: 210 mg/dL — ABNORMAL HIGH (ref 70–99)
Potassium: 4.8 mmol/L (ref 3.5–5.1)
Sodium: 145 mmol/L (ref 135–145)

## 2019-09-25 LAB — URINE CULTURE: Culture: <10000 — AB

## 2019-09-27 ENCOUNTER — Telehealth: Payer: Self-pay

## 2019-09-27 NOTE — Telephone Encounter (Signed)
Received dc from [employer].  DC is for burial and a patient of Doctor Sood.   DC will be taken to [facility] [id number] for signature.  On 09/28/2019 Received signed dc back from Doctor Halford Chessman, I called the funeral home to let them know the dc is ready for pickup.

## 2019-10-03 ENCOUNTER — Ambulatory Visit: Payer: Medicare Other | Admitting: Neurology

## 2019-10-05 ENCOUNTER — Ambulatory Visit: Payer: Self-pay | Admitting: Hematology and Oncology

## 2019-10-22 NOTE — Progress Notes (Signed)
NAME:  Charlotte [name], MRN:  [id number], DOB:  03-15-1928, LOS: 5 ADMISSION DATE:  08/28/2019, CONSULTATION DATE:  9/23 REFERRING MD:  Andres Labrum, CHIEF COMPLAINT:  Acute respiratory failure   Brief History   85 year old female admitted 9/19 w/ working dx of CAP/atypical PNA. Placed on azith and rocephin. COVID neg. Supported on supplemental oxgyen, given IV lasix and in spite of therapy continued to decline to point she required 100% FI02 on BIPAP w/ rapid desaturation to 70s when bipap seal broken. PCCM asked to see 9/23  Significant Hospital Events   9/23 24-bipap 9/28 on bipap from heated high flow   Consults:  Palliative care  Procedures:    Significant Diagnostic Tests:   CT chest 9/19: 1. There is no evidence for pulmonary embolus. 2. There are areas of ground-glass opacification primarily within the right upper lobe, right middle lobe and bilateral lower lobes. These areas demonstrate interlobular septal thickening. These findings are nonspecific and may be related to pulmonary edema, acute interstitial pneumonia, drug toxicity, versus less likely diffuse alveolar hemorrhage. 3. Trace bilateral pleural effusions, left greater than right. 4. Redemonstration of scarring and traction bronchiectasis within the lingula presumably related to the patient's reported history of breast radiation.  ECHO 9/23: LVEF [id number], some diastolic dysfunction, RVSP 59.7  Serologies 9/23: ANA with reflex:  ANA, IFA: neg ANCA titer:neg Anti--DNA antibody, double-stranded: neg  Anti-- J01 antibody, IgG neg Anti--scleredema antibody: neg Cyclic citrulline peptide antibody, IgG- IgA: MPO/PR-3 antibodies: Sed rate: + 112 Rheumatoid factor: +30 Sjogren's syndrome-a: Sjogren's syndrome-:B:   Micro Data:  COVID -19 9/20: neg  Urine strep antigen 9/23: negative Urine Legionella antigen 9/23: neg Respiratory viral panel on 9/23: negative  blood cultures 08/28/2019- negative Urine 09/23/2019 >>   Antimicrobials:  Rocephin 9/19-> 9/25 Azithromycin 9/19->9/24  Interim history/subjective:  Remains on Bipap and high flow oxygen.    Objective   Blood pressure (!) 145/64, pulse 78, temperature 98.5 F (36.9 C), temperature source Axillary, resp. rate (!) 28, height 5\' 3"  (1.6 m), weight 61.2 kg, SpO2 98 %.    FiO2 (%):  50% on BiPAP  Intake/Output Summary (Last 24 hours) at 09/14/2019 0753 Last data filed at 09/14/2019 0600 Gross per 24 hour  Intake 1270.65 ml  Output 550 ml  Net 720.65 ml  For admission +1.2L    Examination:  General - somnolent Eyes - pupils reactive ENT - Bipap mask on Cardiac - regular rate/rhythm, no murmur Chest - decreased BS, b/l rhonchi Abdomen - soft, non tender, decreased bowel sounds Extremities - 1+ edema Skin - no rashes Neuro - not following commands   Resolved Problems:  AKI from ATN, Hypernatremia   Assessment & Plan:   Acute hypoxic respiratory failure with b/l GGO. 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 - concern for post viral BOOP versus reaction to arimidex; no improvement with ABx or diursis - continue solumedrol - f/u CXR - goal SpO2 > 90% - family okay to continue with Bipap, HF O2 for now - family decided against intubation  Anemia. - no obvious source of bleeding - family opted against blood transfusions. - repeat Hb level to ensure not lab error first - hold SQ heparin, ASA  Hx of HTN. - goal SBP < [id number]  Acute metabolic encephalopathy. Depression. - hold outpt zoloft  Steroid induced hyperglycemia. - d/c SSI with plans to transition to comfort measures  Hx of breast cancer. - hold arimidex  Goals of care. - had  extensive d/w Pt's son and husband over the phone - explained that she continues to get worse in spite of aggressive medical therapies - they have decided not to pursue intubation or blood product transfusions - plan likely to transition to comfort measures; will liberalize  visitation policy in this situation - they understand that she will not survive this hospital stay and don't want her to suffer anymore  Best practice:  Diet: NPO DVT prophylaxis: SCDs GI prophylaxis: Protonix Mobility: bed rest Code Status: Okay with intubation, no CPR/defibrillation Disposition: ICU  Labs:   CMP Latest Ref Rng & Units 2019/10/16 09/24/2019 09/23/2019  Glucose 70 - 99 mg/dL 210(H) 187(H) 149(H)  BUN 8 - 23 mg/dL 75(H) 55(H) 52(H)  Creatinine 0.44 - 1.00 mg/dL 0.88 0.63 0.74  Sodium 135 - 145 mmol/L 145 150(H) 154(H)  Potassium 3.5 - 5.1 mmol/L 4.8 4.8 5.0  Chloride 98 - 111 mmol/L 103 107 110  CO2 22 - 32 mmol/L 34(H) 33(H) 34(H)  Calcium 8.9 - 10.3 mg/dL 9.2 9.3 9.6  Total Protein 6.5 - 8.1 g/dL - - -  Total Bilirubin 0.3 - 1.2 mg/dL - - -  Alkaline Phos 38 - 126 U/L - - -  AST 15 - 41 U/L - - -  ALT 0 - 44 U/L - - -   CBC Latest Ref Rng & Units Oct 16, 2019 09/23/2019 09/22/2019  WBC 4.0 - 10.5 K/uL 20.9(H) 14.0(H) 15.7(H)  Hemoglobin 12.0 - 15.0 g/dL 4.4(LL) 10.2(L) 8.6(L)  Hematocrit 36.0 - 46.0 % 14.3(L) 34.7(L) 28.8(L)  Platelets 150 - 400 K/uL 305 326 371   ABG    Component Value Date/Time   PHART 7.454 (H) 09/21/2019 0309   PCO2ART 49.0 (H) 09/21/2019 0309   PO2ART 66.0 (L) 09/21/2019 0309   HCO3 34.4 (H) 09/21/2019 0309   TCO2 36 (H) 09/21/2019 0309   O2SAT 93.0 09/21/2019 0309   CBG (last 3)  Recent Labs    09/24/19 2341 16-Oct-2019 0348 10-16-2019 0735  GLUCAP 154* 206* 171*    CC time 54 minutes  Chesley Mires, MD [place] 10-16-2019, 8:10 AM

## 2019-10-22 NOTE — Progress Notes (Signed)
Chaplain provided spiritual support and prayer to Mrs. Blakenship' granddaughter and son at her bedside.  They expressed gratitude for chaplain and nurses.  Please page chaplain as needed.

## 2019-10-22 NOTE — Progress Notes (Signed)
Family arrived at bedside.  Patient with progressive bradycardia and hypotension.  Confirmed DNR/DNI status.  Developed asystole on monitor.  Time of death 1:03 pm.  Chaplain at bedside to comfort family.  Chesley Mires, MD [facility] Pulmonary/Critical Care 07-Oct-2019, 1:05 PM

## 2019-10-22 NOTE — Progress Notes (Signed)
Went into pt's room after monitor alarmed HR of 42. SpO2 monitor unable to pick up. Dr. Halford Chessman paged about pt's drop in HR. Pt's family brought into room to be with pt. RT called as BiPap was alarming. Pt's HR continued to drop. Dr. Halford Chessman spoke with pt's family about making her comfortable for her last moments on earth. Verbal order to remove BiPap and transition pt to comfort care. Once BiPap removed, pt's HR went to 0, and respirations ceased. Dr. Halford Chessman notified. Time of death was 1303.

## 2019-10-22 NOTE — Death Summary Note (Signed)
Charlotte [name] was a 85 y.o. female admitted on 09/08/2019 with cough, fever, and dyspnea.  She was found to have b/l pulmonary infiltrates.  COVID negative.  She was started on antibiotics and diuretics.  Respiratory status got worse and started on Bipap.  Concerns for inflammatory pneumonitis and she was started on solumedrol.  Palliative care consulted to assist with goals of care.  Serologic studies were negative.  She developed severe anemia on 27-Sep-2019.  Status discussed with family.  Opted for DNR status and transition to comfort measures.  She expired on 09-27-2019 at 1:03 pm.   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

## 2019-10-22 NOTE — Progress Notes (Signed)
RT NOTE: RT took patient off bipap per MD and family wishes. Family at bedside with patient.

## 2019-10-22 DEATH — deceased

## 2019-12-09 IMAGING — DX DG CHEST 2V
2 series · 2 of 2 positions shown · non-contrast
Comparison: PA and lateral chest x-ray September 09, 2017

CLINICAL DATA: Shortness of breath and weakness. History of breast
malignancy, pulmonary hypertension. The patient was on prednisone
last week for sciatic symptoms.

EXAM:
CHEST - 2 VIEW

[chest pa]
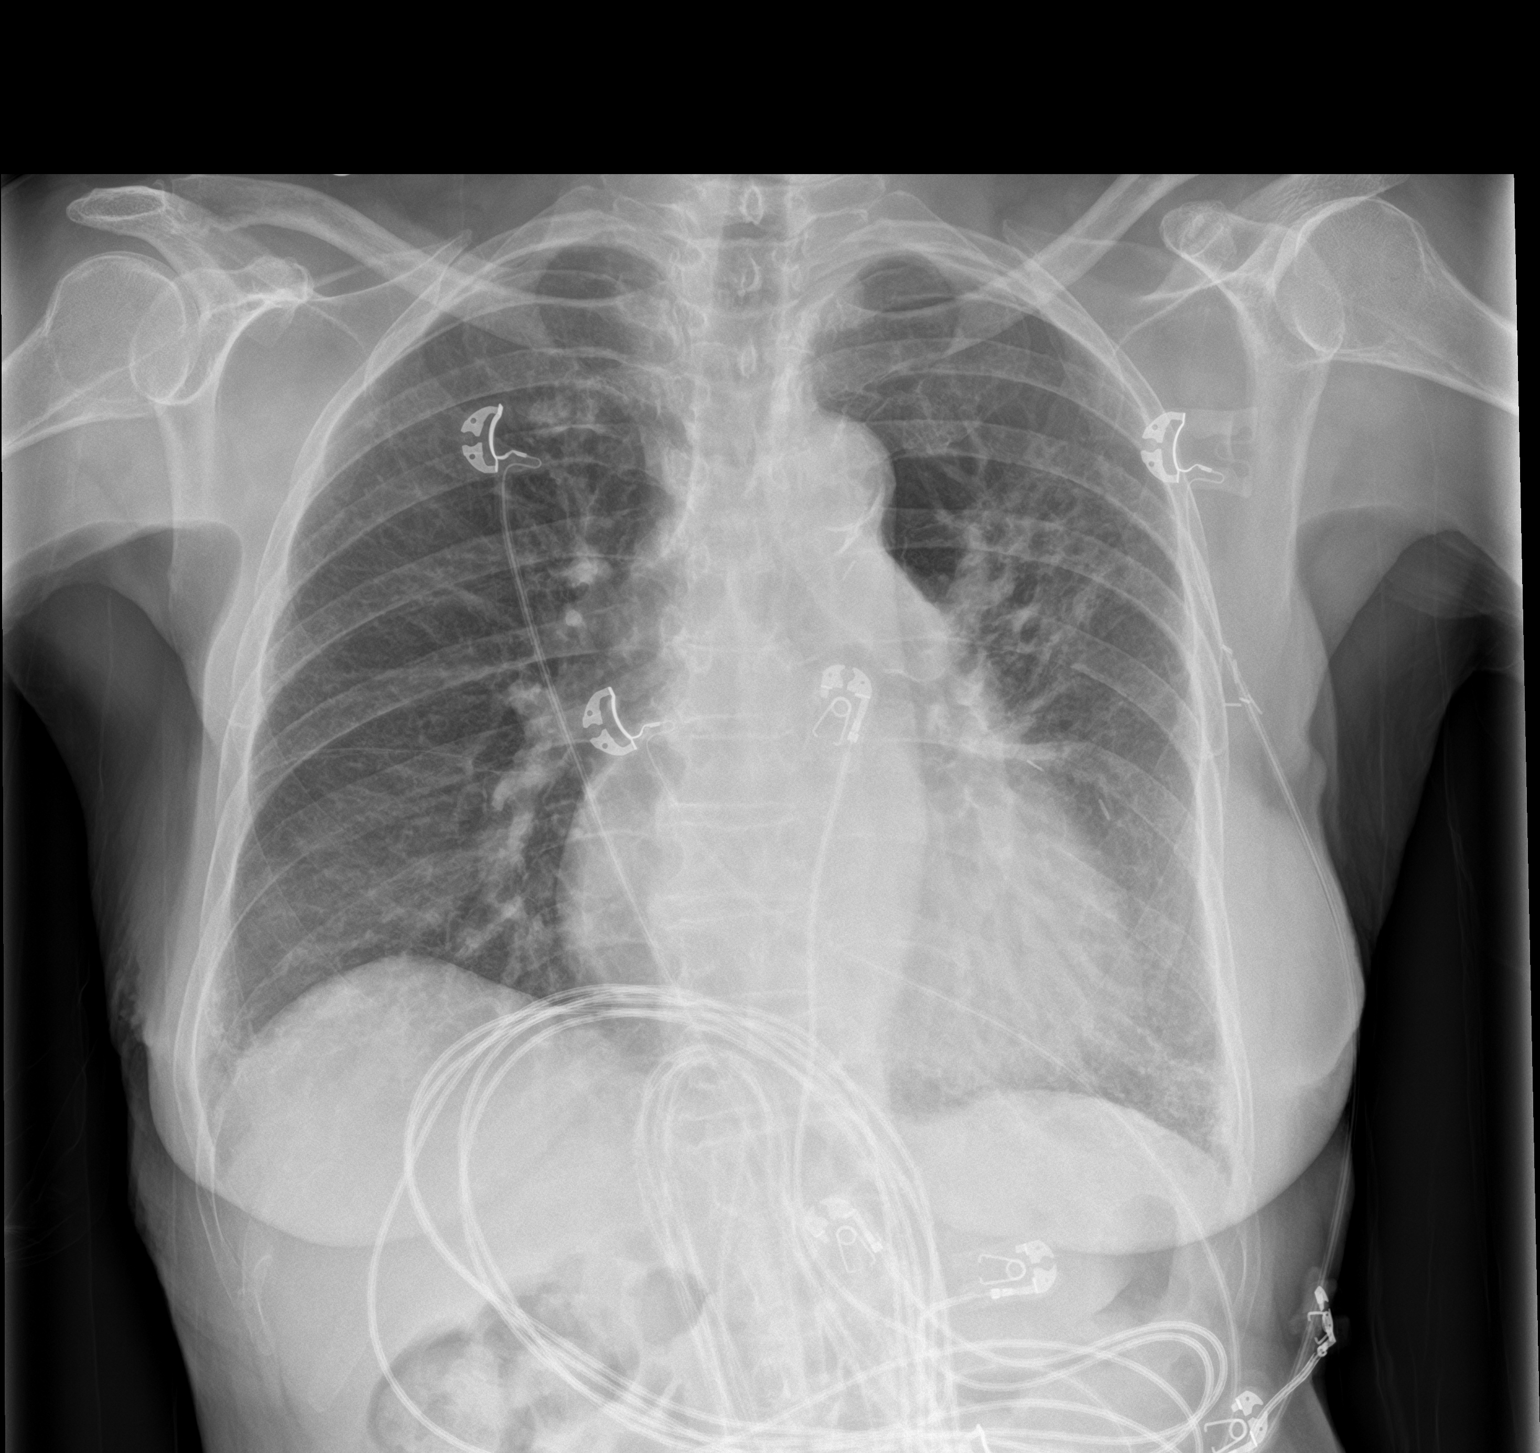

[chest lat]
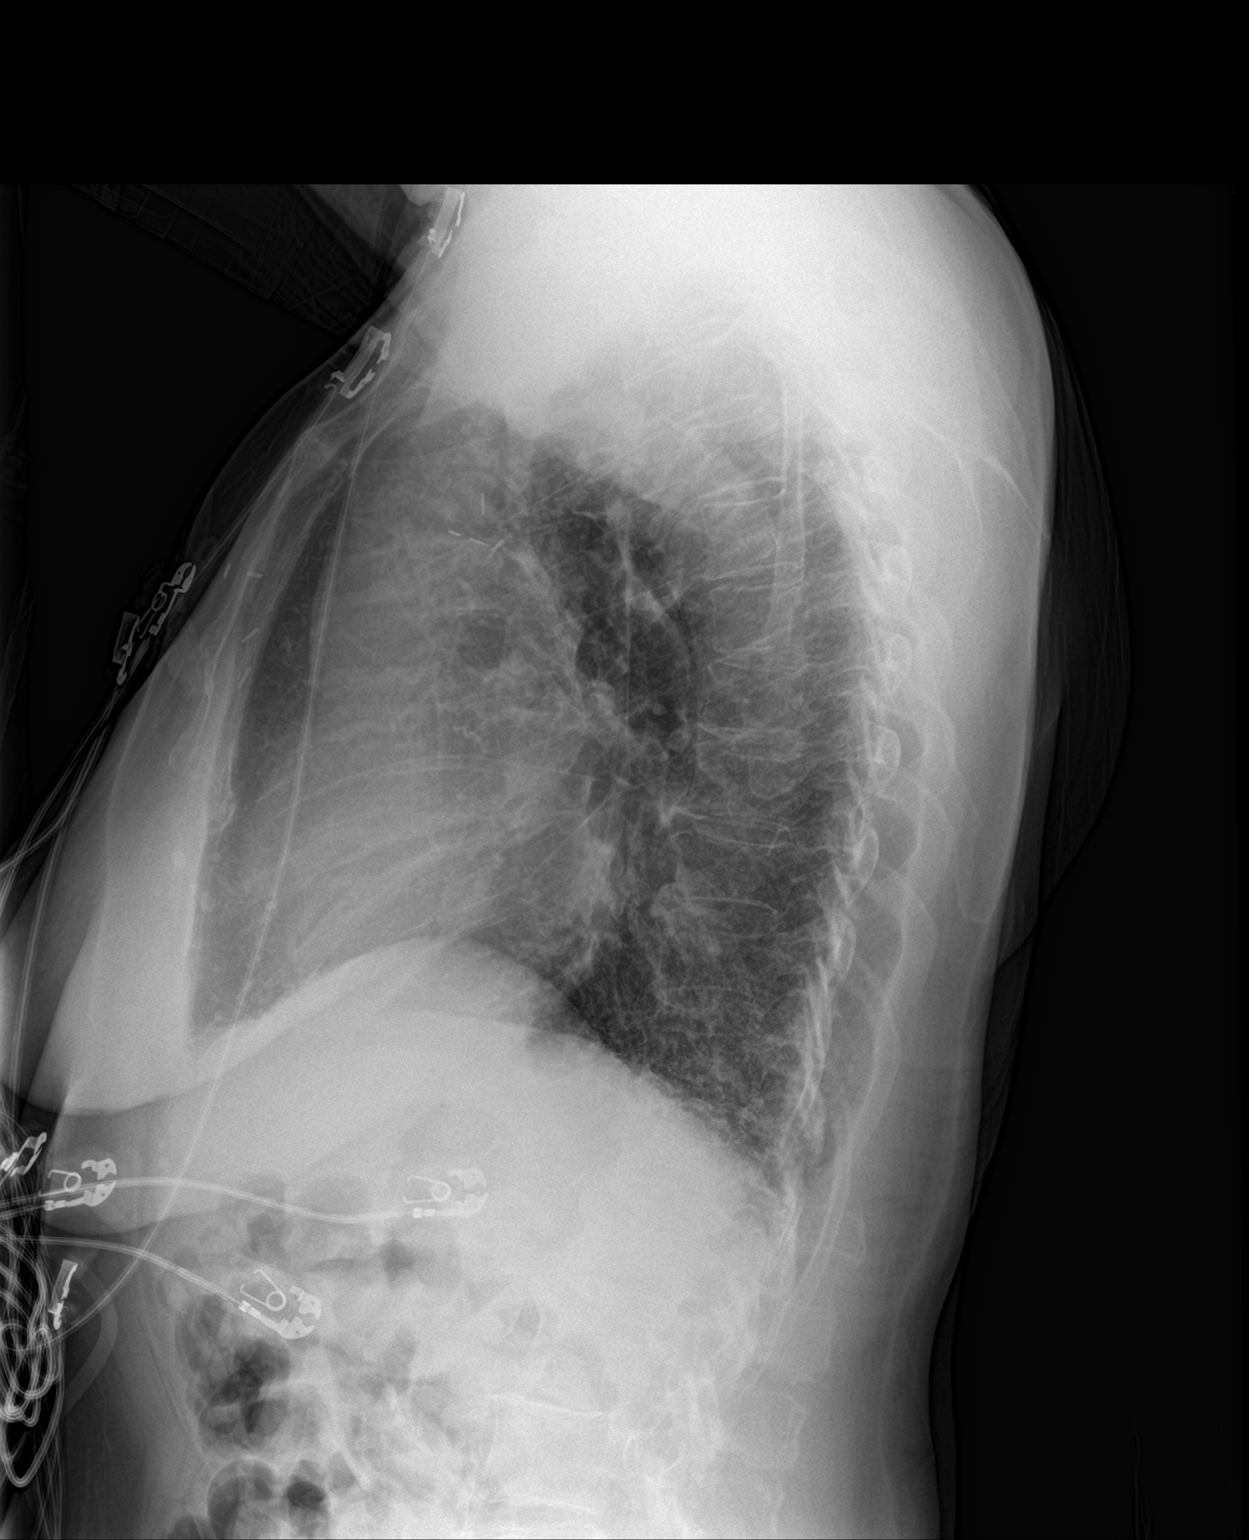

[2 of 2 positions shown; findings below may reference images not displayed]

FINDINGS: The lungs are adequately inflated. There is no acute infiltrate. The
left perihilar fibrotic changes are stable. The heart is top-normal
in size. The pulmonary vascularity is not engorged. There is
calcification in the wall of the aortic arch. The trachea is
midline. The bony thorax exhibits no acute abnormality.
IMPRESSION: Chronic interstitial changes, stable. No acute infiltrate or pleural
effusion.

Stable cardiomegaly without pulmonary edema.

Thoracic aortic atherosclerosis.
